# Patient Record
Sex: Female | Born: 1945 | Race: Black or African American | Hispanic: No | Marital: Single | Smoking: Former smoker
Health system: Southern US, Community
[De-identification: ages and names within clinical notes are randomized; demographics above are authoritative.]

## PROBLEM LIST (undated history)

## (undated) DIAGNOSIS — I739 Peripheral vascular disease, unspecified: Secondary | ICD-10-CM

## (undated) DIAGNOSIS — M869 Osteomyelitis, unspecified: Secondary | ICD-10-CM

## (undated) DIAGNOSIS — I10 Essential (primary) hypertension: Secondary | ICD-10-CM

## (undated) DIAGNOSIS — IMO0002 Reserved for concepts with insufficient information to code with codable children: Secondary | ICD-10-CM

## (undated) DIAGNOSIS — Q782 Osteopetrosis: Secondary | ICD-10-CM

## (undated) DIAGNOSIS — M1990 Unspecified osteoarthritis, unspecified site: Secondary | ICD-10-CM

## (undated) HISTORY — PX: SKIN GRAFT: SHX250

## (undated) HISTORY — PX: TOE AMPUTATION: SHX809

## (undated) HISTORY — PX: EYE SURGERY: SHX253

## (undated) HISTORY — DX: Peripheral vascular disease, unspecified: I73.9

## (undated) HISTORY — PX: JOINT REPLACEMENT: SHX530

## (undated) HISTORY — DX: Reserved for concepts with insufficient information to code with codable children: IMO0002

## (undated) HISTORY — DX: Unspecified osteoarthritis, unspecified site: M19.90

---

## 2011-01-25 DIAGNOSIS — I872 Venous insufficiency (chronic) (peripheral): Secondary | ICD-10-CM | POA: Diagnosis not present

## 2011-01-25 DIAGNOSIS — L97309 Non-pressure chronic ulcer of unspecified ankle with unspecified severity: Secondary | ICD-10-CM | POA: Diagnosis not present

## 2011-01-25 DIAGNOSIS — L97509 Non-pressure chronic ulcer of other part of unspecified foot with unspecified severity: Secondary | ICD-10-CM | POA: Diagnosis not present

## 2011-01-25 DIAGNOSIS — I739 Peripheral vascular disease, unspecified: Secondary | ICD-10-CM | POA: Diagnosis not present

## 2011-01-25 DIAGNOSIS — L97209 Non-pressure chronic ulcer of unspecified calf with unspecified severity: Secondary | ICD-10-CM | POA: Diagnosis not present

## 2011-01-27 DIAGNOSIS — I10 Essential (primary) hypertension: Secondary | ICD-10-CM | POA: Diagnosis not present

## 2011-01-27 DIAGNOSIS — M159 Polyosteoarthritis, unspecified: Secondary | ICD-10-CM | POA: Diagnosis not present

## 2011-01-27 DIAGNOSIS — M76899 Other specified enthesopathies of unspecified lower limb, excluding foot: Secondary | ICD-10-CM | POA: Diagnosis not present

## 2011-01-27 DIAGNOSIS — IMO0002 Reserved for concepts with insufficient information to code with codable children: Secondary | ICD-10-CM | POA: Diagnosis not present

## 2011-02-01 DIAGNOSIS — L97309 Non-pressure chronic ulcer of unspecified ankle with unspecified severity: Secondary | ICD-10-CM | POA: Diagnosis not present

## 2011-02-01 DIAGNOSIS — I739 Peripheral vascular disease, unspecified: Secondary | ICD-10-CM | POA: Diagnosis not present

## 2011-02-01 DIAGNOSIS — L97509 Non-pressure chronic ulcer of other part of unspecified foot with unspecified severity: Secondary | ICD-10-CM | POA: Diagnosis not present

## 2011-02-01 DIAGNOSIS — I872 Venous insufficiency (chronic) (peripheral): Secondary | ICD-10-CM | POA: Diagnosis not present

## 2011-02-02 DIAGNOSIS — L97209 Non-pressure chronic ulcer of unspecified calf with unspecified severity: Secondary | ICD-10-CM | POA: Diagnosis not present

## 2011-02-02 DIAGNOSIS — I872 Venous insufficiency (chronic) (peripheral): Secondary | ICD-10-CM | POA: Diagnosis not present

## 2011-02-08 DIAGNOSIS — I872 Venous insufficiency (chronic) (peripheral): Secondary | ICD-10-CM | POA: Diagnosis not present

## 2011-02-08 DIAGNOSIS — L97509 Non-pressure chronic ulcer of other part of unspecified foot with unspecified severity: Secondary | ICD-10-CM | POA: Diagnosis not present

## 2011-02-08 DIAGNOSIS — L97309 Non-pressure chronic ulcer of unspecified ankle with unspecified severity: Secondary | ICD-10-CM | POA: Diagnosis not present

## 2011-02-08 DIAGNOSIS — L97209 Non-pressure chronic ulcer of unspecified calf with unspecified severity: Secondary | ICD-10-CM | POA: Diagnosis not present

## 2011-02-08 DIAGNOSIS — I739 Peripheral vascular disease, unspecified: Secondary | ICD-10-CM | POA: Diagnosis not present

## 2011-02-15 DIAGNOSIS — I739 Peripheral vascular disease, unspecified: Secondary | ICD-10-CM | POA: Diagnosis not present

## 2011-02-15 DIAGNOSIS — L97509 Non-pressure chronic ulcer of other part of unspecified foot with unspecified severity: Secondary | ICD-10-CM | POA: Diagnosis not present

## 2011-02-15 DIAGNOSIS — I872 Venous insufficiency (chronic) (peripheral): Secondary | ICD-10-CM | POA: Diagnosis not present

## 2011-02-15 DIAGNOSIS — L97309 Non-pressure chronic ulcer of unspecified ankle with unspecified severity: Secondary | ICD-10-CM | POA: Diagnosis not present

## 2011-02-15 DIAGNOSIS — L97209 Non-pressure chronic ulcer of unspecified calf with unspecified severity: Secondary | ICD-10-CM | POA: Diagnosis not present

## 2011-02-23 DIAGNOSIS — L97209 Non-pressure chronic ulcer of unspecified calf with unspecified severity: Secondary | ICD-10-CM | POA: Diagnosis not present

## 2011-02-23 DIAGNOSIS — I739 Peripheral vascular disease, unspecified: Secondary | ICD-10-CM | POA: Diagnosis not present

## 2011-02-23 DIAGNOSIS — L97509 Non-pressure chronic ulcer of other part of unspecified foot with unspecified severity: Secondary | ICD-10-CM | POA: Diagnosis not present

## 2011-02-23 DIAGNOSIS — L97309 Non-pressure chronic ulcer of unspecified ankle with unspecified severity: Secondary | ICD-10-CM | POA: Diagnosis not present

## 2011-02-23 DIAGNOSIS — I872 Venous insufficiency (chronic) (peripheral): Secondary | ICD-10-CM | POA: Diagnosis not present

## 2011-02-28 DIAGNOSIS — I872 Venous insufficiency (chronic) (peripheral): Secondary | ICD-10-CM | POA: Diagnosis not present

## 2011-02-28 DIAGNOSIS — A4902 Methicillin resistant Staphylococcus aureus infection, unspecified site: Secondary | ICD-10-CM | POA: Diagnosis not present

## 2011-02-28 DIAGNOSIS — L97509 Non-pressure chronic ulcer of other part of unspecified foot with unspecified severity: Secondary | ICD-10-CM | POA: Diagnosis not present

## 2011-02-28 DIAGNOSIS — L97209 Non-pressure chronic ulcer of unspecified calf with unspecified severity: Secondary | ICD-10-CM | POA: Diagnosis not present

## 2011-02-28 DIAGNOSIS — I739 Peripheral vascular disease, unspecified: Secondary | ICD-10-CM | POA: Diagnosis not present

## 2011-02-28 DIAGNOSIS — L97309 Non-pressure chronic ulcer of unspecified ankle with unspecified severity: Secondary | ICD-10-CM | POA: Diagnosis not present

## 2011-03-09 DIAGNOSIS — A4902 Methicillin resistant Staphylococcus aureus infection, unspecified site: Secondary | ICD-10-CM | POA: Diagnosis not present

## 2011-03-09 DIAGNOSIS — I872 Venous insufficiency (chronic) (peripheral): Secondary | ICD-10-CM | POA: Diagnosis not present

## 2011-03-09 DIAGNOSIS — L97209 Non-pressure chronic ulcer of unspecified calf with unspecified severity: Secondary | ICD-10-CM | POA: Diagnosis not present

## 2011-03-09 DIAGNOSIS — L97509 Non-pressure chronic ulcer of other part of unspecified foot with unspecified severity: Secondary | ICD-10-CM | POA: Diagnosis not present

## 2011-03-09 DIAGNOSIS — L97309 Non-pressure chronic ulcer of unspecified ankle with unspecified severity: Secondary | ICD-10-CM | POA: Diagnosis not present

## 2011-03-09 DIAGNOSIS — I739 Peripheral vascular disease, unspecified: Secondary | ICD-10-CM | POA: Diagnosis not present

## 2011-03-14 DIAGNOSIS — Z79899 Other long term (current) drug therapy: Secondary | ICD-10-CM | POA: Diagnosis not present

## 2011-03-14 DIAGNOSIS — L97509 Non-pressure chronic ulcer of other part of unspecified foot with unspecified severity: Secondary | ICD-10-CM | POA: Diagnosis not present

## 2011-03-14 DIAGNOSIS — A4902 Methicillin resistant Staphylococcus aureus infection, unspecified site: Secondary | ICD-10-CM | POA: Diagnosis not present

## 2011-03-14 DIAGNOSIS — Z880 Allergy status to penicillin: Secondary | ICD-10-CM | POA: Diagnosis not present

## 2011-03-14 DIAGNOSIS — L03119 Cellulitis of unspecified part of limb: Secondary | ICD-10-CM | POA: Diagnosis not present

## 2011-03-14 DIAGNOSIS — L97209 Non-pressure chronic ulcer of unspecified calf with unspecified severity: Secondary | ICD-10-CM | POA: Diagnosis not present

## 2011-03-14 DIAGNOSIS — Z780 Asymptomatic menopausal state: Secondary | ICD-10-CM | POA: Diagnosis not present

## 2011-03-14 DIAGNOSIS — M1710 Unilateral primary osteoarthritis, unspecified knee: Secondary | ICD-10-CM | POA: Diagnosis present

## 2011-03-14 DIAGNOSIS — L988 Other specified disorders of the skin and subcutaneous tissue: Secondary | ICD-10-CM | POA: Diagnosis present

## 2011-03-14 DIAGNOSIS — I872 Venous insufficiency (chronic) (peripheral): Secondary | ICD-10-CM | POA: Diagnosis not present

## 2011-03-14 DIAGNOSIS — L97309 Non-pressure chronic ulcer of unspecified ankle with unspecified severity: Secondary | ICD-10-CM | POA: Diagnosis not present

## 2011-03-14 DIAGNOSIS — K449 Diaphragmatic hernia without obstruction or gangrene: Secondary | ICD-10-CM | POA: Diagnosis present

## 2011-03-14 DIAGNOSIS — M629 Disorder of muscle, unspecified: Secondary | ICD-10-CM | POA: Diagnosis not present

## 2011-03-14 DIAGNOSIS — L678 Other hair color and hair shaft abnormalities: Secondary | ICD-10-CM | POA: Diagnosis present

## 2011-03-14 DIAGNOSIS — I10 Essential (primary) hypertension: Secondary | ICD-10-CM | POA: Diagnosis not present

## 2011-03-14 DIAGNOSIS — L02419 Cutaneous abscess of limb, unspecified: Secondary | ICD-10-CM | POA: Diagnosis not present

## 2011-03-14 DIAGNOSIS — M2420 Disorder of ligament, unspecified site: Secondary | ICD-10-CM | POA: Diagnosis present

## 2011-03-14 DIAGNOSIS — G8929 Other chronic pain: Secondary | ICD-10-CM | POA: Diagnosis present

## 2011-03-14 DIAGNOSIS — L0291 Cutaneous abscess, unspecified: Secondary | ICD-10-CM | POA: Diagnosis not present

## 2011-03-14 DIAGNOSIS — L738 Other specified follicular disorders: Secondary | ICD-10-CM | POA: Diagnosis not present

## 2011-03-14 DIAGNOSIS — Z96649 Presence of unspecified artificial hip joint: Secondary | ICD-10-CM | POA: Diagnosis not present

## 2011-03-14 DIAGNOSIS — I739 Peripheral vascular disease, unspecified: Secondary | ICD-10-CM | POA: Diagnosis not present

## 2011-03-14 DIAGNOSIS — M545 Low back pain: Secondary | ICD-10-CM | POA: Diagnosis present

## 2011-03-14 DIAGNOSIS — S98119A Complete traumatic amputation of unspecified great toe, initial encounter: Secondary | ICD-10-CM | POA: Diagnosis not present

## 2011-03-27 DIAGNOSIS — M76899 Other specified enthesopathies of unspecified lower limb, excluding foot: Secondary | ICD-10-CM | POA: Diagnosis not present

## 2011-03-27 DIAGNOSIS — I10 Essential (primary) hypertension: Secondary | ICD-10-CM | POA: Diagnosis not present

## 2011-03-27 DIAGNOSIS — H8110 Benign paroxysmal vertigo, unspecified ear: Secondary | ICD-10-CM | POA: Diagnosis not present

## 2011-03-27 DIAGNOSIS — R609 Edema, unspecified: Secondary | ICD-10-CM | POA: Diagnosis not present

## 2011-03-27 DIAGNOSIS — M159 Polyosteoarthritis, unspecified: Secondary | ICD-10-CM | POA: Diagnosis not present

## 2011-03-28 DIAGNOSIS — L97509 Non-pressure chronic ulcer of other part of unspecified foot with unspecified severity: Secondary | ICD-10-CM | POA: Diagnosis not present

## 2011-03-28 DIAGNOSIS — I739 Peripheral vascular disease, unspecified: Secondary | ICD-10-CM | POA: Diagnosis not present

## 2011-03-28 DIAGNOSIS — L97409 Non-pressure chronic ulcer of unspecified heel and midfoot with unspecified severity: Secondary | ICD-10-CM | POA: Diagnosis not present

## 2011-03-28 DIAGNOSIS — I872 Venous insufficiency (chronic) (peripheral): Secondary | ICD-10-CM | POA: Diagnosis not present

## 2011-03-28 DIAGNOSIS — L97209 Non-pressure chronic ulcer of unspecified calf with unspecified severity: Secondary | ICD-10-CM | POA: Diagnosis not present

## 2011-03-28 DIAGNOSIS — L02419 Cutaneous abscess of limb, unspecified: Secondary | ICD-10-CM | POA: Diagnosis not present

## 2011-03-28 DIAGNOSIS — L97309 Non-pressure chronic ulcer of unspecified ankle with unspecified severity: Secondary | ICD-10-CM | POA: Diagnosis not present

## 2011-04-03 DIAGNOSIS — L97509 Non-pressure chronic ulcer of other part of unspecified foot with unspecified severity: Secondary | ICD-10-CM | POA: Diagnosis not present

## 2011-04-03 DIAGNOSIS — L97309 Non-pressure chronic ulcer of unspecified ankle with unspecified severity: Secondary | ICD-10-CM | POA: Diagnosis not present

## 2011-04-03 DIAGNOSIS — L97209 Non-pressure chronic ulcer of unspecified calf with unspecified severity: Secondary | ICD-10-CM | POA: Diagnosis not present

## 2011-04-03 DIAGNOSIS — I739 Peripheral vascular disease, unspecified: Secondary | ICD-10-CM | POA: Diagnosis not present

## 2011-04-03 DIAGNOSIS — I872 Venous insufficiency (chronic) (peripheral): Secondary | ICD-10-CM | POA: Diagnosis not present

## 2011-04-07 DIAGNOSIS — M79609 Pain in unspecified limb: Secondary | ICD-10-CM | POA: Diagnosis not present

## 2011-04-07 DIAGNOSIS — M898X9 Other specified disorders of bone, unspecified site: Secondary | ICD-10-CM | POA: Diagnosis not present

## 2011-04-10 DIAGNOSIS — L97309 Non-pressure chronic ulcer of unspecified ankle with unspecified severity: Secondary | ICD-10-CM | POA: Diagnosis not present

## 2011-04-10 DIAGNOSIS — I872 Venous insufficiency (chronic) (peripheral): Secondary | ICD-10-CM | POA: Diagnosis not present

## 2011-04-10 DIAGNOSIS — L97209 Non-pressure chronic ulcer of unspecified calf with unspecified severity: Secondary | ICD-10-CM | POA: Diagnosis not present

## 2011-04-10 DIAGNOSIS — I739 Peripheral vascular disease, unspecified: Secondary | ICD-10-CM | POA: Diagnosis not present

## 2011-04-10 DIAGNOSIS — L97509 Non-pressure chronic ulcer of other part of unspecified foot with unspecified severity: Secondary | ICD-10-CM | POA: Diagnosis not present

## 2011-04-10 DIAGNOSIS — L97409 Non-pressure chronic ulcer of unspecified heel and midfoot with unspecified severity: Secondary | ICD-10-CM | POA: Diagnosis not present

## 2011-04-17 DIAGNOSIS — L97309 Non-pressure chronic ulcer of unspecified ankle with unspecified severity: Secondary | ICD-10-CM | POA: Diagnosis not present

## 2011-04-17 DIAGNOSIS — L97509 Non-pressure chronic ulcer of other part of unspecified foot with unspecified severity: Secondary | ICD-10-CM | POA: Diagnosis not present

## 2011-04-17 DIAGNOSIS — I872 Venous insufficiency (chronic) (peripheral): Secondary | ICD-10-CM | POA: Diagnosis not present

## 2011-04-17 DIAGNOSIS — I739 Peripheral vascular disease, unspecified: Secondary | ICD-10-CM | POA: Diagnosis not present

## 2011-04-17 DIAGNOSIS — L97209 Non-pressure chronic ulcer of unspecified calf with unspecified severity: Secondary | ICD-10-CM | POA: Diagnosis not present

## 2011-04-21 DIAGNOSIS — L97209 Non-pressure chronic ulcer of unspecified calf with unspecified severity: Secondary | ICD-10-CM | POA: Diagnosis not present

## 2011-04-21 DIAGNOSIS — L97509 Non-pressure chronic ulcer of other part of unspecified foot with unspecified severity: Secondary | ICD-10-CM | POA: Diagnosis not present

## 2011-04-21 DIAGNOSIS — I739 Peripheral vascular disease, unspecified: Secondary | ICD-10-CM | POA: Diagnosis not present

## 2011-04-21 DIAGNOSIS — I872 Venous insufficiency (chronic) (peripheral): Secondary | ICD-10-CM | POA: Diagnosis not present

## 2011-04-21 DIAGNOSIS — L97309 Non-pressure chronic ulcer of unspecified ankle with unspecified severity: Secondary | ICD-10-CM | POA: Diagnosis not present

## 2011-04-27 DIAGNOSIS — K529 Noninfective gastroenteritis and colitis, unspecified: Secondary | ICD-10-CM | POA: Diagnosis not present

## 2011-05-01 DIAGNOSIS — L97509 Non-pressure chronic ulcer of other part of unspecified foot with unspecified severity: Secondary | ICD-10-CM | POA: Diagnosis not present

## 2011-05-01 DIAGNOSIS — I739 Peripheral vascular disease, unspecified: Secondary | ICD-10-CM | POA: Diagnosis not present

## 2011-05-01 DIAGNOSIS — I872 Venous insufficiency (chronic) (peripheral): Secondary | ICD-10-CM | POA: Diagnosis not present

## 2011-05-01 DIAGNOSIS — L97409 Non-pressure chronic ulcer of unspecified heel and midfoot with unspecified severity: Secondary | ICD-10-CM | POA: Diagnosis not present

## 2011-05-01 DIAGNOSIS — L97209 Non-pressure chronic ulcer of unspecified calf with unspecified severity: Secondary | ICD-10-CM | POA: Diagnosis not present

## 2011-05-01 DIAGNOSIS — L97309 Non-pressure chronic ulcer of unspecified ankle with unspecified severity: Secondary | ICD-10-CM | POA: Diagnosis not present

## 2011-05-08 DIAGNOSIS — L97509 Non-pressure chronic ulcer of other part of unspecified foot with unspecified severity: Secondary | ICD-10-CM | POA: Diagnosis not present

## 2011-05-08 DIAGNOSIS — L97309 Non-pressure chronic ulcer of unspecified ankle with unspecified severity: Secondary | ICD-10-CM | POA: Diagnosis not present

## 2011-05-08 DIAGNOSIS — I872 Venous insufficiency (chronic) (peripheral): Secondary | ICD-10-CM | POA: Diagnosis not present

## 2011-05-08 DIAGNOSIS — I739 Peripheral vascular disease, unspecified: Secondary | ICD-10-CM | POA: Diagnosis not present

## 2011-05-08 DIAGNOSIS — L97209 Non-pressure chronic ulcer of unspecified calf with unspecified severity: Secondary | ICD-10-CM | POA: Diagnosis not present

## 2011-05-15 DIAGNOSIS — I739 Peripheral vascular disease, unspecified: Secondary | ICD-10-CM | POA: Diagnosis not present

## 2011-05-15 DIAGNOSIS — L97509 Non-pressure chronic ulcer of other part of unspecified foot with unspecified severity: Secondary | ICD-10-CM | POA: Diagnosis not present

## 2011-05-15 DIAGNOSIS — L97309 Non-pressure chronic ulcer of unspecified ankle with unspecified severity: Secondary | ICD-10-CM | POA: Diagnosis not present

## 2011-05-15 DIAGNOSIS — L97209 Non-pressure chronic ulcer of unspecified calf with unspecified severity: Secondary | ICD-10-CM | POA: Diagnosis not present

## 2011-05-15 DIAGNOSIS — I872 Venous insufficiency (chronic) (peripheral): Secondary | ICD-10-CM | POA: Diagnosis not present

## 2011-05-22 DIAGNOSIS — I872 Venous insufficiency (chronic) (peripheral): Secondary | ICD-10-CM | POA: Diagnosis not present

## 2011-05-22 DIAGNOSIS — L97209 Non-pressure chronic ulcer of unspecified calf with unspecified severity: Secondary | ICD-10-CM | POA: Diagnosis not present

## 2011-05-22 DIAGNOSIS — L97509 Non-pressure chronic ulcer of other part of unspecified foot with unspecified severity: Secondary | ICD-10-CM | POA: Diagnosis not present

## 2011-05-22 DIAGNOSIS — I739 Peripheral vascular disease, unspecified: Secondary | ICD-10-CM | POA: Diagnosis not present

## 2011-05-22 DIAGNOSIS — L97409 Non-pressure chronic ulcer of unspecified heel and midfoot with unspecified severity: Secondary | ICD-10-CM | POA: Diagnosis not present

## 2011-05-22 DIAGNOSIS — L97309 Non-pressure chronic ulcer of unspecified ankle with unspecified severity: Secondary | ICD-10-CM | POA: Diagnosis not present

## 2011-05-26 DIAGNOSIS — Z1231 Encounter for screening mammogram for malignant neoplasm of breast: Secondary | ICD-10-CM | POA: Diagnosis not present

## 2011-05-31 DIAGNOSIS — L97209 Non-pressure chronic ulcer of unspecified calf with unspecified severity: Secondary | ICD-10-CM | POA: Diagnosis not present

## 2011-05-31 DIAGNOSIS — I872 Venous insufficiency (chronic) (peripheral): Secondary | ICD-10-CM | POA: Diagnosis not present

## 2011-05-31 DIAGNOSIS — I739 Peripheral vascular disease, unspecified: Secondary | ICD-10-CM | POA: Diagnosis not present

## 2011-05-31 DIAGNOSIS — L97409 Non-pressure chronic ulcer of unspecified heel and midfoot with unspecified severity: Secondary | ICD-10-CM | POA: Diagnosis not present

## 2011-05-31 DIAGNOSIS — L97309 Non-pressure chronic ulcer of unspecified ankle with unspecified severity: Secondary | ICD-10-CM | POA: Diagnosis not present

## 2011-05-31 DIAGNOSIS — L97509 Non-pressure chronic ulcer of other part of unspecified foot with unspecified severity: Secondary | ICD-10-CM | POA: Diagnosis not present

## 2011-06-05 DIAGNOSIS — L97309 Non-pressure chronic ulcer of unspecified ankle with unspecified severity: Secondary | ICD-10-CM | POA: Diagnosis not present

## 2011-06-05 DIAGNOSIS — I872 Venous insufficiency (chronic) (peripheral): Secondary | ICD-10-CM | POA: Diagnosis not present

## 2011-06-05 DIAGNOSIS — I739 Peripheral vascular disease, unspecified: Secondary | ICD-10-CM | POA: Diagnosis not present

## 2011-06-05 DIAGNOSIS — L97409 Non-pressure chronic ulcer of unspecified heel and midfoot with unspecified severity: Secondary | ICD-10-CM | POA: Diagnosis not present

## 2011-06-05 DIAGNOSIS — L97509 Non-pressure chronic ulcer of other part of unspecified foot with unspecified severity: Secondary | ICD-10-CM | POA: Diagnosis not present

## 2011-06-05 DIAGNOSIS — L97209 Non-pressure chronic ulcer of unspecified calf with unspecified severity: Secondary | ICD-10-CM | POA: Diagnosis not present

## 2011-06-12 DIAGNOSIS — L97309 Non-pressure chronic ulcer of unspecified ankle with unspecified severity: Secondary | ICD-10-CM | POA: Diagnosis not present

## 2011-06-12 DIAGNOSIS — I872 Venous insufficiency (chronic) (peripheral): Secondary | ICD-10-CM | POA: Diagnosis not present

## 2011-06-12 DIAGNOSIS — I739 Peripheral vascular disease, unspecified: Secondary | ICD-10-CM | POA: Diagnosis not present

## 2011-06-12 DIAGNOSIS — L97209 Non-pressure chronic ulcer of unspecified calf with unspecified severity: Secondary | ICD-10-CM | POA: Diagnosis not present

## 2011-06-12 DIAGNOSIS — L97509 Non-pressure chronic ulcer of other part of unspecified foot with unspecified severity: Secondary | ICD-10-CM | POA: Diagnosis not present

## 2011-06-13 DIAGNOSIS — L97209 Non-pressure chronic ulcer of unspecified calf with unspecified severity: Secondary | ICD-10-CM | POA: Diagnosis not present

## 2011-06-13 DIAGNOSIS — I872 Venous insufficiency (chronic) (peripheral): Secondary | ICD-10-CM | POA: Diagnosis not present

## 2011-06-20 DIAGNOSIS — L03119 Cellulitis of unspecified part of limb: Secondary | ICD-10-CM | POA: Diagnosis not present

## 2011-06-20 DIAGNOSIS — L97309 Non-pressure chronic ulcer of unspecified ankle with unspecified severity: Secondary | ICD-10-CM | POA: Diagnosis not present

## 2011-06-20 DIAGNOSIS — I739 Peripheral vascular disease, unspecified: Secondary | ICD-10-CM | POA: Diagnosis not present

## 2011-06-20 DIAGNOSIS — L97209 Non-pressure chronic ulcer of unspecified calf with unspecified severity: Secondary | ICD-10-CM | POA: Diagnosis not present

## 2011-06-20 DIAGNOSIS — L97509 Non-pressure chronic ulcer of other part of unspecified foot with unspecified severity: Secondary | ICD-10-CM | POA: Diagnosis not present

## 2011-06-20 DIAGNOSIS — I872 Venous insufficiency (chronic) (peripheral): Secondary | ICD-10-CM | POA: Diagnosis not present

## 2011-06-21 DIAGNOSIS — L03119 Cellulitis of unspecified part of limb: Secondary | ICD-10-CM | POA: Diagnosis not present

## 2011-06-21 DIAGNOSIS — L02419 Cutaneous abscess of limb, unspecified: Secondary | ICD-10-CM | POA: Diagnosis not present

## 2011-06-23 DIAGNOSIS — H2510 Age-related nuclear cataract, unspecified eye: Secondary | ICD-10-CM | POA: Diagnosis not present

## 2011-06-23 DIAGNOSIS — H0289 Other specified disorders of eyelid: Secondary | ICD-10-CM | POA: Diagnosis not present

## 2011-06-26 DIAGNOSIS — L02419 Cutaneous abscess of limb, unspecified: Secondary | ICD-10-CM | POA: Diagnosis not present

## 2011-06-26 DIAGNOSIS — I872 Venous insufficiency (chronic) (peripheral): Secondary | ICD-10-CM | POA: Diagnosis not present

## 2011-06-26 DIAGNOSIS — L03119 Cellulitis of unspecified part of limb: Secondary | ICD-10-CM | POA: Diagnosis not present

## 2011-06-26 DIAGNOSIS — L97309 Non-pressure chronic ulcer of unspecified ankle with unspecified severity: Secondary | ICD-10-CM | POA: Diagnosis not present

## 2011-06-26 DIAGNOSIS — L97409 Non-pressure chronic ulcer of unspecified heel and midfoot with unspecified severity: Secondary | ICD-10-CM | POA: Diagnosis not present

## 2011-06-26 DIAGNOSIS — L97209 Non-pressure chronic ulcer of unspecified calf with unspecified severity: Secondary | ICD-10-CM | POA: Diagnosis not present

## 2011-06-26 DIAGNOSIS — I739 Peripheral vascular disease, unspecified: Secondary | ICD-10-CM | POA: Diagnosis not present

## 2011-06-26 DIAGNOSIS — L97509 Non-pressure chronic ulcer of other part of unspecified foot with unspecified severity: Secondary | ICD-10-CM | POA: Diagnosis not present

## 2011-07-02 DIAGNOSIS — L97509 Non-pressure chronic ulcer of other part of unspecified foot with unspecified severity: Secondary | ICD-10-CM | POA: Diagnosis present

## 2011-07-02 DIAGNOSIS — M545 Low back pain: Secondary | ICD-10-CM | POA: Diagnosis not present

## 2011-07-02 DIAGNOSIS — G8929 Other chronic pain: Secondary | ICD-10-CM | POA: Diagnosis present

## 2011-07-02 DIAGNOSIS — N179 Acute kidney failure, unspecified: Secondary | ICD-10-CM | POA: Diagnosis not present

## 2011-07-02 DIAGNOSIS — Z79899 Other long term (current) drug therapy: Secondary | ICD-10-CM | POA: Diagnosis not present

## 2011-07-02 DIAGNOSIS — I10 Essential (primary) hypertension: Secondary | ICD-10-CM | POA: Diagnosis not present

## 2011-07-02 DIAGNOSIS — R51 Headache: Secondary | ICD-10-CM | POA: Diagnosis not present

## 2011-07-02 DIAGNOSIS — E860 Dehydration: Secondary | ICD-10-CM | POA: Diagnosis not present

## 2011-07-02 DIAGNOSIS — Z880 Allergy status to penicillin: Secondary | ICD-10-CM | POA: Diagnosis not present

## 2011-07-02 DIAGNOSIS — Z886 Allergy status to analgesic agent status: Secondary | ICD-10-CM | POA: Diagnosis not present

## 2011-07-02 DIAGNOSIS — J984 Other disorders of lung: Secondary | ICD-10-CM | POA: Diagnosis not present

## 2011-07-02 DIAGNOSIS — I872 Venous insufficiency (chronic) (peripheral): Secondary | ICD-10-CM | POA: Diagnosis present

## 2011-07-02 DIAGNOSIS — L97809 Non-pressure chronic ulcer of other part of unspecified lower leg with unspecified severity: Secondary | ICD-10-CM | POA: Diagnosis not present

## 2011-07-02 DIAGNOSIS — R9389 Abnormal findings on diagnostic imaging of other specified body structures: Secondary | ICD-10-CM | POA: Diagnosis not present

## 2011-07-02 DIAGNOSIS — L89509 Pressure ulcer of unspecified ankle, unspecified stage: Secondary | ICD-10-CM | POA: Diagnosis present

## 2011-07-02 DIAGNOSIS — L8993 Pressure ulcer of unspecified site, stage 3: Secondary | ICD-10-CM | POA: Diagnosis not present

## 2011-07-02 DIAGNOSIS — M542 Cervicalgia: Secondary | ICD-10-CM | POA: Diagnosis not present

## 2011-07-02 DIAGNOSIS — Z8614 Personal history of Methicillin resistant Staphylococcus aureus infection: Secondary | ICD-10-CM | POA: Diagnosis not present

## 2011-07-07 DIAGNOSIS — I739 Peripheral vascular disease, unspecified: Secondary | ICD-10-CM | POA: Diagnosis not present

## 2011-07-07 DIAGNOSIS — L97309 Non-pressure chronic ulcer of unspecified ankle with unspecified severity: Secondary | ICD-10-CM | POA: Diagnosis not present

## 2011-07-07 DIAGNOSIS — L97209 Non-pressure chronic ulcer of unspecified calf with unspecified severity: Secondary | ICD-10-CM | POA: Diagnosis not present

## 2011-07-07 DIAGNOSIS — I872 Venous insufficiency (chronic) (peripheral): Secondary | ICD-10-CM | POA: Diagnosis not present

## 2011-07-07 DIAGNOSIS — L97509 Non-pressure chronic ulcer of other part of unspecified foot with unspecified severity: Secondary | ICD-10-CM | POA: Diagnosis not present

## 2011-07-07 DIAGNOSIS — L97409 Non-pressure chronic ulcer of unspecified heel and midfoot with unspecified severity: Secondary | ICD-10-CM | POA: Diagnosis not present

## 2011-07-08 DIAGNOSIS — M6281 Muscle weakness (generalized): Secondary | ICD-10-CM | POA: Diagnosis not present

## 2011-07-08 DIAGNOSIS — I10 Essential (primary) hypertension: Secondary | ICD-10-CM | POA: Diagnosis not present

## 2011-07-08 DIAGNOSIS — Z8614 Personal history of Methicillin resistant Staphylococcus aureus infection: Secondary | ICD-10-CM | POA: Diagnosis not present

## 2011-07-08 DIAGNOSIS — N179 Acute kidney failure, unspecified: Secondary | ICD-10-CM | POA: Diagnosis not present

## 2011-07-08 DIAGNOSIS — L97809 Non-pressure chronic ulcer of other part of unspecified lower leg with unspecified severity: Secondary | ICD-10-CM | POA: Diagnosis not present

## 2011-07-10 DIAGNOSIS — N179 Acute kidney failure, unspecified: Secondary | ICD-10-CM | POA: Diagnosis not present

## 2011-07-10 DIAGNOSIS — M6281 Muscle weakness (generalized): Secondary | ICD-10-CM | POA: Diagnosis not present

## 2011-07-10 DIAGNOSIS — L97809 Non-pressure chronic ulcer of other part of unspecified lower leg with unspecified severity: Secondary | ICD-10-CM | POA: Diagnosis not present

## 2011-07-10 DIAGNOSIS — I10 Essential (primary) hypertension: Secondary | ICD-10-CM | POA: Diagnosis not present

## 2011-07-10 DIAGNOSIS — Z8614 Personal history of Methicillin resistant Staphylococcus aureus infection: Secondary | ICD-10-CM | POA: Diagnosis not present

## 2011-07-13 DIAGNOSIS — M6281 Muscle weakness (generalized): Secondary | ICD-10-CM | POA: Diagnosis not present

## 2011-07-13 DIAGNOSIS — N179 Acute kidney failure, unspecified: Secondary | ICD-10-CM | POA: Diagnosis not present

## 2011-07-13 DIAGNOSIS — Z8614 Personal history of Methicillin resistant Staphylococcus aureus infection: Secondary | ICD-10-CM | POA: Diagnosis not present

## 2011-07-13 DIAGNOSIS — L97809 Non-pressure chronic ulcer of other part of unspecified lower leg with unspecified severity: Secondary | ICD-10-CM | POA: Diagnosis not present

## 2011-07-13 DIAGNOSIS — I10 Essential (primary) hypertension: Secondary | ICD-10-CM | POA: Diagnosis not present

## 2011-07-14 DIAGNOSIS — L97409 Non-pressure chronic ulcer of unspecified heel and midfoot with unspecified severity: Secondary | ICD-10-CM | POA: Diagnosis not present

## 2011-07-14 DIAGNOSIS — I739 Peripheral vascular disease, unspecified: Secondary | ICD-10-CM | POA: Diagnosis not present

## 2011-07-14 DIAGNOSIS — I872 Venous insufficiency (chronic) (peripheral): Secondary | ICD-10-CM | POA: Diagnosis not present

## 2011-07-14 DIAGNOSIS — L97509 Non-pressure chronic ulcer of other part of unspecified foot with unspecified severity: Secondary | ICD-10-CM | POA: Diagnosis not present

## 2011-07-14 DIAGNOSIS — L97209 Non-pressure chronic ulcer of unspecified calf with unspecified severity: Secondary | ICD-10-CM | POA: Diagnosis not present

## 2011-07-14 DIAGNOSIS — L97309 Non-pressure chronic ulcer of unspecified ankle with unspecified severity: Secondary | ICD-10-CM | POA: Diagnosis not present

## 2011-07-15 DIAGNOSIS — I10 Essential (primary) hypertension: Secondary | ICD-10-CM | POA: Diagnosis not present

## 2011-07-15 DIAGNOSIS — L97809 Non-pressure chronic ulcer of other part of unspecified lower leg with unspecified severity: Secondary | ICD-10-CM | POA: Diagnosis not present

## 2011-07-15 DIAGNOSIS — M6281 Muscle weakness (generalized): Secondary | ICD-10-CM | POA: Diagnosis not present

## 2011-07-15 DIAGNOSIS — Z8614 Personal history of Methicillin resistant Staphylococcus aureus infection: Secondary | ICD-10-CM | POA: Diagnosis not present

## 2011-07-15 DIAGNOSIS — N179 Acute kidney failure, unspecified: Secondary | ICD-10-CM | POA: Diagnosis not present

## 2011-07-17 DIAGNOSIS — M6281 Muscle weakness (generalized): Secondary | ICD-10-CM | POA: Diagnosis not present

## 2011-07-17 DIAGNOSIS — Z8614 Personal history of Methicillin resistant Staphylococcus aureus infection: Secondary | ICD-10-CM | POA: Diagnosis not present

## 2011-07-17 DIAGNOSIS — I10 Essential (primary) hypertension: Secondary | ICD-10-CM | POA: Diagnosis not present

## 2011-07-17 DIAGNOSIS — N179 Acute kidney failure, unspecified: Secondary | ICD-10-CM | POA: Diagnosis not present

## 2011-07-17 DIAGNOSIS — L97809 Non-pressure chronic ulcer of other part of unspecified lower leg with unspecified severity: Secondary | ICD-10-CM | POA: Diagnosis not present

## 2011-07-20 DIAGNOSIS — L97809 Non-pressure chronic ulcer of other part of unspecified lower leg with unspecified severity: Secondary | ICD-10-CM | POA: Diagnosis not present

## 2011-07-20 DIAGNOSIS — M6281 Muscle weakness (generalized): Secondary | ICD-10-CM | POA: Diagnosis not present

## 2011-07-20 DIAGNOSIS — I10 Essential (primary) hypertension: Secondary | ICD-10-CM | POA: Diagnosis not present

## 2011-07-20 DIAGNOSIS — Z8614 Personal history of Methicillin resistant Staphylococcus aureus infection: Secondary | ICD-10-CM | POA: Diagnosis not present

## 2011-07-20 DIAGNOSIS — N179 Acute kidney failure, unspecified: Secondary | ICD-10-CM | POA: Diagnosis not present

## 2011-07-21 DIAGNOSIS — N179 Acute kidney failure, unspecified: Secondary | ICD-10-CM | POA: Diagnosis not present

## 2011-07-21 DIAGNOSIS — M6281 Muscle weakness (generalized): Secondary | ICD-10-CM | POA: Diagnosis not present

## 2011-07-21 DIAGNOSIS — L97809 Non-pressure chronic ulcer of other part of unspecified lower leg with unspecified severity: Secondary | ICD-10-CM | POA: Diagnosis not present

## 2011-07-21 DIAGNOSIS — I10 Essential (primary) hypertension: Secondary | ICD-10-CM | POA: Diagnosis not present

## 2011-07-21 DIAGNOSIS — Z8614 Personal history of Methicillin resistant Staphylococcus aureus infection: Secondary | ICD-10-CM | POA: Diagnosis not present

## 2011-07-23 DIAGNOSIS — N179 Acute kidney failure, unspecified: Secondary | ICD-10-CM | POA: Diagnosis not present

## 2011-07-23 DIAGNOSIS — E860 Dehydration: Secondary | ICD-10-CM | POA: Diagnosis not present

## 2011-07-23 DIAGNOSIS — Z880 Allergy status to penicillin: Secondary | ICD-10-CM | POA: Diagnosis not present

## 2011-07-23 DIAGNOSIS — I10 Essential (primary) hypertension: Secondary | ICD-10-CM | POA: Diagnosis present

## 2011-07-23 DIAGNOSIS — L97909 Non-pressure chronic ulcer of unspecified part of unspecified lower leg with unspecified severity: Secondary | ICD-10-CM | POA: Diagnosis not present

## 2011-07-23 DIAGNOSIS — A419 Sepsis, unspecified organism: Secondary | ICD-10-CM | POA: Diagnosis not present

## 2011-07-23 DIAGNOSIS — R5383 Other fatigue: Secondary | ICD-10-CM | POA: Diagnosis not present

## 2011-07-23 DIAGNOSIS — R42 Dizziness and giddiness: Secondary | ICD-10-CM | POA: Diagnosis not present

## 2011-07-23 DIAGNOSIS — A4159 Other Gram-negative sepsis: Secondary | ICD-10-CM | POA: Diagnosis not present

## 2011-07-23 DIAGNOSIS — M545 Low back pain: Secondary | ICD-10-CM | POA: Diagnosis present

## 2011-07-23 DIAGNOSIS — Z886 Allergy status to analgesic agent status: Secondary | ICD-10-CM | POA: Diagnosis not present

## 2011-07-23 DIAGNOSIS — Z2821 Immunization not carried out because of patient refusal: Secondary | ICD-10-CM | POA: Diagnosis not present

## 2011-07-23 DIAGNOSIS — A4151 Sepsis due to Escherichia coli [E. coli]: Secondary | ICD-10-CM | POA: Diagnosis not present

## 2011-07-23 DIAGNOSIS — R5381 Other malaise: Secondary | ICD-10-CM | POA: Diagnosis not present

## 2011-07-23 DIAGNOSIS — G8929 Other chronic pain: Secondary | ICD-10-CM | POA: Diagnosis present

## 2011-07-23 DIAGNOSIS — I872 Venous insufficiency (chronic) (peripheral): Secondary | ICD-10-CM | POA: Diagnosis present

## 2011-07-23 DIAGNOSIS — N390 Urinary tract infection, site not specified: Secondary | ICD-10-CM | POA: Diagnosis not present

## 2011-07-23 DIAGNOSIS — N289 Disorder of kidney and ureter, unspecified: Secondary | ICD-10-CM | POA: Diagnosis not present

## 2011-07-23 DIAGNOSIS — Z79899 Other long term (current) drug therapy: Secondary | ICD-10-CM | POA: Diagnosis not present

## 2011-07-29 DIAGNOSIS — L97909 Non-pressure chronic ulcer of unspecified part of unspecified lower leg with unspecified severity: Secondary | ICD-10-CM | POA: Diagnosis not present

## 2011-07-29 DIAGNOSIS — E860 Dehydration: Secondary | ICD-10-CM | POA: Diagnosis not present

## 2011-07-29 DIAGNOSIS — I83009 Varicose veins of unspecified lower extremity with ulcer of unspecified site: Secondary | ICD-10-CM | POA: Diagnosis not present

## 2011-07-29 DIAGNOSIS — A419 Sepsis, unspecified organism: Secondary | ICD-10-CM | POA: Diagnosis not present

## 2011-07-29 DIAGNOSIS — N390 Urinary tract infection, site not specified: Secondary | ICD-10-CM | POA: Diagnosis not present

## 2011-07-29 DIAGNOSIS — R269 Unspecified abnormalities of gait and mobility: Secondary | ICD-10-CM | POA: Diagnosis not present

## 2011-07-29 DIAGNOSIS — N289 Disorder of kidney and ureter, unspecified: Secondary | ICD-10-CM | POA: Diagnosis not present

## 2011-07-31 DIAGNOSIS — L97309 Non-pressure chronic ulcer of unspecified ankle with unspecified severity: Secondary | ICD-10-CM | POA: Diagnosis not present

## 2011-07-31 DIAGNOSIS — I872 Venous insufficiency (chronic) (peripheral): Secondary | ICD-10-CM | POA: Diagnosis not present

## 2011-07-31 DIAGNOSIS — L97209 Non-pressure chronic ulcer of unspecified calf with unspecified severity: Secondary | ICD-10-CM | POA: Diagnosis not present

## 2011-07-31 DIAGNOSIS — I739 Peripheral vascular disease, unspecified: Secondary | ICD-10-CM | POA: Diagnosis not present

## 2011-07-31 DIAGNOSIS — L97509 Non-pressure chronic ulcer of other part of unspecified foot with unspecified severity: Secondary | ICD-10-CM | POA: Diagnosis not present

## 2011-08-01 DIAGNOSIS — A419 Sepsis, unspecified organism: Secondary | ICD-10-CM | POA: Diagnosis not present

## 2011-08-01 DIAGNOSIS — L97909 Non-pressure chronic ulcer of unspecified part of unspecified lower leg with unspecified severity: Secondary | ICD-10-CM | POA: Diagnosis not present

## 2011-08-01 DIAGNOSIS — E860 Dehydration: Secondary | ICD-10-CM | POA: Diagnosis not present

## 2011-08-01 DIAGNOSIS — N390 Urinary tract infection, site not specified: Secondary | ICD-10-CM | POA: Diagnosis not present

## 2011-08-01 DIAGNOSIS — N289 Disorder of kidney and ureter, unspecified: Secondary | ICD-10-CM | POA: Diagnosis not present

## 2011-08-01 DIAGNOSIS — R269 Unspecified abnormalities of gait and mobility: Secondary | ICD-10-CM | POA: Diagnosis not present

## 2011-08-03 DIAGNOSIS — N289 Disorder of kidney and ureter, unspecified: Secondary | ICD-10-CM | POA: Diagnosis not present

## 2011-08-03 DIAGNOSIS — I83009 Varicose veins of unspecified lower extremity with ulcer of unspecified site: Secondary | ICD-10-CM | POA: Diagnosis not present

## 2011-08-03 DIAGNOSIS — A419 Sepsis, unspecified organism: Secondary | ICD-10-CM | POA: Diagnosis not present

## 2011-08-03 DIAGNOSIS — N390 Urinary tract infection, site not specified: Secondary | ICD-10-CM | POA: Diagnosis not present

## 2011-08-03 DIAGNOSIS — E860 Dehydration: Secondary | ICD-10-CM | POA: Diagnosis not present

## 2011-08-03 DIAGNOSIS — R269 Unspecified abnormalities of gait and mobility: Secondary | ICD-10-CM | POA: Diagnosis not present

## 2011-08-07 DIAGNOSIS — I872 Venous insufficiency (chronic) (peripheral): Secondary | ICD-10-CM | POA: Diagnosis not present

## 2011-08-07 DIAGNOSIS — L97209 Non-pressure chronic ulcer of unspecified calf with unspecified severity: Secondary | ICD-10-CM | POA: Diagnosis not present

## 2011-08-07 DIAGNOSIS — L97309 Non-pressure chronic ulcer of unspecified ankle with unspecified severity: Secondary | ICD-10-CM | POA: Diagnosis not present

## 2011-08-07 DIAGNOSIS — L97409 Non-pressure chronic ulcer of unspecified heel and midfoot with unspecified severity: Secondary | ICD-10-CM | POA: Diagnosis not present

## 2011-08-07 DIAGNOSIS — I739 Peripheral vascular disease, unspecified: Secondary | ICD-10-CM | POA: Diagnosis not present

## 2011-08-07 DIAGNOSIS — L97509 Non-pressure chronic ulcer of other part of unspecified foot with unspecified severity: Secondary | ICD-10-CM | POA: Diagnosis not present

## 2011-08-08 DIAGNOSIS — N390 Urinary tract infection, site not specified: Secondary | ICD-10-CM | POA: Diagnosis not present

## 2011-08-08 DIAGNOSIS — E860 Dehydration: Secondary | ICD-10-CM | POA: Diagnosis not present

## 2011-08-08 DIAGNOSIS — I83009 Varicose veins of unspecified lower extremity with ulcer of unspecified site: Secondary | ICD-10-CM | POA: Diagnosis not present

## 2011-08-08 DIAGNOSIS — A419 Sepsis, unspecified organism: Secondary | ICD-10-CM | POA: Diagnosis not present

## 2011-08-08 DIAGNOSIS — N289 Disorder of kidney and ureter, unspecified: Secondary | ICD-10-CM | POA: Diagnosis not present

## 2011-08-08 DIAGNOSIS — R269 Unspecified abnormalities of gait and mobility: Secondary | ICD-10-CM | POA: Diagnosis not present

## 2011-08-10 DIAGNOSIS — N19 Unspecified kidney failure: Secondary | ICD-10-CM | POA: Diagnosis not present

## 2011-08-10 DIAGNOSIS — L97909 Non-pressure chronic ulcer of unspecified part of unspecified lower leg with unspecified severity: Secondary | ICD-10-CM | POA: Diagnosis not present

## 2011-08-10 DIAGNOSIS — A419 Sepsis, unspecified organism: Secondary | ICD-10-CM | POA: Diagnosis not present

## 2011-08-10 DIAGNOSIS — N289 Disorder of kidney and ureter, unspecified: Secondary | ICD-10-CM | POA: Diagnosis not present

## 2011-08-10 DIAGNOSIS — Z7901 Long term (current) use of anticoagulants: Secondary | ICD-10-CM | POA: Diagnosis not present

## 2011-08-10 DIAGNOSIS — N390 Urinary tract infection, site not specified: Secondary | ICD-10-CM | POA: Diagnosis not present

## 2011-08-10 DIAGNOSIS — R269 Unspecified abnormalities of gait and mobility: Secondary | ICD-10-CM | POA: Diagnosis not present

## 2011-08-10 DIAGNOSIS — E860 Dehydration: Secondary | ICD-10-CM | POA: Diagnosis not present

## 2011-08-14 DIAGNOSIS — L97509 Non-pressure chronic ulcer of other part of unspecified foot with unspecified severity: Secondary | ICD-10-CM | POA: Diagnosis not present

## 2011-08-14 DIAGNOSIS — L97209 Non-pressure chronic ulcer of unspecified calf with unspecified severity: Secondary | ICD-10-CM | POA: Diagnosis not present

## 2011-08-14 DIAGNOSIS — L97309 Non-pressure chronic ulcer of unspecified ankle with unspecified severity: Secondary | ICD-10-CM | POA: Diagnosis not present

## 2011-08-14 DIAGNOSIS — L97409 Non-pressure chronic ulcer of unspecified heel and midfoot with unspecified severity: Secondary | ICD-10-CM | POA: Diagnosis not present

## 2011-08-14 DIAGNOSIS — I872 Venous insufficiency (chronic) (peripheral): Secondary | ICD-10-CM | POA: Diagnosis not present

## 2011-08-14 DIAGNOSIS — I739 Peripheral vascular disease, unspecified: Secondary | ICD-10-CM | POA: Diagnosis not present

## 2011-08-15 DIAGNOSIS — N289 Disorder of kidney and ureter, unspecified: Secondary | ICD-10-CM | POA: Diagnosis not present

## 2011-08-15 DIAGNOSIS — N390 Urinary tract infection, site not specified: Secondary | ICD-10-CM | POA: Diagnosis not present

## 2011-08-15 DIAGNOSIS — I83009 Varicose veins of unspecified lower extremity with ulcer of unspecified site: Secondary | ICD-10-CM | POA: Diagnosis not present

## 2011-08-15 DIAGNOSIS — A419 Sepsis, unspecified organism: Secondary | ICD-10-CM | POA: Diagnosis not present

## 2011-08-15 DIAGNOSIS — E860 Dehydration: Secondary | ICD-10-CM | POA: Diagnosis not present

## 2011-08-15 DIAGNOSIS — R269 Unspecified abnormalities of gait and mobility: Secondary | ICD-10-CM | POA: Diagnosis not present

## 2011-08-17 DIAGNOSIS — R269 Unspecified abnormalities of gait and mobility: Secondary | ICD-10-CM | POA: Diagnosis not present

## 2011-08-17 DIAGNOSIS — A419 Sepsis, unspecified organism: Secondary | ICD-10-CM | POA: Diagnosis not present

## 2011-08-17 DIAGNOSIS — I83009 Varicose veins of unspecified lower extremity with ulcer of unspecified site: Secondary | ICD-10-CM | POA: Diagnosis not present

## 2011-08-17 DIAGNOSIS — E860 Dehydration: Secondary | ICD-10-CM | POA: Diagnosis not present

## 2011-08-17 DIAGNOSIS — N289 Disorder of kidney and ureter, unspecified: Secondary | ICD-10-CM | POA: Diagnosis not present

## 2011-08-17 DIAGNOSIS — N390 Urinary tract infection, site not specified: Secondary | ICD-10-CM | POA: Diagnosis not present

## 2011-08-21 DIAGNOSIS — L97309 Non-pressure chronic ulcer of unspecified ankle with unspecified severity: Secondary | ICD-10-CM | POA: Diagnosis not present

## 2011-08-21 DIAGNOSIS — L97509 Non-pressure chronic ulcer of other part of unspecified foot with unspecified severity: Secondary | ICD-10-CM | POA: Diagnosis not present

## 2011-08-21 DIAGNOSIS — L97209 Non-pressure chronic ulcer of unspecified calf with unspecified severity: Secondary | ICD-10-CM | POA: Diagnosis not present

## 2011-08-21 DIAGNOSIS — L97409 Non-pressure chronic ulcer of unspecified heel and midfoot with unspecified severity: Secondary | ICD-10-CM | POA: Diagnosis not present

## 2011-08-21 DIAGNOSIS — I872 Venous insufficiency (chronic) (peripheral): Secondary | ICD-10-CM | POA: Diagnosis not present

## 2011-08-21 DIAGNOSIS — I739 Peripheral vascular disease, unspecified: Secondary | ICD-10-CM | POA: Diagnosis not present

## 2011-08-22 DIAGNOSIS — I83009 Varicose veins of unspecified lower extremity with ulcer of unspecified site: Secondary | ICD-10-CM | POA: Diagnosis not present

## 2011-08-22 DIAGNOSIS — A419 Sepsis, unspecified organism: Secondary | ICD-10-CM | POA: Diagnosis not present

## 2011-08-22 DIAGNOSIS — M79609 Pain in unspecified limb: Secondary | ICD-10-CM | POA: Diagnosis not present

## 2011-08-22 DIAGNOSIS — M898X9 Other specified disorders of bone, unspecified site: Secondary | ICD-10-CM | POA: Diagnosis not present

## 2011-08-22 DIAGNOSIS — E860 Dehydration: Secondary | ICD-10-CM | POA: Diagnosis not present

## 2011-08-22 DIAGNOSIS — N390 Urinary tract infection, site not specified: Secondary | ICD-10-CM | POA: Diagnosis not present

## 2011-08-22 DIAGNOSIS — N289 Disorder of kidney and ureter, unspecified: Secondary | ICD-10-CM | POA: Diagnosis not present

## 2011-08-22 DIAGNOSIS — R269 Unspecified abnormalities of gait and mobility: Secondary | ICD-10-CM | POA: Diagnosis not present

## 2011-08-24 DIAGNOSIS — A419 Sepsis, unspecified organism: Secondary | ICD-10-CM | POA: Diagnosis not present

## 2011-08-24 DIAGNOSIS — E860 Dehydration: Secondary | ICD-10-CM | POA: Diagnosis not present

## 2011-08-24 DIAGNOSIS — N390 Urinary tract infection, site not specified: Secondary | ICD-10-CM | POA: Diagnosis not present

## 2011-08-24 DIAGNOSIS — N289 Disorder of kidney and ureter, unspecified: Secondary | ICD-10-CM | POA: Diagnosis not present

## 2011-08-24 DIAGNOSIS — I83009 Varicose veins of unspecified lower extremity with ulcer of unspecified site: Secondary | ICD-10-CM | POA: Diagnosis not present

## 2011-08-24 DIAGNOSIS — R269 Unspecified abnormalities of gait and mobility: Secondary | ICD-10-CM | POA: Diagnosis not present

## 2011-08-28 DIAGNOSIS — L02619 Cutaneous abscess of unspecified foot: Secondary | ICD-10-CM | POA: Diagnosis not present

## 2011-08-28 DIAGNOSIS — I10 Essential (primary) hypertension: Secondary | ICD-10-CM | POA: Diagnosis not present

## 2011-08-28 DIAGNOSIS — L03119 Cellulitis of unspecified part of limb: Secondary | ICD-10-CM | POA: Diagnosis not present

## 2011-08-28 DIAGNOSIS — I739 Peripheral vascular disease, unspecified: Secondary | ICD-10-CM | POA: Diagnosis not present

## 2011-08-28 DIAGNOSIS — L97309 Non-pressure chronic ulcer of unspecified ankle with unspecified severity: Secondary | ICD-10-CM | POA: Diagnosis not present

## 2011-08-28 DIAGNOSIS — I872 Venous insufficiency (chronic) (peripheral): Secondary | ICD-10-CM | POA: Diagnosis not present

## 2011-08-28 DIAGNOSIS — L97209 Non-pressure chronic ulcer of unspecified calf with unspecified severity: Secondary | ICD-10-CM | POA: Diagnosis not present

## 2011-08-28 DIAGNOSIS — L97509 Non-pressure chronic ulcer of other part of unspecified foot with unspecified severity: Secondary | ICD-10-CM | POA: Diagnosis not present

## 2011-09-04 DIAGNOSIS — G5790 Unspecified mononeuropathy of unspecified lower limb: Secondary | ICD-10-CM | POA: Diagnosis present

## 2011-09-04 DIAGNOSIS — B965 Pseudomonas (aeruginosa) (mallei) (pseudomallei) as the cause of diseases classified elsewhere: Secondary | ICD-10-CM | POA: Diagnosis present

## 2011-09-04 DIAGNOSIS — I10 Essential (primary) hypertension: Secondary | ICD-10-CM | POA: Diagnosis present

## 2011-09-04 DIAGNOSIS — B961 Klebsiella pneumoniae [K. pneumoniae] as the cause of diseases classified elsewhere: Secondary | ICD-10-CM | POA: Diagnosis present

## 2011-09-04 DIAGNOSIS — Z886 Allergy status to analgesic agent status: Secondary | ICD-10-CM | POA: Diagnosis not present

## 2011-09-04 DIAGNOSIS — L02419 Cutaneous abscess of limb, unspecified: Secondary | ICD-10-CM | POA: Diagnosis not present

## 2011-09-04 DIAGNOSIS — L03119 Cellulitis of unspecified part of limb: Secondary | ICD-10-CM | POA: Diagnosis present

## 2011-09-04 DIAGNOSIS — L02619 Cutaneous abscess of unspecified foot: Secondary | ICD-10-CM | POA: Diagnosis not present

## 2011-09-04 DIAGNOSIS — Z780 Asymptomatic menopausal state: Secondary | ICD-10-CM | POA: Diagnosis not present

## 2011-09-04 DIAGNOSIS — Z880 Allergy status to penicillin: Secondary | ICD-10-CM | POA: Diagnosis not present

## 2011-09-04 DIAGNOSIS — A498 Other bacterial infections of unspecified site: Secondary | ICD-10-CM | POA: Diagnosis present

## 2011-09-04 DIAGNOSIS — L97509 Non-pressure chronic ulcer of other part of unspecified foot with unspecified severity: Secondary | ICD-10-CM | POA: Diagnosis not present

## 2011-09-04 DIAGNOSIS — L97809 Non-pressure chronic ulcer of other part of unspecified lower leg with unspecified severity: Secondary | ICD-10-CM | POA: Diagnosis not present

## 2011-09-04 DIAGNOSIS — I872 Venous insufficiency (chronic) (peripheral): Secondary | ICD-10-CM | POA: Diagnosis not present

## 2011-09-09 DIAGNOSIS — A419 Sepsis, unspecified organism: Secondary | ICD-10-CM | POA: Diagnosis not present

## 2011-09-09 DIAGNOSIS — L97909 Non-pressure chronic ulcer of unspecified part of unspecified lower leg with unspecified severity: Secondary | ICD-10-CM | POA: Diagnosis not present

## 2011-09-09 DIAGNOSIS — I83009 Varicose veins of unspecified lower extremity with ulcer of unspecified site: Secondary | ICD-10-CM | POA: Diagnosis not present

## 2011-09-09 DIAGNOSIS — N289 Disorder of kidney and ureter, unspecified: Secondary | ICD-10-CM | POA: Diagnosis not present

## 2011-09-09 DIAGNOSIS — R5381 Other malaise: Secondary | ICD-10-CM | POA: Diagnosis not present

## 2011-09-10 DIAGNOSIS — I83009 Varicose veins of unspecified lower extremity with ulcer of unspecified site: Secondary | ICD-10-CM | POA: Diagnosis not present

## 2011-09-10 DIAGNOSIS — R5381 Other malaise: Secondary | ICD-10-CM | POA: Diagnosis not present

## 2011-09-10 DIAGNOSIS — N289 Disorder of kidney and ureter, unspecified: Secondary | ICD-10-CM | POA: Diagnosis not present

## 2011-09-10 DIAGNOSIS — A419 Sepsis, unspecified organism: Secondary | ICD-10-CM | POA: Diagnosis not present

## 2011-09-11 DIAGNOSIS — A419 Sepsis, unspecified organism: Secondary | ICD-10-CM | POA: Diagnosis not present

## 2011-09-11 DIAGNOSIS — L97909 Non-pressure chronic ulcer of unspecified part of unspecified lower leg with unspecified severity: Secondary | ICD-10-CM | POA: Diagnosis not present

## 2011-09-11 DIAGNOSIS — R5381 Other malaise: Secondary | ICD-10-CM | POA: Diagnosis not present

## 2011-09-11 DIAGNOSIS — N289 Disorder of kidney and ureter, unspecified: Secondary | ICD-10-CM | POA: Diagnosis not present

## 2011-09-12 DIAGNOSIS — A419 Sepsis, unspecified organism: Secondary | ICD-10-CM | POA: Diagnosis not present

## 2011-09-12 DIAGNOSIS — R5381 Other malaise: Secondary | ICD-10-CM | POA: Diagnosis not present

## 2011-09-12 DIAGNOSIS — I83009 Varicose veins of unspecified lower extremity with ulcer of unspecified site: Secondary | ICD-10-CM | POA: Diagnosis not present

## 2011-09-12 DIAGNOSIS — N289 Disorder of kidney and ureter, unspecified: Secondary | ICD-10-CM | POA: Diagnosis not present

## 2011-09-13 DIAGNOSIS — L97909 Non-pressure chronic ulcer of unspecified part of unspecified lower leg with unspecified severity: Secondary | ICD-10-CM | POA: Diagnosis not present

## 2011-09-13 DIAGNOSIS — R5381 Other malaise: Secondary | ICD-10-CM | POA: Diagnosis not present

## 2011-09-13 DIAGNOSIS — A419 Sepsis, unspecified organism: Secondary | ICD-10-CM | POA: Diagnosis not present

## 2011-09-13 DIAGNOSIS — N289 Disorder of kidney and ureter, unspecified: Secondary | ICD-10-CM | POA: Diagnosis not present

## 2011-09-14 DIAGNOSIS — L97809 Non-pressure chronic ulcer of other part of unspecified lower leg with unspecified severity: Secondary | ICD-10-CM | POA: Diagnosis not present

## 2011-09-14 DIAGNOSIS — S98119A Complete traumatic amputation of unspecified great toe, initial encounter: Secondary | ICD-10-CM | POA: Diagnosis not present

## 2011-09-14 DIAGNOSIS — M129 Arthropathy, unspecified: Secondary | ICD-10-CM | POA: Diagnosis not present

## 2011-09-14 DIAGNOSIS — Z880 Allergy status to penicillin: Secondary | ICD-10-CM | POA: Diagnosis not present

## 2011-09-14 DIAGNOSIS — I872 Venous insufficiency (chronic) (peripheral): Secondary | ICD-10-CM | POA: Diagnosis not present

## 2011-09-14 DIAGNOSIS — Z96649 Presence of unspecified artificial hip joint: Secondary | ICD-10-CM | POA: Diagnosis not present

## 2011-09-14 DIAGNOSIS — Z882 Allergy status to sulfonamides status: Secondary | ICD-10-CM | POA: Diagnosis not present

## 2011-09-14 DIAGNOSIS — L97509 Non-pressure chronic ulcer of other part of unspecified foot with unspecified severity: Secondary | ICD-10-CM | POA: Diagnosis not present

## 2011-09-14 DIAGNOSIS — I83219 Varicose veins of right lower extremity with both ulcer of unspecified site and inflammation: Secondary | ICD-10-CM | POA: Diagnosis not present

## 2011-09-14 DIAGNOSIS — Z886 Allergy status to analgesic agent status: Secondary | ICD-10-CM | POA: Diagnosis not present

## 2011-09-14 DIAGNOSIS — L97309 Non-pressure chronic ulcer of unspecified ankle with unspecified severity: Secondary | ICD-10-CM | POA: Diagnosis not present

## 2011-09-14 DIAGNOSIS — L97919 Non-pressure chronic ulcer of unspecified part of right lower leg with unspecified severity: Secondary | ICD-10-CM | POA: Diagnosis not present

## 2011-09-15 DIAGNOSIS — S98119A Complete traumatic amputation of unspecified great toe, initial encounter: Secondary | ICD-10-CM | POA: Diagnosis not present

## 2011-09-15 DIAGNOSIS — L97309 Non-pressure chronic ulcer of unspecified ankle with unspecified severity: Secondary | ICD-10-CM | POA: Diagnosis not present

## 2011-09-15 DIAGNOSIS — A419 Sepsis, unspecified organism: Secondary | ICD-10-CM | POA: Diagnosis not present

## 2011-09-15 DIAGNOSIS — L97509 Non-pressure chronic ulcer of other part of unspecified foot with unspecified severity: Secondary | ICD-10-CM | POA: Diagnosis not present

## 2011-09-15 DIAGNOSIS — I872 Venous insufficiency (chronic) (peripheral): Secondary | ICD-10-CM | POA: Diagnosis not present

## 2011-09-15 DIAGNOSIS — M129 Arthropathy, unspecified: Secondary | ICD-10-CM | POA: Diagnosis not present

## 2011-09-15 DIAGNOSIS — R5383 Other fatigue: Secondary | ICD-10-CM | POA: Diagnosis not present

## 2011-09-15 DIAGNOSIS — I83009 Varicose veins of unspecified lower extremity with ulcer of unspecified site: Secondary | ICD-10-CM | POA: Diagnosis not present

## 2011-09-15 DIAGNOSIS — L97809 Non-pressure chronic ulcer of other part of unspecified lower leg with unspecified severity: Secondary | ICD-10-CM | POA: Diagnosis not present

## 2011-09-15 DIAGNOSIS — N289 Disorder of kidney and ureter, unspecified: Secondary | ICD-10-CM | POA: Diagnosis not present

## 2011-09-16 DIAGNOSIS — N289 Disorder of kidney and ureter, unspecified: Secondary | ICD-10-CM | POA: Diagnosis not present

## 2011-09-16 DIAGNOSIS — R5383 Other fatigue: Secondary | ICD-10-CM | POA: Diagnosis not present

## 2011-09-16 DIAGNOSIS — I83009 Varicose veins of unspecified lower extremity with ulcer of unspecified site: Secondary | ICD-10-CM | POA: Diagnosis not present

## 2011-09-16 DIAGNOSIS — A419 Sepsis, unspecified organism: Secondary | ICD-10-CM | POA: Diagnosis not present

## 2011-09-19 DIAGNOSIS — R5381 Other malaise: Secondary | ICD-10-CM | POA: Diagnosis not present

## 2011-09-19 DIAGNOSIS — L97909 Non-pressure chronic ulcer of unspecified part of unspecified lower leg with unspecified severity: Secondary | ICD-10-CM | POA: Diagnosis not present

## 2011-09-19 DIAGNOSIS — N289 Disorder of kidney and ureter, unspecified: Secondary | ICD-10-CM | POA: Diagnosis not present

## 2011-09-19 DIAGNOSIS — A419 Sepsis, unspecified organism: Secondary | ICD-10-CM | POA: Diagnosis not present

## 2011-09-21 DIAGNOSIS — L97509 Non-pressure chronic ulcer of other part of unspecified foot with unspecified severity: Secondary | ICD-10-CM | POA: Diagnosis not present

## 2011-09-21 DIAGNOSIS — A419 Sepsis, unspecified organism: Secondary | ICD-10-CM | POA: Diagnosis not present

## 2011-09-21 DIAGNOSIS — I872 Venous insufficiency (chronic) (peripheral): Secondary | ICD-10-CM | POA: Diagnosis not present

## 2011-09-21 DIAGNOSIS — M129 Arthropathy, unspecified: Secondary | ICD-10-CM | POA: Diagnosis not present

## 2011-09-21 DIAGNOSIS — S98119A Complete traumatic amputation of unspecified great toe, initial encounter: Secondary | ICD-10-CM | POA: Diagnosis not present

## 2011-09-21 DIAGNOSIS — R5381 Other malaise: Secondary | ICD-10-CM | POA: Diagnosis not present

## 2011-09-21 DIAGNOSIS — L97309 Non-pressure chronic ulcer of unspecified ankle with unspecified severity: Secondary | ICD-10-CM | POA: Diagnosis not present

## 2011-09-21 DIAGNOSIS — L97909 Non-pressure chronic ulcer of unspecified part of unspecified lower leg with unspecified severity: Secondary | ICD-10-CM | POA: Diagnosis not present

## 2011-09-21 DIAGNOSIS — L97809 Non-pressure chronic ulcer of other part of unspecified lower leg with unspecified severity: Secondary | ICD-10-CM | POA: Diagnosis not present

## 2011-09-21 DIAGNOSIS — N289 Disorder of kidney and ureter, unspecified: Secondary | ICD-10-CM | POA: Diagnosis not present

## 2011-09-21 DIAGNOSIS — L97919 Non-pressure chronic ulcer of unspecified part of right lower leg with unspecified severity: Secondary | ICD-10-CM | POA: Diagnosis not present

## 2011-09-22 ENCOUNTER — Encounter: Payer: Self-pay | Admitting: Vascular Surgery

## 2011-09-22 ENCOUNTER — Other Ambulatory Visit: Payer: Self-pay

## 2011-09-22 DIAGNOSIS — I739 Peripheral vascular disease, unspecified: Secondary | ICD-10-CM

## 2011-09-25 DIAGNOSIS — N289 Disorder of kidney and ureter, unspecified: Secondary | ICD-10-CM | POA: Diagnosis not present

## 2011-09-25 DIAGNOSIS — R5381 Other malaise: Secondary | ICD-10-CM | POA: Diagnosis not present

## 2011-09-25 DIAGNOSIS — L97909 Non-pressure chronic ulcer of unspecified part of unspecified lower leg with unspecified severity: Secondary | ICD-10-CM | POA: Diagnosis not present

## 2011-09-25 DIAGNOSIS — A419 Sepsis, unspecified organism: Secondary | ICD-10-CM | POA: Diagnosis not present

## 2011-09-26 ENCOUNTER — Encounter (INDEPENDENT_AMBULATORY_CARE_PROVIDER_SITE_OTHER): Payer: Medicare Other | Admitting: *Deleted

## 2011-09-26 ENCOUNTER — Encounter: Payer: Self-pay | Admitting: Vascular Surgery

## 2011-09-26 ENCOUNTER — Ambulatory Visit (INDEPENDENT_AMBULATORY_CARE_PROVIDER_SITE_OTHER): Payer: Medicare Other | Admitting: Vascular Surgery

## 2011-09-26 VITALS — BP 126/83 | HR 96 | Resp 18 | Ht 64.0 in | Wt 212.6 lb

## 2011-09-26 DIAGNOSIS — N289 Disorder of kidney and ureter, unspecified: Secondary | ICD-10-CM | POA: Diagnosis not present

## 2011-09-26 DIAGNOSIS — L97909 Non-pressure chronic ulcer of unspecified part of unspecified lower leg with unspecified severity: Secondary | ICD-10-CM | POA: Diagnosis not present

## 2011-09-26 DIAGNOSIS — L97509 Non-pressure chronic ulcer of other part of unspecified foot with unspecified severity: Secondary | ICD-10-CM

## 2011-09-26 DIAGNOSIS — A419 Sepsis, unspecified organism: Secondary | ICD-10-CM | POA: Diagnosis not present

## 2011-09-26 DIAGNOSIS — I7025 Atherosclerosis of native arteries of other extremities with ulceration: Secondary | ICD-10-CM | POA: Insufficient documentation

## 2011-09-26 DIAGNOSIS — R5383 Other fatigue: Secondary | ICD-10-CM | POA: Diagnosis not present

## 2011-09-26 DIAGNOSIS — I739 Peripheral vascular disease, unspecified: Secondary | ICD-10-CM

## 2011-09-26 DIAGNOSIS — L98499 Non-pressure chronic ulcer of skin of other sites with unspecified severity: Secondary | ICD-10-CM

## 2011-09-26 NOTE — Progress Notes (Signed)
The patient presents today for evaluation of lower extremity wounds and question of lower extremity arterial insufficiency. She has had a many year history of bilateral venous stasis ulceration that has been treated with several methods to include boot compression and other topical lotions. Her family with her ports this would nearly healed and she would develop progressive ulcerations again. She does have moderate discomfort related to this. Recently she has had marked progression of both lower extremity ulcerations. This was quite extensive on the medial aspect of her right calf and onto the dorsum of her left foot. She did have hospitalization at [facility] for IV antibiotics and is now being seen at the [facility] wound center. She has had extensive debridement of these ulcers. We're seeing her today to evaluate her arterial system to assure adequate flow for healing. I have her records from [facility] outpatient noninvasive lab and also we have imaged her left leg arteries today.  Past Medical History  Diagnosis Date  . Arthritis   . Peripheral vascular disease   . Ulcer     BLE ulcerations    History  Substance Use Topics  . Smoking status: Former Smoker    Quit date: 01/24/2000  . Smokeless tobacco: Not on file  . Alcohol Use: No    Family History  Problem Relation Age of Onset  . Cancer Other   . Hypertension Other   . Stroke Other   . Diabetes Other     Allergies  Allergen Reactions  . Asa (Aspirin)   . Penicillins   . Sulfa Antibiotics     Current outpatient prescriptions:benazepril (LOTENSIN) 20 MG tablet, Take 20 mg by mouth daily., Disp: , Rfl: ;  ciprofloxacin (CIPRO) 500 MG tablet, Take 500 mg by mouth 2 (two) times daily., Disp: , Rfl: ;  fluorometholone (FML) 0.1 % ophthalmic suspension, Apply 1 drop to eye every 4 (four) hours., Disp: , Rfl: ;  gabapentin (NEURONTIN) 600 MG tablet, Take 600 mg by mouth 3 (three) times daily., Disp: , Rfl:  INVANZ 1 G  injection, , Disp: , Rfl: ;  meclizine (ANTIVERT) 12.5 MG tablet, Take 12.5 mg by mouth 3 (three) times daily as needed., Disp: , Rfl:   BP 126/83  Pulse 96  Resp 18  Ht 5\' 4"  (1.626 m)  Wt 212 lb 9.6 oz (96.435 kg)  BMI 36.49 kg/m2  Body mass index is 36.49 kg/(m^2).       Review of systems negative aside from her history of present illness.  Physical exam: Well-developed female in no acute distress sitting on the chair Neurologically she is grossly intact Orthopedic standpoint she does have hip pinning making her quite stiff and difficult to move her legs. Skin with a major ultra ulcerations in her right and left leg. This is extensive over the entire medial ankle on the right and on the entire dorsum of her left foot. There is a good granulating base on both of these with no tendons exposed currently. Pulse status: She does have 2+ dorsalis pedis pulse on the right and a 2+ anterior tibial pulse at her ankle crease on the left.  Noninvasive studies were reviewed [facility] and also in our office today reveal triphasic flow throughout her tibial vessels down to the proximal portion of these.  Impression and plan: No evidence of lower extremity arterial insufficiency bilaterally. I discussed this at length with the patient and her family present. I do feel that she has adequate flow for healing  her venous ulcers. I explained that this would be a lifelong aggravation with a recurrent ulceration. Ex-preemie she is having appropriate treatment with topical ointment, debridement, and most importantly compression. She was reassured this discussion will see [place] again on an as-needed basis

## 2011-09-28 DIAGNOSIS — L03119 Cellulitis of unspecified part of limb: Secondary | ICD-10-CM | POA: Diagnosis not present

## 2011-09-28 DIAGNOSIS — R5381 Other malaise: Secondary | ICD-10-CM | POA: Diagnosis not present

## 2011-09-28 DIAGNOSIS — L02619 Cutaneous abscess of unspecified foot: Secondary | ICD-10-CM | POA: Diagnosis not present

## 2011-09-28 DIAGNOSIS — I872 Venous insufficiency (chronic) (peripheral): Secondary | ICD-10-CM | POA: Diagnosis not present

## 2011-09-28 DIAGNOSIS — L97809 Non-pressure chronic ulcer of other part of unspecified lower leg with unspecified severity: Secondary | ICD-10-CM | POA: Diagnosis not present

## 2011-09-28 DIAGNOSIS — I83219 Varicose veins of right lower extremity with both ulcer of unspecified site and inflammation: Secondary | ICD-10-CM | POA: Diagnosis not present

## 2011-09-28 DIAGNOSIS — L97509 Non-pressure chronic ulcer of other part of unspecified foot with unspecified severity: Secondary | ICD-10-CM | POA: Diagnosis not present

## 2011-09-28 DIAGNOSIS — I87319 Chronic venous hypertension (idiopathic) with ulcer of unspecified lower extremity: Secondary | ICD-10-CM | POA: Diagnosis not present

## 2011-09-28 DIAGNOSIS — A419 Sepsis, unspecified organism: Secondary | ICD-10-CM | POA: Diagnosis not present

## 2011-09-28 DIAGNOSIS — N289 Disorder of kidney and ureter, unspecified: Secondary | ICD-10-CM | POA: Diagnosis not present

## 2011-09-28 DIAGNOSIS — L97309 Non-pressure chronic ulcer of unspecified ankle with unspecified severity: Secondary | ICD-10-CM | POA: Diagnosis not present

## 2011-09-28 DIAGNOSIS — L97909 Non-pressure chronic ulcer of unspecified part of unspecified lower leg with unspecified severity: Secondary | ICD-10-CM | POA: Diagnosis not present

## 2011-09-28 DIAGNOSIS — L97929 Non-pressure chronic ulcer of unspecified part of left lower leg with unspecified severity: Secondary | ICD-10-CM | POA: Diagnosis not present

## 2011-10-02 DIAGNOSIS — I83009 Varicose veins of unspecified lower extremity with ulcer of unspecified site: Secondary | ICD-10-CM | POA: Diagnosis not present

## 2011-10-02 DIAGNOSIS — R5383 Other fatigue: Secondary | ICD-10-CM | POA: Diagnosis not present

## 2011-10-02 DIAGNOSIS — A419 Sepsis, unspecified organism: Secondary | ICD-10-CM | POA: Diagnosis not present

## 2011-10-02 DIAGNOSIS — N289 Disorder of kidney and ureter, unspecified: Secondary | ICD-10-CM | POA: Diagnosis not present

## 2011-10-03 DIAGNOSIS — A419 Sepsis, unspecified organism: Secondary | ICD-10-CM | POA: Diagnosis not present

## 2011-10-03 DIAGNOSIS — I83009 Varicose veins of unspecified lower extremity with ulcer of unspecified site: Secondary | ICD-10-CM | POA: Diagnosis not present

## 2011-10-03 DIAGNOSIS — R5383 Other fatigue: Secondary | ICD-10-CM | POA: Diagnosis not present

## 2011-10-03 DIAGNOSIS — N289 Disorder of kidney and ureter, unspecified: Secondary | ICD-10-CM | POA: Diagnosis not present

## 2011-10-04 DIAGNOSIS — R5383 Other fatigue: Secondary | ICD-10-CM | POA: Diagnosis not present

## 2011-10-04 DIAGNOSIS — I83009 Varicose veins of unspecified lower extremity with ulcer of unspecified site: Secondary | ICD-10-CM | POA: Diagnosis not present

## 2011-10-04 DIAGNOSIS — N289 Disorder of kidney and ureter, unspecified: Secondary | ICD-10-CM | POA: Diagnosis not present

## 2011-10-04 DIAGNOSIS — L97909 Non-pressure chronic ulcer of unspecified part of unspecified lower leg with unspecified severity: Secondary | ICD-10-CM | POA: Diagnosis not present

## 2011-10-04 DIAGNOSIS — A419 Sepsis, unspecified organism: Secondary | ICD-10-CM | POA: Diagnosis not present

## 2011-10-06 DIAGNOSIS — N289 Disorder of kidney and ureter, unspecified: Secondary | ICD-10-CM | POA: Diagnosis not present

## 2011-10-06 DIAGNOSIS — A419 Sepsis, unspecified organism: Secondary | ICD-10-CM | POA: Diagnosis not present

## 2011-10-06 DIAGNOSIS — R5383 Other fatigue: Secondary | ICD-10-CM | POA: Diagnosis not present

## 2011-10-06 DIAGNOSIS — I83009 Varicose veins of unspecified lower extremity with ulcer of unspecified site: Secondary | ICD-10-CM | POA: Diagnosis not present

## 2011-10-10 DIAGNOSIS — I83009 Varicose veins of unspecified lower extremity with ulcer of unspecified site: Secondary | ICD-10-CM | POA: Diagnosis not present

## 2011-10-10 DIAGNOSIS — A419 Sepsis, unspecified organism: Secondary | ICD-10-CM | POA: Diagnosis not present

## 2011-10-10 DIAGNOSIS — N289 Disorder of kidney and ureter, unspecified: Secondary | ICD-10-CM | POA: Diagnosis not present

## 2011-10-10 DIAGNOSIS — R5383 Other fatigue: Secondary | ICD-10-CM | POA: Diagnosis not present

## 2011-10-12 DIAGNOSIS — I872 Venous insufficiency (chronic) (peripheral): Secondary | ICD-10-CM | POA: Diagnosis not present

## 2011-10-12 DIAGNOSIS — L97509 Non-pressure chronic ulcer of other part of unspecified foot with unspecified severity: Secondary | ICD-10-CM | POA: Diagnosis not present

## 2011-10-12 DIAGNOSIS — A419 Sepsis, unspecified organism: Secondary | ICD-10-CM | POA: Diagnosis not present

## 2011-10-12 DIAGNOSIS — I83009 Varicose veins of unspecified lower extremity with ulcer of unspecified site: Secondary | ICD-10-CM | POA: Diagnosis not present

## 2011-10-12 DIAGNOSIS — L97309 Non-pressure chronic ulcer of unspecified ankle with unspecified severity: Secondary | ICD-10-CM | POA: Diagnosis not present

## 2011-10-12 DIAGNOSIS — I83219 Varicose veins of right lower extremity with both ulcer of unspecified site and inflammation: Secondary | ICD-10-CM | POA: Diagnosis not present

## 2011-10-12 DIAGNOSIS — R5383 Other fatigue: Secondary | ICD-10-CM | POA: Diagnosis not present

## 2011-10-12 DIAGNOSIS — N289 Disorder of kidney and ureter, unspecified: Secondary | ICD-10-CM | POA: Diagnosis not present

## 2011-10-12 DIAGNOSIS — I87319 Chronic venous hypertension (idiopathic) with ulcer of unspecified lower extremity: Secondary | ICD-10-CM | POA: Diagnosis not present

## 2011-10-12 DIAGNOSIS — L97809 Non-pressure chronic ulcer of other part of unspecified lower leg with unspecified severity: Secondary | ICD-10-CM | POA: Diagnosis not present

## 2011-10-16 DIAGNOSIS — I83009 Varicose veins of unspecified lower extremity with ulcer of unspecified site: Secondary | ICD-10-CM | POA: Diagnosis not present

## 2011-10-16 DIAGNOSIS — A419 Sepsis, unspecified organism: Secondary | ICD-10-CM | POA: Diagnosis not present

## 2011-10-16 DIAGNOSIS — N289 Disorder of kidney and ureter, unspecified: Secondary | ICD-10-CM | POA: Diagnosis not present

## 2011-10-16 DIAGNOSIS — R5383 Other fatigue: Secondary | ICD-10-CM | POA: Diagnosis not present

## 2011-10-19 DIAGNOSIS — A419 Sepsis, unspecified organism: Secondary | ICD-10-CM | POA: Diagnosis not present

## 2011-10-19 DIAGNOSIS — R5383 Other fatigue: Secondary | ICD-10-CM | POA: Diagnosis not present

## 2011-10-19 DIAGNOSIS — N289 Disorder of kidney and ureter, unspecified: Secondary | ICD-10-CM | POA: Diagnosis not present

## 2011-10-19 DIAGNOSIS — L97909 Non-pressure chronic ulcer of unspecified part of unspecified lower leg with unspecified severity: Secondary | ICD-10-CM | POA: Diagnosis not present

## 2011-10-23 DIAGNOSIS — R5383 Other fatigue: Secondary | ICD-10-CM | POA: Diagnosis not present

## 2011-10-23 DIAGNOSIS — A419 Sepsis, unspecified organism: Secondary | ICD-10-CM | POA: Diagnosis not present

## 2011-10-23 DIAGNOSIS — I83009 Varicose veins of unspecified lower extremity with ulcer of unspecified site: Secondary | ICD-10-CM | POA: Diagnosis not present

## 2011-10-23 DIAGNOSIS — N289 Disorder of kidney and ureter, unspecified: Secondary | ICD-10-CM | POA: Diagnosis not present

## 2011-10-26 DIAGNOSIS — L97809 Non-pressure chronic ulcer of other part of unspecified lower leg with unspecified severity: Secondary | ICD-10-CM | POA: Diagnosis not present

## 2011-10-26 DIAGNOSIS — I83229 Varicose veins of left lower extremity with both ulcer of unspecified site and inflammation: Secondary | ICD-10-CM | POA: Diagnosis not present

## 2011-10-26 DIAGNOSIS — L97509 Non-pressure chronic ulcer of other part of unspecified foot with unspecified severity: Secondary | ICD-10-CM | POA: Diagnosis not present

## 2011-10-26 DIAGNOSIS — L97309 Non-pressure chronic ulcer of unspecified ankle with unspecified severity: Secondary | ICD-10-CM | POA: Diagnosis not present

## 2011-10-26 DIAGNOSIS — I83219 Varicose veins of right lower extremity with both ulcer of unspecified site and inflammation: Secondary | ICD-10-CM | POA: Diagnosis not present

## 2011-10-31 DIAGNOSIS — L97909 Non-pressure chronic ulcer of unspecified part of unspecified lower leg with unspecified severity: Secondary | ICD-10-CM | POA: Diagnosis not present

## 2011-10-31 DIAGNOSIS — R5383 Other fatigue: Secondary | ICD-10-CM | POA: Diagnosis not present

## 2011-10-31 DIAGNOSIS — A419 Sepsis, unspecified organism: Secondary | ICD-10-CM | POA: Diagnosis not present

## 2011-10-31 DIAGNOSIS — N289 Disorder of kidney and ureter, unspecified: Secondary | ICD-10-CM | POA: Diagnosis not present

## 2011-11-01 DIAGNOSIS — L97809 Non-pressure chronic ulcer of other part of unspecified lower leg with unspecified severity: Secondary | ICD-10-CM | POA: Diagnosis not present

## 2011-11-01 DIAGNOSIS — L97309 Non-pressure chronic ulcer of unspecified ankle with unspecified severity: Secondary | ICD-10-CM | POA: Diagnosis not present

## 2011-11-01 DIAGNOSIS — I83219 Varicose veins of right lower extremity with both ulcer of unspecified site and inflammation: Secondary | ICD-10-CM | POA: Diagnosis not present

## 2011-11-01 DIAGNOSIS — L97509 Non-pressure chronic ulcer of other part of unspecified foot with unspecified severity: Secondary | ICD-10-CM | POA: Diagnosis not present

## 2011-11-03 DIAGNOSIS — L97909 Non-pressure chronic ulcer of unspecified part of unspecified lower leg with unspecified severity: Secondary | ICD-10-CM | POA: Diagnosis not present

## 2011-11-03 DIAGNOSIS — N289 Disorder of kidney and ureter, unspecified: Secondary | ICD-10-CM | POA: Diagnosis not present

## 2011-11-03 DIAGNOSIS — A419 Sepsis, unspecified organism: Secondary | ICD-10-CM | POA: Diagnosis not present

## 2011-11-03 DIAGNOSIS — R5383 Other fatigue: Secondary | ICD-10-CM | POA: Diagnosis not present

## 2011-11-07 DIAGNOSIS — N289 Disorder of kidney and ureter, unspecified: Secondary | ICD-10-CM | POA: Diagnosis not present

## 2011-11-07 DIAGNOSIS — I83009 Varicose veins of unspecified lower extremity with ulcer of unspecified site: Secondary | ICD-10-CM | POA: Diagnosis not present

## 2011-11-07 DIAGNOSIS — A419 Sepsis, unspecified organism: Secondary | ICD-10-CM | POA: Diagnosis not present

## 2011-11-07 DIAGNOSIS — R5383 Other fatigue: Secondary | ICD-10-CM | POA: Diagnosis not present

## 2011-11-08 DIAGNOSIS — L97909 Non-pressure chronic ulcer of unspecified part of unspecified lower leg with unspecified severity: Secondary | ICD-10-CM | POA: Diagnosis not present

## 2011-11-08 DIAGNOSIS — N289 Disorder of kidney and ureter, unspecified: Secondary | ICD-10-CM | POA: Diagnosis not present

## 2011-11-08 DIAGNOSIS — A419 Sepsis, unspecified organism: Secondary | ICD-10-CM | POA: Diagnosis not present

## 2011-11-08 DIAGNOSIS — R5381 Other malaise: Secondary | ICD-10-CM | POA: Diagnosis not present

## 2011-11-10 DIAGNOSIS — N289 Disorder of kidney and ureter, unspecified: Secondary | ICD-10-CM | POA: Diagnosis not present

## 2011-11-10 DIAGNOSIS — R5381 Other malaise: Secondary | ICD-10-CM | POA: Diagnosis not present

## 2011-11-10 DIAGNOSIS — A419 Sepsis, unspecified organism: Secondary | ICD-10-CM | POA: Diagnosis not present

## 2011-11-10 DIAGNOSIS — I83009 Varicose veins of unspecified lower extremity with ulcer of unspecified site: Secondary | ICD-10-CM | POA: Diagnosis not present

## 2011-11-13 DIAGNOSIS — R5383 Other fatigue: Secondary | ICD-10-CM | POA: Diagnosis not present

## 2011-11-13 DIAGNOSIS — I83009 Varicose veins of unspecified lower extremity with ulcer of unspecified site: Secondary | ICD-10-CM | POA: Diagnosis not present

## 2011-11-13 DIAGNOSIS — N289 Disorder of kidney and ureter, unspecified: Secondary | ICD-10-CM | POA: Diagnosis not present

## 2011-11-13 DIAGNOSIS — A419 Sepsis, unspecified organism: Secondary | ICD-10-CM | POA: Diagnosis not present

## 2011-11-16 DIAGNOSIS — L97509 Non-pressure chronic ulcer of other part of unspecified foot with unspecified severity: Secondary | ICD-10-CM | POA: Diagnosis not present

## 2011-11-16 DIAGNOSIS — L97809 Non-pressure chronic ulcer of other part of unspecified lower leg with unspecified severity: Secondary | ICD-10-CM | POA: Diagnosis not present

## 2011-11-16 DIAGNOSIS — L97919 Non-pressure chronic ulcer of unspecified part of right lower leg with unspecified severity: Secondary | ICD-10-CM | POA: Diagnosis not present

## 2011-11-16 DIAGNOSIS — I83219 Varicose veins of right lower extremity with both ulcer of unspecified site and inflammation: Secondary | ICD-10-CM | POA: Diagnosis not present

## 2011-11-16 DIAGNOSIS — L97309 Non-pressure chronic ulcer of unspecified ankle with unspecified severity: Secondary | ICD-10-CM | POA: Diagnosis not present

## 2011-11-20 DIAGNOSIS — L97909 Non-pressure chronic ulcer of unspecified part of unspecified lower leg with unspecified severity: Secondary | ICD-10-CM | POA: Diagnosis not present

## 2011-11-20 DIAGNOSIS — N289 Disorder of kidney and ureter, unspecified: Secondary | ICD-10-CM | POA: Diagnosis not present

## 2011-11-20 DIAGNOSIS — R5383 Other fatigue: Secondary | ICD-10-CM | POA: Diagnosis not present

## 2011-11-20 DIAGNOSIS — A419 Sepsis, unspecified organism: Secondary | ICD-10-CM | POA: Diagnosis not present

## 2011-11-23 DIAGNOSIS — N289 Disorder of kidney and ureter, unspecified: Secondary | ICD-10-CM | POA: Diagnosis not present

## 2011-11-23 DIAGNOSIS — R5381 Other malaise: Secondary | ICD-10-CM | POA: Diagnosis not present

## 2011-11-23 DIAGNOSIS — I83009 Varicose veins of unspecified lower extremity with ulcer of unspecified site: Secondary | ICD-10-CM | POA: Diagnosis not present

## 2011-11-23 DIAGNOSIS — Z23 Encounter for immunization: Secondary | ICD-10-CM | POA: Diagnosis not present

## 2011-11-23 DIAGNOSIS — A419 Sepsis, unspecified organism: Secondary | ICD-10-CM | POA: Diagnosis not present

## 2011-11-27 DIAGNOSIS — I83009 Varicose veins of unspecified lower extremity with ulcer of unspecified site: Secondary | ICD-10-CM | POA: Diagnosis not present

## 2011-11-27 DIAGNOSIS — N289 Disorder of kidney and ureter, unspecified: Secondary | ICD-10-CM | POA: Diagnosis not present

## 2011-11-27 DIAGNOSIS — A419 Sepsis, unspecified organism: Secondary | ICD-10-CM | POA: Diagnosis not present

## 2011-11-27 DIAGNOSIS — R5381 Other malaise: Secondary | ICD-10-CM | POA: Diagnosis not present

## 2011-11-30 DIAGNOSIS — L97809 Non-pressure chronic ulcer of other part of unspecified lower leg with unspecified severity: Secondary | ICD-10-CM | POA: Diagnosis not present

## 2011-11-30 DIAGNOSIS — L97309 Non-pressure chronic ulcer of unspecified ankle with unspecified severity: Secondary | ICD-10-CM | POA: Diagnosis not present

## 2011-11-30 DIAGNOSIS — L97919 Non-pressure chronic ulcer of unspecified part of right lower leg with unspecified severity: Secondary | ICD-10-CM | POA: Diagnosis not present

## 2011-11-30 DIAGNOSIS — I83219 Varicose veins of right lower extremity with both ulcer of unspecified site and inflammation: Secondary | ICD-10-CM | POA: Diagnosis not present

## 2011-11-30 DIAGNOSIS — F411 Generalized anxiety disorder: Secondary | ICD-10-CM | POA: Diagnosis not present

## 2011-12-01 DIAGNOSIS — L97909 Non-pressure chronic ulcer of unspecified part of unspecified lower leg with unspecified severity: Secondary | ICD-10-CM | POA: Diagnosis not present

## 2011-12-01 DIAGNOSIS — R5383 Other fatigue: Secondary | ICD-10-CM | POA: Diagnosis not present

## 2011-12-01 DIAGNOSIS — R5381 Other malaise: Secondary | ICD-10-CM | POA: Diagnosis not present

## 2011-12-01 DIAGNOSIS — A419 Sepsis, unspecified organism: Secondary | ICD-10-CM | POA: Diagnosis not present

## 2011-12-01 DIAGNOSIS — N289 Disorder of kidney and ureter, unspecified: Secondary | ICD-10-CM | POA: Diagnosis not present

## 2011-12-02 DIAGNOSIS — L97909 Non-pressure chronic ulcer of unspecified part of unspecified lower leg with unspecified severity: Secondary | ICD-10-CM | POA: Diagnosis not present

## 2011-12-02 DIAGNOSIS — A419 Sepsis, unspecified organism: Secondary | ICD-10-CM | POA: Diagnosis not present

## 2011-12-02 DIAGNOSIS — I83009 Varicose veins of unspecified lower extremity with ulcer of unspecified site: Secondary | ICD-10-CM | POA: Diagnosis not present

## 2011-12-02 DIAGNOSIS — N289 Disorder of kidney and ureter, unspecified: Secondary | ICD-10-CM | POA: Diagnosis not present

## 2011-12-02 DIAGNOSIS — R5383 Other fatigue: Secondary | ICD-10-CM | POA: Diagnosis not present

## 2011-12-03 DIAGNOSIS — R5383 Other fatigue: Secondary | ICD-10-CM | POA: Diagnosis not present

## 2011-12-03 DIAGNOSIS — I83009 Varicose veins of unspecified lower extremity with ulcer of unspecified site: Secondary | ICD-10-CM | POA: Diagnosis not present

## 2011-12-03 DIAGNOSIS — A419 Sepsis, unspecified organism: Secondary | ICD-10-CM | POA: Diagnosis not present

## 2011-12-03 DIAGNOSIS — L97909 Non-pressure chronic ulcer of unspecified part of unspecified lower leg with unspecified severity: Secondary | ICD-10-CM | POA: Diagnosis not present

## 2011-12-03 DIAGNOSIS — N289 Disorder of kidney and ureter, unspecified: Secondary | ICD-10-CM | POA: Diagnosis not present

## 2011-12-04 DIAGNOSIS — I83009 Varicose veins of unspecified lower extremity with ulcer of unspecified site: Secondary | ICD-10-CM | POA: Diagnosis not present

## 2011-12-04 DIAGNOSIS — A419 Sepsis, unspecified organism: Secondary | ICD-10-CM | POA: Diagnosis not present

## 2011-12-04 DIAGNOSIS — R5383 Other fatigue: Secondary | ICD-10-CM | POA: Diagnosis not present

## 2011-12-04 DIAGNOSIS — N289 Disorder of kidney and ureter, unspecified: Secondary | ICD-10-CM | POA: Diagnosis not present

## 2011-12-05 DIAGNOSIS — A419 Sepsis, unspecified organism: Secondary | ICD-10-CM | POA: Diagnosis not present

## 2011-12-05 DIAGNOSIS — R5381 Other malaise: Secondary | ICD-10-CM | POA: Diagnosis not present

## 2011-12-05 DIAGNOSIS — I83009 Varicose veins of unspecified lower extremity with ulcer of unspecified site: Secondary | ICD-10-CM | POA: Diagnosis not present

## 2011-12-05 DIAGNOSIS — N289 Disorder of kidney and ureter, unspecified: Secondary | ICD-10-CM | POA: Diagnosis not present

## 2011-12-05 DIAGNOSIS — D5720 Sickle-cell/Hb-C disease without crisis: Secondary | ICD-10-CM | POA: Diagnosis not present

## 2011-12-13 DIAGNOSIS — N289 Disorder of kidney and ureter, unspecified: Secondary | ICD-10-CM | POA: Diagnosis not present

## 2011-12-13 DIAGNOSIS — A419 Sepsis, unspecified organism: Secondary | ICD-10-CM | POA: Diagnosis not present

## 2011-12-13 DIAGNOSIS — L97909 Non-pressure chronic ulcer of unspecified part of unspecified lower leg with unspecified severity: Secondary | ICD-10-CM | POA: Diagnosis not present

## 2011-12-13 DIAGNOSIS — R5383 Other fatigue: Secondary | ICD-10-CM | POA: Diagnosis not present

## 2011-12-14 DIAGNOSIS — L97919 Non-pressure chronic ulcer of unspecified part of right lower leg with unspecified severity: Secondary | ICD-10-CM | POA: Diagnosis not present

## 2011-12-15 DIAGNOSIS — I10 Essential (primary) hypertension: Secondary | ICD-10-CM | POA: Diagnosis not present

## 2011-12-19 DIAGNOSIS — N289 Disorder of kidney and ureter, unspecified: Secondary | ICD-10-CM | POA: Diagnosis not present

## 2011-12-19 DIAGNOSIS — A419 Sepsis, unspecified organism: Secondary | ICD-10-CM | POA: Diagnosis not present

## 2011-12-19 DIAGNOSIS — I83009 Varicose veins of unspecified lower extremity with ulcer of unspecified site: Secondary | ICD-10-CM | POA: Diagnosis not present

## 2011-12-19 DIAGNOSIS — R5383 Other fatigue: Secondary | ICD-10-CM | POA: Diagnosis not present

## 2011-12-26 DIAGNOSIS — L97909 Non-pressure chronic ulcer of unspecified part of unspecified lower leg with unspecified severity: Secondary | ICD-10-CM | POA: Diagnosis not present

## 2011-12-26 DIAGNOSIS — A419 Sepsis, unspecified organism: Secondary | ICD-10-CM | POA: Diagnosis not present

## 2011-12-26 DIAGNOSIS — N289 Disorder of kidney and ureter, unspecified: Secondary | ICD-10-CM | POA: Diagnosis not present

## 2011-12-26 DIAGNOSIS — R5381 Other malaise: Secondary | ICD-10-CM | POA: Diagnosis not present

## 2012-01-11 DIAGNOSIS — L97929 Non-pressure chronic ulcer of unspecified part of left lower leg with unspecified severity: Secondary | ICD-10-CM | POA: Diagnosis not present

## 2012-01-11 DIAGNOSIS — L97809 Non-pressure chronic ulcer of other part of unspecified lower leg with unspecified severity: Secondary | ICD-10-CM | POA: Diagnosis not present

## 2012-01-11 DIAGNOSIS — L84 Corns and callosities: Secondary | ICD-10-CM | POA: Diagnosis not present

## 2012-01-11 DIAGNOSIS — I83229 Varicose veins of left lower extremity with both ulcer of unspecified site and inflammation: Secondary | ICD-10-CM | POA: Diagnosis not present

## 2012-01-25 DIAGNOSIS — L97809 Non-pressure chronic ulcer of other part of unspecified lower leg with unspecified severity: Secondary | ICD-10-CM | POA: Diagnosis not present

## 2012-01-25 DIAGNOSIS — L97509 Non-pressure chronic ulcer of other part of unspecified foot with unspecified severity: Secondary | ICD-10-CM | POA: Diagnosis not present

## 2012-01-25 DIAGNOSIS — I83219 Varicose veins of right lower extremity with both ulcer of unspecified site and inflammation: Secondary | ICD-10-CM | POA: Diagnosis not present

## 2012-02-01 DIAGNOSIS — I83219 Varicose veins of right lower extremity with both ulcer of unspecified site and inflammation: Secondary | ICD-10-CM | POA: Diagnosis not present

## 2012-02-01 DIAGNOSIS — I83229 Varicose veins of left lower extremity with both ulcer of unspecified site and inflammation: Secondary | ICD-10-CM | POA: Diagnosis not present

## 2012-02-22 DIAGNOSIS — I83219 Varicose veins of right lower extremity with both ulcer of unspecified site and inflammation: Secondary | ICD-10-CM | POA: Diagnosis not present

## 2012-03-14 DIAGNOSIS — L97919 Non-pressure chronic ulcer of unspecified part of right lower leg with unspecified severity: Secondary | ICD-10-CM | POA: Diagnosis not present

## 2012-03-14 DIAGNOSIS — L97309 Non-pressure chronic ulcer of unspecified ankle with unspecified severity: Secondary | ICD-10-CM | POA: Diagnosis not present

## 2012-04-03 DIAGNOSIS — L03039 Cellulitis of unspecified toe: Secondary | ICD-10-CM | POA: Diagnosis not present

## 2012-05-01 DIAGNOSIS — L97909 Non-pressure chronic ulcer of unspecified part of unspecified lower leg with unspecified severity: Secondary | ICD-10-CM | POA: Diagnosis not present

## 2012-05-01 DIAGNOSIS — L97919 Non-pressure chronic ulcer of unspecified part of right lower leg with unspecified severity: Secondary | ICD-10-CM | POA: Diagnosis not present

## 2012-05-01 DIAGNOSIS — I872 Venous insufficiency (chronic) (peripheral): Secondary | ICD-10-CM | POA: Diagnosis not present

## 2012-05-15 DIAGNOSIS — I83229 Varicose veins of left lower extremity with both ulcer of unspecified site and inflammation: Secondary | ICD-10-CM | POA: Diagnosis not present

## 2012-05-15 DIAGNOSIS — L97929 Non-pressure chronic ulcer of unspecified part of left lower leg with unspecified severity: Secondary | ICD-10-CM | POA: Diagnosis not present

## 2012-05-15 DIAGNOSIS — L97909 Non-pressure chronic ulcer of unspecified part of unspecified lower leg with unspecified severity: Secondary | ICD-10-CM | POA: Diagnosis not present

## 2012-05-15 DIAGNOSIS — I83219 Varicose veins of right lower extremity with both ulcer of unspecified site and inflammation: Secondary | ICD-10-CM | POA: Diagnosis not present

## 2012-05-15 DIAGNOSIS — I872 Venous insufficiency (chronic) (peripheral): Secondary | ICD-10-CM | POA: Diagnosis not present

## 2012-05-27 DIAGNOSIS — R922 Inconclusive mammogram: Secondary | ICD-10-CM | POA: Diagnosis not present

## 2012-05-27 DIAGNOSIS — Z1231 Encounter for screening mammogram for malignant neoplasm of breast: Secondary | ICD-10-CM | POA: Diagnosis not present

## 2012-05-30 DIAGNOSIS — B070 Plantar wart: Secondary | ICD-10-CM | POA: Diagnosis not present

## 2012-05-30 DIAGNOSIS — I83229 Varicose veins of left lower extremity with both ulcer of unspecified site and inflammation: Secondary | ICD-10-CM | POA: Diagnosis not present

## 2012-05-30 DIAGNOSIS — I872 Venous insufficiency (chronic) (peripheral): Secondary | ICD-10-CM | POA: Diagnosis not present

## 2012-05-30 DIAGNOSIS — L97909 Non-pressure chronic ulcer of unspecified part of unspecified lower leg with unspecified severity: Secondary | ICD-10-CM | POA: Diagnosis not present

## 2012-05-30 DIAGNOSIS — L97929 Non-pressure chronic ulcer of unspecified part of left lower leg with unspecified severity: Secondary | ICD-10-CM | POA: Diagnosis not present

## 2012-06-04 DIAGNOSIS — R928 Other abnormal and inconclusive findings on diagnostic imaging of breast: Secondary | ICD-10-CM | POA: Diagnosis not present

## 2012-06-04 DIAGNOSIS — N6459 Other signs and symptoms in breast: Secondary | ICD-10-CM | POA: Diagnosis not present

## 2012-06-26 DIAGNOSIS — I83219 Varicose veins of right lower extremity with both ulcer of unspecified site and inflammation: Secondary | ICD-10-CM | POA: Diagnosis not present

## 2012-06-26 DIAGNOSIS — I872 Venous insufficiency (chronic) (peripheral): Secondary | ICD-10-CM | POA: Diagnosis not present

## 2012-06-26 DIAGNOSIS — L97929 Non-pressure chronic ulcer of unspecified part of left lower leg with unspecified severity: Secondary | ICD-10-CM | POA: Diagnosis not present

## 2012-06-26 DIAGNOSIS — L97909 Non-pressure chronic ulcer of unspecified part of unspecified lower leg with unspecified severity: Secondary | ICD-10-CM | POA: Diagnosis not present

## 2012-06-27 DIAGNOSIS — M159 Polyosteoarthritis, unspecified: Secondary | ICD-10-CM | POA: Diagnosis not present

## 2012-07-30 DIAGNOSIS — H2510 Age-related nuclear cataract, unspecified eye: Secondary | ICD-10-CM | POA: Diagnosis not present

## 2012-07-31 DIAGNOSIS — I87339 Chronic venous hypertension (idiopathic) with ulcer and inflammation of unspecified lower extremity: Secondary | ICD-10-CM | POA: Diagnosis not present

## 2012-07-31 DIAGNOSIS — L97929 Non-pressure chronic ulcer of unspecified part of left lower leg with unspecified severity: Secondary | ICD-10-CM | POA: Diagnosis not present

## 2012-07-31 DIAGNOSIS — I83219 Varicose veins of right lower extremity with both ulcer of unspecified site and inflammation: Secondary | ICD-10-CM | POA: Diagnosis not present

## 2012-08-21 DIAGNOSIS — L600 Ingrowing nail: Secondary | ICD-10-CM | POA: Diagnosis not present

## 2012-08-21 DIAGNOSIS — L608 Other nail disorders: Secondary | ICD-10-CM | POA: Diagnosis not present

## 2012-08-21 DIAGNOSIS — M2010 Hallux valgus (acquired), unspecified foot: Secondary | ICD-10-CM | POA: Diagnosis not present

## 2012-08-21 DIAGNOSIS — M79609 Pain in unspecified limb: Secondary | ICD-10-CM | POA: Diagnosis not present

## 2012-08-28 DIAGNOSIS — L84 Corns and callosities: Secondary | ICD-10-CM | POA: Diagnosis not present

## 2012-08-28 DIAGNOSIS — L97919 Non-pressure chronic ulcer of unspecified part of right lower leg with unspecified severity: Secondary | ICD-10-CM | POA: Diagnosis not present

## 2012-09-25 DIAGNOSIS — L02419 Cutaneous abscess of limb, unspecified: Secondary | ICD-10-CM | POA: Diagnosis not present

## 2012-09-25 DIAGNOSIS — I83219 Varicose veins of right lower extremity with both ulcer of unspecified site and inflammation: Secondary | ICD-10-CM | POA: Diagnosis not present

## 2012-09-25 DIAGNOSIS — L97909 Non-pressure chronic ulcer of unspecified part of unspecified lower leg with unspecified severity: Secondary | ICD-10-CM | POA: Diagnosis not present

## 2012-09-27 DIAGNOSIS — Z01818 Encounter for other preprocedural examination: Secondary | ICD-10-CM | POA: Diagnosis not present

## 2012-09-27 DIAGNOSIS — I10 Essential (primary) hypertension: Secondary | ICD-10-CM | POA: Diagnosis not present

## 2012-09-27 DIAGNOSIS — Z0000 Encounter for general adult medical examination without abnormal findings: Secondary | ICD-10-CM | POA: Diagnosis not present

## 2012-10-15 DIAGNOSIS — I83219 Varicose veins of right lower extremity with both ulcer of unspecified site and inflammation: Secondary | ICD-10-CM | POA: Diagnosis not present

## 2012-10-22 DIAGNOSIS — I83219 Varicose veins of right lower extremity with both ulcer of unspecified site and inflammation: Secondary | ICD-10-CM | POA: Diagnosis not present

## 2012-10-22 DIAGNOSIS — L02419 Cutaneous abscess of limb, unspecified: Secondary | ICD-10-CM | POA: Diagnosis not present

## 2012-11-01 DIAGNOSIS — Z23 Encounter for immunization: Secondary | ICD-10-CM | POA: Diagnosis not present

## 2012-11-19 DIAGNOSIS — L97909 Non-pressure chronic ulcer of unspecified part of unspecified lower leg with unspecified severity: Secondary | ICD-10-CM | POA: Diagnosis not present

## 2012-11-19 DIAGNOSIS — I83219 Varicose veins of right lower extremity with both ulcer of unspecified site and inflammation: Secondary | ICD-10-CM | POA: Diagnosis not present

## 2012-11-27 DIAGNOSIS — M79609 Pain in unspecified limb: Secondary | ICD-10-CM | POA: Diagnosis not present

## 2012-11-27 DIAGNOSIS — Q828 Other specified congenital malformations of skin: Secondary | ICD-10-CM | POA: Diagnosis not present

## 2012-11-27 DIAGNOSIS — M2010 Hallux valgus (acquired), unspecified foot: Secondary | ICD-10-CM | POA: Diagnosis not present

## 2012-11-27 DIAGNOSIS — L608 Other nail disorders: Secondary | ICD-10-CM | POA: Diagnosis not present

## 2012-11-27 DIAGNOSIS — L600 Ingrowing nail: Secondary | ICD-10-CM | POA: Diagnosis not present

## 2012-12-27 DIAGNOSIS — M778 Other enthesopathies, not elsewhere classified: Secondary | ICD-10-CM | POA: Diagnosis not present

## 2012-12-30 DIAGNOSIS — L97809 Non-pressure chronic ulcer of other part of unspecified lower leg with unspecified severity: Secondary | ICD-10-CM | POA: Diagnosis not present

## 2012-12-30 DIAGNOSIS — L089 Local infection of the skin and subcutaneous tissue, unspecified: Secondary | ICD-10-CM | POA: Diagnosis not present

## 2012-12-30 DIAGNOSIS — M24569 Contracture, unspecified knee: Secondary | ICD-10-CM | POA: Diagnosis not present

## 2012-12-30 DIAGNOSIS — I83219 Varicose veins of right lower extremity with both ulcer of unspecified site and inflammation: Secondary | ICD-10-CM | POA: Diagnosis not present

## 2013-01-30 DIAGNOSIS — I83219 Varicose veins of right lower extremity with both ulcer of unspecified site and inflammation: Secondary | ICD-10-CM | POA: Diagnosis not present

## 2013-01-30 DIAGNOSIS — B070 Plantar wart: Secondary | ICD-10-CM | POA: Diagnosis not present

## 2013-01-30 DIAGNOSIS — L97929 Non-pressure chronic ulcer of unspecified part of left lower leg with unspecified severity: Secondary | ICD-10-CM | POA: Diagnosis not present

## 2013-01-30 DIAGNOSIS — L97309 Non-pressure chronic ulcer of unspecified ankle with unspecified severity: Secondary | ICD-10-CM | POA: Diagnosis not present

## 2013-02-03 DIAGNOSIS — H2510 Age-related nuclear cataract, unspecified eye: Secondary | ICD-10-CM | POA: Diagnosis not present

## 2013-02-03 DIAGNOSIS — Z961 Presence of intraocular lens: Secondary | ICD-10-CM | POA: Diagnosis not present

## 2013-02-26 DIAGNOSIS — L600 Ingrowing nail: Secondary | ICD-10-CM | POA: Diagnosis not present

## 2013-02-26 DIAGNOSIS — L608 Other nail disorders: Secondary | ICD-10-CM | POA: Diagnosis not present

## 2013-02-26 DIAGNOSIS — M79609 Pain in unspecified limb: Secondary | ICD-10-CM | POA: Diagnosis not present

## 2013-02-26 DIAGNOSIS — Q828 Other specified congenital malformations of skin: Secondary | ICD-10-CM | POA: Diagnosis not present

## 2013-02-26 DIAGNOSIS — M24573 Contracture, unspecified ankle: Secondary | ICD-10-CM | POA: Diagnosis not present

## 2013-02-27 DIAGNOSIS — I83219 Varicose veins of right lower extremity with both ulcer of unspecified site and inflammation: Secondary | ICD-10-CM | POA: Diagnosis not present

## 2013-02-27 DIAGNOSIS — L97909 Non-pressure chronic ulcer of unspecified part of unspecified lower leg with unspecified severity: Secondary | ICD-10-CM | POA: Diagnosis not present

## 2013-02-27 DIAGNOSIS — I872 Venous insufficiency (chronic) (peripheral): Secondary | ICD-10-CM | POA: Diagnosis not present

## 2013-02-27 DIAGNOSIS — I83229 Varicose veins of left lower extremity with both ulcer of unspecified site and inflammation: Secondary | ICD-10-CM | POA: Diagnosis not present

## 2013-03-31 DIAGNOSIS — I10 Essential (primary) hypertension: Secondary | ICD-10-CM | POA: Diagnosis not present

## 2013-03-31 DIAGNOSIS — Z131 Encounter for screening for diabetes mellitus: Secondary | ICD-10-CM | POA: Diagnosis not present

## 2013-04-01 DIAGNOSIS — L97809 Non-pressure chronic ulcer of other part of unspecified lower leg with unspecified severity: Secondary | ICD-10-CM | POA: Diagnosis not present

## 2013-04-01 DIAGNOSIS — I83219 Varicose veins of right lower extremity with both ulcer of unspecified site and inflammation: Secondary | ICD-10-CM | POA: Diagnosis not present

## 2013-04-16 DIAGNOSIS — Z1211 Encounter for screening for malignant neoplasm of colon: Secondary | ICD-10-CM | POA: Diagnosis not present

## 2013-04-29 DIAGNOSIS — L97809 Non-pressure chronic ulcer of other part of unspecified lower leg with unspecified severity: Secondary | ICD-10-CM | POA: Diagnosis not present

## 2013-04-29 DIAGNOSIS — L97929 Non-pressure chronic ulcer of unspecified part of left lower leg with unspecified severity: Secondary | ICD-10-CM | POA: Diagnosis not present

## 2013-04-29 DIAGNOSIS — I83219 Varicose veins of right lower extremity with both ulcer of unspecified site and inflammation: Secondary | ICD-10-CM | POA: Diagnosis not present

## 2013-05-08 DIAGNOSIS — Z79899 Other long term (current) drug therapy: Secondary | ICD-10-CM | POA: Diagnosis not present

## 2013-05-08 DIAGNOSIS — Z801 Family history of malignant neoplasm of trachea, bronchus and lung: Secondary | ICD-10-CM | POA: Diagnosis not present

## 2013-05-08 DIAGNOSIS — Z1211 Encounter for screening for malignant neoplasm of colon: Secondary | ICD-10-CM | POA: Diagnosis not present

## 2013-05-08 DIAGNOSIS — I10 Essential (primary) hypertension: Secondary | ICD-10-CM | POA: Diagnosis not present

## 2013-05-08 DIAGNOSIS — Z800 Family history of malignant neoplasm of digestive organs: Secondary | ICD-10-CM | POA: Diagnosis not present

## 2013-05-08 DIAGNOSIS — M129 Arthropathy, unspecified: Secondary | ICD-10-CM | POA: Diagnosis not present

## 2013-05-28 DIAGNOSIS — M24576 Contracture, unspecified foot: Secondary | ICD-10-CM | POA: Diagnosis not present

## 2013-05-28 DIAGNOSIS — M24573 Contracture, unspecified ankle: Secondary | ICD-10-CM | POA: Diagnosis not present

## 2013-05-28 DIAGNOSIS — L600 Ingrowing nail: Secondary | ICD-10-CM | POA: Diagnosis not present

## 2013-05-28 DIAGNOSIS — M79609 Pain in unspecified limb: Secondary | ICD-10-CM | POA: Diagnosis not present

## 2013-05-28 DIAGNOSIS — M2010 Hallux valgus (acquired), unspecified foot: Secondary | ICD-10-CM | POA: Diagnosis not present

## 2013-05-28 DIAGNOSIS — Q828 Other specified congenital malformations of skin: Secondary | ICD-10-CM | POA: Diagnosis not present

## 2013-07-01 DIAGNOSIS — I10 Essential (primary) hypertension: Secondary | ICD-10-CM | POA: Diagnosis not present

## 2013-07-28 DIAGNOSIS — L97909 Non-pressure chronic ulcer of unspecified part of unspecified lower leg with unspecified severity: Secondary | ICD-10-CM | POA: Diagnosis not present

## 2013-08-20 DIAGNOSIS — L608 Other nail disorders: Secondary | ICD-10-CM | POA: Diagnosis not present

## 2013-08-20 DIAGNOSIS — M24573 Contracture, unspecified ankle: Secondary | ICD-10-CM | POA: Diagnosis not present

## 2013-08-20 DIAGNOSIS — M79609 Pain in unspecified limb: Secondary | ICD-10-CM | POA: Diagnosis not present

## 2013-08-20 DIAGNOSIS — L600 Ingrowing nail: Secondary | ICD-10-CM | POA: Diagnosis not present

## 2013-08-20 DIAGNOSIS — M24576 Contracture, unspecified foot: Secondary | ICD-10-CM | POA: Diagnosis not present

## 2013-08-20 DIAGNOSIS — M2010 Hallux valgus (acquired), unspecified foot: Secondary | ICD-10-CM | POA: Diagnosis not present

## 2013-08-26 DIAGNOSIS — Z01818 Encounter for other preprocedural examination: Secondary | ICD-10-CM | POA: Diagnosis not present

## 2013-08-26 DIAGNOSIS — L97909 Non-pressure chronic ulcer of unspecified part of unspecified lower leg with unspecified severity: Secondary | ICD-10-CM | POA: Diagnosis not present

## 2013-09-04 DIAGNOSIS — T82598A Other mechanical complication of other cardiac and vascular devices and implants, initial encounter: Secondary | ICD-10-CM | POA: Diagnosis not present

## 2013-09-04 DIAGNOSIS — L97909 Non-pressure chronic ulcer of unspecified part of unspecified lower leg with unspecified severity: Secondary | ICD-10-CM | POA: Diagnosis not present

## 2013-09-04 DIAGNOSIS — S98119A Complete traumatic amputation of unspecified great toe, initial encounter: Secondary | ICD-10-CM | POA: Diagnosis not present

## 2013-09-04 DIAGNOSIS — T85693A Other mechanical complication of artificial skin graft and decellularized allodermis, initial encounter: Secondary | ICD-10-CM | POA: Diagnosis not present

## 2013-09-04 DIAGNOSIS — R262 Difficulty in walking, not elsewhere classified: Secondary | ICD-10-CM | POA: Diagnosis not present

## 2013-09-04 DIAGNOSIS — R5381 Other malaise: Secondary | ICD-10-CM | POA: Diagnosis present

## 2013-09-04 DIAGNOSIS — Z6834 Body mass index (BMI) 34.0-34.9, adult: Secondary | ICD-10-CM | POA: Diagnosis not present

## 2013-09-04 DIAGNOSIS — L97809 Non-pressure chronic ulcer of other part of unspecified lower leg with unspecified severity: Secondary | ICD-10-CM | POA: Diagnosis not present

## 2013-09-04 DIAGNOSIS — S98139A Complete traumatic amputation of one unspecified lesser toe, initial encounter: Secondary | ICD-10-CM | POA: Diagnosis not present

## 2013-09-04 DIAGNOSIS — M2459 Contracture, other specified joint: Secondary | ICD-10-CM | POA: Diagnosis not present

## 2013-09-04 DIAGNOSIS — I10 Essential (primary) hypertension: Secondary | ICD-10-CM | POA: Diagnosis not present

## 2013-09-04 DIAGNOSIS — D649 Anemia, unspecified: Secondary | ICD-10-CM | POA: Diagnosis present

## 2013-09-04 DIAGNOSIS — IMO0002 Reserved for concepts with insufficient information to code with codable children: Secondary | ICD-10-CM | POA: Diagnosis not present

## 2013-09-04 DIAGNOSIS — I872 Venous insufficiency (chronic) (peripheral): Secondary | ICD-10-CM | POA: Diagnosis not present

## 2013-09-04 DIAGNOSIS — L97509 Non-pressure chronic ulcer of other part of unspecified foot with unspecified severity: Secondary | ICD-10-CM | POA: Diagnosis not present

## 2013-09-04 DIAGNOSIS — Z880 Allergy status to penicillin: Secondary | ICD-10-CM | POA: Diagnosis not present

## 2013-09-04 DIAGNOSIS — Z7401 Bed confinement status: Secondary | ICD-10-CM | POA: Diagnosis not present

## 2013-09-04 DIAGNOSIS — M2550 Pain in unspecified joint: Secondary | ICD-10-CM | POA: Diagnosis not present

## 2013-09-04 DIAGNOSIS — M1710 Unilateral primary osteoarthritis, unspecified knee: Secondary | ICD-10-CM | POA: Diagnosis not present

## 2013-09-04 DIAGNOSIS — Z8739 Personal history of other diseases of the musculoskeletal system and connective tissue: Secondary | ICD-10-CM | POA: Diagnosis not present

## 2013-09-04 DIAGNOSIS — Z5189 Encounter for other specified aftercare: Secondary | ICD-10-CM | POA: Diagnosis not present

## 2013-09-04 DIAGNOSIS — Z96649 Presence of unspecified artificial hip joint: Secondary | ICD-10-CM | POA: Diagnosis not present

## 2013-09-04 DIAGNOSIS — R279 Unspecified lack of coordination: Secondary | ICD-10-CM | POA: Diagnosis not present

## 2013-09-04 DIAGNOSIS — L98499 Non-pressure chronic ulcer of skin of other sites with unspecified severity: Secondary | ICD-10-CM | POA: Diagnosis not present

## 2013-09-04 DIAGNOSIS — R5383 Other fatigue: Secondary | ICD-10-CM | POA: Diagnosis present

## 2013-09-04 DIAGNOSIS — Z886 Allergy status to analgesic agent status: Secondary | ICD-10-CM | POA: Diagnosis not present

## 2013-09-04 DIAGNOSIS — Z79899 Other long term (current) drug therapy: Secondary | ICD-10-CM | POA: Diagnosis not present

## 2013-09-09 DIAGNOSIS — I872 Venous insufficiency (chronic) (peripheral): Secondary | ICD-10-CM | POA: Diagnosis not present

## 2013-09-09 DIAGNOSIS — Z886 Allergy status to analgesic agent status: Secondary | ICD-10-CM | POA: Diagnosis not present

## 2013-09-09 DIAGNOSIS — L97809 Non-pressure chronic ulcer of other part of unspecified lower leg with unspecified severity: Secondary | ICD-10-CM | POA: Diagnosis not present

## 2013-09-09 DIAGNOSIS — Z880 Allergy status to penicillin: Secondary | ICD-10-CM | POA: Diagnosis not present

## 2013-09-09 DIAGNOSIS — L97509 Non-pressure chronic ulcer of other part of unspecified foot with unspecified severity: Secondary | ICD-10-CM | POA: Diagnosis not present

## 2013-09-10 DIAGNOSIS — L98499 Non-pressure chronic ulcer of skin of other sites with unspecified severity: Secondary | ICD-10-CM | POA: Diagnosis not present

## 2013-09-10 DIAGNOSIS — Z96649 Presence of unspecified artificial hip joint: Secondary | ICD-10-CM | POA: Diagnosis not present

## 2013-09-10 DIAGNOSIS — M2550 Pain in unspecified joint: Secondary | ICD-10-CM | POA: Diagnosis not present

## 2013-09-10 DIAGNOSIS — S98139A Complete traumatic amputation of one unspecified lesser toe, initial encounter: Secondary | ICD-10-CM | POA: Diagnosis not present

## 2013-09-10 DIAGNOSIS — T82598A Other mechanical complication of other cardiac and vascular devices and implants, initial encounter: Secondary | ICD-10-CM | POA: Diagnosis not present

## 2013-09-10 DIAGNOSIS — T85693A Other mechanical complication of artificial skin graft and decellularized allodermis, initial encounter: Secondary | ICD-10-CM | POA: Diagnosis not present

## 2013-09-10 DIAGNOSIS — Z7401 Bed confinement status: Secondary | ICD-10-CM | POA: Diagnosis not present

## 2013-09-10 DIAGNOSIS — R262 Difficulty in walking, not elsewhere classified: Secondary | ICD-10-CM | POA: Diagnosis not present

## 2013-09-10 DIAGNOSIS — R279 Unspecified lack of coordination: Secondary | ICD-10-CM | POA: Diagnosis not present

## 2013-09-10 DIAGNOSIS — Z5189 Encounter for other specified aftercare: Secondary | ICD-10-CM | POA: Diagnosis not present

## 2013-09-10 DIAGNOSIS — Z8739 Personal history of other diseases of the musculoskeletal system and connective tissue: Secondary | ICD-10-CM | POA: Diagnosis not present

## 2013-09-10 DIAGNOSIS — S98119A Complete traumatic amputation of unspecified great toe, initial encounter: Secondary | ICD-10-CM | POA: Diagnosis not present

## 2013-09-11 DIAGNOSIS — S98119A Complete traumatic amputation of unspecified great toe, initial encounter: Secondary | ICD-10-CM | POA: Diagnosis not present

## 2013-09-11 DIAGNOSIS — T85693A Other mechanical complication of artificial skin graft and decellularized allodermis, initial encounter: Secondary | ICD-10-CM | POA: Diagnosis not present

## 2013-09-11 DIAGNOSIS — Z5189 Encounter for other specified aftercare: Secondary | ICD-10-CM | POA: Diagnosis not present

## 2013-09-11 DIAGNOSIS — Z96649 Presence of unspecified artificial hip joint: Secondary | ICD-10-CM | POA: Diagnosis not present

## 2013-09-11 DIAGNOSIS — Z8739 Personal history of other diseases of the musculoskeletal system and connective tissue: Secondary | ICD-10-CM | POA: Diagnosis not present

## 2013-09-12 DIAGNOSIS — Z96649 Presence of unspecified artificial hip joint: Secondary | ICD-10-CM | POA: Diagnosis not present

## 2013-09-12 DIAGNOSIS — Z8739 Personal history of other diseases of the musculoskeletal system and connective tissue: Secondary | ICD-10-CM | POA: Diagnosis not present

## 2013-09-12 DIAGNOSIS — T85693A Other mechanical complication of artificial skin graft and decellularized allodermis, initial encounter: Secondary | ICD-10-CM | POA: Diagnosis not present

## 2013-09-12 DIAGNOSIS — S98119A Complete traumatic amputation of unspecified great toe, initial encounter: Secondary | ICD-10-CM | POA: Diagnosis not present

## 2013-09-12 DIAGNOSIS — Z5189 Encounter for other specified aftercare: Secondary | ICD-10-CM | POA: Diagnosis not present

## 2013-09-13 DIAGNOSIS — Z8739 Personal history of other diseases of the musculoskeletal system and connective tissue: Secondary | ICD-10-CM | POA: Diagnosis not present

## 2013-09-13 DIAGNOSIS — Z5189 Encounter for other specified aftercare: Secondary | ICD-10-CM | POA: Diagnosis not present

## 2013-09-13 DIAGNOSIS — S98119A Complete traumatic amputation of unspecified great toe, initial encounter: Secondary | ICD-10-CM | POA: Diagnosis not present

## 2013-09-13 DIAGNOSIS — Z96649 Presence of unspecified artificial hip joint: Secondary | ICD-10-CM | POA: Diagnosis not present

## 2013-09-13 DIAGNOSIS — T85693A Other mechanical complication of artificial skin graft and decellularized allodermis, initial encounter: Secondary | ICD-10-CM | POA: Diagnosis not present

## 2013-09-15 DIAGNOSIS — Z96649 Presence of unspecified artificial hip joint: Secondary | ICD-10-CM | POA: Diagnosis not present

## 2013-09-15 DIAGNOSIS — S98119A Complete traumatic amputation of unspecified great toe, initial encounter: Secondary | ICD-10-CM | POA: Diagnosis not present

## 2013-09-15 DIAGNOSIS — T85693A Other mechanical complication of artificial skin graft and decellularized allodermis, initial encounter: Secondary | ICD-10-CM | POA: Diagnosis not present

## 2013-09-15 DIAGNOSIS — Z8739 Personal history of other diseases of the musculoskeletal system and connective tissue: Secondary | ICD-10-CM | POA: Diagnosis not present

## 2013-09-15 DIAGNOSIS — Z5189 Encounter for other specified aftercare: Secondary | ICD-10-CM | POA: Diagnosis not present

## 2013-09-16 DIAGNOSIS — Z96649 Presence of unspecified artificial hip joint: Secondary | ICD-10-CM | POA: Diagnosis not present

## 2013-09-16 DIAGNOSIS — Z5189 Encounter for other specified aftercare: Secondary | ICD-10-CM | POA: Diagnosis not present

## 2013-09-16 DIAGNOSIS — Z8739 Personal history of other diseases of the musculoskeletal system and connective tissue: Secondary | ICD-10-CM | POA: Diagnosis not present

## 2013-09-16 DIAGNOSIS — T85693A Other mechanical complication of artificial skin graft and decellularized allodermis, initial encounter: Secondary | ICD-10-CM | POA: Diagnosis not present

## 2013-09-16 DIAGNOSIS — S98119A Complete traumatic amputation of unspecified great toe, initial encounter: Secondary | ICD-10-CM | POA: Diagnosis not present

## 2013-09-17 DIAGNOSIS — Z5189 Encounter for other specified aftercare: Secondary | ICD-10-CM | POA: Diagnosis not present

## 2013-09-17 DIAGNOSIS — T85693A Other mechanical complication of artificial skin graft and decellularized allodermis, initial encounter: Secondary | ICD-10-CM | POA: Diagnosis not present

## 2013-09-17 DIAGNOSIS — Z8739 Personal history of other diseases of the musculoskeletal system and connective tissue: Secondary | ICD-10-CM | POA: Diagnosis not present

## 2013-09-17 DIAGNOSIS — S98119A Complete traumatic amputation of unspecified great toe, initial encounter: Secondary | ICD-10-CM | POA: Diagnosis not present

## 2013-09-17 DIAGNOSIS — Z96649 Presence of unspecified artificial hip joint: Secondary | ICD-10-CM | POA: Diagnosis not present

## 2013-09-18 DIAGNOSIS — Z8739 Personal history of other diseases of the musculoskeletal system and connective tissue: Secondary | ICD-10-CM | POA: Diagnosis not present

## 2013-09-18 DIAGNOSIS — S98119A Complete traumatic amputation of unspecified great toe, initial encounter: Secondary | ICD-10-CM | POA: Diagnosis not present

## 2013-09-18 DIAGNOSIS — Z5189 Encounter for other specified aftercare: Secondary | ICD-10-CM | POA: Diagnosis not present

## 2013-09-18 DIAGNOSIS — Z96649 Presence of unspecified artificial hip joint: Secondary | ICD-10-CM | POA: Diagnosis not present

## 2013-09-18 DIAGNOSIS — T85693A Other mechanical complication of artificial skin graft and decellularized allodermis, initial encounter: Secondary | ICD-10-CM | POA: Diagnosis not present

## 2013-09-19 DIAGNOSIS — Z8739 Personal history of other diseases of the musculoskeletal system and connective tissue: Secondary | ICD-10-CM | POA: Diagnosis not present

## 2013-09-19 DIAGNOSIS — Z5189 Encounter for other specified aftercare: Secondary | ICD-10-CM | POA: Diagnosis not present

## 2013-09-19 DIAGNOSIS — S98119A Complete traumatic amputation of unspecified great toe, initial encounter: Secondary | ICD-10-CM | POA: Diagnosis not present

## 2013-09-19 DIAGNOSIS — T85693A Other mechanical complication of artificial skin graft and decellularized allodermis, initial encounter: Secondary | ICD-10-CM | POA: Diagnosis not present

## 2013-09-19 DIAGNOSIS — Z96649 Presence of unspecified artificial hip joint: Secondary | ICD-10-CM | POA: Diagnosis not present

## 2013-09-22 DIAGNOSIS — T85693A Other mechanical complication of artificial skin graft and decellularized allodermis, initial encounter: Secondary | ICD-10-CM | POA: Diagnosis not present

## 2013-09-22 DIAGNOSIS — S98119A Complete traumatic amputation of unspecified great toe, initial encounter: Secondary | ICD-10-CM | POA: Diagnosis not present

## 2013-09-22 DIAGNOSIS — Z5189 Encounter for other specified aftercare: Secondary | ICD-10-CM | POA: Diagnosis not present

## 2013-09-22 DIAGNOSIS — Z96649 Presence of unspecified artificial hip joint: Secondary | ICD-10-CM | POA: Diagnosis not present

## 2013-09-22 DIAGNOSIS — Z8739 Personal history of other diseases of the musculoskeletal system and connective tissue: Secondary | ICD-10-CM | POA: Diagnosis not present

## 2013-09-23 DIAGNOSIS — T85693A Other mechanical complication of artificial skin graft and decellularized allodermis, initial encounter: Secondary | ICD-10-CM | POA: Diagnosis not present

## 2013-09-23 DIAGNOSIS — M2459 Contracture, other specified joint: Secondary | ICD-10-CM | POA: Diagnosis not present

## 2013-09-23 DIAGNOSIS — R262 Difficulty in walking, not elsewhere classified: Secondary | ICD-10-CM | POA: Diagnosis not present

## 2013-09-23 DIAGNOSIS — R279 Unspecified lack of coordination: Secondary | ICD-10-CM | POA: Diagnosis not present

## 2013-09-23 DIAGNOSIS — Z8739 Personal history of other diseases of the musculoskeletal system and connective tissue: Secondary | ICD-10-CM | POA: Diagnosis not present

## 2013-09-23 DIAGNOSIS — Z96649 Presence of unspecified artificial hip joint: Secondary | ICD-10-CM | POA: Diagnosis not present

## 2013-09-23 DIAGNOSIS — L98499 Non-pressure chronic ulcer of skin of other sites with unspecified severity: Secondary | ICD-10-CM | POA: Diagnosis not present

## 2013-09-23 DIAGNOSIS — S98119A Complete traumatic amputation of unspecified great toe, initial encounter: Secondary | ICD-10-CM | POA: Diagnosis not present

## 2013-09-24 DIAGNOSIS — L98499 Non-pressure chronic ulcer of skin of other sites with unspecified severity: Secondary | ICD-10-CM | POA: Diagnosis not present

## 2013-09-24 DIAGNOSIS — T85693A Other mechanical complication of artificial skin graft and decellularized allodermis, initial encounter: Secondary | ICD-10-CM | POA: Diagnosis not present

## 2013-09-24 DIAGNOSIS — Z96649 Presence of unspecified artificial hip joint: Secondary | ICD-10-CM | POA: Diagnosis not present

## 2013-09-24 DIAGNOSIS — Z8739 Personal history of other diseases of the musculoskeletal system and connective tissue: Secondary | ICD-10-CM | POA: Diagnosis not present

## 2013-09-24 DIAGNOSIS — S98119A Complete traumatic amputation of unspecified great toe, initial encounter: Secondary | ICD-10-CM | POA: Diagnosis not present

## 2013-09-25 DIAGNOSIS — L98499 Non-pressure chronic ulcer of skin of other sites with unspecified severity: Secondary | ICD-10-CM | POA: Diagnosis not present

## 2013-09-25 DIAGNOSIS — T85693A Other mechanical complication of artificial skin graft and decellularized allodermis, initial encounter: Secondary | ICD-10-CM | POA: Diagnosis not present

## 2013-09-25 DIAGNOSIS — Z8739 Personal history of other diseases of the musculoskeletal system and connective tissue: Secondary | ICD-10-CM | POA: Diagnosis not present

## 2013-09-25 DIAGNOSIS — S98119A Complete traumatic amputation of unspecified great toe, initial encounter: Secondary | ICD-10-CM | POA: Diagnosis not present

## 2013-09-25 DIAGNOSIS — Z96649 Presence of unspecified artificial hip joint: Secondary | ICD-10-CM | POA: Diagnosis not present

## 2013-09-26 DIAGNOSIS — Z8739 Personal history of other diseases of the musculoskeletal system and connective tissue: Secondary | ICD-10-CM | POA: Diagnosis not present

## 2013-09-26 DIAGNOSIS — Z96649 Presence of unspecified artificial hip joint: Secondary | ICD-10-CM | POA: Diagnosis not present

## 2013-09-26 DIAGNOSIS — T85693A Other mechanical complication of artificial skin graft and decellularized allodermis, initial encounter: Secondary | ICD-10-CM | POA: Diagnosis not present

## 2013-09-26 DIAGNOSIS — S98119A Complete traumatic amputation of unspecified great toe, initial encounter: Secondary | ICD-10-CM | POA: Diagnosis not present

## 2013-09-26 DIAGNOSIS — L98499 Non-pressure chronic ulcer of skin of other sites with unspecified severity: Secondary | ICD-10-CM | POA: Diagnosis not present

## 2013-09-29 DIAGNOSIS — T85693A Other mechanical complication of artificial skin graft and decellularized allodermis, initial encounter: Secondary | ICD-10-CM | POA: Diagnosis not present

## 2013-09-29 DIAGNOSIS — S98119A Complete traumatic amputation of unspecified great toe, initial encounter: Secondary | ICD-10-CM | POA: Diagnosis not present

## 2013-09-29 DIAGNOSIS — L98499 Non-pressure chronic ulcer of skin of other sites with unspecified severity: Secondary | ICD-10-CM | POA: Diagnosis not present

## 2013-09-29 DIAGNOSIS — Z8739 Personal history of other diseases of the musculoskeletal system and connective tissue: Secondary | ICD-10-CM | POA: Diagnosis not present

## 2013-09-29 DIAGNOSIS — Z96649 Presence of unspecified artificial hip joint: Secondary | ICD-10-CM | POA: Diagnosis not present

## 2013-09-30 DIAGNOSIS — L98499 Non-pressure chronic ulcer of skin of other sites with unspecified severity: Secondary | ICD-10-CM | POA: Diagnosis not present

## 2013-09-30 DIAGNOSIS — Z96649 Presence of unspecified artificial hip joint: Secondary | ICD-10-CM | POA: Diagnosis not present

## 2013-09-30 DIAGNOSIS — Z8739 Personal history of other diseases of the musculoskeletal system and connective tissue: Secondary | ICD-10-CM | POA: Diagnosis not present

## 2013-09-30 DIAGNOSIS — S98119A Complete traumatic amputation of unspecified great toe, initial encounter: Secondary | ICD-10-CM | POA: Diagnosis not present

## 2013-09-30 DIAGNOSIS — T85693A Other mechanical complication of artificial skin graft and decellularized allodermis, initial encounter: Secondary | ICD-10-CM | POA: Diagnosis not present

## 2013-09-30 DIAGNOSIS — M7989 Other specified soft tissue disorders: Secondary | ICD-10-CM | POA: Diagnosis not present

## 2013-10-01 DIAGNOSIS — L98499 Non-pressure chronic ulcer of skin of other sites with unspecified severity: Secondary | ICD-10-CM | POA: Diagnosis not present

## 2013-10-01 DIAGNOSIS — Z96649 Presence of unspecified artificial hip joint: Secondary | ICD-10-CM | POA: Diagnosis not present

## 2013-10-01 DIAGNOSIS — S98119A Complete traumatic amputation of unspecified great toe, initial encounter: Secondary | ICD-10-CM | POA: Diagnosis not present

## 2013-10-01 DIAGNOSIS — Z8739 Personal history of other diseases of the musculoskeletal system and connective tissue: Secondary | ICD-10-CM | POA: Diagnosis not present

## 2013-10-01 DIAGNOSIS — T85693A Other mechanical complication of artificial skin graft and decellularized allodermis, initial encounter: Secondary | ICD-10-CM | POA: Diagnosis not present

## 2013-10-02 DIAGNOSIS — S98119A Complete traumatic amputation of unspecified great toe, initial encounter: Secondary | ICD-10-CM | POA: Diagnosis not present

## 2013-10-02 DIAGNOSIS — L98499 Non-pressure chronic ulcer of skin of other sites with unspecified severity: Secondary | ICD-10-CM | POA: Diagnosis not present

## 2013-10-02 DIAGNOSIS — T85693A Other mechanical complication of artificial skin graft and decellularized allodermis, initial encounter: Secondary | ICD-10-CM | POA: Diagnosis not present

## 2013-10-02 DIAGNOSIS — Z96649 Presence of unspecified artificial hip joint: Secondary | ICD-10-CM | POA: Diagnosis not present

## 2013-10-02 DIAGNOSIS — Z8739 Personal history of other diseases of the musculoskeletal system and connective tissue: Secondary | ICD-10-CM | POA: Diagnosis not present

## 2013-10-03 DIAGNOSIS — L98499 Non-pressure chronic ulcer of skin of other sites with unspecified severity: Secondary | ICD-10-CM | POA: Diagnosis not present

## 2013-10-03 DIAGNOSIS — T85693A Other mechanical complication of artificial skin graft and decellularized allodermis, initial encounter: Secondary | ICD-10-CM | POA: Diagnosis not present

## 2013-10-03 DIAGNOSIS — Z96649 Presence of unspecified artificial hip joint: Secondary | ICD-10-CM | POA: Diagnosis not present

## 2013-10-03 DIAGNOSIS — S98119A Complete traumatic amputation of unspecified great toe, initial encounter: Secondary | ICD-10-CM | POA: Diagnosis not present

## 2013-10-03 DIAGNOSIS — Z8739 Personal history of other diseases of the musculoskeletal system and connective tissue: Secondary | ICD-10-CM | POA: Diagnosis not present

## 2013-10-07 DIAGNOSIS — I10 Essential (primary) hypertension: Secondary | ICD-10-CM | POA: Diagnosis not present

## 2013-10-07 DIAGNOSIS — Z791 Long term (current) use of non-steroidal anti-inflammatories (NSAID): Secondary | ICD-10-CM | POA: Diagnosis not present

## 2013-10-07 DIAGNOSIS — Z792 Long term (current) use of antibiotics: Secondary | ICD-10-CM | POA: Diagnosis not present

## 2013-10-07 DIAGNOSIS — T85693D Other mechanical complication of artificial skin graft and decellularized allodermis, subsequent encounter: Secondary | ICD-10-CM | POA: Diagnosis not present

## 2013-10-07 DIAGNOSIS — R32 Unspecified urinary incontinence: Secondary | ICD-10-CM | POA: Diagnosis not present

## 2013-10-07 DIAGNOSIS — E669 Obesity, unspecified: Secondary | ICD-10-CM | POA: Diagnosis not present

## 2013-10-08 DIAGNOSIS — E669 Obesity, unspecified: Secondary | ICD-10-CM | POA: Diagnosis not present

## 2013-10-08 DIAGNOSIS — Z792 Long term (current) use of antibiotics: Secondary | ICD-10-CM | POA: Diagnosis not present

## 2013-10-08 DIAGNOSIS — R32 Unspecified urinary incontinence: Secondary | ICD-10-CM | POA: Diagnosis not present

## 2013-10-08 DIAGNOSIS — Z791 Long term (current) use of non-steroidal anti-inflammatories (NSAID): Secondary | ICD-10-CM | POA: Diagnosis not present

## 2013-10-08 DIAGNOSIS — I10 Essential (primary) hypertension: Secondary | ICD-10-CM | POA: Diagnosis not present

## 2013-10-08 DIAGNOSIS — T85693D Other mechanical complication of artificial skin graft and decellularized allodermis, subsequent encounter: Secondary | ICD-10-CM | POA: Diagnosis not present

## 2013-10-09 DIAGNOSIS — E669 Obesity, unspecified: Secondary | ICD-10-CM | POA: Diagnosis not present

## 2013-10-09 DIAGNOSIS — I10 Essential (primary) hypertension: Secondary | ICD-10-CM | POA: Diagnosis not present

## 2013-10-09 DIAGNOSIS — R32 Unspecified urinary incontinence: Secondary | ICD-10-CM | POA: Diagnosis not present

## 2013-10-09 DIAGNOSIS — Z791 Long term (current) use of non-steroidal anti-inflammatories (NSAID): Secondary | ICD-10-CM | POA: Diagnosis not present

## 2013-10-09 DIAGNOSIS — Z792 Long term (current) use of antibiotics: Secondary | ICD-10-CM | POA: Diagnosis not present

## 2013-10-09 DIAGNOSIS — T85693D Other mechanical complication of artificial skin graft and decellularized allodermis, subsequent encounter: Secondary | ICD-10-CM | POA: Diagnosis not present

## 2013-10-10 DIAGNOSIS — Z791 Long term (current) use of non-steroidal anti-inflammatories (NSAID): Secondary | ICD-10-CM | POA: Diagnosis not present

## 2013-10-10 DIAGNOSIS — L97909 Non-pressure chronic ulcer of unspecified part of unspecified lower leg with unspecified severity: Secondary | ICD-10-CM | POA: Diagnosis not present

## 2013-10-10 DIAGNOSIS — Z792 Long term (current) use of antibiotics: Secondary | ICD-10-CM | POA: Diagnosis not present

## 2013-10-10 DIAGNOSIS — I10 Essential (primary) hypertension: Secondary | ICD-10-CM | POA: Diagnosis not present

## 2013-10-10 DIAGNOSIS — T85693D Other mechanical complication of artificial skin graft and decellularized allodermis, subsequent encounter: Secondary | ICD-10-CM | POA: Diagnosis not present

## 2013-10-10 DIAGNOSIS — R32 Unspecified urinary incontinence: Secondary | ICD-10-CM | POA: Diagnosis not present

## 2013-10-10 DIAGNOSIS — E669 Obesity, unspecified: Secondary | ICD-10-CM | POA: Diagnosis not present

## 2013-10-14 DIAGNOSIS — E669 Obesity, unspecified: Secondary | ICD-10-CM | POA: Diagnosis not present

## 2013-10-14 DIAGNOSIS — I10 Essential (primary) hypertension: Secondary | ICD-10-CM | POA: Diagnosis not present

## 2013-10-14 DIAGNOSIS — Z792 Long term (current) use of antibiotics: Secondary | ICD-10-CM | POA: Diagnosis not present

## 2013-10-14 DIAGNOSIS — T85693D Other mechanical complication of artificial skin graft and decellularized allodermis, subsequent encounter: Secondary | ICD-10-CM | POA: Diagnosis not present

## 2013-10-14 DIAGNOSIS — Z791 Long term (current) use of non-steroidal anti-inflammatories (NSAID): Secondary | ICD-10-CM | POA: Diagnosis not present

## 2013-10-14 DIAGNOSIS — R32 Unspecified urinary incontinence: Secondary | ICD-10-CM | POA: Diagnosis not present

## 2013-10-16 DIAGNOSIS — R32 Unspecified urinary incontinence: Secondary | ICD-10-CM | POA: Diagnosis not present

## 2013-10-16 DIAGNOSIS — E669 Obesity, unspecified: Secondary | ICD-10-CM | POA: Diagnosis not present

## 2013-10-16 DIAGNOSIS — Z791 Long term (current) use of non-steroidal anti-inflammatories (NSAID): Secondary | ICD-10-CM | POA: Diagnosis not present

## 2013-10-16 DIAGNOSIS — Z792 Long term (current) use of antibiotics: Secondary | ICD-10-CM | POA: Diagnosis not present

## 2013-10-16 DIAGNOSIS — T85693D Other mechanical complication of artificial skin graft and decellularized allodermis, subsequent encounter: Secondary | ICD-10-CM | POA: Diagnosis not present

## 2013-10-16 DIAGNOSIS — I10 Essential (primary) hypertension: Secondary | ICD-10-CM | POA: Diagnosis not present

## 2013-10-20 DIAGNOSIS — Z791 Long term (current) use of non-steroidal anti-inflammatories (NSAID): Secondary | ICD-10-CM | POA: Diagnosis not present

## 2013-10-20 DIAGNOSIS — R32 Unspecified urinary incontinence: Secondary | ICD-10-CM | POA: Diagnosis not present

## 2013-10-20 DIAGNOSIS — T85693D Other mechanical complication of artificial skin graft and decellularized allodermis, subsequent encounter: Secondary | ICD-10-CM | POA: Diagnosis not present

## 2013-10-20 DIAGNOSIS — Z792 Long term (current) use of antibiotics: Secondary | ICD-10-CM | POA: Diagnosis not present

## 2013-10-20 DIAGNOSIS — I10 Essential (primary) hypertension: Secondary | ICD-10-CM | POA: Diagnosis not present

## 2013-10-20 DIAGNOSIS — E669 Obesity, unspecified: Secondary | ICD-10-CM | POA: Diagnosis not present

## 2013-10-22 DIAGNOSIS — Z792 Long term (current) use of antibiotics: Secondary | ICD-10-CM | POA: Diagnosis not present

## 2013-10-22 DIAGNOSIS — T85693D Other mechanical complication of artificial skin graft and decellularized allodermis, subsequent encounter: Secondary | ICD-10-CM | POA: Diagnosis not present

## 2013-10-22 DIAGNOSIS — R32 Unspecified urinary incontinence: Secondary | ICD-10-CM | POA: Diagnosis not present

## 2013-10-22 DIAGNOSIS — Z791 Long term (current) use of non-steroidal anti-inflammatories (NSAID): Secondary | ICD-10-CM | POA: Diagnosis not present

## 2013-10-22 DIAGNOSIS — I10 Essential (primary) hypertension: Secondary | ICD-10-CM | POA: Diagnosis not present

## 2013-10-22 DIAGNOSIS — E669 Obesity, unspecified: Secondary | ICD-10-CM | POA: Diagnosis not present

## 2013-10-27 DIAGNOSIS — R32 Unspecified urinary incontinence: Secondary | ICD-10-CM | POA: Diagnosis not present

## 2013-10-27 DIAGNOSIS — Z791 Long term (current) use of non-steroidal anti-inflammatories (NSAID): Secondary | ICD-10-CM | POA: Diagnosis not present

## 2013-10-27 DIAGNOSIS — I10 Essential (primary) hypertension: Secondary | ICD-10-CM | POA: Diagnosis not present

## 2013-10-27 DIAGNOSIS — T85693D Other mechanical complication of artificial skin graft and decellularized allodermis, subsequent encounter: Secondary | ICD-10-CM | POA: Diagnosis not present

## 2013-10-27 DIAGNOSIS — Z792 Long term (current) use of antibiotics: Secondary | ICD-10-CM | POA: Diagnosis not present

## 2013-10-27 DIAGNOSIS — E669 Obesity, unspecified: Secondary | ICD-10-CM | POA: Diagnosis not present

## 2013-10-30 DIAGNOSIS — Z791 Long term (current) use of non-steroidal anti-inflammatories (NSAID): Secondary | ICD-10-CM | POA: Diagnosis not present

## 2013-10-30 DIAGNOSIS — R32 Unspecified urinary incontinence: Secondary | ICD-10-CM | POA: Diagnosis not present

## 2013-10-30 DIAGNOSIS — Z23 Encounter for immunization: Secondary | ICD-10-CM | POA: Diagnosis not present

## 2013-10-30 DIAGNOSIS — I10 Essential (primary) hypertension: Secondary | ICD-10-CM | POA: Diagnosis not present

## 2013-10-30 DIAGNOSIS — T85693D Other mechanical complication of artificial skin graft and decellularized allodermis, subsequent encounter: Secondary | ICD-10-CM | POA: Diagnosis not present

## 2013-10-30 DIAGNOSIS — E669 Obesity, unspecified: Secondary | ICD-10-CM | POA: Diagnosis not present

## 2013-10-30 DIAGNOSIS — Z792 Long term (current) use of antibiotics: Secondary | ICD-10-CM | POA: Diagnosis not present

## 2013-11-03 DIAGNOSIS — I10 Essential (primary) hypertension: Secondary | ICD-10-CM | POA: Diagnosis not present

## 2013-11-03 DIAGNOSIS — Z792 Long term (current) use of antibiotics: Secondary | ICD-10-CM | POA: Diagnosis not present

## 2013-11-03 DIAGNOSIS — E669 Obesity, unspecified: Secondary | ICD-10-CM | POA: Diagnosis not present

## 2013-11-03 DIAGNOSIS — Z791 Long term (current) use of non-steroidal anti-inflammatories (NSAID): Secondary | ICD-10-CM | POA: Diagnosis not present

## 2013-11-03 DIAGNOSIS — R32 Unspecified urinary incontinence: Secondary | ICD-10-CM | POA: Diagnosis not present

## 2013-11-03 DIAGNOSIS — T85693D Other mechanical complication of artificial skin graft and decellularized allodermis, subsequent encounter: Secondary | ICD-10-CM | POA: Diagnosis not present

## 2013-11-10 DIAGNOSIS — T85693D Other mechanical complication of artificial skin graft and decellularized allodermis, subsequent encounter: Secondary | ICD-10-CM | POA: Diagnosis not present

## 2013-11-10 DIAGNOSIS — Z791 Long term (current) use of non-steroidal anti-inflammatories (NSAID): Secondary | ICD-10-CM | POA: Diagnosis not present

## 2013-11-10 DIAGNOSIS — Z792 Long term (current) use of antibiotics: Secondary | ICD-10-CM | POA: Diagnosis not present

## 2013-11-10 DIAGNOSIS — R32 Unspecified urinary incontinence: Secondary | ICD-10-CM | POA: Diagnosis not present

## 2013-11-10 DIAGNOSIS — E669 Obesity, unspecified: Secondary | ICD-10-CM | POA: Diagnosis not present

## 2013-11-10 DIAGNOSIS — I10 Essential (primary) hypertension: Secondary | ICD-10-CM | POA: Diagnosis not present

## 2013-11-18 DIAGNOSIS — T85693D Other mechanical complication of artificial skin graft and decellularized allodermis, subsequent encounter: Secondary | ICD-10-CM | POA: Diagnosis not present

## 2013-11-18 DIAGNOSIS — R32 Unspecified urinary incontinence: Secondary | ICD-10-CM | POA: Diagnosis not present

## 2013-11-18 DIAGNOSIS — Z791 Long term (current) use of non-steroidal anti-inflammatories (NSAID): Secondary | ICD-10-CM | POA: Diagnosis not present

## 2013-11-18 DIAGNOSIS — I10 Essential (primary) hypertension: Secondary | ICD-10-CM | POA: Diagnosis not present

## 2013-11-18 DIAGNOSIS — Z792 Long term (current) use of antibiotics: Secondary | ICD-10-CM | POA: Diagnosis not present

## 2013-11-18 DIAGNOSIS — E669 Obesity, unspecified: Secondary | ICD-10-CM | POA: Diagnosis not present

## 2013-11-26 DIAGNOSIS — L97321 Non-pressure chronic ulcer of left ankle limited to breakdown of skin: Secondary | ICD-10-CM | POA: Diagnosis not present

## 2013-11-26 DIAGNOSIS — I872 Venous insufficiency (chronic) (peripheral): Secondary | ICD-10-CM | POA: Diagnosis not present

## 2013-11-26 DIAGNOSIS — M2041 Other hammer toe(s) (acquired), right foot: Secondary | ICD-10-CM | POA: Diagnosis not present

## 2013-11-26 DIAGNOSIS — L723 Sebaceous cyst: Secondary | ICD-10-CM | POA: Diagnosis not present

## 2013-11-26 DIAGNOSIS — I83012 Varicose veins of right lower extremity with ulcer of calf: Secondary | ICD-10-CM | POA: Diagnosis not present

## 2013-11-26 DIAGNOSIS — M2010 Hallux valgus (acquired), unspecified foot: Secondary | ICD-10-CM | POA: Diagnosis not present

## 2013-11-26 DIAGNOSIS — M2040 Other hammer toe(s) (acquired), unspecified foot: Secondary | ICD-10-CM | POA: Diagnosis not present

## 2013-11-26 DIAGNOSIS — L97211 Non-pressure chronic ulcer of right calf limited to breakdown of skin: Secondary | ICD-10-CM | POA: Diagnosis not present

## 2013-11-26 DIAGNOSIS — L602 Onychogryphosis: Secondary | ICD-10-CM | POA: Diagnosis not present

## 2013-11-26 DIAGNOSIS — L97219 Non-pressure chronic ulcer of right calf with unspecified severity: Secondary | ICD-10-CM | POA: Diagnosis not present

## 2013-11-26 DIAGNOSIS — I83023 Varicose veins of left lower extremity with ulcer of ankle: Secondary | ICD-10-CM | POA: Diagnosis not present

## 2013-11-26 DIAGNOSIS — L600 Ingrowing nail: Secondary | ICD-10-CM | POA: Diagnosis not present

## 2013-11-26 DIAGNOSIS — L97329 Non-pressure chronic ulcer of left ankle with unspecified severity: Secondary | ICD-10-CM | POA: Diagnosis not present

## 2013-12-03 DIAGNOSIS — I83023 Varicose veins of left lower extremity with ulcer of ankle: Secondary | ICD-10-CM | POA: Diagnosis not present

## 2013-12-03 DIAGNOSIS — L97219 Non-pressure chronic ulcer of right calf with unspecified severity: Secondary | ICD-10-CM | POA: Diagnosis not present

## 2013-12-03 DIAGNOSIS — L97329 Non-pressure chronic ulcer of left ankle with unspecified severity: Secondary | ICD-10-CM | POA: Diagnosis not present

## 2013-12-03 DIAGNOSIS — L97211 Non-pressure chronic ulcer of right calf limited to breakdown of skin: Secondary | ICD-10-CM | POA: Diagnosis not present

## 2013-12-03 DIAGNOSIS — L97321 Non-pressure chronic ulcer of left ankle limited to breakdown of skin: Secondary | ICD-10-CM | POA: Diagnosis not present

## 2013-12-03 DIAGNOSIS — I83012 Varicose veins of right lower extremity with ulcer of calf: Secondary | ICD-10-CM | POA: Diagnosis not present

## 2013-12-03 DIAGNOSIS — I872 Venous insufficiency (chronic) (peripheral): Secondary | ICD-10-CM | POA: Diagnosis not present

## 2013-12-16 DIAGNOSIS — L97321 Non-pressure chronic ulcer of left ankle limited to breakdown of skin: Secondary | ICD-10-CM | POA: Diagnosis not present

## 2013-12-16 DIAGNOSIS — I83012 Varicose veins of right lower extremity with ulcer of calf: Secondary | ICD-10-CM | POA: Diagnosis not present

## 2013-12-16 DIAGNOSIS — L97211 Non-pressure chronic ulcer of right calf limited to breakdown of skin: Secondary | ICD-10-CM | POA: Diagnosis not present

## 2013-12-16 DIAGNOSIS — L97329 Non-pressure chronic ulcer of left ankle with unspecified severity: Secondary | ICD-10-CM | POA: Diagnosis not present

## 2013-12-16 DIAGNOSIS — I872 Venous insufficiency (chronic) (peripheral): Secondary | ICD-10-CM | POA: Diagnosis not present

## 2013-12-16 DIAGNOSIS — L97219 Non-pressure chronic ulcer of right calf with unspecified severity: Secondary | ICD-10-CM | POA: Diagnosis not present

## 2013-12-16 DIAGNOSIS — I83023 Varicose veins of left lower extremity with ulcer of ankle: Secondary | ICD-10-CM | POA: Diagnosis not present

## 2013-12-23 DIAGNOSIS — L97321 Non-pressure chronic ulcer of left ankle limited to breakdown of skin: Secondary | ICD-10-CM | POA: Diagnosis not present

## 2013-12-23 DIAGNOSIS — L97329 Non-pressure chronic ulcer of left ankle with unspecified severity: Secondary | ICD-10-CM | POA: Diagnosis not present

## 2013-12-23 DIAGNOSIS — I83013 Varicose veins of right lower extremity with ulcer of ankle: Secondary | ICD-10-CM | POA: Diagnosis not present

## 2013-12-23 DIAGNOSIS — L97319 Non-pressure chronic ulcer of right ankle with unspecified severity: Secondary | ICD-10-CM | POA: Diagnosis not present

## 2013-12-23 DIAGNOSIS — I872 Venous insufficiency (chronic) (peripheral): Secondary | ICD-10-CM | POA: Diagnosis not present

## 2013-12-23 DIAGNOSIS — L97311 Non-pressure chronic ulcer of right ankle limited to breakdown of skin: Secondary | ICD-10-CM | POA: Diagnosis not present

## 2013-12-23 DIAGNOSIS — L97819 Non-pressure chronic ulcer of other part of right lower leg with unspecified severity: Secondary | ICD-10-CM | POA: Diagnosis not present

## 2013-12-23 DIAGNOSIS — I83023 Varicose veins of left lower extremity with ulcer of ankle: Secondary | ICD-10-CM | POA: Diagnosis not present

## 2013-12-30 DIAGNOSIS — I83013 Varicose veins of right lower extremity with ulcer of ankle: Secondary | ICD-10-CM | POA: Diagnosis not present

## 2013-12-30 DIAGNOSIS — L97819 Non-pressure chronic ulcer of other part of right lower leg with unspecified severity: Secondary | ICD-10-CM | POA: Diagnosis not present

## 2013-12-30 DIAGNOSIS — L97311 Non-pressure chronic ulcer of right ankle limited to breakdown of skin: Secondary | ICD-10-CM | POA: Diagnosis not present

## 2013-12-30 DIAGNOSIS — I872 Venous insufficiency (chronic) (peripheral): Secondary | ICD-10-CM | POA: Diagnosis not present

## 2013-12-30 DIAGNOSIS — L97329 Non-pressure chronic ulcer of left ankle with unspecified severity: Secondary | ICD-10-CM | POA: Diagnosis not present

## 2013-12-30 DIAGNOSIS — L97321 Non-pressure chronic ulcer of left ankle limited to breakdown of skin: Secondary | ICD-10-CM | POA: Diagnosis not present

## 2013-12-30 DIAGNOSIS — I83023 Varicose veins of left lower extremity with ulcer of ankle: Secondary | ICD-10-CM | POA: Diagnosis not present

## 2013-12-30 DIAGNOSIS — L97319 Non-pressure chronic ulcer of right ankle with unspecified severity: Secondary | ICD-10-CM | POA: Diagnosis not present

## 2014-01-06 DIAGNOSIS — I83013 Varicose veins of right lower extremity with ulcer of ankle: Secondary | ICD-10-CM | POA: Diagnosis not present

## 2014-01-06 DIAGNOSIS — I872 Venous insufficiency (chronic) (peripheral): Secondary | ICD-10-CM | POA: Diagnosis not present

## 2014-01-06 DIAGNOSIS — L97311 Non-pressure chronic ulcer of right ankle limited to breakdown of skin: Secondary | ICD-10-CM | POA: Diagnosis not present

## 2014-01-06 DIAGNOSIS — I83023 Varicose veins of left lower extremity with ulcer of ankle: Secondary | ICD-10-CM | POA: Diagnosis not present

## 2014-01-06 DIAGNOSIS — L97321 Non-pressure chronic ulcer of left ankle limited to breakdown of skin: Secondary | ICD-10-CM | POA: Diagnosis not present

## 2014-01-06 DIAGNOSIS — L97329 Non-pressure chronic ulcer of left ankle with unspecified severity: Secondary | ICD-10-CM | POA: Diagnosis not present

## 2014-01-06 DIAGNOSIS — L97819 Non-pressure chronic ulcer of other part of right lower leg with unspecified severity: Secondary | ICD-10-CM | POA: Diagnosis not present

## 2014-01-06 DIAGNOSIS — L97319 Non-pressure chronic ulcer of right ankle with unspecified severity: Secondary | ICD-10-CM | POA: Diagnosis not present

## 2014-01-15 DIAGNOSIS — I10 Essential (primary) hypertension: Secondary | ICD-10-CM | POA: Diagnosis not present

## 2014-01-20 DIAGNOSIS — M79674 Pain in right toe(s): Secondary | ICD-10-CM | POA: Diagnosis not present

## 2014-01-20 DIAGNOSIS — M79675 Pain in left toe(s): Secondary | ICD-10-CM | POA: Diagnosis not present

## 2014-01-20 DIAGNOSIS — M2041 Other hammer toe(s) (acquired), right foot: Secondary | ICD-10-CM | POA: Diagnosis not present

## 2014-01-20 DIAGNOSIS — M2042 Other hammer toe(s) (acquired), left foot: Secondary | ICD-10-CM | POA: Diagnosis not present

## 2014-01-20 DIAGNOSIS — M79671 Pain in right foot: Secondary | ICD-10-CM | POA: Diagnosis not present

## 2014-01-20 DIAGNOSIS — M79672 Pain in left foot: Secondary | ICD-10-CM | POA: Diagnosis not present

## 2014-01-20 DIAGNOSIS — B351 Tinea unguium: Secondary | ICD-10-CM | POA: Diagnosis not present

## 2014-01-20 DIAGNOSIS — L600 Ingrowing nail: Secondary | ICD-10-CM | POA: Diagnosis not present

## 2014-02-09 DIAGNOSIS — Z961 Presence of intraocular lens: Secondary | ICD-10-CM | POA: Diagnosis not present

## 2014-02-09 DIAGNOSIS — H2512 Age-related nuclear cataract, left eye: Secondary | ICD-10-CM | POA: Diagnosis not present

## 2014-02-18 DIAGNOSIS — L97329 Non-pressure chronic ulcer of left ankle with unspecified severity: Secondary | ICD-10-CM | POA: Diagnosis not present

## 2014-02-18 DIAGNOSIS — I83023 Varicose veins of left lower extremity with ulcer of ankle: Secondary | ICD-10-CM | POA: Diagnosis not present

## 2014-02-18 DIAGNOSIS — L97321 Non-pressure chronic ulcer of left ankle limited to breakdown of skin: Secondary | ICD-10-CM | POA: Diagnosis not present

## 2014-03-04 DIAGNOSIS — M2041 Other hammer toe(s) (acquired), right foot: Secondary | ICD-10-CM | POA: Diagnosis not present

## 2014-03-04 DIAGNOSIS — M2042 Other hammer toe(s) (acquired), left foot: Secondary | ICD-10-CM | POA: Diagnosis not present

## 2014-03-04 DIAGNOSIS — M79675 Pain in left toe(s): Secondary | ICD-10-CM | POA: Diagnosis not present

## 2014-03-04 DIAGNOSIS — M79674 Pain in right toe(s): Secondary | ICD-10-CM | POA: Diagnosis not present

## 2014-03-04 DIAGNOSIS — L723 Sebaceous cyst: Secondary | ICD-10-CM | POA: Diagnosis not present

## 2014-03-04 DIAGNOSIS — M79672 Pain in left foot: Secondary | ICD-10-CM | POA: Diagnosis not present

## 2014-03-04 DIAGNOSIS — B351 Tinea unguium: Secondary | ICD-10-CM | POA: Diagnosis not present

## 2014-03-04 DIAGNOSIS — L602 Onychogryphosis: Secondary | ICD-10-CM | POA: Diagnosis not present

## 2014-03-04 DIAGNOSIS — L600 Ingrowing nail: Secondary | ICD-10-CM | POA: Diagnosis not present

## 2014-03-18 DIAGNOSIS — L97321 Non-pressure chronic ulcer of left ankle limited to breakdown of skin: Secondary | ICD-10-CM | POA: Diagnosis not present

## 2014-03-18 DIAGNOSIS — I83023 Varicose veins of left lower extremity with ulcer of ankle: Secondary | ICD-10-CM | POA: Diagnosis not present

## 2014-03-18 DIAGNOSIS — I872 Venous insufficiency (chronic) (peripheral): Secondary | ICD-10-CM | POA: Diagnosis not present

## 2014-04-01 DIAGNOSIS — L97321 Non-pressure chronic ulcer of left ankle limited to breakdown of skin: Secondary | ICD-10-CM | POA: Diagnosis not present

## 2014-04-01 DIAGNOSIS — I83023 Varicose veins of left lower extremity with ulcer of ankle: Secondary | ICD-10-CM | POA: Diagnosis not present

## 2014-04-01 DIAGNOSIS — L97329 Non-pressure chronic ulcer of left ankle with unspecified severity: Secondary | ICD-10-CM | POA: Diagnosis not present

## 2014-04-08 DIAGNOSIS — L97909 Non-pressure chronic ulcer of unspecified part of unspecified lower leg with unspecified severity: Secondary | ICD-10-CM | POA: Diagnosis not present

## 2014-04-20 DIAGNOSIS — I10 Essential (primary) hypertension: Secondary | ICD-10-CM | POA: Diagnosis not present

## 2014-04-20 DIAGNOSIS — M160 Bilateral primary osteoarthritis of hip: Secondary | ICD-10-CM | POA: Diagnosis not present

## 2014-04-20 DIAGNOSIS — Z131 Encounter for screening for diabetes mellitus: Secondary | ICD-10-CM | POA: Diagnosis not present

## 2014-04-20 DIAGNOSIS — Z1389 Encounter for screening for other disorder: Secondary | ICD-10-CM | POA: Diagnosis not present

## 2014-04-20 DIAGNOSIS — Z0000 Encounter for general adult medical examination without abnormal findings: Secondary | ICD-10-CM | POA: Diagnosis not present

## 2014-05-05 DIAGNOSIS — Z96643 Presence of artificial hip joint, bilateral: Secondary | ICD-10-CM | POA: Diagnosis not present

## 2014-05-05 DIAGNOSIS — Z801 Family history of malignant neoplasm of trachea, bronchus and lung: Secondary | ICD-10-CM | POA: Diagnosis not present

## 2014-05-05 DIAGNOSIS — M1990 Unspecified osteoarthritis, unspecified site: Secondary | ICD-10-CM | POA: Diagnosis not present

## 2014-05-05 DIAGNOSIS — Z79899 Other long term (current) drug therapy: Secondary | ICD-10-CM | POA: Diagnosis not present

## 2014-05-05 DIAGNOSIS — R279 Unspecified lack of coordination: Secondary | ICD-10-CM | POA: Diagnosis not present

## 2014-05-05 DIAGNOSIS — Z89419 Acquired absence of unspecified great toe: Secondary | ICD-10-CM | POA: Diagnosis not present

## 2014-05-05 DIAGNOSIS — Z800 Family history of malignant neoplasm of digestive organs: Secondary | ICD-10-CM | POA: Diagnosis not present

## 2014-05-05 DIAGNOSIS — I872 Venous insufficiency (chronic) (peripheral): Secondary | ICD-10-CM | POA: Diagnosis present

## 2014-05-05 DIAGNOSIS — Z833 Family history of diabetes mellitus: Secondary | ICD-10-CM | POA: Diagnosis not present

## 2014-05-05 DIAGNOSIS — Z880 Allergy status to penicillin: Secondary | ICD-10-CM | POA: Diagnosis not present

## 2014-05-05 DIAGNOSIS — Z7401 Bed confinement status: Secondary | ICD-10-CM | POA: Diagnosis not present

## 2014-05-05 DIAGNOSIS — Z886 Allergy status to analgesic agent status: Secondary | ICD-10-CM | POA: Diagnosis not present

## 2014-05-05 DIAGNOSIS — I10 Essential (primary) hypertension: Secondary | ICD-10-CM | POA: Diagnosis not present

## 2014-05-05 DIAGNOSIS — L97329 Non-pressure chronic ulcer of left ankle with unspecified severity: Secondary | ICD-10-CM | POA: Diagnosis not present

## 2014-05-05 DIAGNOSIS — M169 Osteoarthritis of hip, unspecified: Secondary | ICD-10-CM | POA: Diagnosis not present

## 2014-05-05 DIAGNOSIS — Z8739 Personal history of other diseases of the musculoskeletal system and connective tissue: Secondary | ICD-10-CM | POA: Diagnosis not present

## 2014-05-05 DIAGNOSIS — L97929 Non-pressure chronic ulcer of unspecified part of left lower leg with unspecified severity: Secondary | ICD-10-CM | POA: Diagnosis not present

## 2014-05-05 DIAGNOSIS — T85693A Other mechanical complication of artificial skin graft and decellularized allodermis, initial encounter: Secondary | ICD-10-CM | POA: Diagnosis not present

## 2014-05-05 DIAGNOSIS — Z6836 Body mass index (BMI) 36.0-36.9, adult: Secondary | ICD-10-CM | POA: Diagnosis not present

## 2014-05-08 DIAGNOSIS — Z89419 Acquired absence of unspecified great toe: Secondary | ICD-10-CM | POA: Diagnosis not present

## 2014-05-08 DIAGNOSIS — Z8739 Personal history of other diseases of the musculoskeletal system and connective tissue: Secondary | ICD-10-CM | POA: Diagnosis not present

## 2014-05-08 DIAGNOSIS — Z96643 Presence of artificial hip joint, bilateral: Secondary | ICD-10-CM | POA: Diagnosis not present

## 2014-05-08 DIAGNOSIS — L97929 Non-pressure chronic ulcer of unspecified part of left lower leg with unspecified severity: Secondary | ICD-10-CM | POA: Diagnosis not present

## 2014-05-08 DIAGNOSIS — I10 Essential (primary) hypertension: Secondary | ICD-10-CM | POA: Diagnosis not present

## 2014-05-08 DIAGNOSIS — M169 Osteoarthritis of hip, unspecified: Secondary | ICD-10-CM | POA: Diagnosis not present

## 2014-05-08 DIAGNOSIS — Z7401 Bed confinement status: Secondary | ICD-10-CM | POA: Diagnosis not present

## 2014-05-08 DIAGNOSIS — T85693A Other mechanical complication of artificial skin graft and decellularized allodermis, initial encounter: Secondary | ICD-10-CM | POA: Diagnosis not present

## 2014-05-08 DIAGNOSIS — R279 Unspecified lack of coordination: Secondary | ICD-10-CM | POA: Diagnosis not present

## 2014-06-16 DIAGNOSIS — L03115 Cellulitis of right lower limb: Secondary | ICD-10-CM | POA: Diagnosis not present

## 2014-06-16 DIAGNOSIS — R42 Dizziness and giddiness: Secondary | ICD-10-CM | POA: Diagnosis not present

## 2014-06-16 DIAGNOSIS — Z792 Long term (current) use of antibiotics: Secondary | ICD-10-CM | POA: Diagnosis not present

## 2014-06-16 DIAGNOSIS — Z880 Allergy status to penicillin: Secondary | ICD-10-CM | POA: Diagnosis not present

## 2014-06-16 DIAGNOSIS — Z452 Encounter for adjustment and management of vascular access device: Secondary | ICD-10-CM | POA: Diagnosis not present

## 2014-06-16 DIAGNOSIS — L97119 Non-pressure chronic ulcer of right thigh with unspecified severity: Secondary | ICD-10-CM | POA: Diagnosis not present

## 2014-06-16 DIAGNOSIS — I83011 Varicose veins of right lower extremity with ulcer of thigh: Secondary | ICD-10-CM | POA: Diagnosis not present

## 2014-06-16 DIAGNOSIS — L97909 Non-pressure chronic ulcer of unspecified part of unspecified lower leg with unspecified severity: Secondary | ICD-10-CM | POA: Diagnosis not present

## 2014-06-16 DIAGNOSIS — I10 Essential (primary) hypertension: Secondary | ICD-10-CM | POA: Diagnosis not present

## 2014-06-16 DIAGNOSIS — Z79891 Long term (current) use of opiate analgesic: Secondary | ICD-10-CM | POA: Diagnosis not present

## 2014-06-16 DIAGNOSIS — L97929 Non-pressure chronic ulcer of unspecified part of left lower leg with unspecified severity: Secondary | ICD-10-CM | POA: Diagnosis not present

## 2014-06-16 DIAGNOSIS — Z886 Allergy status to analgesic agent status: Secondary | ICD-10-CM | POA: Diagnosis not present

## 2014-06-16 DIAGNOSIS — T85693D Other mechanical complication of artificial skin graft and decellularized allodermis, subsequent encounter: Secondary | ICD-10-CM | POA: Diagnosis not present

## 2014-06-16 DIAGNOSIS — R55 Syncope and collapse: Secondary | ICD-10-CM | POA: Diagnosis not present

## 2014-06-17 DIAGNOSIS — L97909 Non-pressure chronic ulcer of unspecified part of unspecified lower leg with unspecified severity: Secondary | ICD-10-CM | POA: Diagnosis not present

## 2014-06-17 DIAGNOSIS — L03115 Cellulitis of right lower limb: Secondary | ICD-10-CM | POA: Diagnosis not present

## 2014-06-17 DIAGNOSIS — R55 Syncope and collapse: Secondary | ICD-10-CM | POA: Diagnosis not present

## 2014-06-17 DIAGNOSIS — I10 Essential (primary) hypertension: Secondary | ICD-10-CM | POA: Diagnosis not present

## 2014-06-18 DIAGNOSIS — Z792 Long term (current) use of antibiotics: Secondary | ICD-10-CM | POA: Diagnosis not present

## 2014-06-18 DIAGNOSIS — I10 Essential (primary) hypertension: Secondary | ICD-10-CM | POA: Diagnosis not present

## 2014-06-18 DIAGNOSIS — T85693D Other mechanical complication of artificial skin graft and decellularized allodermis, subsequent encounter: Secondary | ICD-10-CM | POA: Diagnosis not present

## 2014-06-18 DIAGNOSIS — L97929 Non-pressure chronic ulcer of unspecified part of left lower leg with unspecified severity: Secondary | ICD-10-CM | POA: Diagnosis not present

## 2014-06-18 DIAGNOSIS — Z452 Encounter for adjustment and management of vascular access device: Secondary | ICD-10-CM | POA: Diagnosis not present

## 2014-06-19 DIAGNOSIS — I10 Essential (primary) hypertension: Secondary | ICD-10-CM | POA: Diagnosis not present

## 2014-06-19 DIAGNOSIS — L97929 Non-pressure chronic ulcer of unspecified part of left lower leg with unspecified severity: Secondary | ICD-10-CM | POA: Diagnosis not present

## 2014-06-19 DIAGNOSIS — Z792 Long term (current) use of antibiotics: Secondary | ICD-10-CM | POA: Diagnosis not present

## 2014-06-19 DIAGNOSIS — T85693D Other mechanical complication of artificial skin graft and decellularized allodermis, subsequent encounter: Secondary | ICD-10-CM | POA: Diagnosis not present

## 2014-06-19 DIAGNOSIS — Z452 Encounter for adjustment and management of vascular access device: Secondary | ICD-10-CM | POA: Diagnosis not present

## 2014-06-20 DIAGNOSIS — I10 Essential (primary) hypertension: Secondary | ICD-10-CM | POA: Diagnosis not present

## 2014-06-20 DIAGNOSIS — T85693D Other mechanical complication of artificial skin graft and decellularized allodermis, subsequent encounter: Secondary | ICD-10-CM | POA: Diagnosis not present

## 2014-06-20 DIAGNOSIS — Z792 Long term (current) use of antibiotics: Secondary | ICD-10-CM | POA: Diagnosis not present

## 2014-06-20 DIAGNOSIS — L97929 Non-pressure chronic ulcer of unspecified part of left lower leg with unspecified severity: Secondary | ICD-10-CM | POA: Diagnosis not present

## 2014-06-20 DIAGNOSIS — Z452 Encounter for adjustment and management of vascular access device: Secondary | ICD-10-CM | POA: Diagnosis not present

## 2014-06-21 DIAGNOSIS — Z452 Encounter for adjustment and management of vascular access device: Secondary | ICD-10-CM | POA: Diagnosis not present

## 2014-06-21 DIAGNOSIS — Z792 Long term (current) use of antibiotics: Secondary | ICD-10-CM | POA: Diagnosis not present

## 2014-06-21 DIAGNOSIS — T85693D Other mechanical complication of artificial skin graft and decellularized allodermis, subsequent encounter: Secondary | ICD-10-CM | POA: Diagnosis not present

## 2014-06-21 DIAGNOSIS — I10 Essential (primary) hypertension: Secondary | ICD-10-CM | POA: Diagnosis not present

## 2014-06-21 DIAGNOSIS — L97929 Non-pressure chronic ulcer of unspecified part of left lower leg with unspecified severity: Secondary | ICD-10-CM | POA: Diagnosis not present

## 2014-06-22 DIAGNOSIS — Z792 Long term (current) use of antibiotics: Secondary | ICD-10-CM | POA: Diagnosis not present

## 2014-06-22 DIAGNOSIS — L97929 Non-pressure chronic ulcer of unspecified part of left lower leg with unspecified severity: Secondary | ICD-10-CM | POA: Diagnosis not present

## 2014-06-22 DIAGNOSIS — T85693D Other mechanical complication of artificial skin graft and decellularized allodermis, subsequent encounter: Secondary | ICD-10-CM | POA: Diagnosis not present

## 2014-06-22 DIAGNOSIS — I10 Essential (primary) hypertension: Secondary | ICD-10-CM | POA: Diagnosis not present

## 2014-06-22 DIAGNOSIS — Z452 Encounter for adjustment and management of vascular access device: Secondary | ICD-10-CM | POA: Diagnosis not present

## 2014-06-23 DIAGNOSIS — L97929 Non-pressure chronic ulcer of unspecified part of left lower leg with unspecified severity: Secondary | ICD-10-CM | POA: Diagnosis not present

## 2014-06-23 DIAGNOSIS — Z452 Encounter for adjustment and management of vascular access device: Secondary | ICD-10-CM | POA: Diagnosis not present

## 2014-06-23 DIAGNOSIS — I10 Essential (primary) hypertension: Secondary | ICD-10-CM | POA: Diagnosis not present

## 2014-06-23 DIAGNOSIS — Z792 Long term (current) use of antibiotics: Secondary | ICD-10-CM | POA: Diagnosis not present

## 2014-06-23 DIAGNOSIS — T85693D Other mechanical complication of artificial skin graft and decellularized allodermis, subsequent encounter: Secondary | ICD-10-CM | POA: Diagnosis not present

## 2014-06-24 DIAGNOSIS — L97929 Non-pressure chronic ulcer of unspecified part of left lower leg with unspecified severity: Secondary | ICD-10-CM | POA: Diagnosis not present

## 2014-06-24 DIAGNOSIS — T85693D Other mechanical complication of artificial skin graft and decellularized allodermis, subsequent encounter: Secondary | ICD-10-CM | POA: Diagnosis not present

## 2014-06-24 DIAGNOSIS — I10 Essential (primary) hypertension: Secondary | ICD-10-CM | POA: Diagnosis not present

## 2014-06-24 DIAGNOSIS — Z452 Encounter for adjustment and management of vascular access device: Secondary | ICD-10-CM | POA: Diagnosis not present

## 2014-06-24 DIAGNOSIS — Z792 Long term (current) use of antibiotics: Secondary | ICD-10-CM | POA: Diagnosis not present

## 2014-06-25 DIAGNOSIS — B351 Tinea unguium: Secondary | ICD-10-CM | POA: Diagnosis not present

## 2014-06-25 DIAGNOSIS — M7741 Metatarsalgia, right foot: Secondary | ICD-10-CM | POA: Diagnosis not present

## 2014-06-25 DIAGNOSIS — M79674 Pain in right toe(s): Secondary | ICD-10-CM | POA: Diagnosis not present

## 2014-06-25 DIAGNOSIS — M79671 Pain in right foot: Secondary | ICD-10-CM | POA: Diagnosis not present

## 2014-06-25 DIAGNOSIS — M7742 Metatarsalgia, left foot: Secondary | ICD-10-CM | POA: Diagnosis not present

## 2014-06-25 DIAGNOSIS — L602 Onychogryphosis: Secondary | ICD-10-CM | POA: Diagnosis not present

## 2014-06-25 DIAGNOSIS — M79672 Pain in left foot: Secondary | ICD-10-CM | POA: Diagnosis not present

## 2014-06-25 DIAGNOSIS — M79675 Pain in left toe(s): Secondary | ICD-10-CM | POA: Diagnosis not present

## 2014-06-25 DIAGNOSIS — L723 Sebaceous cyst: Secondary | ICD-10-CM | POA: Diagnosis not present

## 2014-06-26 DIAGNOSIS — L03115 Cellulitis of right lower limb: Secondary | ICD-10-CM | POA: Diagnosis not present

## 2014-06-29 DIAGNOSIS — T85693D Other mechanical complication of artificial skin graft and decellularized allodermis, subsequent encounter: Secondary | ICD-10-CM | POA: Diagnosis not present

## 2014-06-29 DIAGNOSIS — L97929 Non-pressure chronic ulcer of unspecified part of left lower leg with unspecified severity: Secondary | ICD-10-CM | POA: Diagnosis not present

## 2014-06-29 DIAGNOSIS — I10 Essential (primary) hypertension: Secondary | ICD-10-CM | POA: Diagnosis not present

## 2014-06-29 DIAGNOSIS — Z452 Encounter for adjustment and management of vascular access device: Secondary | ICD-10-CM | POA: Diagnosis not present

## 2014-06-29 DIAGNOSIS — Z792 Long term (current) use of antibiotics: Secondary | ICD-10-CM | POA: Diagnosis not present

## 2014-06-30 DIAGNOSIS — Z792 Long term (current) use of antibiotics: Secondary | ICD-10-CM | POA: Diagnosis not present

## 2014-06-30 DIAGNOSIS — Z452 Encounter for adjustment and management of vascular access device: Secondary | ICD-10-CM | POA: Diagnosis not present

## 2014-06-30 DIAGNOSIS — L97929 Non-pressure chronic ulcer of unspecified part of left lower leg with unspecified severity: Secondary | ICD-10-CM | POA: Diagnosis not present

## 2014-06-30 DIAGNOSIS — T85693D Other mechanical complication of artificial skin graft and decellularized allodermis, subsequent encounter: Secondary | ICD-10-CM | POA: Diagnosis not present

## 2014-06-30 DIAGNOSIS — I10 Essential (primary) hypertension: Secondary | ICD-10-CM | POA: Diagnosis not present

## 2014-07-02 DIAGNOSIS — I10 Essential (primary) hypertension: Secondary | ICD-10-CM | POA: Diagnosis not present

## 2014-07-02 DIAGNOSIS — T85693D Other mechanical complication of artificial skin graft and decellularized allodermis, subsequent encounter: Secondary | ICD-10-CM | POA: Diagnosis not present

## 2014-07-02 DIAGNOSIS — L97929 Non-pressure chronic ulcer of unspecified part of left lower leg with unspecified severity: Secondary | ICD-10-CM | POA: Diagnosis not present

## 2014-07-02 DIAGNOSIS — Z452 Encounter for adjustment and management of vascular access device: Secondary | ICD-10-CM | POA: Diagnosis not present

## 2014-07-02 DIAGNOSIS — Z792 Long term (current) use of antibiotics: Secondary | ICD-10-CM | POA: Diagnosis not present

## 2014-07-03 DIAGNOSIS — T85693D Other mechanical complication of artificial skin graft and decellularized allodermis, subsequent encounter: Secondary | ICD-10-CM | POA: Diagnosis not present

## 2014-07-03 DIAGNOSIS — Z452 Encounter for adjustment and management of vascular access device: Secondary | ICD-10-CM | POA: Diagnosis not present

## 2014-07-03 DIAGNOSIS — I10 Essential (primary) hypertension: Secondary | ICD-10-CM | POA: Diagnosis not present

## 2014-07-03 DIAGNOSIS — L97929 Non-pressure chronic ulcer of unspecified part of left lower leg with unspecified severity: Secondary | ICD-10-CM | POA: Diagnosis not present

## 2014-07-03 DIAGNOSIS — Z792 Long term (current) use of antibiotics: Secondary | ICD-10-CM | POA: Diagnosis not present

## 2014-07-07 DIAGNOSIS — L97929 Non-pressure chronic ulcer of unspecified part of left lower leg with unspecified severity: Secondary | ICD-10-CM | POA: Diagnosis not present

## 2014-07-07 DIAGNOSIS — T85693D Other mechanical complication of artificial skin graft and decellularized allodermis, subsequent encounter: Secondary | ICD-10-CM | POA: Diagnosis not present

## 2014-07-07 DIAGNOSIS — Z792 Long term (current) use of antibiotics: Secondary | ICD-10-CM | POA: Diagnosis not present

## 2014-07-07 DIAGNOSIS — I10 Essential (primary) hypertension: Secondary | ICD-10-CM | POA: Diagnosis not present

## 2014-07-07 DIAGNOSIS — Z452 Encounter for adjustment and management of vascular access device: Secondary | ICD-10-CM | POA: Diagnosis not present

## 2014-07-09 DIAGNOSIS — I10 Essential (primary) hypertension: Secondary | ICD-10-CM | POA: Diagnosis not present

## 2014-07-09 DIAGNOSIS — Z452 Encounter for adjustment and management of vascular access device: Secondary | ICD-10-CM | POA: Diagnosis not present

## 2014-07-09 DIAGNOSIS — T85693D Other mechanical complication of artificial skin graft and decellularized allodermis, subsequent encounter: Secondary | ICD-10-CM | POA: Diagnosis not present

## 2014-07-09 DIAGNOSIS — Z792 Long term (current) use of antibiotics: Secondary | ICD-10-CM | POA: Diagnosis not present

## 2014-07-09 DIAGNOSIS — L97929 Non-pressure chronic ulcer of unspecified part of left lower leg with unspecified severity: Secondary | ICD-10-CM | POA: Diagnosis not present

## 2014-07-10 DIAGNOSIS — L97929 Non-pressure chronic ulcer of unspecified part of left lower leg with unspecified severity: Secondary | ICD-10-CM | POA: Diagnosis not present

## 2014-07-10 DIAGNOSIS — Z792 Long term (current) use of antibiotics: Secondary | ICD-10-CM | POA: Diagnosis not present

## 2014-07-10 DIAGNOSIS — T85693D Other mechanical complication of artificial skin graft and decellularized allodermis, subsequent encounter: Secondary | ICD-10-CM | POA: Diagnosis not present

## 2014-07-10 DIAGNOSIS — I10 Essential (primary) hypertension: Secondary | ICD-10-CM | POA: Diagnosis not present

## 2014-07-10 DIAGNOSIS — Z452 Encounter for adjustment and management of vascular access device: Secondary | ICD-10-CM | POA: Diagnosis not present

## 2014-07-14 DIAGNOSIS — I10 Essential (primary) hypertension: Secondary | ICD-10-CM | POA: Diagnosis not present

## 2014-07-14 DIAGNOSIS — Z452 Encounter for adjustment and management of vascular access device: Secondary | ICD-10-CM | POA: Diagnosis not present

## 2014-07-14 DIAGNOSIS — L97929 Non-pressure chronic ulcer of unspecified part of left lower leg with unspecified severity: Secondary | ICD-10-CM | POA: Diagnosis not present

## 2014-07-14 DIAGNOSIS — Z792 Long term (current) use of antibiotics: Secondary | ICD-10-CM | POA: Diagnosis not present

## 2014-07-14 DIAGNOSIS — T85693D Other mechanical complication of artificial skin graft and decellularized allodermis, subsequent encounter: Secondary | ICD-10-CM | POA: Diagnosis not present

## 2014-07-16 DIAGNOSIS — T85693D Other mechanical complication of artificial skin graft and decellularized allodermis, subsequent encounter: Secondary | ICD-10-CM | POA: Diagnosis not present

## 2014-07-16 DIAGNOSIS — L97929 Non-pressure chronic ulcer of unspecified part of left lower leg with unspecified severity: Secondary | ICD-10-CM | POA: Diagnosis not present

## 2014-07-16 DIAGNOSIS — I10 Essential (primary) hypertension: Secondary | ICD-10-CM | POA: Diagnosis not present

## 2014-07-16 DIAGNOSIS — Z792 Long term (current) use of antibiotics: Secondary | ICD-10-CM | POA: Diagnosis not present

## 2014-07-16 DIAGNOSIS — Z452 Encounter for adjustment and management of vascular access device: Secondary | ICD-10-CM | POA: Diagnosis not present

## 2014-07-17 DIAGNOSIS — I10 Essential (primary) hypertension: Secondary | ICD-10-CM | POA: Diagnosis not present

## 2014-07-17 DIAGNOSIS — L97929 Non-pressure chronic ulcer of unspecified part of left lower leg with unspecified severity: Secondary | ICD-10-CM | POA: Diagnosis not present

## 2014-07-17 DIAGNOSIS — Z452 Encounter for adjustment and management of vascular access device: Secondary | ICD-10-CM | POA: Diagnosis not present

## 2014-07-17 DIAGNOSIS — T85693D Other mechanical complication of artificial skin graft and decellularized allodermis, subsequent encounter: Secondary | ICD-10-CM | POA: Diagnosis not present

## 2014-07-17 DIAGNOSIS — Z792 Long term (current) use of antibiotics: Secondary | ICD-10-CM | POA: Diagnosis not present

## 2014-07-20 DIAGNOSIS — I10 Essential (primary) hypertension: Secondary | ICD-10-CM | POA: Diagnosis not present

## 2014-07-20 DIAGNOSIS — Z792 Long term (current) use of antibiotics: Secondary | ICD-10-CM | POA: Diagnosis not present

## 2014-07-20 DIAGNOSIS — Z452 Encounter for adjustment and management of vascular access device: Secondary | ICD-10-CM | POA: Diagnosis not present

## 2014-07-20 DIAGNOSIS — L97929 Non-pressure chronic ulcer of unspecified part of left lower leg with unspecified severity: Secondary | ICD-10-CM | POA: Diagnosis not present

## 2014-07-20 DIAGNOSIS — T85693D Other mechanical complication of artificial skin graft and decellularized allodermis, subsequent encounter: Secondary | ICD-10-CM | POA: Diagnosis not present

## 2014-07-21 DIAGNOSIS — L97322 Non-pressure chronic ulcer of left ankle with fat layer exposed: Secondary | ICD-10-CM | POA: Diagnosis not present

## 2014-07-21 DIAGNOSIS — L97329 Non-pressure chronic ulcer of left ankle with unspecified severity: Secondary | ICD-10-CM | POA: Diagnosis not present

## 2014-07-21 DIAGNOSIS — Z9889 Other specified postprocedural states: Secondary | ICD-10-CM | POA: Diagnosis not present

## 2014-07-28 DIAGNOSIS — L97929 Non-pressure chronic ulcer of unspecified part of left lower leg with unspecified severity: Secondary | ICD-10-CM | POA: Diagnosis not present

## 2014-07-28 DIAGNOSIS — Z792 Long term (current) use of antibiotics: Secondary | ICD-10-CM | POA: Diagnosis not present

## 2014-07-28 DIAGNOSIS — I10 Essential (primary) hypertension: Secondary | ICD-10-CM | POA: Diagnosis not present

## 2014-07-28 DIAGNOSIS — T85693D Other mechanical complication of artificial skin graft and decellularized allodermis, subsequent encounter: Secondary | ICD-10-CM | POA: Diagnosis not present

## 2014-07-28 DIAGNOSIS — Z452 Encounter for adjustment and management of vascular access device: Secondary | ICD-10-CM | POA: Diagnosis not present

## 2014-07-29 ENCOUNTER — Encounter (HOSPITAL_COMMUNITY): Payer: Self-pay | Admitting: Physical Medicine and Rehabilitation

## 2014-07-29 ENCOUNTER — Emergency Department (HOSPITAL_COMMUNITY)
Admission: EM | Admit: 2014-07-29 | Discharge: 2014-07-29 | Disposition: A | Discharge status: Home / Self Care | Payer: Medicare Other | Attending: Emergency Medicine | Admitting: Emergency Medicine

## 2014-07-29 ENCOUNTER — Emergency Department (HOSPITAL_COMMUNITY): Payer: Medicare Other

## 2014-07-29 DIAGNOSIS — L97329 Non-pressure chronic ulcer of left ankle with unspecified severity: Secondary | ICD-10-CM | POA: Diagnosis not present

## 2014-07-29 DIAGNOSIS — L97309 Non-pressure chronic ulcer of unspecified ankle with unspecified severity: Secondary | ICD-10-CM | POA: Diagnosis not present

## 2014-07-29 DIAGNOSIS — I872 Venous insufficiency (chronic) (peripheral): Secondary | ICD-10-CM | POA: Insufficient documentation

## 2014-07-29 DIAGNOSIS — L89522 Pressure ulcer of left ankle, stage 2: Secondary | ICD-10-CM | POA: Diagnosis not present

## 2014-07-29 DIAGNOSIS — Z8739 Personal history of other diseases of the musculoskeletal system and connective tissue: Secondary | ICD-10-CM | POA: Diagnosis not present

## 2014-07-29 DIAGNOSIS — L98499 Non-pressure chronic ulcer of skin of other sites with unspecified severity: Secondary | ICD-10-CM | POA: Diagnosis present

## 2014-07-29 DIAGNOSIS — Z87891 Personal history of nicotine dependence: Secondary | ICD-10-CM | POA: Diagnosis not present

## 2014-07-29 DIAGNOSIS — L97929 Non-pressure chronic ulcer of unspecified part of left lower leg with unspecified severity: Secondary | ICD-10-CM

## 2014-07-29 DIAGNOSIS — Z880 Allergy status to penicillin: Secondary | ICD-10-CM | POA: Insufficient documentation

## 2014-07-29 DIAGNOSIS — Z792 Long term (current) use of antibiotics: Secondary | ICD-10-CM | POA: Diagnosis not present

## 2014-07-29 DIAGNOSIS — L089 Local infection of the skin and subcutaneous tissue, unspecified: Secondary | ICD-10-CM | POA: Diagnosis not present

## 2014-07-29 DIAGNOSIS — E11622 Type 2 diabetes mellitus with other skin ulcer: Secondary | ICD-10-CM | POA: Diagnosis not present

## 2014-07-29 DIAGNOSIS — L97919 Non-pressure chronic ulcer of unspecified part of right lower leg with unspecified severity: Secondary | ICD-10-CM

## 2014-07-29 DIAGNOSIS — Z79899 Other long term (current) drug therapy: Secondary | ICD-10-CM | POA: Insufficient documentation

## 2014-07-29 DIAGNOSIS — S91002A Unspecified open wound, left ankle, initial encounter: Secondary | ICD-10-CM | POA: Diagnosis not present

## 2014-07-29 DIAGNOSIS — T798XXA Other early complications of trauma, initial encounter: Secondary | ICD-10-CM

## 2014-07-29 LAB — CBC WITH DIFFERENTIAL/PLATELET
Basophils Absolute: 0.1 10*3/uL (ref 0.0–0.1)
Basophils Relative: 1 % (ref 0–1)
Eosinophils Absolute: 0.2 10*3/uL (ref 0.0–0.7)
Eosinophils Relative: 4 % (ref 0–5)
HCT: 36 % (ref 36.0–46.0)
HEMOGLOBIN: 11.4 g/dL — AB (ref 12.0–15.0)
LYMPHS ABS: 1.3 10*3/uL (ref 0.7–4.0)
LYMPHS PCT: 36 % (ref 12–46)
MCH: 31.5 pg (ref 26.0–34.0)
MCHC: 31.7 g/dL (ref 30.0–36.0)
MCV: 99.4 fL (ref 78.0–100.0)
MONOS PCT: 7 % (ref 3–12)
Monocytes Absolute: 0.3 10*3/uL (ref 0.1–1.0)
NEUTROS PCT: 52 % (ref 43–77)
Neutro Abs: 1.8 10*3/uL (ref 1.7–7.7)
PLATELETS: 347 10*3/uL (ref 150–400)
RBC: 3.62 MIL/uL — AB (ref 3.87–5.11)
RDW: 13.9 % (ref 11.5–15.5)
WBC: 3.6 10*3/uL — AB (ref 4.0–10.5)

## 2014-07-29 LAB — BASIC METABOLIC PANEL
ANION GAP: 9 (ref 5–15)
BUN: 9 mg/dL (ref 6–20)
CALCIUM: 8.9 mg/dL (ref 8.9–10.3)
CO2: 26 mmol/L (ref 22–32)
CREATININE: 0.54 mg/dL (ref 0.44–1.00)
Chloride: 103 mmol/L (ref 101–111)
GFR calc non Af Amer: >60 mL/min (ref >60)
Glucose, Bld: 83 mg/dL (ref 65–99)
Potassium: 4.1 mmol/L (ref 3.5–5.1)
Sodium: 138 mmol/L (ref 135–145)

## 2014-07-29 MED ORDER — DOXYCYCLINE HYCLATE 100 MG PO CAPS: 100.0000 mg | ORAL_CAPSULE | Freq: Two times a day (BID) | ORAL | Status: DC

## 2014-07-29 NOTE — ED Notes (Signed)
Foul smelling open wound noted to L foot.

## 2014-07-29 NOTE — ED Provider Notes (Signed)
CSN: [id number]     Arrival date & time 07/29/14  1349 History   First MD Initiated Contact with Patient 07/29/14 1503     Chief Complaint  Patient presents with  . Skin Ulcer     (Consider location/radiation/quality/duration/timing/severity/associated sxs/prior Treatment) HPI Kayla Reynolds is a 69 y.o. female history of peripheral vascular disease and chronic bilateral lower extremity ulcers, presents to emergency department complaining of worsening left ulcers to the left ankle and foot. Patient had grafts to the left ankle ulcer done because the ulcer was nonhealing. She was then referred to wound center where she sounds like had Unna boot to the left leg that has been changed weekly by a home nurse. Patient's dressings were last changed yesterday and a nurse became concerned because she had increased swelling to the left heel, worsening ulcer to the ankle and 2 new lesions to the foot. Daughter states they were not present one week ago. Patient in fact was seen by the wound doctor a week ago and everything was going well. Patient denies any fever or chills. Daughter states they elevated her foot yesterday and states swelling went down. At present there is no redness or swelling to the foot. The concern is new ulcerations. Patient's wound care doctor will not be back in town until the 18th of this month which is in 12 days, and they were told to come to the emergency department. Patient denies any pain in the leg.  Past Medical History  Diagnosis Date  . Arthritis   . Peripheral vascular disease   . Ulcer     BLE ulcerations   Past Surgical History  Procedure Laterality Date  . Joint replacement      Bilateral Hip replacements  . Toe amputation      Left great toe amputation  . Skin graft      Graft from Thigh --- to left foot  . Eye surgery      Bilateral cataract surgery   Family History  Problem Relation Age of Onset  . Cancer Other   . Hypertension Other   . Stroke Other   .  Diabetes Other    History  Substance Use Topics  . Smoking status: Former Smoker    Quit date: 01/24/2000  . Smokeless tobacco: Not on file  . Alcohol Use: No   OB History    No data available     Review of Systems  Constitutional: Negative for fever and chills.  Skin: Positive for color change and wound.  All other systems reviewed and are negative.     Allergies  Asa; Penicillins; and Sulfa antibiotics  Home Medications   Prior to Admission medications   Medication Sig Start Date End Date Taking? Authorizing Provider  benazepril (LOTENSIN) 20 MG tablet Take 20 mg by mouth daily.    Historical Provider, MD  ciprofloxacin (CIPRO) 500 MG tablet Take 500 mg by mouth 2 (two) times daily.    Historical Provider, MD  fluorometholone (FML) 0.1 % ophthalmic suspension Apply 1 drop to eye every 4 (four) hours.    Historical Provider, MD  gabapentin (NEURONTIN) 600 MG tablet Take 600 mg by mouth 3 (three) times daily.    Historical Provider, MD  Pincus Sanes 1 G injection  09/15/11   Historical Provider, MD  meclizine (ANTIVERT) 12.5 MG tablet Take 12.5 mg by mouth 3 (three) times daily as needed.    Historical Provider, MD   BP 139/79 mmHg  Pulse 82  Temp(Src) 98  F (36.7 C) (Oral)  Resp 18  Ht 5\' 5"  (1.651 m)  Wt 208 lb (94.348 kg)  BMI 34.61 kg/m2  SpO2 99% Physical Exam  Constitutional: She appears well-developed and well-nourished. No distress.  HENT:  Head: Normocephalic.  Eyes: Conjunctivae are normal.  Neck: Neck supple.  Cardiovascular: Normal rate, regular rhythm and normal heart sounds.   Pulmonary/Chest: Effort normal and breath sounds normal. No respiratory distress. She has no wheezes. She has no rales.  Musculoskeletal: She exhibits no edema.  Neurological: She is alert.  Skin: Skin is warm and dry.  Large ulceration to the left ankle, stage II, with some type of cream to the top of it. Skin inside ulcerations pain, with just mild yellow drainage. There is 2 less  than 1 cm ulcerations to the left lateral foot and left lateral dorsal foot. Both with purulent drainage. There is no erythema over the ankle, heel, foot. Foot and ankle are not warm to the touch. No tenderness.  Psychiatric: She has a normal mood and affect. Her behavior is normal.  Nursing note and vitals reviewed.   ED Course  Procedures (including critical care time) Labs Review Labs Reviewed  CBC WITH DIFFERENTIAL/PLATELET - Abnormal; Notable for the following:    WBC 3.6 (*)    RBC 3.62 (*)    Hemoglobin 11.4 (*)    All other components within normal limits  BASIC METABOLIC PANEL    Imaging Review No results found.   EKG Interpretation None      MDM   Final diagnoses:  Venous ulcers of both lower extremities  Infected wound, initial encounter   patient with peripheral vascular disease, here with worsening ulcer to left ankle and foot. She is currently a patient of the wound care center. States her doctors out of town for 2 weeks and he told her come here. On exam, there is ulceration to the left ankle and 2 more small spots to the foot. I cannot express any drainage, however there is some purulent dried up material to the ulceration. Patient is afebrile, nontoxic appearing. Normal vital signs. Check labs, white blood cell count, will get x-rays.  4:19 PM X-rays negative, white cell count is normal. No evidence of cellulitis to the ankle or foot. Will start on antibiotics, close follow with primary care doctor 1 cm within the next 2 days for recheck. Patient and her daughter agree with the plan. They will return if wounds rapidly worsening.  Filed Vitals:   07/29/14 1355 07/29/14 1700  BP: 139/79 102/55  Pulse: 82 72  Temp: 98 F (36.7 C) 98.6 F (37 C)  TempSrc: Oral Oral  Resp: 18 18  Height: 5\' 5"  (1.651 m)   Weight: 208 lb (94.348 kg)   SpO2: 99% 100%       09/29/14, PA-C 07/30/14 0025  Jaynie Crumble, MD 08/03/14 0730

## 2014-07-29 NOTE — ED Notes (Signed)
Pedal and posterior tibial pulse marked on pt's left foot/leg

## 2014-07-29 NOTE — Discharge Instructions (Signed)
Start doxycycline as prescribed daily. Follow up with primary care doctor or wound center within 48hrs for recheck. Return to ER if rapidly worsening.         Stasis Ulcer Stasis ulcers occur in the legs when the circulation is damaged. An ulcer may look like a small hole in the skin.  CAUSES Stasis ulcers occur because your veins do not work properly. Veins have valves that help the blood return to the heart. If these valves do not work right, blood flows backwards and backs up into the veins near the skin. This condition causes the veins to become larger because of increased pressure and may lead to a stasis ulcer. SYMPTOMS   Shallow (superficial) sore on the leg.  Clear drainage or weeping from the sore.  Leg pain or a feeling of heaviness. This may be worse at the end of the day.  Leg swelling.  Skin color changes. DIAGNOSIS  Your caregiver will make a diagnosis by examining your leg. Your caregiver may order tests such as an ultrasound or other studies to evaluate the blood flow of the leg. HOME CARE INSTRUCTIONS   Do not stand or sit in one position for long periods of time. Do not sit with your legs crossed. Rest with your legs raised during the day. If possible, it is best if you can elevate your legs above your heart for 30 minutes, 3 to 4 times a day.  Wear elastic stockings or support hose. Do not wear other tight encircling garments around legs, pelvis, or waist. This causes increased pressure in your veins. If your caregiver has applied compressive medicated wraps, use them as instructed.  Walk as much as possible to increase blood flow. If you are taking long rides in a car or plane, take a break to walk around every 2 hours. If not already on aspirin, take a baby aspirin before long trips unless you have medical reasons that prohibit this.  Raise the foot of your bed at night with 2-inch blocks if approved by your caregiver. This may not be desirable if you have heart  failure or breathing problems.  If you get a cut in the skin over the vein and the vein bleeds, lie down with your leg raised and gently clean the area with a clean cloth. Apply pressure on the cut until the bleeding stops. Then place a dressing on the cut. See your caregiver if it continues to bleed or needs stitches. Also, see your caregiver if you develop an infection.Signs of an infection include a fever, redness, increased pain, and drainage of pus.  If your caregiver has given you a follow-up appointment, it is very important to keep that appointment. Not keeping the appointment could result in a chronic or permanent injury, pain, and disability. If there is any problem keeping the appointment, call your caregiver for assistance. SEEK IMMEDIATE MEDICAL CARE IF:  The ulcer area starts to break down.  You have pain, redness, tenderness, pus, or hard swelling in your leg over a vein or near the ulcer.  Your leg pain is uncomfortable.  You develop an unexplained fever.  You develop chest pain or shortness of breath. Document Released: 10/04/2000 Document Revised: 04/03/2011 Document Reviewed: 05/01/2010 [facility] Patient Information 2015 [place], [place]. This information is not intended to replace advice given to you by your health care provider. Make sure you discuss any questions you have with your health care provider.  Wound Infection A wound infection happens when a type  of germ (bacteria) starts growing in the wound. In some cases, this can cause the wound to break open. If cared for properly, the infected wound will heal from the inside to the outside. Wound infections need treatment. CAUSES An infection is caused by bacteria growing in the wound.  SYMPTOMS   Increase in redness, swelling, or pain at the wound site.  Increase in drainage at the wound site.  Wound or bandage (dressing) starts to smell bad.  Fever.  Feeling tired or fatigued.  Pus draining from the  wound. TREATMENT  Your health care provider will prescribe antibiotic medicine. The wound infection should improve within 24 to 48 hours. Any redness around the wound should stop spreading and the wound should be less painful.  HOME CARE INSTRUCTIONS   Only take over-the-counter or prescription medicines for pain, discomfort, or fever as directed by your health care provider.  Take your antibiotics as directed. Finish them even if you start to feel better.  Gently wash the area with mild soap and water 2 times a day, or as directed. Rinse off the soap. Pat the area dry with a clean towel. Do not rub the wound. This may cause bleeding.  Follow your health care provider's instructions for how often you need to change the dressing.  Apply ointment and a dressing to the wound as directed.  If the dressing sticks, moisten it with soapy water and gently remove it.  Change the bandage right away if it becomes wet, dirty, or develops a bad smell.  Take showers. Do not take tub baths, swim, or do anything that may soak the wound until it is healed.  Avoid exercises that make you sweat heavily.  Use anti-itch medicine as directed by your health care provider. The wound may itch when it is healing. Do not pick or scratch at the wound.  Follow up with your health care provider to get your wound rechecked as directed. SEEK MEDICAL CARE IF:  You have an increase in swelling, pain, or redness around the wound.  You have an increase in the amount of pus coming from the wound.  There is a bad smell coming from the wound.  More of the wound breaks open.  You have a fever. MAKE SURE YOU:   Understand these instructions.  Will watch your condition.  Will get help right away if you are not doing well or get worse. Document Released: 10/08/2002 Document Revised: 01/14/2013 Document Reviewed: 05/15/2010 [facility] Patient Information 2015 [place], [place]. This information is not intended to  replace advice given to you by your health care provider. Make sure you discuss any questions you have with your health care provider.

## 2014-07-29 NOTE — ED Notes (Addendum)
Pt reports L foot redness and skin ulcer, reports issues in the past with same. History of PVD. Denies pain to site, reports severe itching. Pt is alert and oriented x4.

## 2014-08-03 DIAGNOSIS — Z880 Allergy status to penicillin: Secondary | ICD-10-CM | POA: Diagnosis not present

## 2014-08-03 DIAGNOSIS — T85693A Other mechanical complication of artificial skin graft and decellularized allodermis, initial encounter: Secondary | ICD-10-CM | POA: Diagnosis not present

## 2014-08-03 DIAGNOSIS — I878 Other specified disorders of veins: Secondary | ICD-10-CM | POA: Diagnosis present

## 2014-08-03 DIAGNOSIS — L03116 Cellulitis of left lower limb: Secondary | ICD-10-CM | POA: Diagnosis present

## 2014-08-03 DIAGNOSIS — R279 Unspecified lack of coordination: Secondary | ICD-10-CM | POA: Diagnosis not present

## 2014-08-03 DIAGNOSIS — Z96643 Presence of artificial hip joint, bilateral: Secondary | ICD-10-CM | POA: Diagnosis not present

## 2014-08-03 DIAGNOSIS — B964 Proteus (mirabilis) (morganii) as the cause of diseases classified elsewhere: Secondary | ICD-10-CM | POA: Diagnosis not present

## 2014-08-03 DIAGNOSIS — Z7401 Bed confinement status: Secondary | ICD-10-CM | POA: Diagnosis not present

## 2014-08-03 DIAGNOSIS — Z8739 Personal history of other diseases of the musculoskeletal system and connective tissue: Secondary | ICD-10-CM | POA: Diagnosis not present

## 2014-08-03 DIAGNOSIS — Z79891 Long term (current) use of opiate analgesic: Secondary | ICD-10-CM | POA: Diagnosis not present

## 2014-08-03 DIAGNOSIS — I872 Venous insufficiency (chronic) (peripheral): Secondary | ICD-10-CM | POA: Diagnosis not present

## 2014-08-03 DIAGNOSIS — T85613A Breakdown (mechanical) of artificial skin graft and decellularized allodermis, initial encounter: Secondary | ICD-10-CM | POA: Diagnosis not present

## 2014-08-03 DIAGNOSIS — T86821 Skin graft (allograft) (autograft) failure: Secondary | ICD-10-CM | POA: Diagnosis not present

## 2014-08-03 DIAGNOSIS — Z886 Allergy status to analgesic agent status: Secondary | ICD-10-CM | POA: Diagnosis not present

## 2014-08-03 DIAGNOSIS — Z89419 Acquired absence of unspecified great toe: Secondary | ICD-10-CM | POA: Diagnosis not present

## 2014-08-03 DIAGNOSIS — M169 Osteoarthritis of hip, unspecified: Secondary | ICD-10-CM | POA: Diagnosis not present

## 2014-08-03 DIAGNOSIS — Z79899 Other long term (current) drug therapy: Secondary | ICD-10-CM | POA: Diagnosis not present

## 2014-08-03 DIAGNOSIS — Z6835 Body mass index (BMI) 35.0-35.9, adult: Secondary | ICD-10-CM | POA: Diagnosis not present

## 2014-08-03 DIAGNOSIS — L97329 Non-pressure chronic ulcer of left ankle with unspecified severity: Secondary | ICD-10-CM | POA: Diagnosis not present

## 2014-08-03 DIAGNOSIS — I10 Essential (primary) hypertension: Secondary | ICD-10-CM | POA: Diagnosis not present

## 2014-08-03 DIAGNOSIS — Z780 Asymptomatic menopausal state: Secondary | ICD-10-CM | POA: Diagnosis not present

## 2014-08-03 DIAGNOSIS — L97929 Non-pressure chronic ulcer of unspecified part of left lower leg with unspecified severity: Secondary | ICD-10-CM | POA: Diagnosis not present

## 2014-08-07 DIAGNOSIS — T85693A Other mechanical complication of artificial skin graft and decellularized allodermis, initial encounter: Secondary | ICD-10-CM | POA: Diagnosis not present

## 2014-08-07 DIAGNOSIS — R279 Unspecified lack of coordination: Secondary | ICD-10-CM | POA: Diagnosis not present

## 2014-08-07 DIAGNOSIS — L97929 Non-pressure chronic ulcer of unspecified part of left lower leg with unspecified severity: Secondary | ICD-10-CM | POA: Diagnosis not present

## 2014-08-07 DIAGNOSIS — Z8739 Personal history of other diseases of the musculoskeletal system and connective tissue: Secondary | ICD-10-CM | POA: Diagnosis not present

## 2014-08-07 DIAGNOSIS — M169 Osteoarthritis of hip, unspecified: Secondary | ICD-10-CM | POA: Diagnosis not present

## 2014-08-07 DIAGNOSIS — L97529 Non-pressure chronic ulcer of other part of left foot with unspecified severity: Secondary | ICD-10-CM | POA: Diagnosis not present

## 2014-08-07 DIAGNOSIS — T86821 Skin graft (allograft) (autograft) failure: Secondary | ICD-10-CM | POA: Diagnosis not present

## 2014-08-07 DIAGNOSIS — L03116 Cellulitis of left lower limb: Secondary | ICD-10-CM | POA: Diagnosis not present

## 2014-08-07 DIAGNOSIS — L97522 Non-pressure chronic ulcer of other part of left foot with fat layer exposed: Secondary | ICD-10-CM | POA: Diagnosis not present

## 2014-08-07 DIAGNOSIS — I872 Venous insufficiency (chronic) (peripheral): Secondary | ICD-10-CM | POA: Diagnosis not present

## 2014-08-07 DIAGNOSIS — I10 Essential (primary) hypertension: Secondary | ICD-10-CM | POA: Diagnosis not present

## 2014-08-07 DIAGNOSIS — Z7401 Bed confinement status: Secondary | ICD-10-CM | POA: Diagnosis not present

## 2014-08-07 DIAGNOSIS — B964 Proteus (mirabilis) (morganii) as the cause of diseases classified elsewhere: Secondary | ICD-10-CM | POA: Diagnosis not present

## 2014-08-07 DIAGNOSIS — Z96643 Presence of artificial hip joint, bilateral: Secondary | ICD-10-CM | POA: Diagnosis not present

## 2014-08-07 DIAGNOSIS — L97329 Non-pressure chronic ulcer of left ankle with unspecified severity: Secondary | ICD-10-CM | POA: Diagnosis not present

## 2014-08-07 DIAGNOSIS — Z89419 Acquired absence of unspecified great toe: Secondary | ICD-10-CM | POA: Diagnosis not present

## 2014-08-18 DIAGNOSIS — I872 Venous insufficiency (chronic) (peripheral): Secondary | ICD-10-CM | POA: Diagnosis not present

## 2014-08-18 DIAGNOSIS — L97529 Non-pressure chronic ulcer of other part of left foot with unspecified severity: Secondary | ICD-10-CM | POA: Diagnosis not present

## 2014-08-18 DIAGNOSIS — L97329 Non-pressure chronic ulcer of left ankle with unspecified severity: Secondary | ICD-10-CM | POA: Diagnosis not present

## 2014-08-18 DIAGNOSIS — L97522 Non-pressure chronic ulcer of other part of left foot with fat layer exposed: Secondary | ICD-10-CM | POA: Diagnosis not present

## 2014-08-21 DIAGNOSIS — L97521 Non-pressure chronic ulcer of other part of left foot limited to breakdown of skin: Secondary | ICD-10-CM | POA: Diagnosis not present

## 2014-08-21 DIAGNOSIS — T86828 Other complications of skin graft (allograft) (autograft): Secondary | ICD-10-CM | POA: Diagnosis not present

## 2014-08-21 DIAGNOSIS — I10 Essential (primary) hypertension: Secondary | ICD-10-CM | POA: Diagnosis not present

## 2014-08-21 DIAGNOSIS — L03116 Cellulitis of left lower limb: Secondary | ICD-10-CM | POA: Diagnosis not present

## 2014-08-21 DIAGNOSIS — I872 Venous insufficiency (chronic) (peripheral): Secondary | ICD-10-CM | POA: Diagnosis not present

## 2014-08-24 DIAGNOSIS — I872 Venous insufficiency (chronic) (peripheral): Secondary | ICD-10-CM | POA: Diagnosis not present

## 2014-08-24 DIAGNOSIS — L97521 Non-pressure chronic ulcer of other part of left foot limited to breakdown of skin: Secondary | ICD-10-CM | POA: Diagnosis not present

## 2014-08-24 DIAGNOSIS — L03116 Cellulitis of left lower limb: Secondary | ICD-10-CM | POA: Diagnosis not present

## 2014-08-24 DIAGNOSIS — I10 Essential (primary) hypertension: Secondary | ICD-10-CM | POA: Diagnosis not present

## 2014-08-24 DIAGNOSIS — T86828 Other complications of skin graft (allograft) (autograft): Secondary | ICD-10-CM | POA: Diagnosis not present

## 2014-08-25 DIAGNOSIS — I10 Essential (primary) hypertension: Secondary | ICD-10-CM | POA: Diagnosis not present

## 2014-08-25 DIAGNOSIS — I872 Venous insufficiency (chronic) (peripheral): Secondary | ICD-10-CM | POA: Diagnosis not present

## 2014-08-25 DIAGNOSIS — L97521 Non-pressure chronic ulcer of other part of left foot limited to breakdown of skin: Secondary | ICD-10-CM | POA: Diagnosis not present

## 2014-08-25 DIAGNOSIS — L03116 Cellulitis of left lower limb: Secondary | ICD-10-CM | POA: Diagnosis not present

## 2014-08-25 DIAGNOSIS — T86828 Other complications of skin graft (allograft) (autograft): Secondary | ICD-10-CM | POA: Diagnosis not present

## 2014-08-27 DIAGNOSIS — I872 Venous insufficiency (chronic) (peripheral): Secondary | ICD-10-CM | POA: Diagnosis not present

## 2014-08-27 DIAGNOSIS — L97521 Non-pressure chronic ulcer of other part of left foot limited to breakdown of skin: Secondary | ICD-10-CM | POA: Diagnosis not present

## 2014-08-27 DIAGNOSIS — T86828 Other complications of skin graft (allograft) (autograft): Secondary | ICD-10-CM | POA: Diagnosis not present

## 2014-08-27 DIAGNOSIS — I10 Essential (primary) hypertension: Secondary | ICD-10-CM | POA: Diagnosis not present

## 2014-08-27 DIAGNOSIS — L03116 Cellulitis of left lower limb: Secondary | ICD-10-CM | POA: Diagnosis not present

## 2014-08-28 DIAGNOSIS — L03116 Cellulitis of left lower limb: Secondary | ICD-10-CM | POA: Diagnosis not present

## 2014-08-28 DIAGNOSIS — T86828 Other complications of skin graft (allograft) (autograft): Secondary | ICD-10-CM | POA: Diagnosis not present

## 2014-08-28 DIAGNOSIS — I872 Venous insufficiency (chronic) (peripheral): Secondary | ICD-10-CM | POA: Diagnosis not present

## 2014-08-28 DIAGNOSIS — I10 Essential (primary) hypertension: Secondary | ICD-10-CM | POA: Diagnosis not present

## 2014-08-28 DIAGNOSIS — L97521 Non-pressure chronic ulcer of other part of left foot limited to breakdown of skin: Secondary | ICD-10-CM | POA: Diagnosis not present

## 2014-08-31 DIAGNOSIS — I10 Essential (primary) hypertension: Secondary | ICD-10-CM | POA: Diagnosis not present

## 2014-08-31 DIAGNOSIS — L03116 Cellulitis of left lower limb: Secondary | ICD-10-CM | POA: Diagnosis not present

## 2014-08-31 DIAGNOSIS — I872 Venous insufficiency (chronic) (peripheral): Secondary | ICD-10-CM | POA: Diagnosis not present

## 2014-08-31 DIAGNOSIS — L97521 Non-pressure chronic ulcer of other part of left foot limited to breakdown of skin: Secondary | ICD-10-CM | POA: Diagnosis not present

## 2014-08-31 DIAGNOSIS — T86828 Other complications of skin graft (allograft) (autograft): Secondary | ICD-10-CM | POA: Diagnosis not present

## 2014-09-02 DIAGNOSIS — T86828 Other complications of skin graft (allograft) (autograft): Secondary | ICD-10-CM | POA: Diagnosis not present

## 2014-09-02 DIAGNOSIS — L97521 Non-pressure chronic ulcer of other part of left foot limited to breakdown of skin: Secondary | ICD-10-CM | POA: Diagnosis not present

## 2014-09-02 DIAGNOSIS — I872 Venous insufficiency (chronic) (peripheral): Secondary | ICD-10-CM | POA: Diagnosis not present

## 2014-09-02 DIAGNOSIS — I10 Essential (primary) hypertension: Secondary | ICD-10-CM | POA: Diagnosis not present

## 2014-09-02 DIAGNOSIS — L03116 Cellulitis of left lower limb: Secondary | ICD-10-CM | POA: Diagnosis not present

## 2014-09-04 DIAGNOSIS — I872 Venous insufficiency (chronic) (peripheral): Secondary | ICD-10-CM | POA: Diagnosis not present

## 2014-09-04 DIAGNOSIS — L97521 Non-pressure chronic ulcer of other part of left foot limited to breakdown of skin: Secondary | ICD-10-CM | POA: Diagnosis not present

## 2014-09-04 DIAGNOSIS — L03116 Cellulitis of left lower limb: Secondary | ICD-10-CM | POA: Diagnosis not present

## 2014-09-04 DIAGNOSIS — I10 Essential (primary) hypertension: Secondary | ICD-10-CM | POA: Diagnosis not present

## 2014-09-04 DIAGNOSIS — T86828 Other complications of skin graft (allograft) (autograft): Secondary | ICD-10-CM | POA: Diagnosis not present

## 2014-09-07 DIAGNOSIS — L97521 Non-pressure chronic ulcer of other part of left foot limited to breakdown of skin: Secondary | ICD-10-CM | POA: Diagnosis not present

## 2014-09-07 DIAGNOSIS — I872 Venous insufficiency (chronic) (peripheral): Secondary | ICD-10-CM | POA: Diagnosis not present

## 2014-09-07 DIAGNOSIS — T86828 Other complications of skin graft (allograft) (autograft): Secondary | ICD-10-CM | POA: Diagnosis not present

## 2014-09-07 DIAGNOSIS — L03116 Cellulitis of left lower limb: Secondary | ICD-10-CM | POA: Diagnosis not present

## 2014-09-07 DIAGNOSIS — I10 Essential (primary) hypertension: Secondary | ICD-10-CM | POA: Diagnosis not present

## 2014-09-08 DIAGNOSIS — T86828 Other complications of skin graft (allograft) (autograft): Secondary | ICD-10-CM | POA: Diagnosis not present

## 2014-09-08 DIAGNOSIS — I10 Essential (primary) hypertension: Secondary | ICD-10-CM | POA: Diagnosis not present

## 2014-09-08 DIAGNOSIS — I872 Venous insufficiency (chronic) (peripheral): Secondary | ICD-10-CM | POA: Diagnosis not present

## 2014-09-08 DIAGNOSIS — L97521 Non-pressure chronic ulcer of other part of left foot limited to breakdown of skin: Secondary | ICD-10-CM | POA: Diagnosis not present

## 2014-09-08 DIAGNOSIS — L03116 Cellulitis of left lower limb: Secondary | ICD-10-CM | POA: Diagnosis not present

## 2014-09-09 DIAGNOSIS — T86828 Other complications of skin graft (allograft) (autograft): Secondary | ICD-10-CM | POA: Diagnosis not present

## 2014-09-09 DIAGNOSIS — L03116 Cellulitis of left lower limb: Secondary | ICD-10-CM | POA: Diagnosis not present

## 2014-09-09 DIAGNOSIS — I10 Essential (primary) hypertension: Secondary | ICD-10-CM | POA: Diagnosis not present

## 2014-09-09 DIAGNOSIS — I872 Venous insufficiency (chronic) (peripheral): Secondary | ICD-10-CM | POA: Diagnosis not present

## 2014-09-09 DIAGNOSIS — L97521 Non-pressure chronic ulcer of other part of left foot limited to breakdown of skin: Secondary | ICD-10-CM | POA: Diagnosis not present

## 2014-09-10 DIAGNOSIS — I10 Essential (primary) hypertension: Secondary | ICD-10-CM | POA: Diagnosis not present

## 2014-09-10 DIAGNOSIS — L03116 Cellulitis of left lower limb: Secondary | ICD-10-CM | POA: Diagnosis not present

## 2014-09-10 DIAGNOSIS — L97521 Non-pressure chronic ulcer of other part of left foot limited to breakdown of skin: Secondary | ICD-10-CM | POA: Diagnosis not present

## 2014-09-10 DIAGNOSIS — I872 Venous insufficiency (chronic) (peripheral): Secondary | ICD-10-CM | POA: Diagnosis not present

## 2014-09-10 DIAGNOSIS — T86828 Other complications of skin graft (allograft) (autograft): Secondary | ICD-10-CM | POA: Diagnosis not present

## 2014-09-14 DIAGNOSIS — L209 Atopic dermatitis, unspecified: Secondary | ICD-10-CM | POA: Diagnosis not present

## 2014-09-15 DIAGNOSIS — L03116 Cellulitis of left lower limb: Secondary | ICD-10-CM | POA: Diagnosis not present

## 2014-09-15 DIAGNOSIS — L97521 Non-pressure chronic ulcer of other part of left foot limited to breakdown of skin: Secondary | ICD-10-CM | POA: Diagnosis not present

## 2014-09-15 DIAGNOSIS — I872 Venous insufficiency (chronic) (peripheral): Secondary | ICD-10-CM | POA: Diagnosis not present

## 2014-09-15 DIAGNOSIS — T86828 Other complications of skin graft (allograft) (autograft): Secondary | ICD-10-CM | POA: Diagnosis not present

## 2014-09-15 DIAGNOSIS — I10 Essential (primary) hypertension: Secondary | ICD-10-CM | POA: Diagnosis not present

## 2014-09-17 DIAGNOSIS — I872 Venous insufficiency (chronic) (peripheral): Secondary | ICD-10-CM | POA: Diagnosis not present

## 2014-09-17 DIAGNOSIS — L03116 Cellulitis of left lower limb: Secondary | ICD-10-CM | POA: Diagnosis not present

## 2014-09-17 DIAGNOSIS — T86828 Other complications of skin graft (allograft) (autograft): Secondary | ICD-10-CM | POA: Diagnosis not present

## 2014-09-17 DIAGNOSIS — I10 Essential (primary) hypertension: Secondary | ICD-10-CM | POA: Diagnosis not present

## 2014-09-17 DIAGNOSIS — L97521 Non-pressure chronic ulcer of other part of left foot limited to breakdown of skin: Secondary | ICD-10-CM | POA: Diagnosis not present

## 2014-09-23 DIAGNOSIS — L97329 Non-pressure chronic ulcer of left ankle with unspecified severity: Secondary | ICD-10-CM | POA: Diagnosis not present

## 2014-09-23 DIAGNOSIS — I83218 Varicose veins of right lower extremity with both ulcer of other part of lower extremity and inflammation: Secondary | ICD-10-CM | POA: Diagnosis not present

## 2014-09-23 DIAGNOSIS — L84 Corns and callosities: Secondary | ICD-10-CM | POA: Diagnosis not present

## 2014-09-23 DIAGNOSIS — I83223 Varicose veins of left lower extremity with both ulcer of ankle and inflammation: Secondary | ICD-10-CM | POA: Diagnosis not present

## 2014-09-23 DIAGNOSIS — L97322 Non-pressure chronic ulcer of left ankle with fat layer exposed: Secondary | ICD-10-CM | POA: Diagnosis not present

## 2014-09-23 DIAGNOSIS — I872 Venous insufficiency (chronic) (peripheral): Secondary | ICD-10-CM | POA: Diagnosis not present

## 2014-09-23 DIAGNOSIS — L97819 Non-pressure chronic ulcer of other part of right lower leg with unspecified severity: Secondary | ICD-10-CM | POA: Diagnosis not present

## 2014-09-30 DIAGNOSIS — L97521 Non-pressure chronic ulcer of other part of left foot limited to breakdown of skin: Secondary | ICD-10-CM | POA: Diagnosis not present

## 2014-09-30 DIAGNOSIS — L03116 Cellulitis of left lower limb: Secondary | ICD-10-CM | POA: Diagnosis not present

## 2014-09-30 DIAGNOSIS — I872 Venous insufficiency (chronic) (peripheral): Secondary | ICD-10-CM | POA: Diagnosis not present

## 2014-09-30 DIAGNOSIS — T86828 Other complications of skin graft (allograft) (autograft): Secondary | ICD-10-CM | POA: Diagnosis not present

## 2014-09-30 DIAGNOSIS — I10 Essential (primary) hypertension: Secondary | ICD-10-CM | POA: Diagnosis not present

## 2014-10-05 DIAGNOSIS — T86828 Other complications of skin graft (allograft) (autograft): Secondary | ICD-10-CM | POA: Diagnosis not present

## 2014-10-05 DIAGNOSIS — L97521 Non-pressure chronic ulcer of other part of left foot limited to breakdown of skin: Secondary | ICD-10-CM | POA: Diagnosis not present

## 2014-10-05 DIAGNOSIS — I872 Venous insufficiency (chronic) (peripheral): Secondary | ICD-10-CM | POA: Diagnosis not present

## 2014-10-05 DIAGNOSIS — I10 Essential (primary) hypertension: Secondary | ICD-10-CM | POA: Diagnosis not present

## 2014-10-05 DIAGNOSIS — L03116 Cellulitis of left lower limb: Secondary | ICD-10-CM | POA: Diagnosis not present

## 2014-10-12 DIAGNOSIS — T86828 Other complications of skin graft (allograft) (autograft): Secondary | ICD-10-CM | POA: Diagnosis not present

## 2014-10-12 DIAGNOSIS — I872 Venous insufficiency (chronic) (peripheral): Secondary | ICD-10-CM | POA: Diagnosis not present

## 2014-10-12 DIAGNOSIS — I10 Essential (primary) hypertension: Secondary | ICD-10-CM | POA: Diagnosis not present

## 2014-10-12 DIAGNOSIS — L03116 Cellulitis of left lower limb: Secondary | ICD-10-CM | POA: Diagnosis not present

## 2014-10-12 DIAGNOSIS — L97521 Non-pressure chronic ulcer of other part of left foot limited to breakdown of skin: Secondary | ICD-10-CM | POA: Diagnosis not present

## 2014-10-16 DIAGNOSIS — Z23 Encounter for immunization: Secondary | ICD-10-CM | POA: Diagnosis not present

## 2014-10-19 DIAGNOSIS — L03116 Cellulitis of left lower limb: Secondary | ICD-10-CM | POA: Diagnosis not present

## 2014-10-19 DIAGNOSIS — I10 Essential (primary) hypertension: Secondary | ICD-10-CM | POA: Diagnosis not present

## 2014-10-19 DIAGNOSIS — I872 Venous insufficiency (chronic) (peripheral): Secondary | ICD-10-CM | POA: Diagnosis not present

## 2014-10-19 DIAGNOSIS — T86828 Other complications of skin graft (allograft) (autograft): Secondary | ICD-10-CM | POA: Diagnosis not present

## 2014-10-19 DIAGNOSIS — L97521 Non-pressure chronic ulcer of other part of left foot limited to breakdown of skin: Secondary | ICD-10-CM | POA: Diagnosis not present

## 2014-10-20 DIAGNOSIS — F419 Anxiety disorder, unspecified: Secondary | ICD-10-CM | POA: Diagnosis not present

## 2014-10-20 DIAGNOSIS — T86828 Other complications of skin graft (allograft) (autograft): Secondary | ICD-10-CM | POA: Diagnosis not present

## 2014-10-20 DIAGNOSIS — F329 Major depressive disorder, single episode, unspecified: Secondary | ICD-10-CM | POA: Diagnosis not present

## 2014-10-20 DIAGNOSIS — I10 Essential (primary) hypertension: Secondary | ICD-10-CM | POA: Diagnosis not present

## 2014-10-20 DIAGNOSIS — L97521 Non-pressure chronic ulcer of other part of left foot limited to breakdown of skin: Secondary | ICD-10-CM | POA: Diagnosis not present

## 2014-10-20 DIAGNOSIS — I872 Venous insufficiency (chronic) (peripheral): Secondary | ICD-10-CM | POA: Diagnosis not present

## 2014-10-21 DIAGNOSIS — I872 Venous insufficiency (chronic) (peripheral): Secondary | ICD-10-CM | POA: Diagnosis not present

## 2014-10-21 DIAGNOSIS — I83223 Varicose veins of left lower extremity with both ulcer of ankle and inflammation: Secondary | ICD-10-CM | POA: Diagnosis not present

## 2014-10-21 DIAGNOSIS — L97329 Non-pressure chronic ulcer of left ankle with unspecified severity: Secondary | ICD-10-CM | POA: Diagnosis not present

## 2014-10-21 DIAGNOSIS — L97322 Non-pressure chronic ulcer of left ankle with fat layer exposed: Secondary | ICD-10-CM | POA: Diagnosis not present

## 2014-10-28 DIAGNOSIS — T86828 Other complications of skin graft (allograft) (autograft): Secondary | ICD-10-CM | POA: Diagnosis not present

## 2014-10-28 DIAGNOSIS — I872 Venous insufficiency (chronic) (peripheral): Secondary | ICD-10-CM | POA: Diagnosis not present

## 2014-10-28 DIAGNOSIS — F419 Anxiety disorder, unspecified: Secondary | ICD-10-CM | POA: Diagnosis not present

## 2014-10-28 DIAGNOSIS — F329 Major depressive disorder, single episode, unspecified: Secondary | ICD-10-CM | POA: Diagnosis not present

## 2014-10-28 DIAGNOSIS — L97521 Non-pressure chronic ulcer of other part of left foot limited to breakdown of skin: Secondary | ICD-10-CM | POA: Diagnosis not present

## 2014-10-28 DIAGNOSIS — I10 Essential (primary) hypertension: Secondary | ICD-10-CM | POA: Diagnosis not present

## 2014-11-02 DIAGNOSIS — J069 Acute upper respiratory infection, unspecified: Secondary | ICD-10-CM | POA: Diagnosis not present

## 2014-11-04 DIAGNOSIS — I10 Essential (primary) hypertension: Secondary | ICD-10-CM | POA: Diagnosis not present

## 2014-11-04 DIAGNOSIS — F419 Anxiety disorder, unspecified: Secondary | ICD-10-CM | POA: Diagnosis not present

## 2014-11-04 DIAGNOSIS — F329 Major depressive disorder, single episode, unspecified: Secondary | ICD-10-CM | POA: Diagnosis not present

## 2014-11-04 DIAGNOSIS — L97521 Non-pressure chronic ulcer of other part of left foot limited to breakdown of skin: Secondary | ICD-10-CM | POA: Diagnosis not present

## 2014-11-04 DIAGNOSIS — I872 Venous insufficiency (chronic) (peripheral): Secondary | ICD-10-CM | POA: Diagnosis not present

## 2014-11-04 DIAGNOSIS — T86828 Other complications of skin graft (allograft) (autograft): Secondary | ICD-10-CM | POA: Diagnosis not present

## 2014-11-11 DIAGNOSIS — L97521 Non-pressure chronic ulcer of other part of left foot limited to breakdown of skin: Secondary | ICD-10-CM | POA: Diagnosis not present

## 2014-11-11 DIAGNOSIS — F419 Anxiety disorder, unspecified: Secondary | ICD-10-CM | POA: Diagnosis not present

## 2014-11-11 DIAGNOSIS — I872 Venous insufficiency (chronic) (peripheral): Secondary | ICD-10-CM | POA: Diagnosis not present

## 2014-11-11 DIAGNOSIS — I10 Essential (primary) hypertension: Secondary | ICD-10-CM | POA: Diagnosis not present

## 2014-11-11 DIAGNOSIS — F329 Major depressive disorder, single episode, unspecified: Secondary | ICD-10-CM | POA: Diagnosis not present

## 2014-11-11 DIAGNOSIS — T86828 Other complications of skin graft (allograft) (autograft): Secondary | ICD-10-CM | POA: Diagnosis not present

## 2014-11-12 DIAGNOSIS — L84 Corns and callosities: Secondary | ICD-10-CM | POA: Diagnosis not present

## 2014-11-12 DIAGNOSIS — I739 Peripheral vascular disease, unspecified: Secondary | ICD-10-CM | POA: Diagnosis not present

## 2014-11-12 DIAGNOSIS — B351 Tinea unguium: Secondary | ICD-10-CM | POA: Diagnosis not present

## 2014-11-12 DIAGNOSIS — M79675 Pain in left toe(s): Secondary | ICD-10-CM | POA: Diagnosis not present

## 2014-11-12 DIAGNOSIS — M79674 Pain in right toe(s): Secondary | ICD-10-CM | POA: Diagnosis not present

## 2014-11-12 DIAGNOSIS — L602 Onychogryphosis: Secondary | ICD-10-CM | POA: Diagnosis not present

## 2014-11-18 DIAGNOSIS — I83223 Varicose veins of left lower extremity with both ulcer of ankle and inflammation: Secondary | ICD-10-CM | POA: Diagnosis not present

## 2014-11-18 DIAGNOSIS — L97322 Non-pressure chronic ulcer of left ankle with fat layer exposed: Secondary | ICD-10-CM | POA: Diagnosis not present

## 2014-11-18 DIAGNOSIS — I872 Venous insufficiency (chronic) (peripheral): Secondary | ICD-10-CM | POA: Diagnosis not present

## 2014-11-18 DIAGNOSIS — L97329 Non-pressure chronic ulcer of left ankle with unspecified severity: Secondary | ICD-10-CM | POA: Diagnosis not present

## 2014-11-20 DIAGNOSIS — M25572 Pain in left ankle and joints of left foot: Secondary | ICD-10-CM | POA: Diagnosis not present

## 2014-11-25 DIAGNOSIS — I10 Essential (primary) hypertension: Secondary | ICD-10-CM | POA: Diagnosis not present

## 2014-11-25 DIAGNOSIS — F329 Major depressive disorder, single episode, unspecified: Secondary | ICD-10-CM | POA: Diagnosis not present

## 2014-11-25 DIAGNOSIS — T86828 Other complications of skin graft (allograft) (autograft): Secondary | ICD-10-CM | POA: Diagnosis not present

## 2014-11-25 DIAGNOSIS — F419 Anxiety disorder, unspecified: Secondary | ICD-10-CM | POA: Diagnosis not present

## 2014-11-25 DIAGNOSIS — L97521 Non-pressure chronic ulcer of other part of left foot limited to breakdown of skin: Secondary | ICD-10-CM | POA: Diagnosis not present

## 2014-11-25 DIAGNOSIS — I872 Venous insufficiency (chronic) (peripheral): Secondary | ICD-10-CM | POA: Diagnosis not present

## 2014-12-03 DIAGNOSIS — L97521 Non-pressure chronic ulcer of other part of left foot limited to breakdown of skin: Secondary | ICD-10-CM | POA: Diagnosis not present

## 2014-12-03 DIAGNOSIS — I872 Venous insufficiency (chronic) (peripheral): Secondary | ICD-10-CM | POA: Diagnosis not present

## 2014-12-03 DIAGNOSIS — T86828 Other complications of skin graft (allograft) (autograft): Secondary | ICD-10-CM | POA: Diagnosis not present

## 2014-12-03 DIAGNOSIS — F329 Major depressive disorder, single episode, unspecified: Secondary | ICD-10-CM | POA: Diagnosis not present

## 2014-12-03 DIAGNOSIS — I10 Essential (primary) hypertension: Secondary | ICD-10-CM | POA: Diagnosis not present

## 2014-12-03 DIAGNOSIS — F419 Anxiety disorder, unspecified: Secondary | ICD-10-CM | POA: Diagnosis not present

## 2014-12-08 DIAGNOSIS — I10 Essential (primary) hypertension: Secondary | ICD-10-CM | POA: Diagnosis not present

## 2014-12-08 DIAGNOSIS — I872 Venous insufficiency (chronic) (peripheral): Secondary | ICD-10-CM | POA: Diagnosis not present

## 2014-12-08 DIAGNOSIS — F329 Major depressive disorder, single episode, unspecified: Secondary | ICD-10-CM | POA: Diagnosis not present

## 2014-12-08 DIAGNOSIS — L97521 Non-pressure chronic ulcer of other part of left foot limited to breakdown of skin: Secondary | ICD-10-CM | POA: Diagnosis not present

## 2014-12-08 DIAGNOSIS — F419 Anxiety disorder, unspecified: Secondary | ICD-10-CM | POA: Diagnosis not present

## 2014-12-08 DIAGNOSIS — T86828 Other complications of skin graft (allograft) (autograft): Secondary | ICD-10-CM | POA: Diagnosis not present

## 2014-12-16 DIAGNOSIS — T86828 Other complications of skin graft (allograft) (autograft): Secondary | ICD-10-CM | POA: Diagnosis not present

## 2014-12-16 DIAGNOSIS — F419 Anxiety disorder, unspecified: Secondary | ICD-10-CM | POA: Diagnosis not present

## 2014-12-16 DIAGNOSIS — L97322 Non-pressure chronic ulcer of left ankle with fat layer exposed: Secondary | ICD-10-CM | POA: Diagnosis not present

## 2014-12-16 DIAGNOSIS — I83223 Varicose veins of left lower extremity with both ulcer of ankle and inflammation: Secondary | ICD-10-CM | POA: Diagnosis not present

## 2014-12-16 DIAGNOSIS — I10 Essential (primary) hypertension: Secondary | ICD-10-CM | POA: Diagnosis not present

## 2014-12-16 DIAGNOSIS — L97521 Non-pressure chronic ulcer of other part of left foot limited to breakdown of skin: Secondary | ICD-10-CM | POA: Diagnosis not present

## 2014-12-16 DIAGNOSIS — I872 Venous insufficiency (chronic) (peripheral): Secondary | ICD-10-CM | POA: Diagnosis not present

## 2014-12-16 DIAGNOSIS — F329 Major depressive disorder, single episode, unspecified: Secondary | ICD-10-CM | POA: Diagnosis not present

## 2014-12-16 DIAGNOSIS — L97529 Non-pressure chronic ulcer of other part of left foot with unspecified severity: Secondary | ICD-10-CM | POA: Diagnosis not present

## 2014-12-16 DIAGNOSIS — L97329 Non-pressure chronic ulcer of left ankle with unspecified severity: Secondary | ICD-10-CM | POA: Diagnosis not present

## 2014-12-19 DIAGNOSIS — F419 Anxiety disorder, unspecified: Secondary | ICD-10-CM | POA: Diagnosis not present

## 2014-12-19 DIAGNOSIS — I10 Essential (primary) hypertension: Secondary | ICD-10-CM | POA: Diagnosis not present

## 2014-12-19 DIAGNOSIS — F329 Major depressive disorder, single episode, unspecified: Secondary | ICD-10-CM | POA: Diagnosis not present

## 2014-12-19 DIAGNOSIS — T86828 Other complications of skin graft (allograft) (autograft): Secondary | ICD-10-CM | POA: Diagnosis not present

## 2014-12-19 DIAGNOSIS — L97521 Non-pressure chronic ulcer of other part of left foot limited to breakdown of skin: Secondary | ICD-10-CM | POA: Diagnosis not present

## 2014-12-19 DIAGNOSIS — I872 Venous insufficiency (chronic) (peripheral): Secondary | ICD-10-CM | POA: Diagnosis not present

## 2014-12-23 DIAGNOSIS — T86828 Other complications of skin graft (allograft) (autograft): Secondary | ICD-10-CM | POA: Diagnosis not present

## 2014-12-23 DIAGNOSIS — I10 Essential (primary) hypertension: Secondary | ICD-10-CM | POA: Diagnosis not present

## 2014-12-23 DIAGNOSIS — L97521 Non-pressure chronic ulcer of other part of left foot limited to breakdown of skin: Secondary | ICD-10-CM | POA: Diagnosis not present

## 2014-12-23 DIAGNOSIS — F419 Anxiety disorder, unspecified: Secondary | ICD-10-CM | POA: Diagnosis not present

## 2014-12-23 DIAGNOSIS — I872 Venous insufficiency (chronic) (peripheral): Secondary | ICD-10-CM | POA: Diagnosis not present

## 2014-12-23 DIAGNOSIS — F329 Major depressive disorder, single episode, unspecified: Secondary | ICD-10-CM | POA: Diagnosis not present

## 2014-12-31 DIAGNOSIS — I10 Essential (primary) hypertension: Secondary | ICD-10-CM | POA: Diagnosis not present

## 2014-12-31 DIAGNOSIS — T86828 Other complications of skin graft (allograft) (autograft): Secondary | ICD-10-CM | POA: Diagnosis not present

## 2014-12-31 DIAGNOSIS — L97521 Non-pressure chronic ulcer of other part of left foot limited to breakdown of skin: Secondary | ICD-10-CM | POA: Diagnosis not present

## 2014-12-31 DIAGNOSIS — F419 Anxiety disorder, unspecified: Secondary | ICD-10-CM | POA: Diagnosis not present

## 2014-12-31 DIAGNOSIS — I872 Venous insufficiency (chronic) (peripheral): Secondary | ICD-10-CM | POA: Diagnosis not present

## 2014-12-31 DIAGNOSIS — F329 Major depressive disorder, single episode, unspecified: Secondary | ICD-10-CM | POA: Diagnosis not present

## 2015-01-06 DIAGNOSIS — L97329 Non-pressure chronic ulcer of left ankle with unspecified severity: Secondary | ICD-10-CM | POA: Diagnosis not present

## 2015-01-06 DIAGNOSIS — I872 Venous insufficiency (chronic) (peripheral): Secondary | ICD-10-CM | POA: Diagnosis not present

## 2015-01-06 DIAGNOSIS — L97322 Non-pressure chronic ulcer of left ankle with fat layer exposed: Secondary | ICD-10-CM | POA: Diagnosis not present

## 2015-01-06 DIAGNOSIS — I83223 Varicose veins of left lower extremity with both ulcer of ankle and inflammation: Secondary | ICD-10-CM | POA: Diagnosis not present

## 2015-01-14 DIAGNOSIS — I10 Essential (primary) hypertension: Secondary | ICD-10-CM | POA: Diagnosis not present

## 2015-01-14 DIAGNOSIS — F329 Major depressive disorder, single episode, unspecified: Secondary | ICD-10-CM | POA: Diagnosis not present

## 2015-01-14 DIAGNOSIS — F419 Anxiety disorder, unspecified: Secondary | ICD-10-CM | POA: Diagnosis not present

## 2015-01-14 DIAGNOSIS — I872 Venous insufficiency (chronic) (peripheral): Secondary | ICD-10-CM | POA: Diagnosis not present

## 2015-01-14 DIAGNOSIS — T86828 Other complications of skin graft (allograft) (autograft): Secondary | ICD-10-CM | POA: Diagnosis not present

## 2015-01-14 DIAGNOSIS — L97521 Non-pressure chronic ulcer of other part of left foot limited to breakdown of skin: Secondary | ICD-10-CM | POA: Diagnosis not present

## 2015-01-20 DIAGNOSIS — F419 Anxiety disorder, unspecified: Secondary | ICD-10-CM | POA: Diagnosis not present

## 2015-01-20 DIAGNOSIS — I10 Essential (primary) hypertension: Secondary | ICD-10-CM | POA: Diagnosis not present

## 2015-01-20 DIAGNOSIS — L97521 Non-pressure chronic ulcer of other part of left foot limited to breakdown of skin: Secondary | ICD-10-CM | POA: Diagnosis not present

## 2015-01-20 DIAGNOSIS — F329 Major depressive disorder, single episode, unspecified: Secondary | ICD-10-CM | POA: Diagnosis not present

## 2015-01-20 DIAGNOSIS — I872 Venous insufficiency (chronic) (peripheral): Secondary | ICD-10-CM | POA: Diagnosis not present

## 2015-01-20 DIAGNOSIS — T86828 Other complications of skin graft (allograft) (autograft): Secondary | ICD-10-CM | POA: Diagnosis not present

## 2015-01-21 DIAGNOSIS — L602 Onychogryphosis: Secondary | ICD-10-CM | POA: Diagnosis not present

## 2015-01-21 DIAGNOSIS — I739 Peripheral vascular disease, unspecified: Secondary | ICD-10-CM | POA: Diagnosis not present

## 2015-01-21 DIAGNOSIS — L84 Corns and callosities: Secondary | ICD-10-CM | POA: Diagnosis not present

## 2015-01-21 DIAGNOSIS — B351 Tinea unguium: Secondary | ICD-10-CM | POA: Diagnosis not present

## 2015-01-21 DIAGNOSIS — M79674 Pain in right toe(s): Secondary | ICD-10-CM | POA: Diagnosis not present

## 2015-01-21 DIAGNOSIS — M79675 Pain in left toe(s): Secondary | ICD-10-CM | POA: Diagnosis not present

## 2015-01-28 DIAGNOSIS — F419 Anxiety disorder, unspecified: Secondary | ICD-10-CM | POA: Diagnosis not present

## 2015-01-28 DIAGNOSIS — T86828 Other complications of skin graft (allograft) (autograft): Secondary | ICD-10-CM | POA: Diagnosis not present

## 2015-01-28 DIAGNOSIS — F329 Major depressive disorder, single episode, unspecified: Secondary | ICD-10-CM | POA: Diagnosis not present

## 2015-01-28 DIAGNOSIS — I872 Venous insufficiency (chronic) (peripheral): Secondary | ICD-10-CM | POA: Diagnosis not present

## 2015-01-28 DIAGNOSIS — I10 Essential (primary) hypertension: Secondary | ICD-10-CM | POA: Diagnosis not present

## 2015-01-28 DIAGNOSIS — L97521 Non-pressure chronic ulcer of other part of left foot limited to breakdown of skin: Secondary | ICD-10-CM | POA: Diagnosis not present

## 2015-02-03 DIAGNOSIS — L97519 Non-pressure chronic ulcer of other part of right foot with unspecified severity: Secondary | ICD-10-CM | POA: Diagnosis not present

## 2015-02-03 DIAGNOSIS — I872 Venous insufficiency (chronic) (peripheral): Secondary | ICD-10-CM | POA: Diagnosis not present

## 2015-02-03 DIAGNOSIS — I83223 Varicose veins of left lower extremity with both ulcer of ankle and inflammation: Secondary | ICD-10-CM | POA: Diagnosis not present

## 2015-02-03 DIAGNOSIS — L97322 Non-pressure chronic ulcer of left ankle with fat layer exposed: Secondary | ICD-10-CM | POA: Diagnosis not present

## 2015-02-03 DIAGNOSIS — L97329 Non-pressure chronic ulcer of left ankle with unspecified severity: Secondary | ICD-10-CM | POA: Diagnosis not present

## 2015-02-04 DIAGNOSIS — I872 Venous insufficiency (chronic) (peripheral): Secondary | ICD-10-CM | POA: Diagnosis not present

## 2015-02-04 DIAGNOSIS — F419 Anxiety disorder, unspecified: Secondary | ICD-10-CM | POA: Diagnosis not present

## 2015-02-04 DIAGNOSIS — F329 Major depressive disorder, single episode, unspecified: Secondary | ICD-10-CM | POA: Diagnosis not present

## 2015-02-04 DIAGNOSIS — I10 Essential (primary) hypertension: Secondary | ICD-10-CM | POA: Diagnosis not present

## 2015-02-04 DIAGNOSIS — T86828 Other complications of skin graft (allograft) (autograft): Secondary | ICD-10-CM | POA: Diagnosis not present

## 2015-02-04 DIAGNOSIS — L97521 Non-pressure chronic ulcer of other part of left foot limited to breakdown of skin: Secondary | ICD-10-CM | POA: Diagnosis not present

## 2015-02-09 DIAGNOSIS — Z131 Encounter for screening for diabetes mellitus: Secondary | ICD-10-CM | POA: Diagnosis not present

## 2015-02-12 DIAGNOSIS — I872 Venous insufficiency (chronic) (peripheral): Secondary | ICD-10-CM | POA: Diagnosis not present

## 2015-02-12 DIAGNOSIS — L97521 Non-pressure chronic ulcer of other part of left foot limited to breakdown of skin: Secondary | ICD-10-CM | POA: Diagnosis not present

## 2015-02-12 DIAGNOSIS — F329 Major depressive disorder, single episode, unspecified: Secondary | ICD-10-CM | POA: Diagnosis not present

## 2015-02-12 DIAGNOSIS — F419 Anxiety disorder, unspecified: Secondary | ICD-10-CM | POA: Diagnosis not present

## 2015-02-12 DIAGNOSIS — I10 Essential (primary) hypertension: Secondary | ICD-10-CM | POA: Diagnosis not present

## 2015-02-12 DIAGNOSIS — T86828 Other complications of skin graft (allograft) (autograft): Secondary | ICD-10-CM | POA: Diagnosis not present

## 2015-02-17 DIAGNOSIS — I10 Essential (primary) hypertension: Secondary | ICD-10-CM | POA: Diagnosis not present

## 2015-02-17 DIAGNOSIS — T86828 Other complications of skin graft (allograft) (autograft): Secondary | ICD-10-CM | POA: Diagnosis not present

## 2015-02-17 DIAGNOSIS — L97521 Non-pressure chronic ulcer of other part of left foot limited to breakdown of skin: Secondary | ICD-10-CM | POA: Diagnosis not present

## 2015-02-17 DIAGNOSIS — F329 Major depressive disorder, single episode, unspecified: Secondary | ICD-10-CM | POA: Diagnosis not present

## 2015-02-17 DIAGNOSIS — I872 Venous insufficiency (chronic) (peripheral): Secondary | ICD-10-CM | POA: Diagnosis not present

## 2015-02-17 DIAGNOSIS — F419 Anxiety disorder, unspecified: Secondary | ICD-10-CM | POA: Diagnosis not present

## 2015-02-18 DIAGNOSIS — T86828 Other complications of skin graft (allograft) (autograft): Secondary | ICD-10-CM | POA: Diagnosis not present

## 2015-02-18 DIAGNOSIS — I872 Venous insufficiency (chronic) (peripheral): Secondary | ICD-10-CM | POA: Diagnosis not present

## 2015-02-18 DIAGNOSIS — I10 Essential (primary) hypertension: Secondary | ICD-10-CM | POA: Diagnosis not present

## 2015-02-18 DIAGNOSIS — F329 Major depressive disorder, single episode, unspecified: Secondary | ICD-10-CM | POA: Diagnosis not present

## 2015-02-18 DIAGNOSIS — F419 Anxiety disorder, unspecified: Secondary | ICD-10-CM | POA: Diagnosis not present

## 2015-02-18 DIAGNOSIS — L97521 Non-pressure chronic ulcer of other part of left foot limited to breakdown of skin: Secondary | ICD-10-CM | POA: Diagnosis not present

## 2015-02-22 DIAGNOSIS — M19031 Primary osteoarthritis, right wrist: Secondary | ICD-10-CM | POA: Diagnosis not present

## 2015-02-22 DIAGNOSIS — I10 Essential (primary) hypertension: Secondary | ICD-10-CM | POA: Diagnosis not present

## 2015-02-27 DIAGNOSIS — F419 Anxiety disorder, unspecified: Secondary | ICD-10-CM | POA: Diagnosis not present

## 2015-02-27 DIAGNOSIS — I872 Venous insufficiency (chronic) (peripheral): Secondary | ICD-10-CM | POA: Diagnosis not present

## 2015-02-27 DIAGNOSIS — F329 Major depressive disorder, single episode, unspecified: Secondary | ICD-10-CM | POA: Diagnosis not present

## 2015-02-27 DIAGNOSIS — I10 Essential (primary) hypertension: Secondary | ICD-10-CM | POA: Diagnosis not present

## 2015-02-27 DIAGNOSIS — T86828 Other complications of skin graft (allograft) (autograft): Secondary | ICD-10-CM | POA: Diagnosis not present

## 2015-02-27 DIAGNOSIS — L97521 Non-pressure chronic ulcer of other part of left foot limited to breakdown of skin: Secondary | ICD-10-CM | POA: Diagnosis not present

## 2015-03-03 DIAGNOSIS — F329 Major depressive disorder, single episode, unspecified: Secondary | ICD-10-CM | POA: Diagnosis not present

## 2015-03-03 DIAGNOSIS — I10 Essential (primary) hypertension: Secondary | ICD-10-CM | POA: Diagnosis not present

## 2015-03-03 DIAGNOSIS — F419 Anxiety disorder, unspecified: Secondary | ICD-10-CM | POA: Diagnosis not present

## 2015-03-03 DIAGNOSIS — I83223 Varicose veins of left lower extremity with both ulcer of ankle and inflammation: Secondary | ICD-10-CM | POA: Diagnosis not present

## 2015-03-03 DIAGNOSIS — I872 Venous insufficiency (chronic) (peripheral): Secondary | ICD-10-CM | POA: Diagnosis not present

## 2015-03-03 DIAGNOSIS — L97322 Non-pressure chronic ulcer of left ankle with fat layer exposed: Secondary | ICD-10-CM | POA: Diagnosis not present

## 2015-03-03 DIAGNOSIS — L97521 Non-pressure chronic ulcer of other part of left foot limited to breakdown of skin: Secondary | ICD-10-CM | POA: Diagnosis not present

## 2015-03-03 DIAGNOSIS — L97329 Non-pressure chronic ulcer of left ankle with unspecified severity: Secondary | ICD-10-CM | POA: Diagnosis not present

## 2015-03-03 DIAGNOSIS — T86828 Other complications of skin graft (allograft) (autograft): Secondary | ICD-10-CM | POA: Diagnosis not present

## 2015-03-09 DIAGNOSIS — H2512 Age-related nuclear cataract, left eye: Secondary | ICD-10-CM | POA: Diagnosis not present

## 2015-03-09 DIAGNOSIS — H5231 Anisometropia: Secondary | ICD-10-CM | POA: Diagnosis not present

## 2015-03-09 DIAGNOSIS — Z961 Presence of intraocular lens: Secondary | ICD-10-CM | POA: Diagnosis not present

## 2015-03-11 DIAGNOSIS — I10 Essential (primary) hypertension: Secondary | ICD-10-CM | POA: Diagnosis not present

## 2015-03-11 DIAGNOSIS — F329 Major depressive disorder, single episode, unspecified: Secondary | ICD-10-CM | POA: Diagnosis not present

## 2015-03-11 DIAGNOSIS — I872 Venous insufficiency (chronic) (peripheral): Secondary | ICD-10-CM | POA: Diagnosis not present

## 2015-03-11 DIAGNOSIS — T86828 Other complications of skin graft (allograft) (autograft): Secondary | ICD-10-CM | POA: Diagnosis not present

## 2015-03-11 DIAGNOSIS — L97521 Non-pressure chronic ulcer of other part of left foot limited to breakdown of skin: Secondary | ICD-10-CM | POA: Diagnosis not present

## 2015-03-11 DIAGNOSIS — F419 Anxiety disorder, unspecified: Secondary | ICD-10-CM | POA: Diagnosis not present

## 2015-03-18 DIAGNOSIS — F329 Major depressive disorder, single episode, unspecified: Secondary | ICD-10-CM | POA: Diagnosis not present

## 2015-03-18 DIAGNOSIS — T86828 Other complications of skin graft (allograft) (autograft): Secondary | ICD-10-CM | POA: Diagnosis not present

## 2015-03-18 DIAGNOSIS — F419 Anxiety disorder, unspecified: Secondary | ICD-10-CM | POA: Diagnosis not present

## 2015-03-18 DIAGNOSIS — I10 Essential (primary) hypertension: Secondary | ICD-10-CM | POA: Diagnosis not present

## 2015-03-18 DIAGNOSIS — L97521 Non-pressure chronic ulcer of other part of left foot limited to breakdown of skin: Secondary | ICD-10-CM | POA: Diagnosis not present

## 2015-03-18 DIAGNOSIS — I872 Venous insufficiency (chronic) (peripheral): Secondary | ICD-10-CM | POA: Diagnosis not present

## 2015-03-25 DIAGNOSIS — T86828 Other complications of skin graft (allograft) (autograft): Secondary | ICD-10-CM | POA: Diagnosis not present

## 2015-03-25 DIAGNOSIS — F329 Major depressive disorder, single episode, unspecified: Secondary | ICD-10-CM | POA: Diagnosis not present

## 2015-03-25 DIAGNOSIS — I10 Essential (primary) hypertension: Secondary | ICD-10-CM | POA: Diagnosis not present

## 2015-03-25 DIAGNOSIS — L97521 Non-pressure chronic ulcer of other part of left foot limited to breakdown of skin: Secondary | ICD-10-CM | POA: Diagnosis not present

## 2015-03-25 DIAGNOSIS — I872 Venous insufficiency (chronic) (peripheral): Secondary | ICD-10-CM | POA: Diagnosis not present

## 2015-03-25 DIAGNOSIS — F419 Anxiety disorder, unspecified: Secondary | ICD-10-CM | POA: Diagnosis not present

## 2015-03-29 DIAGNOSIS — H2512 Age-related nuclear cataract, left eye: Secondary | ICD-10-CM | POA: Diagnosis not present

## 2015-04-01 DIAGNOSIS — F329 Major depressive disorder, single episode, unspecified: Secondary | ICD-10-CM | POA: Diagnosis not present

## 2015-04-01 DIAGNOSIS — I872 Venous insufficiency (chronic) (peripheral): Secondary | ICD-10-CM | POA: Diagnosis not present

## 2015-04-01 DIAGNOSIS — T86828 Other complications of skin graft (allograft) (autograft): Secondary | ICD-10-CM | POA: Diagnosis not present

## 2015-04-01 DIAGNOSIS — L97521 Non-pressure chronic ulcer of other part of left foot limited to breakdown of skin: Secondary | ICD-10-CM | POA: Diagnosis not present

## 2015-04-01 DIAGNOSIS — F419 Anxiety disorder, unspecified: Secondary | ICD-10-CM | POA: Diagnosis not present

## 2015-04-01 DIAGNOSIS — I10 Essential (primary) hypertension: Secondary | ICD-10-CM | POA: Diagnosis not present

## 2015-04-08 DIAGNOSIS — I872 Venous insufficiency (chronic) (peripheral): Secondary | ICD-10-CM | POA: Diagnosis not present

## 2015-04-08 DIAGNOSIS — L97521 Non-pressure chronic ulcer of other part of left foot limited to breakdown of skin: Secondary | ICD-10-CM | POA: Diagnosis not present

## 2015-04-08 DIAGNOSIS — F419 Anxiety disorder, unspecified: Secondary | ICD-10-CM | POA: Diagnosis not present

## 2015-04-08 DIAGNOSIS — F329 Major depressive disorder, single episode, unspecified: Secondary | ICD-10-CM | POA: Diagnosis not present

## 2015-04-08 DIAGNOSIS — T86828 Other complications of skin graft (allograft) (autograft): Secondary | ICD-10-CM | POA: Diagnosis not present

## 2015-04-08 DIAGNOSIS — I10 Essential (primary) hypertension: Secondary | ICD-10-CM | POA: Diagnosis not present

## 2015-04-09 DIAGNOSIS — I10 Essential (primary) hypertension: Secondary | ICD-10-CM | POA: Diagnosis not present

## 2015-04-09 DIAGNOSIS — Z880 Allergy status to penicillin: Secondary | ICD-10-CM | POA: Diagnosis not present

## 2015-04-09 DIAGNOSIS — H2512 Age-related nuclear cataract, left eye: Secondary | ICD-10-CM | POA: Diagnosis not present

## 2015-04-09 DIAGNOSIS — J069 Acute upper respiratory infection, unspecified: Secondary | ICD-10-CM | POA: Diagnosis not present

## 2015-04-09 DIAGNOSIS — Z886 Allergy status to analgesic agent status: Secondary | ICD-10-CM | POA: Diagnosis not present

## 2015-04-09 DIAGNOSIS — Z539 Procedure and treatment not carried out, unspecified reason: Secondary | ICD-10-CM | POA: Diagnosis not present

## 2015-04-09 DIAGNOSIS — Z9841 Cataract extraction status, right eye: Secondary | ICD-10-CM | POA: Diagnosis not present

## 2015-04-09 DIAGNOSIS — F419 Anxiety disorder, unspecified: Secondary | ICD-10-CM | POA: Diagnosis not present

## 2015-04-09 DIAGNOSIS — Z961 Presence of intraocular lens: Secondary | ICD-10-CM | POA: Diagnosis not present

## 2015-04-09 DIAGNOSIS — Z888 Allergy status to other drugs, medicaments and biological substances status: Secondary | ICD-10-CM | POA: Diagnosis not present

## 2015-04-09 DIAGNOSIS — M1990 Unspecified osteoarthritis, unspecified site: Secondary | ICD-10-CM | POA: Diagnosis not present

## 2015-04-13 DIAGNOSIS — R0602 Shortness of breath: Secondary | ICD-10-CM | POA: Diagnosis not present

## 2015-04-13 DIAGNOSIS — J209 Acute bronchitis, unspecified: Secondary | ICD-10-CM | POA: Diagnosis not present

## 2015-04-13 DIAGNOSIS — Z79899 Other long term (current) drug therapy: Secondary | ICD-10-CM | POA: Diagnosis not present

## 2015-04-13 DIAGNOSIS — R0789 Other chest pain: Secondary | ICD-10-CM | POA: Diagnosis not present

## 2015-04-13 DIAGNOSIS — R05 Cough: Secondary | ICD-10-CM | POA: Diagnosis not present

## 2015-04-13 DIAGNOSIS — I10 Essential (primary) hypertension: Secondary | ICD-10-CM | POA: Diagnosis not present

## 2015-04-13 DIAGNOSIS — R062 Wheezing: Secondary | ICD-10-CM | POA: Diagnosis not present

## 2015-04-15 DIAGNOSIS — F329 Major depressive disorder, single episode, unspecified: Secondary | ICD-10-CM | POA: Diagnosis not present

## 2015-04-15 DIAGNOSIS — T86828 Other complications of skin graft (allograft) (autograft): Secondary | ICD-10-CM | POA: Diagnosis not present

## 2015-04-15 DIAGNOSIS — I872 Venous insufficiency (chronic) (peripheral): Secondary | ICD-10-CM | POA: Diagnosis not present

## 2015-04-15 DIAGNOSIS — F419 Anxiety disorder, unspecified: Secondary | ICD-10-CM | POA: Diagnosis not present

## 2015-04-15 DIAGNOSIS — L97521 Non-pressure chronic ulcer of other part of left foot limited to breakdown of skin: Secondary | ICD-10-CM | POA: Diagnosis not present

## 2015-04-15 DIAGNOSIS — I10 Essential (primary) hypertension: Secondary | ICD-10-CM | POA: Diagnosis not present

## 2015-04-18 DIAGNOSIS — I872 Venous insufficiency (chronic) (peripheral): Secondary | ICD-10-CM | POA: Diagnosis not present

## 2015-04-18 DIAGNOSIS — F419 Anxiety disorder, unspecified: Secondary | ICD-10-CM | POA: Diagnosis not present

## 2015-04-18 DIAGNOSIS — T86828 Other complications of skin graft (allograft) (autograft): Secondary | ICD-10-CM | POA: Diagnosis not present

## 2015-04-18 DIAGNOSIS — L97521 Non-pressure chronic ulcer of other part of left foot limited to breakdown of skin: Secondary | ICD-10-CM | POA: Diagnosis not present

## 2015-04-18 DIAGNOSIS — F329 Major depressive disorder, single episode, unspecified: Secondary | ICD-10-CM | POA: Diagnosis not present

## 2015-04-18 DIAGNOSIS — I10 Essential (primary) hypertension: Secondary | ICD-10-CM | POA: Diagnosis not present

## 2015-04-20 DIAGNOSIS — M7742 Metatarsalgia, left foot: Secondary | ICD-10-CM | POA: Diagnosis not present

## 2015-04-20 DIAGNOSIS — L602 Onychogryphosis: Secondary | ICD-10-CM | POA: Diagnosis not present

## 2015-04-20 DIAGNOSIS — M2041 Other hammer toe(s) (acquired), right foot: Secondary | ICD-10-CM | POA: Diagnosis not present

## 2015-04-20 DIAGNOSIS — I739 Peripheral vascular disease, unspecified: Secondary | ICD-10-CM | POA: Diagnosis not present

## 2015-04-20 DIAGNOSIS — M79675 Pain in left toe(s): Secondary | ICD-10-CM | POA: Diagnosis not present

## 2015-04-20 DIAGNOSIS — L84 Corns and callosities: Secondary | ICD-10-CM | POA: Diagnosis not present

## 2015-04-20 DIAGNOSIS — B351 Tinea unguium: Secondary | ICD-10-CM | POA: Diagnosis not present

## 2015-04-20 DIAGNOSIS — M79674 Pain in right toe(s): Secondary | ICD-10-CM | POA: Diagnosis not present

## 2015-04-21 DIAGNOSIS — M19031 Primary osteoarthritis, right wrist: Secondary | ICD-10-CM | POA: Diagnosis not present

## 2015-04-21 DIAGNOSIS — I10 Essential (primary) hypertension: Secondary | ICD-10-CM | POA: Diagnosis not present

## 2015-04-21 DIAGNOSIS — J3089 Other allergic rhinitis: Secondary | ICD-10-CM | POA: Diagnosis not present

## 2015-04-22 DIAGNOSIS — F329 Major depressive disorder, single episode, unspecified: Secondary | ICD-10-CM | POA: Diagnosis not present

## 2015-04-22 DIAGNOSIS — R0602 Shortness of breath: Secondary | ICD-10-CM | POA: Diagnosis not present

## 2015-04-22 DIAGNOSIS — T86828 Other complications of skin graft (allograft) (autograft): Secondary | ICD-10-CM | POA: Diagnosis not present

## 2015-04-22 DIAGNOSIS — L97521 Non-pressure chronic ulcer of other part of left foot limited to breakdown of skin: Secondary | ICD-10-CM | POA: Diagnosis not present

## 2015-04-22 DIAGNOSIS — F419 Anxiety disorder, unspecified: Secondary | ICD-10-CM | POA: Diagnosis not present

## 2015-04-22 DIAGNOSIS — I10 Essential (primary) hypertension: Secondary | ICD-10-CM | POA: Diagnosis not present

## 2015-04-22 DIAGNOSIS — Z131 Encounter for screening for diabetes mellitus: Secondary | ICD-10-CM | POA: Diagnosis not present

## 2015-04-22 DIAGNOSIS — I872 Venous insufficiency (chronic) (peripheral): Secondary | ICD-10-CM | POA: Diagnosis not present

## 2015-04-28 DIAGNOSIS — I83223 Varicose veins of left lower extremity with both ulcer of ankle and inflammation: Secondary | ICD-10-CM | POA: Diagnosis not present

## 2015-04-28 DIAGNOSIS — L97322 Non-pressure chronic ulcer of left ankle with fat layer exposed: Secondary | ICD-10-CM | POA: Diagnosis not present

## 2015-04-28 DIAGNOSIS — L97329 Non-pressure chronic ulcer of left ankle with unspecified severity: Secondary | ICD-10-CM | POA: Diagnosis not present

## 2015-04-28 DIAGNOSIS — I872 Venous insufficiency (chronic) (peripheral): Secondary | ICD-10-CM | POA: Diagnosis not present

## 2015-04-29 DIAGNOSIS — F329 Major depressive disorder, single episode, unspecified: Secondary | ICD-10-CM | POA: Diagnosis not present

## 2015-04-29 DIAGNOSIS — L97521 Non-pressure chronic ulcer of other part of left foot limited to breakdown of skin: Secondary | ICD-10-CM | POA: Diagnosis not present

## 2015-04-29 DIAGNOSIS — F419 Anxiety disorder, unspecified: Secondary | ICD-10-CM | POA: Diagnosis not present

## 2015-04-29 DIAGNOSIS — I10 Essential (primary) hypertension: Secondary | ICD-10-CM | POA: Diagnosis not present

## 2015-04-29 DIAGNOSIS — I872 Venous insufficiency (chronic) (peripheral): Secondary | ICD-10-CM | POA: Diagnosis not present

## 2015-04-29 DIAGNOSIS — T86828 Other complications of skin graft (allograft) (autograft): Secondary | ICD-10-CM | POA: Diagnosis not present

## 2015-05-06 DIAGNOSIS — T86828 Other complications of skin graft (allograft) (autograft): Secondary | ICD-10-CM | POA: Diagnosis not present

## 2015-05-06 DIAGNOSIS — F329 Major depressive disorder, single episode, unspecified: Secondary | ICD-10-CM | POA: Diagnosis not present

## 2015-05-06 DIAGNOSIS — F419 Anxiety disorder, unspecified: Secondary | ICD-10-CM | POA: Diagnosis not present

## 2015-05-06 DIAGNOSIS — I872 Venous insufficiency (chronic) (peripheral): Secondary | ICD-10-CM | POA: Diagnosis not present

## 2015-05-06 DIAGNOSIS — I10 Essential (primary) hypertension: Secondary | ICD-10-CM | POA: Diagnosis not present

## 2015-05-06 DIAGNOSIS — L97521 Non-pressure chronic ulcer of other part of left foot limited to breakdown of skin: Secondary | ICD-10-CM | POA: Diagnosis not present

## 2015-05-13 DIAGNOSIS — T86828 Other complications of skin graft (allograft) (autograft): Secondary | ICD-10-CM | POA: Diagnosis not present

## 2015-05-13 DIAGNOSIS — I10 Essential (primary) hypertension: Secondary | ICD-10-CM | POA: Diagnosis not present

## 2015-05-13 DIAGNOSIS — F419 Anxiety disorder, unspecified: Secondary | ICD-10-CM | POA: Diagnosis not present

## 2015-05-13 DIAGNOSIS — L97521 Non-pressure chronic ulcer of other part of left foot limited to breakdown of skin: Secondary | ICD-10-CM | POA: Diagnosis not present

## 2015-05-13 DIAGNOSIS — F329 Major depressive disorder, single episode, unspecified: Secondary | ICD-10-CM | POA: Diagnosis not present

## 2015-05-13 DIAGNOSIS — I872 Venous insufficiency (chronic) (peripheral): Secondary | ICD-10-CM | POA: Diagnosis not present

## 2015-05-20 DIAGNOSIS — F329 Major depressive disorder, single episode, unspecified: Secondary | ICD-10-CM | POA: Diagnosis not present

## 2015-05-20 DIAGNOSIS — I872 Venous insufficiency (chronic) (peripheral): Secondary | ICD-10-CM | POA: Diagnosis not present

## 2015-05-20 DIAGNOSIS — F419 Anxiety disorder, unspecified: Secondary | ICD-10-CM | POA: Diagnosis not present

## 2015-05-20 DIAGNOSIS — T86828 Other complications of skin graft (allograft) (autograft): Secondary | ICD-10-CM | POA: Diagnosis not present

## 2015-05-20 DIAGNOSIS — I10 Essential (primary) hypertension: Secondary | ICD-10-CM | POA: Diagnosis not present

## 2015-05-20 DIAGNOSIS — L97521 Non-pressure chronic ulcer of other part of left foot limited to breakdown of skin: Secondary | ICD-10-CM | POA: Diagnosis not present

## 2015-05-25 DIAGNOSIS — M170 Bilateral primary osteoarthritis of knee: Secondary | ICD-10-CM | POA: Diagnosis not present

## 2015-05-25 DIAGNOSIS — I10 Essential (primary) hypertension: Secondary | ICD-10-CM | POA: Diagnosis not present

## 2015-05-27 DIAGNOSIS — F419 Anxiety disorder, unspecified: Secondary | ICD-10-CM | POA: Diagnosis not present

## 2015-05-27 DIAGNOSIS — I872 Venous insufficiency (chronic) (peripheral): Secondary | ICD-10-CM | POA: Diagnosis not present

## 2015-05-27 DIAGNOSIS — L97521 Non-pressure chronic ulcer of other part of left foot limited to breakdown of skin: Secondary | ICD-10-CM | POA: Diagnosis not present

## 2015-05-27 DIAGNOSIS — T86828 Other complications of skin graft (allograft) (autograft): Secondary | ICD-10-CM | POA: Diagnosis not present

## 2015-05-27 DIAGNOSIS — I10 Essential (primary) hypertension: Secondary | ICD-10-CM | POA: Diagnosis not present

## 2015-05-27 DIAGNOSIS — F329 Major depressive disorder, single episode, unspecified: Secondary | ICD-10-CM | POA: Diagnosis not present

## 2015-05-31 DIAGNOSIS — H2512 Age-related nuclear cataract, left eye: Secondary | ICD-10-CM | POA: Diagnosis not present

## 2015-06-03 DIAGNOSIS — L97521 Non-pressure chronic ulcer of other part of left foot limited to breakdown of skin: Secondary | ICD-10-CM | POA: Diagnosis not present

## 2015-06-03 DIAGNOSIS — F419 Anxiety disorder, unspecified: Secondary | ICD-10-CM | POA: Diagnosis not present

## 2015-06-03 DIAGNOSIS — I10 Essential (primary) hypertension: Secondary | ICD-10-CM | POA: Diagnosis not present

## 2015-06-03 DIAGNOSIS — F329 Major depressive disorder, single episode, unspecified: Secondary | ICD-10-CM | POA: Diagnosis not present

## 2015-06-03 DIAGNOSIS — T86828 Other complications of skin graft (allograft) (autograft): Secondary | ICD-10-CM | POA: Diagnosis not present

## 2015-06-03 DIAGNOSIS — I872 Venous insufficiency (chronic) (peripheral): Secondary | ICD-10-CM | POA: Diagnosis not present

## 2015-06-10 DIAGNOSIS — T86828 Other complications of skin graft (allograft) (autograft): Secondary | ICD-10-CM | POA: Diagnosis not present

## 2015-06-10 DIAGNOSIS — I872 Venous insufficiency (chronic) (peripheral): Secondary | ICD-10-CM | POA: Diagnosis not present

## 2015-06-10 DIAGNOSIS — I10 Essential (primary) hypertension: Secondary | ICD-10-CM | POA: Diagnosis not present

## 2015-06-10 DIAGNOSIS — F419 Anxiety disorder, unspecified: Secondary | ICD-10-CM | POA: Diagnosis not present

## 2015-06-10 DIAGNOSIS — L97521 Non-pressure chronic ulcer of other part of left foot limited to breakdown of skin: Secondary | ICD-10-CM | POA: Diagnosis not present

## 2015-06-10 DIAGNOSIS — F329 Major depressive disorder, single episode, unspecified: Secondary | ICD-10-CM | POA: Diagnosis not present

## 2015-06-11 DIAGNOSIS — F419 Anxiety disorder, unspecified: Secondary | ICD-10-CM | POA: Diagnosis not present

## 2015-06-11 DIAGNOSIS — H259 Unspecified age-related cataract: Secondary | ICD-10-CM | POA: Diagnosis not present

## 2015-06-11 DIAGNOSIS — H2512 Age-related nuclear cataract, left eye: Secondary | ICD-10-CM | POA: Diagnosis not present

## 2015-06-11 DIAGNOSIS — Z961 Presence of intraocular lens: Secondary | ICD-10-CM | POA: Diagnosis not present

## 2015-06-11 DIAGNOSIS — Z880 Allergy status to penicillin: Secondary | ICD-10-CM | POA: Diagnosis not present

## 2015-06-11 DIAGNOSIS — Z888 Allergy status to other drugs, medicaments and biological substances status: Secondary | ICD-10-CM | POA: Diagnosis not present

## 2015-06-11 DIAGNOSIS — M1990 Unspecified osteoarthritis, unspecified site: Secondary | ICD-10-CM | POA: Diagnosis not present

## 2015-06-11 DIAGNOSIS — Z87891 Personal history of nicotine dependence: Secondary | ICD-10-CM | POA: Diagnosis not present

## 2015-06-11 DIAGNOSIS — I10 Essential (primary) hypertension: Secondary | ICD-10-CM | POA: Diagnosis not present

## 2015-06-11 DIAGNOSIS — Z9841 Cataract extraction status, right eye: Secondary | ICD-10-CM | POA: Diagnosis not present

## 2015-06-17 DIAGNOSIS — F419 Anxiety disorder, unspecified: Secondary | ICD-10-CM | POA: Diagnosis not present

## 2015-06-17 DIAGNOSIS — H269 Unspecified cataract: Secondary | ICD-10-CM | POA: Diagnosis not present

## 2015-06-17 DIAGNOSIS — F329 Major depressive disorder, single episode, unspecified: Secondary | ICD-10-CM | POA: Diagnosis not present

## 2015-06-17 DIAGNOSIS — I10 Essential (primary) hypertension: Secondary | ICD-10-CM | POA: Diagnosis not present

## 2015-06-17 DIAGNOSIS — M1991 Primary osteoarthritis, unspecified site: Secondary | ICD-10-CM | POA: Diagnosis not present

## 2015-06-17 DIAGNOSIS — T86828 Other complications of skin graft (allograft) (autograft): Secondary | ICD-10-CM | POA: Diagnosis not present

## 2015-06-24 DIAGNOSIS — T86828 Other complications of skin graft (allograft) (autograft): Secondary | ICD-10-CM | POA: Diagnosis not present

## 2015-06-24 DIAGNOSIS — H269 Unspecified cataract: Secondary | ICD-10-CM | POA: Diagnosis not present

## 2015-06-24 DIAGNOSIS — I10 Essential (primary) hypertension: Secondary | ICD-10-CM | POA: Diagnosis not present

## 2015-06-24 DIAGNOSIS — F419 Anxiety disorder, unspecified: Secondary | ICD-10-CM | POA: Diagnosis not present

## 2015-06-24 DIAGNOSIS — F329 Major depressive disorder, single episode, unspecified: Secondary | ICD-10-CM | POA: Diagnosis not present

## 2015-06-24 DIAGNOSIS — M1991 Primary osteoarthritis, unspecified site: Secondary | ICD-10-CM | POA: Diagnosis not present

## 2015-06-30 DIAGNOSIS — L97529 Non-pressure chronic ulcer of other part of left foot with unspecified severity: Secondary | ICD-10-CM | POA: Diagnosis not present

## 2015-06-30 DIAGNOSIS — L97329 Non-pressure chronic ulcer of left ankle with unspecified severity: Secondary | ICD-10-CM | POA: Diagnosis not present

## 2015-06-30 DIAGNOSIS — I83223 Varicose veins of left lower extremity with both ulcer of ankle and inflammation: Secondary | ICD-10-CM | POA: Diagnosis not present

## 2015-06-30 DIAGNOSIS — L97322 Non-pressure chronic ulcer of left ankle with fat layer exposed: Secondary | ICD-10-CM | POA: Diagnosis not present

## 2015-07-09 DIAGNOSIS — T86828 Other complications of skin graft (allograft) (autograft): Secondary | ICD-10-CM | POA: Diagnosis not present

## 2015-07-09 DIAGNOSIS — I10 Essential (primary) hypertension: Secondary | ICD-10-CM | POA: Diagnosis not present

## 2015-07-09 DIAGNOSIS — H269 Unspecified cataract: Secondary | ICD-10-CM | POA: Diagnosis not present

## 2015-07-09 DIAGNOSIS — M1991 Primary osteoarthritis, unspecified site: Secondary | ICD-10-CM | POA: Diagnosis not present

## 2015-07-09 DIAGNOSIS — M150 Primary generalized (osteo)arthritis: Secondary | ICD-10-CM | POA: Diagnosis not present

## 2015-07-09 DIAGNOSIS — F419 Anxiety disorder, unspecified: Secondary | ICD-10-CM | POA: Diagnosis not present

## 2015-07-09 DIAGNOSIS — F329 Major depressive disorder, single episode, unspecified: Secondary | ICD-10-CM | POA: Diagnosis not present

## 2015-07-15 DIAGNOSIS — T86828 Other complications of skin graft (allograft) (autograft): Secondary | ICD-10-CM | POA: Diagnosis not present

## 2015-07-15 DIAGNOSIS — I10 Essential (primary) hypertension: Secondary | ICD-10-CM | POA: Diagnosis not present

## 2015-07-15 DIAGNOSIS — H269 Unspecified cataract: Secondary | ICD-10-CM | POA: Diagnosis not present

## 2015-07-15 DIAGNOSIS — F419 Anxiety disorder, unspecified: Secondary | ICD-10-CM | POA: Diagnosis not present

## 2015-07-15 DIAGNOSIS — M1991 Primary osteoarthritis, unspecified site: Secondary | ICD-10-CM | POA: Diagnosis not present

## 2015-07-15 DIAGNOSIS — F329 Major depressive disorder, single episode, unspecified: Secondary | ICD-10-CM | POA: Diagnosis not present

## 2015-07-20 DIAGNOSIS — L600 Ingrowing nail: Secondary | ICD-10-CM | POA: Diagnosis not present

## 2015-07-20 DIAGNOSIS — M79674 Pain in right toe(s): Secondary | ICD-10-CM | POA: Diagnosis not present

## 2015-07-20 DIAGNOSIS — L84 Corns and callosities: Secondary | ICD-10-CM | POA: Diagnosis not present

## 2015-07-20 DIAGNOSIS — M79675 Pain in left toe(s): Secondary | ICD-10-CM | POA: Diagnosis not present

## 2015-07-20 DIAGNOSIS — I739 Peripheral vascular disease, unspecified: Secondary | ICD-10-CM | POA: Diagnosis not present

## 2015-07-20 DIAGNOSIS — L602 Onychogryphosis: Secondary | ICD-10-CM | POA: Diagnosis not present

## 2015-07-22 DIAGNOSIS — H269 Unspecified cataract: Secondary | ICD-10-CM | POA: Diagnosis not present

## 2015-07-22 DIAGNOSIS — T86828 Other complications of skin graft (allograft) (autograft): Secondary | ICD-10-CM | POA: Diagnosis not present

## 2015-07-22 DIAGNOSIS — I10 Essential (primary) hypertension: Secondary | ICD-10-CM | POA: Diagnosis not present

## 2015-07-22 DIAGNOSIS — F419 Anxiety disorder, unspecified: Secondary | ICD-10-CM | POA: Diagnosis not present

## 2015-07-22 DIAGNOSIS — F329 Major depressive disorder, single episode, unspecified: Secondary | ICD-10-CM | POA: Diagnosis not present

## 2015-07-22 DIAGNOSIS — M1991 Primary osteoarthritis, unspecified site: Secondary | ICD-10-CM | POA: Diagnosis not present

## 2015-07-29 DIAGNOSIS — F419 Anxiety disorder, unspecified: Secondary | ICD-10-CM | POA: Diagnosis not present

## 2015-07-29 DIAGNOSIS — T86828 Other complications of skin graft (allograft) (autograft): Secondary | ICD-10-CM | POA: Diagnosis not present

## 2015-07-29 DIAGNOSIS — I10 Essential (primary) hypertension: Secondary | ICD-10-CM | POA: Diagnosis not present

## 2015-07-29 DIAGNOSIS — M1991 Primary osteoarthritis, unspecified site: Secondary | ICD-10-CM | POA: Diagnosis not present

## 2015-07-29 DIAGNOSIS — F329 Major depressive disorder, single episode, unspecified: Secondary | ICD-10-CM | POA: Diagnosis not present

## 2015-07-29 DIAGNOSIS — H269 Unspecified cataract: Secondary | ICD-10-CM | POA: Diagnosis not present

## 2015-08-05 DIAGNOSIS — M1991 Primary osteoarthritis, unspecified site: Secondary | ICD-10-CM | POA: Diagnosis not present

## 2015-08-05 DIAGNOSIS — F329 Major depressive disorder, single episode, unspecified: Secondary | ICD-10-CM | POA: Diagnosis not present

## 2015-08-05 DIAGNOSIS — I10 Essential (primary) hypertension: Secondary | ICD-10-CM | POA: Diagnosis not present

## 2015-08-05 DIAGNOSIS — H269 Unspecified cataract: Secondary | ICD-10-CM | POA: Diagnosis not present

## 2015-08-05 DIAGNOSIS — T86828 Other complications of skin graft (allograft) (autograft): Secondary | ICD-10-CM | POA: Diagnosis not present

## 2015-08-05 DIAGNOSIS — F419 Anxiety disorder, unspecified: Secondary | ICD-10-CM | POA: Diagnosis not present

## 2015-08-06 DIAGNOSIS — Z1231 Encounter for screening mammogram for malignant neoplasm of breast: Secondary | ICD-10-CM | POA: Diagnosis not present

## 2015-08-09 DIAGNOSIS — M79604 Pain in right leg: Secondary | ICD-10-CM | POA: Diagnosis not present

## 2015-08-09 DIAGNOSIS — M79605 Pain in left leg: Secondary | ICD-10-CM | POA: Diagnosis not present

## 2015-08-09 DIAGNOSIS — M170 Bilateral primary osteoarthritis of knee: Secondary | ICD-10-CM | POA: Diagnosis not present

## 2015-08-10 DIAGNOSIS — I10 Essential (primary) hypertension: Secondary | ICD-10-CM | POA: Diagnosis not present

## 2015-08-10 DIAGNOSIS — T86828 Other complications of skin graft (allograft) (autograft): Secondary | ICD-10-CM | POA: Diagnosis not present

## 2015-08-10 DIAGNOSIS — H269 Unspecified cataract: Secondary | ICD-10-CM | POA: Diagnosis not present

## 2015-08-10 DIAGNOSIS — F419 Anxiety disorder, unspecified: Secondary | ICD-10-CM | POA: Diagnosis not present

## 2015-08-10 DIAGNOSIS — M1991 Primary osteoarthritis, unspecified site: Secondary | ICD-10-CM | POA: Diagnosis not present

## 2015-08-10 DIAGNOSIS — F329 Major depressive disorder, single episode, unspecified: Secondary | ICD-10-CM | POA: Diagnosis not present

## 2015-08-12 DIAGNOSIS — F329 Major depressive disorder, single episode, unspecified: Secondary | ICD-10-CM | POA: Diagnosis not present

## 2015-08-12 DIAGNOSIS — I10 Essential (primary) hypertension: Secondary | ICD-10-CM | POA: Diagnosis not present

## 2015-08-12 DIAGNOSIS — F419 Anxiety disorder, unspecified: Secondary | ICD-10-CM | POA: Diagnosis not present

## 2015-08-12 DIAGNOSIS — T86828 Other complications of skin graft (allograft) (autograft): Secondary | ICD-10-CM | POA: Diagnosis not present

## 2015-08-12 DIAGNOSIS — M1991 Primary osteoarthritis, unspecified site: Secondary | ICD-10-CM | POA: Diagnosis not present

## 2015-08-12 DIAGNOSIS — H269 Unspecified cataract: Secondary | ICD-10-CM | POA: Diagnosis not present

## 2015-08-16 DIAGNOSIS — M1991 Primary osteoarthritis, unspecified site: Secondary | ICD-10-CM | POA: Diagnosis not present

## 2015-08-16 DIAGNOSIS — I10 Essential (primary) hypertension: Secondary | ICD-10-CM | POA: Diagnosis not present

## 2015-08-16 DIAGNOSIS — F329 Major depressive disorder, single episode, unspecified: Secondary | ICD-10-CM | POA: Diagnosis not present

## 2015-08-16 DIAGNOSIS — F419 Anxiety disorder, unspecified: Secondary | ICD-10-CM | POA: Diagnosis not present

## 2015-08-16 DIAGNOSIS — H269 Unspecified cataract: Secondary | ICD-10-CM | POA: Diagnosis not present

## 2015-08-16 DIAGNOSIS — T86828 Other complications of skin graft (allograft) (autograft): Secondary | ICD-10-CM | POA: Diagnosis not present

## 2015-08-19 DIAGNOSIS — M1991 Primary osteoarthritis, unspecified site: Secondary | ICD-10-CM | POA: Diagnosis not present

## 2015-08-19 DIAGNOSIS — T86828 Other complications of skin graft (allograft) (autograft): Secondary | ICD-10-CM | POA: Diagnosis not present

## 2015-08-19 DIAGNOSIS — I10 Essential (primary) hypertension: Secondary | ICD-10-CM | POA: Diagnosis not present

## 2015-08-19 DIAGNOSIS — F329 Major depressive disorder, single episode, unspecified: Secondary | ICD-10-CM | POA: Diagnosis not present

## 2015-08-19 DIAGNOSIS — H269 Unspecified cataract: Secondary | ICD-10-CM | POA: Diagnosis not present

## 2015-08-19 DIAGNOSIS — F419 Anxiety disorder, unspecified: Secondary | ICD-10-CM | POA: Diagnosis not present

## 2015-08-21 DIAGNOSIS — Z79899 Other long term (current) drug therapy: Secondary | ICD-10-CM | POA: Diagnosis not present

## 2015-08-21 DIAGNOSIS — M1711 Unilateral primary osteoarthritis, right knee: Secondary | ICD-10-CM | POA: Diagnosis not present

## 2015-08-21 DIAGNOSIS — I10 Essential (primary) hypertension: Secondary | ICD-10-CM | POA: Diagnosis not present

## 2015-08-25 DIAGNOSIS — M1711 Unilateral primary osteoarthritis, right knee: Secondary | ICD-10-CM | POA: Diagnosis not present

## 2015-08-25 DIAGNOSIS — L97322 Non-pressure chronic ulcer of left ankle with fat layer exposed: Secondary | ICD-10-CM | POA: Diagnosis not present

## 2015-08-25 DIAGNOSIS — I872 Venous insufficiency (chronic) (peripheral): Secondary | ICD-10-CM | POA: Diagnosis not present

## 2015-08-25 DIAGNOSIS — L97819 Non-pressure chronic ulcer of other part of right lower leg with unspecified severity: Secondary | ICD-10-CM | POA: Diagnosis not present

## 2015-08-25 DIAGNOSIS — L97529 Non-pressure chronic ulcer of other part of left foot with unspecified severity: Secondary | ICD-10-CM | POA: Diagnosis not present

## 2015-08-25 DIAGNOSIS — M069 Rheumatoid arthritis, unspecified: Secondary | ICD-10-CM | POA: Diagnosis not present

## 2015-08-25 DIAGNOSIS — I83223 Varicose veins of left lower extremity with both ulcer of ankle and inflammation: Secondary | ICD-10-CM | POA: Diagnosis not present

## 2015-08-25 DIAGNOSIS — L97329 Non-pressure chronic ulcer of left ankle with unspecified severity: Secondary | ICD-10-CM | POA: Diagnosis not present

## 2015-08-31 DIAGNOSIS — M25551 Pain in right hip: Secondary | ICD-10-CM | POA: Diagnosis not present

## 2015-08-31 DIAGNOSIS — I10 Essential (primary) hypertension: Secondary | ICD-10-CM | POA: Diagnosis not present

## 2015-08-31 DIAGNOSIS — M170 Bilateral primary osteoarthritis of knee: Secondary | ICD-10-CM | POA: Diagnosis not present

## 2015-09-02 DIAGNOSIS — F419 Anxiety disorder, unspecified: Secondary | ICD-10-CM | POA: Diagnosis not present

## 2015-09-02 DIAGNOSIS — H269 Unspecified cataract: Secondary | ICD-10-CM | POA: Diagnosis not present

## 2015-09-02 DIAGNOSIS — Z9889 Other specified postprocedural states: Secondary | ICD-10-CM | POA: Diagnosis not present

## 2015-09-02 DIAGNOSIS — M1991 Primary osteoarthritis, unspecified site: Secondary | ICD-10-CM | POA: Diagnosis not present

## 2015-09-02 DIAGNOSIS — T86828 Other complications of skin graft (allograft) (autograft): Secondary | ICD-10-CM | POA: Diagnosis not present

## 2015-09-02 DIAGNOSIS — F329 Major depressive disorder, single episode, unspecified: Secondary | ICD-10-CM | POA: Diagnosis not present

## 2015-09-02 DIAGNOSIS — M25551 Pain in right hip: Secondary | ICD-10-CM | POA: Diagnosis not present

## 2015-09-02 DIAGNOSIS — I10 Essential (primary) hypertension: Secondary | ICD-10-CM | POA: Diagnosis not present

## 2015-09-09 DIAGNOSIS — I10 Essential (primary) hypertension: Secondary | ICD-10-CM | POA: Diagnosis not present

## 2015-09-09 DIAGNOSIS — T86828 Other complications of skin graft (allograft) (autograft): Secondary | ICD-10-CM | POA: Diagnosis not present

## 2015-09-09 DIAGNOSIS — F329 Major depressive disorder, single episode, unspecified: Secondary | ICD-10-CM | POA: Diagnosis not present

## 2015-09-09 DIAGNOSIS — M1991 Primary osteoarthritis, unspecified site: Secondary | ICD-10-CM | POA: Diagnosis not present

## 2015-09-09 DIAGNOSIS — F419 Anxiety disorder, unspecified: Secondary | ICD-10-CM | POA: Diagnosis not present

## 2015-09-09 DIAGNOSIS — H269 Unspecified cataract: Secondary | ICD-10-CM | POA: Diagnosis not present

## 2015-09-16 DIAGNOSIS — M1991 Primary osteoarthritis, unspecified site: Secondary | ICD-10-CM | POA: Diagnosis not present

## 2015-09-16 DIAGNOSIS — F329 Major depressive disorder, single episode, unspecified: Secondary | ICD-10-CM | POA: Diagnosis not present

## 2015-09-16 DIAGNOSIS — I10 Essential (primary) hypertension: Secondary | ICD-10-CM | POA: Diagnosis not present

## 2015-09-16 DIAGNOSIS — F419 Anxiety disorder, unspecified: Secondary | ICD-10-CM | POA: Diagnosis not present

## 2015-09-16 DIAGNOSIS — T86828 Other complications of skin graft (allograft) (autograft): Secondary | ICD-10-CM | POA: Diagnosis not present

## 2015-09-16 DIAGNOSIS — H269 Unspecified cataract: Secondary | ICD-10-CM | POA: Diagnosis not present

## 2015-09-20 DIAGNOSIS — M160 Bilateral primary osteoarthritis of hip: Secondary | ICD-10-CM | POA: Diagnosis not present

## 2015-09-20 DIAGNOSIS — M25552 Pain in left hip: Secondary | ICD-10-CM | POA: Diagnosis not present

## 2015-09-20 DIAGNOSIS — M25551 Pain in right hip: Secondary | ICD-10-CM | POA: Diagnosis not present

## 2015-09-22 DIAGNOSIS — M545 Low back pain: Secondary | ICD-10-CM | POA: Diagnosis not present

## 2015-09-22 DIAGNOSIS — R252 Cramp and spasm: Secondary | ICD-10-CM | POA: Diagnosis not present

## 2015-09-23 DIAGNOSIS — F419 Anxiety disorder, unspecified: Secondary | ICD-10-CM | POA: Diagnosis not present

## 2015-09-23 DIAGNOSIS — I10 Essential (primary) hypertension: Secondary | ICD-10-CM | POA: Diagnosis not present

## 2015-09-23 DIAGNOSIS — H269 Unspecified cataract: Secondary | ICD-10-CM | POA: Diagnosis not present

## 2015-09-23 DIAGNOSIS — M1991 Primary osteoarthritis, unspecified site: Secondary | ICD-10-CM | POA: Diagnosis not present

## 2015-09-23 DIAGNOSIS — T86828 Other complications of skin graft (allograft) (autograft): Secondary | ICD-10-CM | POA: Diagnosis not present

## 2015-09-23 DIAGNOSIS — F329 Major depressive disorder, single episode, unspecified: Secondary | ICD-10-CM | POA: Diagnosis not present

## 2015-09-30 DIAGNOSIS — T86828 Other complications of skin graft (allograft) (autograft): Secondary | ICD-10-CM | POA: Diagnosis not present

## 2015-09-30 DIAGNOSIS — M1991 Primary osteoarthritis, unspecified site: Secondary | ICD-10-CM | POA: Diagnosis not present

## 2015-09-30 DIAGNOSIS — F329 Major depressive disorder, single episode, unspecified: Secondary | ICD-10-CM | POA: Diagnosis not present

## 2015-09-30 DIAGNOSIS — I10 Essential (primary) hypertension: Secondary | ICD-10-CM | POA: Diagnosis not present

## 2015-09-30 DIAGNOSIS — H269 Unspecified cataract: Secondary | ICD-10-CM | POA: Diagnosis not present

## 2015-09-30 DIAGNOSIS — F419 Anxiety disorder, unspecified: Secondary | ICD-10-CM | POA: Diagnosis not present

## 2015-09-30 DIAGNOSIS — M158 Other polyosteoarthritis: Secondary | ICD-10-CM | POA: Diagnosis not present

## 2015-10-01 DIAGNOSIS — F329 Major depressive disorder, single episode, unspecified: Secondary | ICD-10-CM | POA: Diagnosis not present

## 2015-10-01 DIAGNOSIS — M1991 Primary osteoarthritis, unspecified site: Secondary | ICD-10-CM | POA: Diagnosis not present

## 2015-10-01 DIAGNOSIS — H269 Unspecified cataract: Secondary | ICD-10-CM | POA: Diagnosis not present

## 2015-10-01 DIAGNOSIS — F419 Anxiety disorder, unspecified: Secondary | ICD-10-CM | POA: Diagnosis not present

## 2015-10-01 DIAGNOSIS — I10 Essential (primary) hypertension: Secondary | ICD-10-CM | POA: Diagnosis not present

## 2015-10-01 DIAGNOSIS — T86828 Other complications of skin graft (allograft) (autograft): Secondary | ICD-10-CM | POA: Diagnosis not present

## 2015-10-06 DIAGNOSIS — F329 Major depressive disorder, single episode, unspecified: Secondary | ICD-10-CM | POA: Diagnosis not present

## 2015-10-06 DIAGNOSIS — I10 Essential (primary) hypertension: Secondary | ICD-10-CM | POA: Diagnosis not present

## 2015-10-06 DIAGNOSIS — M1991 Primary osteoarthritis, unspecified site: Secondary | ICD-10-CM | POA: Diagnosis not present

## 2015-10-06 DIAGNOSIS — H269 Unspecified cataract: Secondary | ICD-10-CM | POA: Diagnosis not present

## 2015-10-06 DIAGNOSIS — T86828 Other complications of skin graft (allograft) (autograft): Secondary | ICD-10-CM | POA: Diagnosis not present

## 2015-10-06 DIAGNOSIS — F419 Anxiety disorder, unspecified: Secondary | ICD-10-CM | POA: Diagnosis not present

## 2015-10-07 DIAGNOSIS — T86828 Other complications of skin graft (allograft) (autograft): Secondary | ICD-10-CM | POA: Diagnosis not present

## 2015-10-07 DIAGNOSIS — F419 Anxiety disorder, unspecified: Secondary | ICD-10-CM | POA: Diagnosis not present

## 2015-10-07 DIAGNOSIS — M1991 Primary osteoarthritis, unspecified site: Secondary | ICD-10-CM | POA: Diagnosis not present

## 2015-10-07 DIAGNOSIS — F329 Major depressive disorder, single episode, unspecified: Secondary | ICD-10-CM | POA: Diagnosis not present

## 2015-10-07 DIAGNOSIS — I10 Essential (primary) hypertension: Secondary | ICD-10-CM | POA: Diagnosis not present

## 2015-10-07 DIAGNOSIS — H269 Unspecified cataract: Secondary | ICD-10-CM | POA: Diagnosis not present

## 2015-10-14 DIAGNOSIS — B351 Tinea unguium: Secondary | ICD-10-CM | POA: Diagnosis not present

## 2015-10-14 DIAGNOSIS — M79675 Pain in left toe(s): Secondary | ICD-10-CM | POA: Diagnosis not present

## 2015-10-14 DIAGNOSIS — H269 Unspecified cataract: Secondary | ICD-10-CM | POA: Diagnosis not present

## 2015-10-14 DIAGNOSIS — T86828 Other complications of skin graft (allograft) (autograft): Secondary | ICD-10-CM | POA: Diagnosis not present

## 2015-10-14 DIAGNOSIS — L84 Corns and callosities: Secondary | ICD-10-CM | POA: Diagnosis not present

## 2015-10-14 DIAGNOSIS — F419 Anxiety disorder, unspecified: Secondary | ICD-10-CM | POA: Diagnosis not present

## 2015-10-14 DIAGNOSIS — F329 Major depressive disorder, single episode, unspecified: Secondary | ICD-10-CM | POA: Diagnosis not present

## 2015-10-14 DIAGNOSIS — L602 Onychogryphosis: Secondary | ICD-10-CM | POA: Diagnosis not present

## 2015-10-14 DIAGNOSIS — I10 Essential (primary) hypertension: Secondary | ICD-10-CM | POA: Diagnosis not present

## 2015-10-14 DIAGNOSIS — L600 Ingrowing nail: Secondary | ICD-10-CM | POA: Diagnosis not present

## 2015-10-14 DIAGNOSIS — M1991 Primary osteoarthritis, unspecified site: Secondary | ICD-10-CM | POA: Diagnosis not present

## 2015-10-14 DIAGNOSIS — I739 Peripheral vascular disease, unspecified: Secondary | ICD-10-CM | POA: Diagnosis not present

## 2015-10-14 DIAGNOSIS — M79674 Pain in right toe(s): Secondary | ICD-10-CM | POA: Diagnosis not present

## 2015-10-15 DIAGNOSIS — F329 Major depressive disorder, single episode, unspecified: Secondary | ICD-10-CM | POA: Diagnosis not present

## 2015-10-15 DIAGNOSIS — I110 Hypertensive heart disease with heart failure: Secondary | ICD-10-CM | POA: Diagnosis not present

## 2015-10-15 DIAGNOSIS — T86828 Other complications of skin graft (allograft) (autograft): Secondary | ICD-10-CM | POA: Diagnosis not present

## 2015-10-15 DIAGNOSIS — I509 Heart failure, unspecified: Secondary | ICD-10-CM | POA: Diagnosis not present

## 2015-10-15 DIAGNOSIS — I739 Peripheral vascular disease, unspecified: Secondary | ICD-10-CM | POA: Diagnosis not present

## 2015-10-15 DIAGNOSIS — F419 Anxiety disorder, unspecified: Secondary | ICD-10-CM | POA: Diagnosis not present

## 2015-10-20 DIAGNOSIS — I110 Hypertensive heart disease with heart failure: Secondary | ICD-10-CM | POA: Diagnosis not present

## 2015-10-20 DIAGNOSIS — I739 Peripheral vascular disease, unspecified: Secondary | ICD-10-CM | POA: Diagnosis not present

## 2015-10-20 DIAGNOSIS — F419 Anxiety disorder, unspecified: Secondary | ICD-10-CM | POA: Diagnosis not present

## 2015-10-20 DIAGNOSIS — T86828 Other complications of skin graft (allograft) (autograft): Secondary | ICD-10-CM | POA: Diagnosis not present

## 2015-10-20 DIAGNOSIS — F329 Major depressive disorder, single episode, unspecified: Secondary | ICD-10-CM | POA: Diagnosis not present

## 2015-10-20 DIAGNOSIS — I509 Heart failure, unspecified: Secondary | ICD-10-CM | POA: Diagnosis not present

## 2015-10-21 DIAGNOSIS — I509 Heart failure, unspecified: Secondary | ICD-10-CM | POA: Diagnosis not present

## 2015-10-21 DIAGNOSIS — F419 Anxiety disorder, unspecified: Secondary | ICD-10-CM | POA: Diagnosis not present

## 2015-10-21 DIAGNOSIS — T86828 Other complications of skin graft (allograft) (autograft): Secondary | ICD-10-CM | POA: Diagnosis not present

## 2015-10-21 DIAGNOSIS — F329 Major depressive disorder, single episode, unspecified: Secondary | ICD-10-CM | POA: Diagnosis not present

## 2015-10-21 DIAGNOSIS — I110 Hypertensive heart disease with heart failure: Secondary | ICD-10-CM | POA: Diagnosis not present

## 2015-10-21 DIAGNOSIS — I739 Peripheral vascular disease, unspecified: Secondary | ICD-10-CM | POA: Diagnosis not present

## 2015-10-27 DIAGNOSIS — I83218 Varicose veins of right lower extremity with both ulcer of other part of lower extremity and inflammation: Secondary | ICD-10-CM | POA: Diagnosis not present

## 2015-10-27 DIAGNOSIS — L97819 Non-pressure chronic ulcer of other part of right lower leg with unspecified severity: Secondary | ICD-10-CM | POA: Diagnosis not present

## 2015-10-27 DIAGNOSIS — M069 Rheumatoid arthritis, unspecified: Secondary | ICD-10-CM | POA: Diagnosis not present

## 2015-10-27 DIAGNOSIS — M1990 Unspecified osteoarthritis, unspecified site: Secondary | ICD-10-CM | POA: Diagnosis not present

## 2015-10-27 DIAGNOSIS — I872 Venous insufficiency (chronic) (peripheral): Secondary | ICD-10-CM | POA: Diagnosis not present

## 2015-10-27 DIAGNOSIS — L97529 Non-pressure chronic ulcer of other part of left foot with unspecified severity: Secondary | ICD-10-CM | POA: Diagnosis not present

## 2015-10-27 DIAGNOSIS — L97522 Non-pressure chronic ulcer of other part of left foot with fat layer exposed: Secondary | ICD-10-CM | POA: Diagnosis not present

## 2015-10-27 DIAGNOSIS — L97329 Non-pressure chronic ulcer of left ankle with unspecified severity: Secondary | ICD-10-CM | POA: Diagnosis not present

## 2015-10-27 DIAGNOSIS — L97519 Non-pressure chronic ulcer of other part of right foot with unspecified severity: Secondary | ICD-10-CM | POA: Diagnosis not present

## 2015-10-27 DIAGNOSIS — I83225 Varicose veins of left lower extremity with both ulcer other part of foot and inflammation: Secondary | ICD-10-CM | POA: Diagnosis not present

## 2015-10-30 DIAGNOSIS — Z23 Encounter for immunization: Secondary | ICD-10-CM | POA: Diagnosis not present

## 2015-11-04 DIAGNOSIS — F329 Major depressive disorder, single episode, unspecified: Secondary | ICD-10-CM | POA: Diagnosis not present

## 2015-11-04 DIAGNOSIS — F419 Anxiety disorder, unspecified: Secondary | ICD-10-CM | POA: Diagnosis not present

## 2015-11-04 DIAGNOSIS — T86828 Other complications of skin graft (allograft) (autograft): Secondary | ICD-10-CM | POA: Diagnosis not present

## 2015-11-04 DIAGNOSIS — I739 Peripheral vascular disease, unspecified: Secondary | ICD-10-CM | POA: Diagnosis not present

## 2015-11-04 DIAGNOSIS — I509 Heart failure, unspecified: Secondary | ICD-10-CM | POA: Diagnosis not present

## 2015-11-04 DIAGNOSIS — I110 Hypertensive heart disease with heart failure: Secondary | ICD-10-CM | POA: Diagnosis not present

## 2015-11-25 DIAGNOSIS — I10 Essential (primary) hypertension: Secondary | ICD-10-CM | POA: Diagnosis not present

## 2015-11-25 DIAGNOSIS — M158 Other polyosteoarthritis: Secondary | ICD-10-CM | POA: Diagnosis not present

## 2015-12-07 DIAGNOSIS — I10 Essential (primary) hypertension: Secondary | ICD-10-CM | POA: Diagnosis not present

## 2015-12-07 DIAGNOSIS — M170 Bilateral primary osteoarthritis of knee: Secondary | ICD-10-CM | POA: Diagnosis not present

## 2015-12-07 DIAGNOSIS — L03031 Cellulitis of right toe: Secondary | ICD-10-CM | POA: Diagnosis not present

## 2015-12-07 DIAGNOSIS — Z131 Encounter for screening for diabetes mellitus: Secondary | ICD-10-CM | POA: Diagnosis not present

## 2015-12-07 DIAGNOSIS — Z1389 Encounter for screening for other disorder: Secondary | ICD-10-CM | POA: Diagnosis not present

## 2015-12-07 DIAGNOSIS — Z0000 Encounter for general adult medical examination without abnormal findings: Secondary | ICD-10-CM | POA: Diagnosis not present

## 2015-12-20 DIAGNOSIS — G8929 Other chronic pain: Secondary | ICD-10-CM | POA: Diagnosis not present

## 2015-12-20 DIAGNOSIS — I10 Essential (primary) hypertension: Secondary | ICD-10-CM | POA: Diagnosis not present

## 2015-12-20 DIAGNOSIS — M1611 Unilateral primary osteoarthritis, right hip: Secondary | ICD-10-CM | POA: Diagnosis not present

## 2015-12-20 DIAGNOSIS — I872 Venous insufficiency (chronic) (peripheral): Secondary | ICD-10-CM | POA: Diagnosis not present

## 2015-12-20 DIAGNOSIS — Z01818 Encounter for other preprocedural examination: Secondary | ICD-10-CM | POA: Diagnosis not present

## 2015-12-20 DIAGNOSIS — E669 Obesity, unspecified: Secondary | ICD-10-CM | POA: Diagnosis not present

## 2015-12-20 DIAGNOSIS — G4700 Insomnia, unspecified: Secondary | ICD-10-CM | POA: Diagnosis not present

## 2015-12-20 DIAGNOSIS — D649 Anemia, unspecified: Secondary | ICD-10-CM | POA: Diagnosis not present

## 2015-12-29 DIAGNOSIS — Z471 Aftercare following joint replacement surgery: Secondary | ICD-10-CM | POA: Diagnosis not present

## 2015-12-29 DIAGNOSIS — L97529 Non-pressure chronic ulcer of other part of left foot with unspecified severity: Secondary | ICD-10-CM | POA: Diagnosis not present

## 2015-12-29 DIAGNOSIS — Z79899 Other long term (current) drug therapy: Secondary | ICD-10-CM | POA: Diagnosis not present

## 2015-12-29 DIAGNOSIS — I872 Venous insufficiency (chronic) (peripheral): Secondary | ICD-10-CM | POA: Diagnosis not present

## 2015-12-29 DIAGNOSIS — I83019 Varicose veins of right lower extremity with ulcer of unspecified site: Secondary | ICD-10-CM | POA: Diagnosis present

## 2015-12-29 DIAGNOSIS — D7589 Other specified diseases of blood and blood-forming organs: Secondary | ICD-10-CM | POA: Diagnosis not present

## 2015-12-29 DIAGNOSIS — I10 Essential (primary) hypertension: Secondary | ICD-10-CM | POA: Diagnosis not present

## 2015-12-29 DIAGNOSIS — L97929 Non-pressure chronic ulcer of unspecified part of left lower leg with unspecified severity: Secondary | ICD-10-CM | POA: Diagnosis not present

## 2015-12-29 DIAGNOSIS — L97519 Non-pressure chronic ulcer of other part of right foot with unspecified severity: Secondary | ICD-10-CM | POA: Diagnosis not present

## 2015-12-29 DIAGNOSIS — Z96651 Presence of right artificial knee joint: Secondary | ICD-10-CM | POA: Diagnosis not present

## 2015-12-29 DIAGNOSIS — I83029 Varicose veins of left lower extremity with ulcer of unspecified site: Secondary | ICD-10-CM | POA: Diagnosis present

## 2015-12-29 DIAGNOSIS — G4700 Insomnia, unspecified: Secondary | ICD-10-CM | POA: Diagnosis not present

## 2015-12-29 DIAGNOSIS — I878 Other specified disorders of veins: Secondary | ICD-10-CM | POA: Diagnosis not present

## 2016-01-05 DIAGNOSIS — Z961 Presence of intraocular lens: Secondary | ICD-10-CM | POA: Diagnosis not present

## 2016-01-05 DIAGNOSIS — H26493 Other secondary cataract, bilateral: Secondary | ICD-10-CM | POA: Diagnosis not present

## 2016-01-13 DIAGNOSIS — M79674 Pain in right toe(s): Secondary | ICD-10-CM | POA: Diagnosis not present

## 2016-01-13 DIAGNOSIS — B351 Tinea unguium: Secondary | ICD-10-CM | POA: Diagnosis not present

## 2016-01-13 DIAGNOSIS — L602 Onychogryphosis: Secondary | ICD-10-CM | POA: Diagnosis not present

## 2016-01-13 DIAGNOSIS — M79675 Pain in left toe(s): Secondary | ICD-10-CM | POA: Diagnosis not present

## 2016-01-13 DIAGNOSIS — I739 Peripheral vascular disease, unspecified: Secondary | ICD-10-CM | POA: Diagnosis not present

## 2016-01-18 DIAGNOSIS — J988 Other specified respiratory disorders: Secondary | ICD-10-CM | POA: Diagnosis not present

## 2016-01-18 DIAGNOSIS — R05 Cough: Secondary | ICD-10-CM | POA: Diagnosis not present

## 2016-01-20 DIAGNOSIS — I83218 Varicose veins of right lower extremity with both ulcer of other part of lower extremity and inflammation: Secondary | ICD-10-CM | POA: Diagnosis not present

## 2016-01-20 DIAGNOSIS — L97818 Non-pressure chronic ulcer of other part of right lower leg with other specified severity: Secondary | ICD-10-CM | POA: Diagnosis not present

## 2016-01-20 DIAGNOSIS — I872 Venous insufficiency (chronic) (peripheral): Secondary | ICD-10-CM | POA: Diagnosis not present

## 2016-01-20 DIAGNOSIS — L97812 Non-pressure chronic ulcer of other part of right lower leg with fat layer exposed: Secondary | ICD-10-CM | POA: Diagnosis not present

## 2016-01-20 DIAGNOSIS — L97328 Non-pressure chronic ulcer of left ankle with other specified severity: Secondary | ICD-10-CM | POA: Diagnosis not present

## 2016-01-20 DIAGNOSIS — I83223 Varicose veins of left lower extremity with both ulcer of ankle and inflammation: Secondary | ICD-10-CM | POA: Diagnosis not present

## 2016-01-25 DIAGNOSIS — I110 Hypertensive heart disease with heart failure: Secondary | ICD-10-CM | POA: Diagnosis not present

## 2016-01-25 DIAGNOSIS — I872 Venous insufficiency (chronic) (peripheral): Secondary | ICD-10-CM | POA: Diagnosis not present

## 2016-01-25 DIAGNOSIS — I739 Peripheral vascular disease, unspecified: Secondary | ICD-10-CM | POA: Diagnosis not present

## 2016-01-25 DIAGNOSIS — I509 Heart failure, unspecified: Secondary | ICD-10-CM | POA: Diagnosis not present

## 2016-01-25 DIAGNOSIS — L97321 Non-pressure chronic ulcer of left ankle limited to breakdown of skin: Secondary | ICD-10-CM | POA: Diagnosis not present

## 2016-01-25 DIAGNOSIS — L97811 Non-pressure chronic ulcer of other part of right lower leg limited to breakdown of skin: Secondary | ICD-10-CM | POA: Diagnosis not present

## 2016-01-27 DIAGNOSIS — I739 Peripheral vascular disease, unspecified: Secondary | ICD-10-CM | POA: Diagnosis not present

## 2016-01-27 DIAGNOSIS — I110 Hypertensive heart disease with heart failure: Secondary | ICD-10-CM | POA: Diagnosis not present

## 2016-01-27 DIAGNOSIS — L97321 Non-pressure chronic ulcer of left ankle limited to breakdown of skin: Secondary | ICD-10-CM | POA: Diagnosis not present

## 2016-01-27 DIAGNOSIS — I872 Venous insufficiency (chronic) (peripheral): Secondary | ICD-10-CM | POA: Diagnosis not present

## 2016-01-27 DIAGNOSIS — I509 Heart failure, unspecified: Secondary | ICD-10-CM | POA: Diagnosis not present

## 2016-01-27 DIAGNOSIS — L97811 Non-pressure chronic ulcer of other part of right lower leg limited to breakdown of skin: Secondary | ICD-10-CM | POA: Diagnosis not present

## 2016-02-01 DIAGNOSIS — M170 Bilateral primary osteoarthritis of knee: Secondary | ICD-10-CM | POA: Diagnosis not present

## 2016-02-01 DIAGNOSIS — I509 Heart failure, unspecified: Secondary | ICD-10-CM | POA: Diagnosis not present

## 2016-02-01 DIAGNOSIS — I10 Essential (primary) hypertension: Secondary | ICD-10-CM | POA: Diagnosis not present

## 2016-02-01 DIAGNOSIS — I739 Peripheral vascular disease, unspecified: Secondary | ICD-10-CM | POA: Diagnosis not present

## 2016-02-01 DIAGNOSIS — I872 Venous insufficiency (chronic) (peripheral): Secondary | ICD-10-CM | POA: Diagnosis not present

## 2016-02-01 DIAGNOSIS — L97321 Non-pressure chronic ulcer of left ankle limited to breakdown of skin: Secondary | ICD-10-CM | POA: Diagnosis not present

## 2016-02-01 DIAGNOSIS — I110 Hypertensive heart disease with heart failure: Secondary | ICD-10-CM | POA: Diagnosis not present

## 2016-02-01 DIAGNOSIS — M158 Other polyosteoarthritis: Secondary | ICD-10-CM | POA: Diagnosis not present

## 2016-02-01 DIAGNOSIS — L97811 Non-pressure chronic ulcer of other part of right lower leg limited to breakdown of skin: Secondary | ICD-10-CM | POA: Diagnosis not present

## 2016-02-04 DIAGNOSIS — L97811 Non-pressure chronic ulcer of other part of right lower leg limited to breakdown of skin: Secondary | ICD-10-CM | POA: Diagnosis not present

## 2016-02-04 DIAGNOSIS — I872 Venous insufficiency (chronic) (peripheral): Secondary | ICD-10-CM | POA: Diagnosis not present

## 2016-02-04 DIAGNOSIS — I110 Hypertensive heart disease with heart failure: Secondary | ICD-10-CM | POA: Diagnosis not present

## 2016-02-04 DIAGNOSIS — L97321 Non-pressure chronic ulcer of left ankle limited to breakdown of skin: Secondary | ICD-10-CM | POA: Diagnosis not present

## 2016-02-04 DIAGNOSIS — I509 Heart failure, unspecified: Secondary | ICD-10-CM | POA: Diagnosis not present

## 2016-02-04 DIAGNOSIS — I739 Peripheral vascular disease, unspecified: Secondary | ICD-10-CM | POA: Diagnosis not present

## 2016-02-08 DIAGNOSIS — I872 Venous insufficiency (chronic) (peripheral): Secondary | ICD-10-CM | POA: Diagnosis not present

## 2016-02-08 DIAGNOSIS — I509 Heart failure, unspecified: Secondary | ICD-10-CM | POA: Diagnosis not present

## 2016-02-08 DIAGNOSIS — L97321 Non-pressure chronic ulcer of left ankle limited to breakdown of skin: Secondary | ICD-10-CM | POA: Diagnosis not present

## 2016-02-08 DIAGNOSIS — L97811 Non-pressure chronic ulcer of other part of right lower leg limited to breakdown of skin: Secondary | ICD-10-CM | POA: Diagnosis not present

## 2016-02-08 DIAGNOSIS — I739 Peripheral vascular disease, unspecified: Secondary | ICD-10-CM | POA: Diagnosis not present

## 2016-02-08 DIAGNOSIS — I110 Hypertensive heart disease with heart failure: Secondary | ICD-10-CM | POA: Diagnosis not present

## 2016-02-11 DIAGNOSIS — L97811 Non-pressure chronic ulcer of other part of right lower leg limited to breakdown of skin: Secondary | ICD-10-CM | POA: Diagnosis not present

## 2016-02-11 DIAGNOSIS — I110 Hypertensive heart disease with heart failure: Secondary | ICD-10-CM | POA: Diagnosis not present

## 2016-02-11 DIAGNOSIS — I739 Peripheral vascular disease, unspecified: Secondary | ICD-10-CM | POA: Diagnosis not present

## 2016-02-11 DIAGNOSIS — I509 Heart failure, unspecified: Secondary | ICD-10-CM | POA: Diagnosis not present

## 2016-02-11 DIAGNOSIS — L97321 Non-pressure chronic ulcer of left ankle limited to breakdown of skin: Secondary | ICD-10-CM | POA: Diagnosis not present

## 2016-02-11 DIAGNOSIS — I872 Venous insufficiency (chronic) (peripheral): Secondary | ICD-10-CM | POA: Diagnosis not present

## 2016-02-15 DIAGNOSIS — I739 Peripheral vascular disease, unspecified: Secondary | ICD-10-CM | POA: Diagnosis not present

## 2016-02-15 DIAGNOSIS — I509 Heart failure, unspecified: Secondary | ICD-10-CM | POA: Diagnosis not present

## 2016-02-15 DIAGNOSIS — I110 Hypertensive heart disease with heart failure: Secondary | ICD-10-CM | POA: Diagnosis not present

## 2016-02-15 DIAGNOSIS — L97811 Non-pressure chronic ulcer of other part of right lower leg limited to breakdown of skin: Secondary | ICD-10-CM | POA: Diagnosis not present

## 2016-02-15 DIAGNOSIS — I872 Venous insufficiency (chronic) (peripheral): Secondary | ICD-10-CM | POA: Diagnosis not present

## 2016-02-15 DIAGNOSIS — L97321 Non-pressure chronic ulcer of left ankle limited to breakdown of skin: Secondary | ICD-10-CM | POA: Diagnosis not present

## 2016-02-18 DIAGNOSIS — L97811 Non-pressure chronic ulcer of other part of right lower leg limited to breakdown of skin: Secondary | ICD-10-CM | POA: Diagnosis not present

## 2016-02-18 DIAGNOSIS — I110 Hypertensive heart disease with heart failure: Secondary | ICD-10-CM | POA: Diagnosis not present

## 2016-02-18 DIAGNOSIS — I739 Peripheral vascular disease, unspecified: Secondary | ICD-10-CM | POA: Diagnosis not present

## 2016-02-18 DIAGNOSIS — I509 Heart failure, unspecified: Secondary | ICD-10-CM | POA: Diagnosis not present

## 2016-02-18 DIAGNOSIS — I872 Venous insufficiency (chronic) (peripheral): Secondary | ICD-10-CM | POA: Diagnosis not present

## 2016-02-18 DIAGNOSIS — L97321 Non-pressure chronic ulcer of left ankle limited to breakdown of skin: Secondary | ICD-10-CM | POA: Diagnosis not present

## 2016-02-22 DIAGNOSIS — S81801A Unspecified open wound, right lower leg, initial encounter: Secondary | ICD-10-CM | POA: Diagnosis not present

## 2016-02-22 DIAGNOSIS — L03116 Cellulitis of left lower limb: Secondary | ICD-10-CM | POA: Diagnosis not present

## 2016-02-22 DIAGNOSIS — B9562 Methicillin resistant Staphylococcus aureus infection as the cause of diseases classified elsewhere: Secondary | ICD-10-CM | POA: Diagnosis not present

## 2016-02-22 DIAGNOSIS — L97329 Non-pressure chronic ulcer of left ankle with unspecified severity: Secondary | ICD-10-CM | POA: Diagnosis not present

## 2016-02-22 DIAGNOSIS — I110 Hypertensive heart disease with heart failure: Secondary | ICD-10-CM | POA: Diagnosis not present

## 2016-02-22 DIAGNOSIS — L03115 Cellulitis of right lower limb: Secondary | ICD-10-CM | POA: Diagnosis not present

## 2016-02-22 DIAGNOSIS — S81802A Unspecified open wound, left lower leg, initial encounter: Secondary | ICD-10-CM | POA: Diagnosis not present

## 2016-02-22 DIAGNOSIS — L97321 Non-pressure chronic ulcer of left ankle limited to breakdown of skin: Secondary | ICD-10-CM | POA: Diagnosis not present

## 2016-02-22 DIAGNOSIS — L97318 Non-pressure chronic ulcer of right ankle with other specified severity: Secondary | ICD-10-CM | POA: Diagnosis not present

## 2016-02-22 DIAGNOSIS — L97811 Non-pressure chronic ulcer of other part of right lower leg limited to breakdown of skin: Secondary | ICD-10-CM | POA: Diagnosis not present

## 2016-02-22 DIAGNOSIS — L97328 Non-pressure chronic ulcer of left ankle with other specified severity: Secondary | ICD-10-CM | POA: Diagnosis not present

## 2016-02-22 DIAGNOSIS — I509 Heart failure, unspecified: Secondary | ICD-10-CM | POA: Diagnosis not present

## 2016-02-22 DIAGNOSIS — I739 Peripheral vascular disease, unspecified: Secondary | ICD-10-CM | POA: Diagnosis not present

## 2016-02-22 DIAGNOSIS — L97319 Non-pressure chronic ulcer of right ankle with unspecified severity: Secondary | ICD-10-CM | POA: Diagnosis not present

## 2016-02-22 DIAGNOSIS — I872 Venous insufficiency (chronic) (peripheral): Secondary | ICD-10-CM | POA: Diagnosis not present

## 2016-02-23 DIAGNOSIS — I872 Venous insufficiency (chronic) (peripheral): Secondary | ICD-10-CM | POA: Diagnosis not present

## 2016-02-23 DIAGNOSIS — L03116 Cellulitis of left lower limb: Secondary | ICD-10-CM | POA: Diagnosis not present

## 2016-02-23 DIAGNOSIS — B9562 Methicillin resistant Staphylococcus aureus infection as the cause of diseases classified elsewhere: Secondary | ICD-10-CM | POA: Diagnosis not present

## 2016-02-23 DIAGNOSIS — L97318 Non-pressure chronic ulcer of right ankle with other specified severity: Secondary | ICD-10-CM | POA: Diagnosis not present

## 2016-02-23 DIAGNOSIS — L97328 Non-pressure chronic ulcer of left ankle with other specified severity: Secondary | ICD-10-CM | POA: Diagnosis not present

## 2016-02-23 DIAGNOSIS — L03115 Cellulitis of right lower limb: Secondary | ICD-10-CM | POA: Diagnosis not present

## 2016-02-24 DIAGNOSIS — L97328 Non-pressure chronic ulcer of left ankle with other specified severity: Secondary | ICD-10-CM | POA: Diagnosis not present

## 2016-02-24 DIAGNOSIS — L03115 Cellulitis of right lower limb: Secondary | ICD-10-CM | POA: Diagnosis not present

## 2016-02-24 DIAGNOSIS — L97318 Non-pressure chronic ulcer of right ankle with other specified severity: Secondary | ICD-10-CM | POA: Diagnosis not present

## 2016-02-24 DIAGNOSIS — I872 Venous insufficiency (chronic) (peripheral): Secondary | ICD-10-CM | POA: Diagnosis not present

## 2016-02-24 DIAGNOSIS — L03116 Cellulitis of left lower limb: Secondary | ICD-10-CM | POA: Diagnosis not present

## 2016-02-24 DIAGNOSIS — B9562 Methicillin resistant Staphylococcus aureus infection as the cause of diseases classified elsewhere: Secondary | ICD-10-CM | POA: Diagnosis not present

## 2016-02-25 DIAGNOSIS — I10 Essential (primary) hypertension: Secondary | ICD-10-CM | POA: Diagnosis present

## 2016-02-25 DIAGNOSIS — Z801 Family history of malignant neoplasm of trachea, bronchus and lung: Secondary | ICD-10-CM | POA: Diagnosis not present

## 2016-02-25 DIAGNOSIS — B9562 Methicillin resistant Staphylococcus aureus infection as the cause of diseases classified elsewhere: Secondary | ICD-10-CM | POA: Diagnosis present

## 2016-02-25 DIAGNOSIS — I872 Venous insufficiency (chronic) (peripheral): Secondary | ICD-10-CM | POA: Diagnosis present

## 2016-02-25 DIAGNOSIS — Z833 Family history of diabetes mellitus: Secondary | ICD-10-CM | POA: Diagnosis not present

## 2016-02-25 DIAGNOSIS — L97328 Non-pressure chronic ulcer of left ankle with other specified severity: Secondary | ICD-10-CM | POA: Diagnosis not present

## 2016-02-25 DIAGNOSIS — L97318 Non-pressure chronic ulcer of right ankle with other specified severity: Secondary | ICD-10-CM | POA: Diagnosis not present

## 2016-02-25 DIAGNOSIS — L97329 Non-pressure chronic ulcer of left ankle with unspecified severity: Secondary | ICD-10-CM | POA: Diagnosis present

## 2016-02-25 DIAGNOSIS — L03115 Cellulitis of right lower limb: Secondary | ICD-10-CM | POA: Diagnosis present

## 2016-02-25 DIAGNOSIS — L03116 Cellulitis of left lower limb: Secondary | ICD-10-CM | POA: Diagnosis present

## 2016-02-25 DIAGNOSIS — Z800 Family history of malignant neoplasm of digestive organs: Secondary | ICD-10-CM | POA: Diagnosis not present

## 2016-02-25 DIAGNOSIS — Z780 Asymptomatic menopausal state: Secondary | ICD-10-CM | POA: Diagnosis not present

## 2016-02-25 DIAGNOSIS — Z886 Allergy status to analgesic agent status: Secondary | ICD-10-CM | POA: Diagnosis not present

## 2016-02-25 DIAGNOSIS — Z6833 Body mass index (BMI) 33.0-33.9, adult: Secondary | ICD-10-CM | POA: Diagnosis not present

## 2016-02-25 DIAGNOSIS — Z880 Allergy status to penicillin: Secondary | ICD-10-CM | POA: Diagnosis not present

## 2016-02-25 DIAGNOSIS — Z79899 Other long term (current) drug therapy: Secondary | ICD-10-CM | POA: Diagnosis not present

## 2016-02-25 DIAGNOSIS — M1990 Unspecified osteoarthritis, unspecified site: Secondary | ICD-10-CM | POA: Diagnosis present

## 2016-02-25 DIAGNOSIS — L97319 Non-pressure chronic ulcer of right ankle with unspecified severity: Secondary | ICD-10-CM | POA: Diagnosis present

## 2016-02-25 DIAGNOSIS — G4700 Insomnia, unspecified: Secondary | ICD-10-CM | POA: Diagnosis present

## 2016-02-25 DIAGNOSIS — Z96643 Presence of artificial hip joint, bilateral: Secondary | ICD-10-CM | POA: Diagnosis present

## 2016-02-29 DIAGNOSIS — L97811 Non-pressure chronic ulcer of other part of right lower leg limited to breakdown of skin: Secondary | ICD-10-CM | POA: Diagnosis not present

## 2016-02-29 DIAGNOSIS — L97321 Non-pressure chronic ulcer of left ankle limited to breakdown of skin: Secondary | ICD-10-CM | POA: Diagnosis not present

## 2016-02-29 DIAGNOSIS — I509 Heart failure, unspecified: Secondary | ICD-10-CM | POA: Diagnosis not present

## 2016-02-29 DIAGNOSIS — I872 Venous insufficiency (chronic) (peripheral): Secondary | ICD-10-CM | POA: Diagnosis not present

## 2016-02-29 DIAGNOSIS — I739 Peripheral vascular disease, unspecified: Secondary | ICD-10-CM | POA: Diagnosis not present

## 2016-02-29 DIAGNOSIS — I110 Hypertensive heart disease with heart failure: Secondary | ICD-10-CM | POA: Diagnosis not present

## 2016-03-02 DIAGNOSIS — I872 Venous insufficiency (chronic) (peripheral): Secondary | ICD-10-CM | POA: Diagnosis not present

## 2016-03-02 DIAGNOSIS — I739 Peripheral vascular disease, unspecified: Secondary | ICD-10-CM | POA: Diagnosis not present

## 2016-03-02 DIAGNOSIS — I110 Hypertensive heart disease with heart failure: Secondary | ICD-10-CM | POA: Diagnosis not present

## 2016-03-02 DIAGNOSIS — I509 Heart failure, unspecified: Secondary | ICD-10-CM | POA: Diagnosis not present

## 2016-03-02 DIAGNOSIS — L97321 Non-pressure chronic ulcer of left ankle limited to breakdown of skin: Secondary | ICD-10-CM | POA: Diagnosis not present

## 2016-03-02 DIAGNOSIS — L97811 Non-pressure chronic ulcer of other part of right lower leg limited to breakdown of skin: Secondary | ICD-10-CM | POA: Diagnosis not present

## 2016-03-04 DIAGNOSIS — I110 Hypertensive heart disease with heart failure: Secondary | ICD-10-CM | POA: Diagnosis not present

## 2016-03-04 DIAGNOSIS — I509 Heart failure, unspecified: Secondary | ICD-10-CM | POA: Diagnosis not present

## 2016-03-04 DIAGNOSIS — I872 Venous insufficiency (chronic) (peripheral): Secondary | ICD-10-CM | POA: Diagnosis not present

## 2016-03-04 DIAGNOSIS — L97811 Non-pressure chronic ulcer of other part of right lower leg limited to breakdown of skin: Secondary | ICD-10-CM | POA: Diagnosis not present

## 2016-03-04 DIAGNOSIS — I739 Peripheral vascular disease, unspecified: Secondary | ICD-10-CM | POA: Diagnosis not present

## 2016-03-04 DIAGNOSIS — L97321 Non-pressure chronic ulcer of left ankle limited to breakdown of skin: Secondary | ICD-10-CM | POA: Diagnosis not present

## 2016-03-06 DIAGNOSIS — I872 Venous insufficiency (chronic) (peripheral): Secondary | ICD-10-CM | POA: Diagnosis not present

## 2016-03-06 DIAGNOSIS — I110 Hypertensive heart disease with heart failure: Secondary | ICD-10-CM | POA: Diagnosis not present

## 2016-03-06 DIAGNOSIS — L97321 Non-pressure chronic ulcer of left ankle limited to breakdown of skin: Secondary | ICD-10-CM | POA: Diagnosis not present

## 2016-03-06 DIAGNOSIS — I739 Peripheral vascular disease, unspecified: Secondary | ICD-10-CM | POA: Diagnosis not present

## 2016-03-06 DIAGNOSIS — I509 Heart failure, unspecified: Secondary | ICD-10-CM | POA: Diagnosis not present

## 2016-03-06 DIAGNOSIS — L97811 Non-pressure chronic ulcer of other part of right lower leg limited to breakdown of skin: Secondary | ICD-10-CM | POA: Diagnosis not present

## 2016-03-07 DIAGNOSIS — M170 Bilateral primary osteoarthritis of knee: Secondary | ICD-10-CM | POA: Diagnosis not present

## 2016-03-07 DIAGNOSIS — L03818 Cellulitis of other sites: Secondary | ICD-10-CM | POA: Diagnosis not present

## 2016-03-07 DIAGNOSIS — I10 Essential (primary) hypertension: Secondary | ICD-10-CM | POA: Diagnosis not present

## 2016-03-10 DIAGNOSIS — L97811 Non-pressure chronic ulcer of other part of right lower leg limited to breakdown of skin: Secondary | ICD-10-CM | POA: Diagnosis not present

## 2016-03-10 DIAGNOSIS — I872 Venous insufficiency (chronic) (peripheral): Secondary | ICD-10-CM | POA: Diagnosis not present

## 2016-03-10 DIAGNOSIS — L97321 Non-pressure chronic ulcer of left ankle limited to breakdown of skin: Secondary | ICD-10-CM | POA: Diagnosis not present

## 2016-03-10 DIAGNOSIS — I509 Heart failure, unspecified: Secondary | ICD-10-CM | POA: Diagnosis not present

## 2016-03-10 DIAGNOSIS — I110 Hypertensive heart disease with heart failure: Secondary | ICD-10-CM | POA: Diagnosis not present

## 2016-03-10 DIAGNOSIS — I739 Peripheral vascular disease, unspecified: Secondary | ICD-10-CM | POA: Diagnosis not present

## 2016-03-13 DIAGNOSIS — I509 Heart failure, unspecified: Secondary | ICD-10-CM | POA: Diagnosis not present

## 2016-03-13 DIAGNOSIS — L97321 Non-pressure chronic ulcer of left ankle limited to breakdown of skin: Secondary | ICD-10-CM | POA: Diagnosis not present

## 2016-03-13 DIAGNOSIS — I110 Hypertensive heart disease with heart failure: Secondary | ICD-10-CM | POA: Diagnosis not present

## 2016-03-13 DIAGNOSIS — L97811 Non-pressure chronic ulcer of other part of right lower leg limited to breakdown of skin: Secondary | ICD-10-CM | POA: Diagnosis not present

## 2016-03-13 DIAGNOSIS — I872 Venous insufficiency (chronic) (peripheral): Secondary | ICD-10-CM | POA: Diagnosis not present

## 2016-03-13 DIAGNOSIS — I739 Peripheral vascular disease, unspecified: Secondary | ICD-10-CM | POA: Diagnosis not present

## 2016-03-16 DIAGNOSIS — I739 Peripheral vascular disease, unspecified: Secondary | ICD-10-CM | POA: Diagnosis not present

## 2016-03-16 DIAGNOSIS — I872 Venous insufficiency (chronic) (peripheral): Secondary | ICD-10-CM | POA: Diagnosis not present

## 2016-03-16 DIAGNOSIS — I110 Hypertensive heart disease with heart failure: Secondary | ICD-10-CM | POA: Diagnosis not present

## 2016-03-16 DIAGNOSIS — L97811 Non-pressure chronic ulcer of other part of right lower leg limited to breakdown of skin: Secondary | ICD-10-CM | POA: Diagnosis not present

## 2016-03-16 DIAGNOSIS — I509 Heart failure, unspecified: Secondary | ICD-10-CM | POA: Diagnosis not present

## 2016-03-16 DIAGNOSIS — L97321 Non-pressure chronic ulcer of left ankle limited to breakdown of skin: Secondary | ICD-10-CM | POA: Diagnosis not present

## 2016-03-20 DIAGNOSIS — L97811 Non-pressure chronic ulcer of other part of right lower leg limited to breakdown of skin: Secondary | ICD-10-CM | POA: Diagnosis not present

## 2016-03-20 DIAGNOSIS — I739 Peripheral vascular disease, unspecified: Secondary | ICD-10-CM | POA: Diagnosis not present

## 2016-03-20 DIAGNOSIS — I509 Heart failure, unspecified: Secondary | ICD-10-CM | POA: Diagnosis not present

## 2016-03-20 DIAGNOSIS — I872 Venous insufficiency (chronic) (peripheral): Secondary | ICD-10-CM | POA: Diagnosis not present

## 2016-03-20 DIAGNOSIS — I110 Hypertensive heart disease with heart failure: Secondary | ICD-10-CM | POA: Diagnosis not present

## 2016-03-20 DIAGNOSIS — L97321 Non-pressure chronic ulcer of left ankle limited to breakdown of skin: Secondary | ICD-10-CM | POA: Diagnosis not present

## 2016-03-23 DIAGNOSIS — I509 Heart failure, unspecified: Secondary | ICD-10-CM | POA: Diagnosis not present

## 2016-03-23 DIAGNOSIS — L97321 Non-pressure chronic ulcer of left ankle limited to breakdown of skin: Secondary | ICD-10-CM | POA: Diagnosis not present

## 2016-03-23 DIAGNOSIS — L97811 Non-pressure chronic ulcer of other part of right lower leg limited to breakdown of skin: Secondary | ICD-10-CM | POA: Diagnosis not present

## 2016-03-23 DIAGNOSIS — I739 Peripheral vascular disease, unspecified: Secondary | ICD-10-CM | POA: Diagnosis not present

## 2016-03-23 DIAGNOSIS — I872 Venous insufficiency (chronic) (peripheral): Secondary | ICD-10-CM | POA: Diagnosis not present

## 2016-03-23 DIAGNOSIS — I110 Hypertensive heart disease with heart failure: Secondary | ICD-10-CM | POA: Diagnosis not present

## 2016-03-25 DIAGNOSIS — I110 Hypertensive heart disease with heart failure: Secondary | ICD-10-CM | POA: Diagnosis not present

## 2016-03-25 DIAGNOSIS — I509 Heart failure, unspecified: Secondary | ICD-10-CM | POA: Diagnosis not present

## 2016-03-25 DIAGNOSIS — I739 Peripheral vascular disease, unspecified: Secondary | ICD-10-CM | POA: Diagnosis not present

## 2016-03-25 DIAGNOSIS — F419 Anxiety disorder, unspecified: Secondary | ICD-10-CM | POA: Diagnosis not present

## 2016-03-25 DIAGNOSIS — F329 Major depressive disorder, single episode, unspecified: Secondary | ICD-10-CM | POA: Diagnosis not present

## 2016-03-25 DIAGNOSIS — M1990 Unspecified osteoarthritis, unspecified site: Secondary | ICD-10-CM | POA: Diagnosis not present

## 2016-03-27 DIAGNOSIS — I110 Hypertensive heart disease with heart failure: Secondary | ICD-10-CM | POA: Diagnosis not present

## 2016-03-27 DIAGNOSIS — F329 Major depressive disorder, single episode, unspecified: Secondary | ICD-10-CM | POA: Diagnosis not present

## 2016-03-27 DIAGNOSIS — F419 Anxiety disorder, unspecified: Secondary | ICD-10-CM | POA: Diagnosis not present

## 2016-03-27 DIAGNOSIS — I739 Peripheral vascular disease, unspecified: Secondary | ICD-10-CM | POA: Diagnosis not present

## 2016-03-27 DIAGNOSIS — M1990 Unspecified osteoarthritis, unspecified site: Secondary | ICD-10-CM | POA: Diagnosis not present

## 2016-03-27 DIAGNOSIS — I509 Heart failure, unspecified: Secondary | ICD-10-CM | POA: Diagnosis not present

## 2016-03-30 DIAGNOSIS — I110 Hypertensive heart disease with heart failure: Secondary | ICD-10-CM | POA: Diagnosis not present

## 2016-03-30 DIAGNOSIS — I509 Heart failure, unspecified: Secondary | ICD-10-CM | POA: Diagnosis not present

## 2016-03-30 DIAGNOSIS — M1990 Unspecified osteoarthritis, unspecified site: Secondary | ICD-10-CM | POA: Diagnosis not present

## 2016-03-30 DIAGNOSIS — I739 Peripheral vascular disease, unspecified: Secondary | ICD-10-CM | POA: Diagnosis not present

## 2016-03-30 DIAGNOSIS — F419 Anxiety disorder, unspecified: Secondary | ICD-10-CM | POA: Diagnosis not present

## 2016-03-30 DIAGNOSIS — F329 Major depressive disorder, single episode, unspecified: Secondary | ICD-10-CM | POA: Diagnosis not present

## 2016-04-06 DIAGNOSIS — F329 Major depressive disorder, single episode, unspecified: Secondary | ICD-10-CM | POA: Diagnosis not present

## 2016-04-06 DIAGNOSIS — M1990 Unspecified osteoarthritis, unspecified site: Secondary | ICD-10-CM | POA: Diagnosis not present

## 2016-04-06 DIAGNOSIS — I739 Peripheral vascular disease, unspecified: Secondary | ICD-10-CM | POA: Diagnosis not present

## 2016-04-06 DIAGNOSIS — I110 Hypertensive heart disease with heart failure: Secondary | ICD-10-CM | POA: Diagnosis not present

## 2016-04-06 DIAGNOSIS — F419 Anxiety disorder, unspecified: Secondary | ICD-10-CM | POA: Diagnosis not present

## 2016-04-06 DIAGNOSIS — I509 Heart failure, unspecified: Secondary | ICD-10-CM | POA: Diagnosis not present

## 2016-04-10 DIAGNOSIS — I509 Heart failure, unspecified: Secondary | ICD-10-CM | POA: Diagnosis not present

## 2016-04-10 DIAGNOSIS — I110 Hypertensive heart disease with heart failure: Secondary | ICD-10-CM | POA: Diagnosis not present

## 2016-04-10 DIAGNOSIS — M1990 Unspecified osteoarthritis, unspecified site: Secondary | ICD-10-CM | POA: Diagnosis not present

## 2016-04-10 DIAGNOSIS — I739 Peripheral vascular disease, unspecified: Secondary | ICD-10-CM | POA: Diagnosis not present

## 2016-04-10 DIAGNOSIS — F329 Major depressive disorder, single episode, unspecified: Secondary | ICD-10-CM | POA: Diagnosis not present

## 2016-04-10 DIAGNOSIS — F419 Anxiety disorder, unspecified: Secondary | ICD-10-CM | POA: Diagnosis not present

## 2016-04-13 DIAGNOSIS — F419 Anxiety disorder, unspecified: Secondary | ICD-10-CM | POA: Diagnosis not present

## 2016-04-13 DIAGNOSIS — F329 Major depressive disorder, single episode, unspecified: Secondary | ICD-10-CM | POA: Diagnosis not present

## 2016-04-13 DIAGNOSIS — I509 Heart failure, unspecified: Secondary | ICD-10-CM | POA: Diagnosis not present

## 2016-04-13 DIAGNOSIS — I110 Hypertensive heart disease with heart failure: Secondary | ICD-10-CM | POA: Diagnosis not present

## 2016-04-13 DIAGNOSIS — I739 Peripheral vascular disease, unspecified: Secondary | ICD-10-CM | POA: Diagnosis not present

## 2016-04-13 DIAGNOSIS — M1990 Unspecified osteoarthritis, unspecified site: Secondary | ICD-10-CM | POA: Diagnosis not present

## 2016-04-17 DIAGNOSIS — I739 Peripheral vascular disease, unspecified: Secondary | ICD-10-CM | POA: Diagnosis not present

## 2016-04-17 DIAGNOSIS — F329 Major depressive disorder, single episode, unspecified: Secondary | ICD-10-CM | POA: Diagnosis not present

## 2016-04-17 DIAGNOSIS — M1990 Unspecified osteoarthritis, unspecified site: Secondary | ICD-10-CM | POA: Diagnosis not present

## 2016-04-17 DIAGNOSIS — F419 Anxiety disorder, unspecified: Secondary | ICD-10-CM | POA: Diagnosis not present

## 2016-04-17 DIAGNOSIS — I509 Heart failure, unspecified: Secondary | ICD-10-CM | POA: Diagnosis not present

## 2016-04-17 DIAGNOSIS — I110 Hypertensive heart disease with heart failure: Secondary | ICD-10-CM | POA: Diagnosis not present

## 2016-04-18 DIAGNOSIS — I739 Peripheral vascular disease, unspecified: Secondary | ICD-10-CM | POA: Diagnosis not present

## 2016-04-18 DIAGNOSIS — L602 Onychogryphosis: Secondary | ICD-10-CM | POA: Diagnosis not present

## 2016-04-18 DIAGNOSIS — M79674 Pain in right toe(s): Secondary | ICD-10-CM | POA: Diagnosis not present

## 2016-04-18 DIAGNOSIS — M79671 Pain in right foot: Secondary | ICD-10-CM | POA: Diagnosis not present

## 2016-04-18 DIAGNOSIS — L600 Ingrowing nail: Secondary | ICD-10-CM | POA: Diagnosis not present

## 2016-04-18 DIAGNOSIS — M79672 Pain in left foot: Secondary | ICD-10-CM | POA: Diagnosis not present

## 2016-04-18 DIAGNOSIS — M7741 Metatarsalgia, right foot: Secondary | ICD-10-CM | POA: Diagnosis not present

## 2016-04-18 DIAGNOSIS — M7742 Metatarsalgia, left foot: Secondary | ICD-10-CM | POA: Diagnosis not present

## 2016-04-18 DIAGNOSIS — M79675 Pain in left toe(s): Secondary | ICD-10-CM | POA: Diagnosis not present

## 2016-04-24 DIAGNOSIS — I110 Hypertensive heart disease with heart failure: Secondary | ICD-10-CM | POA: Diagnosis not present

## 2016-04-24 DIAGNOSIS — F329 Major depressive disorder, single episode, unspecified: Secondary | ICD-10-CM | POA: Diagnosis not present

## 2016-04-24 DIAGNOSIS — F419 Anxiety disorder, unspecified: Secondary | ICD-10-CM | POA: Diagnosis not present

## 2016-04-24 DIAGNOSIS — I509 Heart failure, unspecified: Secondary | ICD-10-CM | POA: Diagnosis not present

## 2016-04-24 DIAGNOSIS — M1990 Unspecified osteoarthritis, unspecified site: Secondary | ICD-10-CM | POA: Diagnosis not present

## 2016-04-24 DIAGNOSIS — I739 Peripheral vascular disease, unspecified: Secondary | ICD-10-CM | POA: Diagnosis not present

## 2016-05-02 DIAGNOSIS — M1990 Unspecified osteoarthritis, unspecified site: Secondary | ICD-10-CM | POA: Diagnosis not present

## 2016-05-02 DIAGNOSIS — I509 Heart failure, unspecified: Secondary | ICD-10-CM | POA: Diagnosis not present

## 2016-05-02 DIAGNOSIS — I110 Hypertensive heart disease with heart failure: Secondary | ICD-10-CM | POA: Diagnosis not present

## 2016-05-02 DIAGNOSIS — I739 Peripheral vascular disease, unspecified: Secondary | ICD-10-CM | POA: Diagnosis not present

## 2016-05-02 DIAGNOSIS — F329 Major depressive disorder, single episode, unspecified: Secondary | ICD-10-CM | POA: Diagnosis not present

## 2016-05-02 DIAGNOSIS — F419 Anxiety disorder, unspecified: Secondary | ICD-10-CM | POA: Diagnosis not present

## 2016-05-04 DIAGNOSIS — I83213 Varicose veins of right lower extremity with both ulcer of ankle and inflammation: Secondary | ICD-10-CM | POA: Diagnosis not present

## 2016-05-04 DIAGNOSIS — L97312 Non-pressure chronic ulcer of right ankle with fat layer exposed: Secondary | ICD-10-CM | POA: Diagnosis not present

## 2016-05-04 DIAGNOSIS — I872 Venous insufficiency (chronic) (peripheral): Secondary | ICD-10-CM | POA: Diagnosis not present

## 2016-05-04 DIAGNOSIS — I83223 Varicose veins of left lower extremity with both ulcer of ankle and inflammation: Secondary | ICD-10-CM | POA: Diagnosis not present

## 2016-05-10 DIAGNOSIS — M1990 Unspecified osteoarthritis, unspecified site: Secondary | ICD-10-CM | POA: Diagnosis not present

## 2016-05-10 DIAGNOSIS — F329 Major depressive disorder, single episode, unspecified: Secondary | ICD-10-CM | POA: Diagnosis not present

## 2016-05-10 DIAGNOSIS — I110 Hypertensive heart disease with heart failure: Secondary | ICD-10-CM | POA: Diagnosis not present

## 2016-05-10 DIAGNOSIS — F419 Anxiety disorder, unspecified: Secondary | ICD-10-CM | POA: Diagnosis not present

## 2016-05-10 DIAGNOSIS — I739 Peripheral vascular disease, unspecified: Secondary | ICD-10-CM | POA: Diagnosis not present

## 2016-05-10 DIAGNOSIS — I509 Heart failure, unspecified: Secondary | ICD-10-CM | POA: Diagnosis not present

## 2016-05-16 DIAGNOSIS — M1990 Unspecified osteoarthritis, unspecified site: Secondary | ICD-10-CM | POA: Diagnosis not present

## 2016-05-16 DIAGNOSIS — F329 Major depressive disorder, single episode, unspecified: Secondary | ICD-10-CM | POA: Diagnosis not present

## 2016-05-16 DIAGNOSIS — F419 Anxiety disorder, unspecified: Secondary | ICD-10-CM | POA: Diagnosis not present

## 2016-05-16 DIAGNOSIS — I739 Peripheral vascular disease, unspecified: Secondary | ICD-10-CM | POA: Diagnosis not present

## 2016-05-16 DIAGNOSIS — I509 Heart failure, unspecified: Secondary | ICD-10-CM | POA: Diagnosis not present

## 2016-05-16 DIAGNOSIS — I110 Hypertensive heart disease with heart failure: Secondary | ICD-10-CM | POA: Diagnosis not present

## 2016-05-18 DIAGNOSIS — M158 Other polyosteoarthritis: Secondary | ICD-10-CM | POA: Diagnosis not present

## 2016-05-18 DIAGNOSIS — I10 Essential (primary) hypertension: Secondary | ICD-10-CM | POA: Diagnosis not present

## 2016-05-24 DIAGNOSIS — I739 Peripheral vascular disease, unspecified: Secondary | ICD-10-CM | POA: Diagnosis not present

## 2016-05-24 DIAGNOSIS — I509 Heart failure, unspecified: Secondary | ICD-10-CM | POA: Diagnosis not present

## 2016-05-24 DIAGNOSIS — L97321 Non-pressure chronic ulcer of left ankle limited to breakdown of skin: Secondary | ICD-10-CM | POA: Diagnosis not present

## 2016-05-24 DIAGNOSIS — I110 Hypertensive heart disease with heart failure: Secondary | ICD-10-CM | POA: Diagnosis not present

## 2016-05-24 DIAGNOSIS — I872 Venous insufficiency (chronic) (peripheral): Secondary | ICD-10-CM | POA: Diagnosis not present

## 2016-05-24 DIAGNOSIS — L97811 Non-pressure chronic ulcer of other part of right lower leg limited to breakdown of skin: Secondary | ICD-10-CM | POA: Diagnosis not present

## 2016-05-31 DIAGNOSIS — I110 Hypertensive heart disease with heart failure: Secondary | ICD-10-CM | POA: Diagnosis not present

## 2016-05-31 DIAGNOSIS — I872 Venous insufficiency (chronic) (peripheral): Secondary | ICD-10-CM | POA: Diagnosis not present

## 2016-05-31 DIAGNOSIS — I739 Peripheral vascular disease, unspecified: Secondary | ICD-10-CM | POA: Diagnosis not present

## 2016-05-31 DIAGNOSIS — I509 Heart failure, unspecified: Secondary | ICD-10-CM | POA: Diagnosis not present

## 2016-05-31 DIAGNOSIS — L97321 Non-pressure chronic ulcer of left ankle limited to breakdown of skin: Secondary | ICD-10-CM | POA: Diagnosis not present

## 2016-05-31 DIAGNOSIS — L97811 Non-pressure chronic ulcer of other part of right lower leg limited to breakdown of skin: Secondary | ICD-10-CM | POA: Diagnosis not present

## 2016-06-01 DIAGNOSIS — I83213 Varicose veins of right lower extremity with both ulcer of ankle and inflammation: Secondary | ICD-10-CM | POA: Diagnosis not present

## 2016-06-01 DIAGNOSIS — L97312 Non-pressure chronic ulcer of right ankle with fat layer exposed: Secondary | ICD-10-CM | POA: Diagnosis not present

## 2016-06-01 DIAGNOSIS — I83223 Varicose veins of left lower extremity with both ulcer of ankle and inflammation: Secondary | ICD-10-CM | POA: Diagnosis not present

## 2016-06-01 DIAGNOSIS — I872 Venous insufficiency (chronic) (peripheral): Secondary | ICD-10-CM | POA: Diagnosis not present

## 2016-06-05 DIAGNOSIS — I10 Essential (primary) hypertension: Secondary | ICD-10-CM | POA: Diagnosis not present

## 2016-06-05 DIAGNOSIS — M158 Other polyosteoarthritis: Secondary | ICD-10-CM | POA: Diagnosis not present

## 2016-06-07 DIAGNOSIS — I872 Venous insufficiency (chronic) (peripheral): Secondary | ICD-10-CM | POA: Diagnosis not present

## 2016-06-07 DIAGNOSIS — I509 Heart failure, unspecified: Secondary | ICD-10-CM | POA: Diagnosis not present

## 2016-06-07 DIAGNOSIS — L97321 Non-pressure chronic ulcer of left ankle limited to breakdown of skin: Secondary | ICD-10-CM | POA: Diagnosis not present

## 2016-06-07 DIAGNOSIS — L97811 Non-pressure chronic ulcer of other part of right lower leg limited to breakdown of skin: Secondary | ICD-10-CM | POA: Diagnosis not present

## 2016-06-07 DIAGNOSIS — I110 Hypertensive heart disease with heart failure: Secondary | ICD-10-CM | POA: Diagnosis not present

## 2016-06-07 DIAGNOSIS — I739 Peripheral vascular disease, unspecified: Secondary | ICD-10-CM | POA: Diagnosis not present

## 2016-06-08 DIAGNOSIS — M170 Bilateral primary osteoarthritis of knee: Secondary | ICD-10-CM | POA: Diagnosis not present

## 2016-06-08 DIAGNOSIS — I10 Essential (primary) hypertension: Secondary | ICD-10-CM | POA: Diagnosis not present

## 2016-06-08 DIAGNOSIS — G4709 Other insomnia: Secondary | ICD-10-CM | POA: Diagnosis not present

## 2016-06-14 DIAGNOSIS — Z79899 Other long term (current) drug therapy: Secondary | ICD-10-CM | POA: Diagnosis not present

## 2016-06-14 DIAGNOSIS — E08621 Diabetes mellitus due to underlying condition with foot ulcer: Secondary | ICD-10-CM | POA: Diagnosis not present

## 2016-06-14 DIAGNOSIS — E13622 Other specified diabetes mellitus with other skin ulcer: Secondary | ICD-10-CM | POA: Diagnosis not present

## 2016-06-14 DIAGNOSIS — L97319 Non-pressure chronic ulcer of right ankle with unspecified severity: Secondary | ICD-10-CM | POA: Diagnosis not present

## 2016-06-14 DIAGNOSIS — I10 Essential (primary) hypertension: Secondary | ICD-10-CM | POA: Diagnosis not present

## 2016-06-20 DIAGNOSIS — I872 Venous insufficiency (chronic) (peripheral): Secondary | ICD-10-CM | POA: Diagnosis not present

## 2016-06-20 DIAGNOSIS — I739 Peripheral vascular disease, unspecified: Secondary | ICD-10-CM | POA: Diagnosis not present

## 2016-06-20 DIAGNOSIS — I110 Hypertensive heart disease with heart failure: Secondary | ICD-10-CM | POA: Diagnosis not present

## 2016-06-20 DIAGNOSIS — I509 Heart failure, unspecified: Secondary | ICD-10-CM | POA: Diagnosis not present

## 2016-06-20 DIAGNOSIS — L600 Ingrowing nail: Secondary | ICD-10-CM | POA: Diagnosis not present

## 2016-06-20 DIAGNOSIS — L97811 Non-pressure chronic ulcer of other part of right lower leg limited to breakdown of skin: Secondary | ICD-10-CM | POA: Diagnosis not present

## 2016-06-20 DIAGNOSIS — L84 Corns and callosities: Secondary | ICD-10-CM | POA: Diagnosis not present

## 2016-06-20 DIAGNOSIS — L97321 Non-pressure chronic ulcer of left ankle limited to breakdown of skin: Secondary | ICD-10-CM | POA: Diagnosis not present

## 2016-06-23 DIAGNOSIS — I10 Essential (primary) hypertension: Secondary | ICD-10-CM | POA: Diagnosis not present

## 2016-06-23 DIAGNOSIS — M170 Bilateral primary osteoarthritis of knee: Secondary | ICD-10-CM | POA: Diagnosis not present

## 2016-06-23 DIAGNOSIS — M158 Other polyosteoarthritis: Secondary | ICD-10-CM | POA: Diagnosis not present

## 2016-06-27 DIAGNOSIS — L97321 Non-pressure chronic ulcer of left ankle limited to breakdown of skin: Secondary | ICD-10-CM | POA: Diagnosis not present

## 2016-06-27 DIAGNOSIS — I509 Heart failure, unspecified: Secondary | ICD-10-CM | POA: Diagnosis not present

## 2016-06-27 DIAGNOSIS — I739 Peripheral vascular disease, unspecified: Secondary | ICD-10-CM | POA: Diagnosis not present

## 2016-06-27 DIAGNOSIS — L97811 Non-pressure chronic ulcer of other part of right lower leg limited to breakdown of skin: Secondary | ICD-10-CM | POA: Diagnosis not present

## 2016-06-27 DIAGNOSIS — I872 Venous insufficiency (chronic) (peripheral): Secondary | ICD-10-CM | POA: Diagnosis not present

## 2016-06-27 DIAGNOSIS — I110 Hypertensive heart disease with heart failure: Secondary | ICD-10-CM | POA: Diagnosis not present

## 2016-07-06 DIAGNOSIS — L97811 Non-pressure chronic ulcer of other part of right lower leg limited to breakdown of skin: Secondary | ICD-10-CM | POA: Diagnosis not present

## 2016-07-06 DIAGNOSIS — I509 Heart failure, unspecified: Secondary | ICD-10-CM | POA: Diagnosis not present

## 2016-07-06 DIAGNOSIS — I872 Venous insufficiency (chronic) (peripheral): Secondary | ICD-10-CM | POA: Diagnosis not present

## 2016-07-06 DIAGNOSIS — L97321 Non-pressure chronic ulcer of left ankle limited to breakdown of skin: Secondary | ICD-10-CM | POA: Diagnosis not present

## 2016-07-06 DIAGNOSIS — I739 Peripheral vascular disease, unspecified: Secondary | ICD-10-CM | POA: Diagnosis not present

## 2016-07-06 DIAGNOSIS — I110 Hypertensive heart disease with heart failure: Secondary | ICD-10-CM | POA: Diagnosis not present

## 2016-07-11 DIAGNOSIS — I509 Heart failure, unspecified: Secondary | ICD-10-CM | POA: Diagnosis not present

## 2016-07-11 DIAGNOSIS — L97811 Non-pressure chronic ulcer of other part of right lower leg limited to breakdown of skin: Secondary | ICD-10-CM | POA: Diagnosis not present

## 2016-07-11 DIAGNOSIS — I110 Hypertensive heart disease with heart failure: Secondary | ICD-10-CM | POA: Diagnosis not present

## 2016-07-11 DIAGNOSIS — L97321 Non-pressure chronic ulcer of left ankle limited to breakdown of skin: Secondary | ICD-10-CM | POA: Diagnosis not present

## 2016-07-11 DIAGNOSIS — I872 Venous insufficiency (chronic) (peripheral): Secondary | ICD-10-CM | POA: Diagnosis not present

## 2016-07-11 DIAGNOSIS — I739 Peripheral vascular disease, unspecified: Secondary | ICD-10-CM | POA: Diagnosis not present

## 2016-07-18 DIAGNOSIS — I872 Venous insufficiency (chronic) (peripheral): Secondary | ICD-10-CM | POA: Diagnosis not present

## 2016-07-18 DIAGNOSIS — I739 Peripheral vascular disease, unspecified: Secondary | ICD-10-CM | POA: Diagnosis not present

## 2016-07-18 DIAGNOSIS — I110 Hypertensive heart disease with heart failure: Secondary | ICD-10-CM | POA: Diagnosis not present

## 2016-07-18 DIAGNOSIS — I509 Heart failure, unspecified: Secondary | ICD-10-CM | POA: Diagnosis not present

## 2016-07-18 DIAGNOSIS — L97811 Non-pressure chronic ulcer of other part of right lower leg limited to breakdown of skin: Secondary | ICD-10-CM | POA: Diagnosis not present

## 2016-07-18 DIAGNOSIS — L97321 Non-pressure chronic ulcer of left ankle limited to breakdown of skin: Secondary | ICD-10-CM | POA: Diagnosis not present

## 2016-07-23 DIAGNOSIS — I110 Hypertensive heart disease with heart failure: Secondary | ICD-10-CM | POA: Diagnosis not present

## 2016-07-23 DIAGNOSIS — I509 Heart failure, unspecified: Secondary | ICD-10-CM | POA: Diagnosis not present

## 2016-07-23 DIAGNOSIS — I739 Peripheral vascular disease, unspecified: Secondary | ICD-10-CM | POA: Diagnosis not present

## 2016-07-23 DIAGNOSIS — L97321 Non-pressure chronic ulcer of left ankle limited to breakdown of skin: Secondary | ICD-10-CM | POA: Diagnosis not present

## 2016-07-23 DIAGNOSIS — L97811 Non-pressure chronic ulcer of other part of right lower leg limited to breakdown of skin: Secondary | ICD-10-CM | POA: Diagnosis not present

## 2016-07-23 DIAGNOSIS — I872 Venous insufficiency (chronic) (peripheral): Secondary | ICD-10-CM | POA: Diagnosis not present

## 2016-07-27 DIAGNOSIS — L97312 Non-pressure chronic ulcer of right ankle with fat layer exposed: Secondary | ICD-10-CM | POA: Diagnosis not present

## 2016-07-27 DIAGNOSIS — M069 Rheumatoid arthritis, unspecified: Secondary | ICD-10-CM | POA: Diagnosis not present

## 2016-07-27 DIAGNOSIS — I83223 Varicose veins of left lower extremity with both ulcer of ankle and inflammation: Secondary | ICD-10-CM | POA: Diagnosis not present

## 2016-07-27 DIAGNOSIS — I83213 Varicose veins of right lower extremity with both ulcer of ankle and inflammation: Secondary | ICD-10-CM | POA: Diagnosis not present

## 2016-07-28 DIAGNOSIS — L97321 Non-pressure chronic ulcer of left ankle limited to breakdown of skin: Secondary | ICD-10-CM | POA: Diagnosis not present

## 2016-07-28 DIAGNOSIS — I739 Peripheral vascular disease, unspecified: Secondary | ICD-10-CM | POA: Diagnosis not present

## 2016-07-28 DIAGNOSIS — L97811 Non-pressure chronic ulcer of other part of right lower leg limited to breakdown of skin: Secondary | ICD-10-CM | POA: Diagnosis not present

## 2016-07-28 DIAGNOSIS — I110 Hypertensive heart disease with heart failure: Secondary | ICD-10-CM | POA: Diagnosis not present

## 2016-07-28 DIAGNOSIS — I509 Heart failure, unspecified: Secondary | ICD-10-CM | POA: Diagnosis not present

## 2016-07-28 DIAGNOSIS — I872 Venous insufficiency (chronic) (peripheral): Secondary | ICD-10-CM | POA: Diagnosis not present

## 2016-08-01 DIAGNOSIS — I739 Peripheral vascular disease, unspecified: Secondary | ICD-10-CM | POA: Diagnosis not present

## 2016-08-01 DIAGNOSIS — I110 Hypertensive heart disease with heart failure: Secondary | ICD-10-CM | POA: Diagnosis not present

## 2016-08-01 DIAGNOSIS — L97811 Non-pressure chronic ulcer of other part of right lower leg limited to breakdown of skin: Secondary | ICD-10-CM | POA: Diagnosis not present

## 2016-08-01 DIAGNOSIS — I509 Heart failure, unspecified: Secondary | ICD-10-CM | POA: Diagnosis not present

## 2016-08-01 DIAGNOSIS — L97321 Non-pressure chronic ulcer of left ankle limited to breakdown of skin: Secondary | ICD-10-CM | POA: Diagnosis not present

## 2016-08-01 DIAGNOSIS — I872 Venous insufficiency (chronic) (peripheral): Secondary | ICD-10-CM | POA: Diagnosis not present

## 2016-08-03 DIAGNOSIS — M170 Bilateral primary osteoarthritis of knee: Secondary | ICD-10-CM | POA: Diagnosis not present

## 2016-08-03 DIAGNOSIS — M158 Other polyosteoarthritis: Secondary | ICD-10-CM | POA: Diagnosis not present

## 2016-08-03 DIAGNOSIS — I10 Essential (primary) hypertension: Secondary | ICD-10-CM | POA: Diagnosis not present

## 2016-08-08 DIAGNOSIS — L97811 Non-pressure chronic ulcer of other part of right lower leg limited to breakdown of skin: Secondary | ICD-10-CM | POA: Diagnosis not present

## 2016-08-08 DIAGNOSIS — I110 Hypertensive heart disease with heart failure: Secondary | ICD-10-CM | POA: Diagnosis not present

## 2016-08-08 DIAGNOSIS — I509 Heart failure, unspecified: Secondary | ICD-10-CM | POA: Diagnosis not present

## 2016-08-08 DIAGNOSIS — I739 Peripheral vascular disease, unspecified: Secondary | ICD-10-CM | POA: Diagnosis not present

## 2016-08-08 DIAGNOSIS — L97321 Non-pressure chronic ulcer of left ankle limited to breakdown of skin: Secondary | ICD-10-CM | POA: Diagnosis not present

## 2016-08-08 DIAGNOSIS — I872 Venous insufficiency (chronic) (peripheral): Secondary | ICD-10-CM | POA: Diagnosis not present

## 2016-08-11 DIAGNOSIS — M07672 Enteropathic arthropathies, left ankle and foot: Secondary | ICD-10-CM | POA: Diagnosis not present

## 2016-08-15 DIAGNOSIS — I739 Peripheral vascular disease, unspecified: Secondary | ICD-10-CM | POA: Diagnosis not present

## 2016-08-15 DIAGNOSIS — I509 Heart failure, unspecified: Secondary | ICD-10-CM | POA: Diagnosis not present

## 2016-08-15 DIAGNOSIS — L97321 Non-pressure chronic ulcer of left ankle limited to breakdown of skin: Secondary | ICD-10-CM | POA: Diagnosis not present

## 2016-08-15 DIAGNOSIS — L97811 Non-pressure chronic ulcer of other part of right lower leg limited to breakdown of skin: Secondary | ICD-10-CM | POA: Diagnosis not present

## 2016-08-15 DIAGNOSIS — I110 Hypertensive heart disease with heart failure: Secondary | ICD-10-CM | POA: Diagnosis not present

## 2016-08-15 DIAGNOSIS — I872 Venous insufficiency (chronic) (peripheral): Secondary | ICD-10-CM | POA: Diagnosis not present

## 2016-08-17 DIAGNOSIS — M07672 Enteropathic arthropathies, left ankle and foot: Secondary | ICD-10-CM | POA: Diagnosis not present

## 2016-08-24 DIAGNOSIS — I872 Venous insufficiency (chronic) (peripheral): Secondary | ICD-10-CM | POA: Diagnosis not present

## 2016-08-24 DIAGNOSIS — L97811 Non-pressure chronic ulcer of other part of right lower leg limited to breakdown of skin: Secondary | ICD-10-CM | POA: Diagnosis not present

## 2016-08-24 DIAGNOSIS — I110 Hypertensive heart disease with heart failure: Secondary | ICD-10-CM | POA: Diagnosis not present

## 2016-08-24 DIAGNOSIS — I739 Peripheral vascular disease, unspecified: Secondary | ICD-10-CM | POA: Diagnosis not present

## 2016-08-24 DIAGNOSIS — I509 Heart failure, unspecified: Secondary | ICD-10-CM | POA: Diagnosis not present

## 2016-08-24 DIAGNOSIS — L97321 Non-pressure chronic ulcer of left ankle limited to breakdown of skin: Secondary | ICD-10-CM | POA: Diagnosis not present

## 2016-08-28 DIAGNOSIS — M158 Other polyosteoarthritis: Secondary | ICD-10-CM | POA: Diagnosis not present

## 2016-08-28 DIAGNOSIS — I10 Essential (primary) hypertension: Secondary | ICD-10-CM | POA: Diagnosis not present

## 2016-08-29 DIAGNOSIS — I872 Venous insufficiency (chronic) (peripheral): Secondary | ICD-10-CM | POA: Diagnosis not present

## 2016-08-29 DIAGNOSIS — M79675 Pain in left toe(s): Secondary | ICD-10-CM | POA: Diagnosis not present

## 2016-08-29 DIAGNOSIS — L600 Ingrowing nail: Secondary | ICD-10-CM | POA: Diagnosis not present

## 2016-08-29 DIAGNOSIS — L84 Corns and callosities: Secondary | ICD-10-CM | POA: Diagnosis not present

## 2016-08-29 DIAGNOSIS — I110 Hypertensive heart disease with heart failure: Secondary | ICD-10-CM | POA: Diagnosis not present

## 2016-08-29 DIAGNOSIS — I509 Heart failure, unspecified: Secondary | ICD-10-CM | POA: Diagnosis not present

## 2016-08-29 DIAGNOSIS — I739 Peripheral vascular disease, unspecified: Secondary | ICD-10-CM | POA: Diagnosis not present

## 2016-08-29 DIAGNOSIS — L602 Onychogryphosis: Secondary | ICD-10-CM | POA: Diagnosis not present

## 2016-08-29 DIAGNOSIS — M79674 Pain in right toe(s): Secondary | ICD-10-CM | POA: Diagnosis not present

## 2016-08-29 DIAGNOSIS — L97321 Non-pressure chronic ulcer of left ankle limited to breakdown of skin: Secondary | ICD-10-CM | POA: Diagnosis not present

## 2016-08-29 DIAGNOSIS — L97811 Non-pressure chronic ulcer of other part of right lower leg limited to breakdown of skin: Secondary | ICD-10-CM | POA: Diagnosis not present

## 2016-08-29 DIAGNOSIS — L723 Sebaceous cyst: Secondary | ICD-10-CM | POA: Diagnosis not present

## 2016-08-29 DIAGNOSIS — B351 Tinea unguium: Secondary | ICD-10-CM | POA: Diagnosis not present

## 2016-09-04 DIAGNOSIS — L97522 Non-pressure chronic ulcer of other part of left foot with fat layer exposed: Secondary | ICD-10-CM | POA: Diagnosis not present

## 2016-09-04 DIAGNOSIS — I10 Essential (primary) hypertension: Secondary | ICD-10-CM | POA: Diagnosis not present

## 2016-09-04 DIAGNOSIS — S91302A Unspecified open wound, left foot, initial encounter: Secondary | ICD-10-CM | POA: Diagnosis not present

## 2016-09-04 DIAGNOSIS — L97312 Non-pressure chronic ulcer of right ankle with fat layer exposed: Secondary | ICD-10-CM | POA: Diagnosis not present

## 2016-09-04 DIAGNOSIS — S81801A Unspecified open wound, right lower leg, initial encounter: Secondary | ICD-10-CM | POA: Diagnosis not present

## 2016-09-04 DIAGNOSIS — L97322 Non-pressure chronic ulcer of left ankle with fat layer exposed: Secondary | ICD-10-CM | POA: Diagnosis not present

## 2016-09-04 DIAGNOSIS — M1990 Unspecified osteoarthritis, unspecified site: Secondary | ICD-10-CM | POA: Diagnosis not present

## 2016-09-04 DIAGNOSIS — Z886 Allergy status to analgesic agent status: Secondary | ICD-10-CM | POA: Diagnosis not present

## 2016-09-04 DIAGNOSIS — S91002A Unspecified open wound, left ankle, initial encounter: Secondary | ICD-10-CM | POA: Diagnosis not present

## 2016-09-04 DIAGNOSIS — L03115 Cellulitis of right lower limb: Secondary | ICD-10-CM | POA: Diagnosis not present

## 2016-09-04 DIAGNOSIS — Z880 Allergy status to penicillin: Secondary | ICD-10-CM | POA: Diagnosis not present

## 2016-09-04 DIAGNOSIS — R2689 Other abnormalities of gait and mobility: Secondary | ICD-10-CM | POA: Diagnosis not present

## 2016-09-04 DIAGNOSIS — L03116 Cellulitis of left lower limb: Secondary | ICD-10-CM | POA: Diagnosis not present

## 2016-09-04 DIAGNOSIS — M25662 Stiffness of left knee, not elsewhere classified: Secondary | ICD-10-CM | POA: Diagnosis not present

## 2016-09-06 DIAGNOSIS — I509 Heart failure, unspecified: Secondary | ICD-10-CM | POA: Diagnosis not present

## 2016-09-06 DIAGNOSIS — L97321 Non-pressure chronic ulcer of left ankle limited to breakdown of skin: Secondary | ICD-10-CM | POA: Diagnosis not present

## 2016-09-06 DIAGNOSIS — L97811 Non-pressure chronic ulcer of other part of right lower leg limited to breakdown of skin: Secondary | ICD-10-CM | POA: Diagnosis not present

## 2016-09-06 DIAGNOSIS — I110 Hypertensive heart disease with heart failure: Secondary | ICD-10-CM | POA: Diagnosis not present

## 2016-09-06 DIAGNOSIS — I739 Peripheral vascular disease, unspecified: Secondary | ICD-10-CM | POA: Diagnosis not present

## 2016-09-06 DIAGNOSIS — I872 Venous insufficiency (chronic) (peripheral): Secondary | ICD-10-CM | POA: Diagnosis not present

## 2016-09-08 DIAGNOSIS — I110 Hypertensive heart disease with heart failure: Secondary | ICD-10-CM | POA: Diagnosis not present

## 2016-09-08 DIAGNOSIS — L97811 Non-pressure chronic ulcer of other part of right lower leg limited to breakdown of skin: Secondary | ICD-10-CM | POA: Diagnosis not present

## 2016-09-08 DIAGNOSIS — I509 Heart failure, unspecified: Secondary | ICD-10-CM | POA: Diagnosis not present

## 2016-09-08 DIAGNOSIS — L97321 Non-pressure chronic ulcer of left ankle limited to breakdown of skin: Secondary | ICD-10-CM | POA: Diagnosis not present

## 2016-09-08 DIAGNOSIS — I739 Peripheral vascular disease, unspecified: Secondary | ICD-10-CM | POA: Diagnosis not present

## 2016-09-08 DIAGNOSIS — I872 Venous insufficiency (chronic) (peripheral): Secondary | ICD-10-CM | POA: Diagnosis not present

## 2016-09-11 ENCOUNTER — Encounter (HOSPITAL_BASED_OUTPATIENT_CLINIC_OR_DEPARTMENT_OTHER): Payer: Medicare Other | Attending: Internal Medicine

## 2016-09-11 DIAGNOSIS — I10 Essential (primary) hypertension: Secondary | ICD-10-CM | POA: Diagnosis not present

## 2016-09-11 DIAGNOSIS — Z886 Allergy status to analgesic agent status: Secondary | ICD-10-CM | POA: Diagnosis not present

## 2016-09-11 DIAGNOSIS — Z880 Allergy status to penicillin: Secondary | ICD-10-CM | POA: Diagnosis not present

## 2016-09-11 DIAGNOSIS — L97322 Non-pressure chronic ulcer of left ankle with fat layer exposed: Secondary | ICD-10-CM | POA: Diagnosis not present

## 2016-09-11 DIAGNOSIS — L97522 Non-pressure chronic ulcer of other part of left foot with fat layer exposed: Secondary | ICD-10-CM | POA: Diagnosis not present

## 2016-09-11 DIAGNOSIS — Z87891 Personal history of nicotine dependence: Secondary | ICD-10-CM | POA: Diagnosis not present

## 2016-09-11 DIAGNOSIS — M1990 Unspecified osteoarthritis, unspecified site: Secondary | ICD-10-CM | POA: Diagnosis not present

## 2016-09-11 DIAGNOSIS — Z79899 Other long term (current) drug therapy: Secondary | ICD-10-CM | POA: Diagnosis not present

## 2016-09-11 DIAGNOSIS — L97812 Non-pressure chronic ulcer of other part of right lower leg with fat layer exposed: Secondary | ICD-10-CM | POA: Diagnosis not present

## 2016-09-13 DIAGNOSIS — I739 Peripheral vascular disease, unspecified: Secondary | ICD-10-CM | POA: Diagnosis not present

## 2016-09-13 DIAGNOSIS — I872 Venous insufficiency (chronic) (peripheral): Secondary | ICD-10-CM | POA: Diagnosis not present

## 2016-09-13 DIAGNOSIS — I110 Hypertensive heart disease with heart failure: Secondary | ICD-10-CM | POA: Diagnosis not present

## 2016-09-13 DIAGNOSIS — L97811 Non-pressure chronic ulcer of other part of right lower leg limited to breakdown of skin: Secondary | ICD-10-CM | POA: Diagnosis not present

## 2016-09-13 DIAGNOSIS — L97321 Non-pressure chronic ulcer of left ankle limited to breakdown of skin: Secondary | ICD-10-CM | POA: Diagnosis not present

## 2016-09-13 DIAGNOSIS — I509 Heart failure, unspecified: Secondary | ICD-10-CM | POA: Diagnosis not present

## 2016-09-15 DIAGNOSIS — L97321 Non-pressure chronic ulcer of left ankle limited to breakdown of skin: Secondary | ICD-10-CM | POA: Diagnosis not present

## 2016-09-15 DIAGNOSIS — I509 Heart failure, unspecified: Secondary | ICD-10-CM | POA: Diagnosis not present

## 2016-09-15 DIAGNOSIS — I872 Venous insufficiency (chronic) (peripheral): Secondary | ICD-10-CM | POA: Diagnosis not present

## 2016-09-15 DIAGNOSIS — L97811 Non-pressure chronic ulcer of other part of right lower leg limited to breakdown of skin: Secondary | ICD-10-CM | POA: Diagnosis not present

## 2016-09-15 DIAGNOSIS — I739 Peripheral vascular disease, unspecified: Secondary | ICD-10-CM | POA: Diagnosis not present

## 2016-09-15 DIAGNOSIS — I110 Hypertensive heart disease with heart failure: Secondary | ICD-10-CM | POA: Diagnosis not present

## 2016-09-18 DIAGNOSIS — Z880 Allergy status to penicillin: Secondary | ICD-10-CM | POA: Diagnosis not present

## 2016-09-18 DIAGNOSIS — L97322 Non-pressure chronic ulcer of left ankle with fat layer exposed: Secondary | ICD-10-CM | POA: Diagnosis not present

## 2016-09-18 DIAGNOSIS — Z87891 Personal history of nicotine dependence: Secondary | ICD-10-CM | POA: Diagnosis not present

## 2016-09-18 DIAGNOSIS — I10 Essential (primary) hypertension: Secondary | ICD-10-CM | POA: Diagnosis not present

## 2016-09-18 DIAGNOSIS — M1990 Unspecified osteoarthritis, unspecified site: Secondary | ICD-10-CM | POA: Diagnosis not present

## 2016-09-18 DIAGNOSIS — Z886 Allergy status to analgesic agent status: Secondary | ICD-10-CM | POA: Diagnosis not present

## 2016-09-18 DIAGNOSIS — S90812D Abrasion, left foot, subsequent encounter: Secondary | ICD-10-CM | POA: Diagnosis not present

## 2016-09-18 DIAGNOSIS — I872 Venous insufficiency (chronic) (peripheral): Secondary | ICD-10-CM | POA: Diagnosis not present

## 2016-09-18 DIAGNOSIS — S90512D Abrasion, left ankle, subsequent encounter: Secondary | ICD-10-CM | POA: Diagnosis not present

## 2016-09-18 DIAGNOSIS — L97522 Non-pressure chronic ulcer of other part of left foot with fat layer exposed: Secondary | ICD-10-CM | POA: Diagnosis not present

## 2016-09-18 DIAGNOSIS — Z79899 Other long term (current) drug therapy: Secondary | ICD-10-CM | POA: Diagnosis not present

## 2016-09-18 DIAGNOSIS — L97812 Non-pressure chronic ulcer of other part of right lower leg with fat layer exposed: Secondary | ICD-10-CM | POA: Diagnosis not present

## 2016-09-20 DIAGNOSIS — L97321 Non-pressure chronic ulcer of left ankle limited to breakdown of skin: Secondary | ICD-10-CM | POA: Diagnosis not present

## 2016-09-20 DIAGNOSIS — I739 Peripheral vascular disease, unspecified: Secondary | ICD-10-CM | POA: Diagnosis not present

## 2016-09-20 DIAGNOSIS — I509 Heart failure, unspecified: Secondary | ICD-10-CM | POA: Diagnosis not present

## 2016-09-20 DIAGNOSIS — I110 Hypertensive heart disease with heart failure: Secondary | ICD-10-CM | POA: Diagnosis not present

## 2016-09-20 DIAGNOSIS — L97811 Non-pressure chronic ulcer of other part of right lower leg limited to breakdown of skin: Secondary | ICD-10-CM | POA: Diagnosis not present

## 2016-09-20 DIAGNOSIS — I872 Venous insufficiency (chronic) (peripheral): Secondary | ICD-10-CM | POA: Diagnosis not present

## 2016-09-21 DIAGNOSIS — L97311 Non-pressure chronic ulcer of right ankle limited to breakdown of skin: Secondary | ICD-10-CM | POA: Diagnosis not present

## 2016-09-21 DIAGNOSIS — I872 Venous insufficiency (chronic) (peripheral): Secondary | ICD-10-CM | POA: Diagnosis not present

## 2016-09-21 DIAGNOSIS — I110 Hypertensive heart disease with heart failure: Secondary | ICD-10-CM | POA: Diagnosis not present

## 2016-09-21 DIAGNOSIS — L97321 Non-pressure chronic ulcer of left ankle limited to breakdown of skin: Secondary | ICD-10-CM | POA: Diagnosis not present

## 2016-09-21 DIAGNOSIS — L97829 Non-pressure chronic ulcer of other part of left lower leg with unspecified severity: Secondary | ICD-10-CM | POA: Diagnosis not present

## 2016-09-21 DIAGNOSIS — L97813 Non-pressure chronic ulcer of other part of right lower leg with necrosis of muscle: Secondary | ICD-10-CM | POA: Diagnosis not present

## 2016-09-21 DIAGNOSIS — I509 Heart failure, unspecified: Secondary | ICD-10-CM | POA: Diagnosis not present

## 2016-09-21 DIAGNOSIS — I739 Peripheral vascular disease, unspecified: Secondary | ICD-10-CM | POA: Diagnosis not present

## 2016-09-22 DIAGNOSIS — I509 Heart failure, unspecified: Secondary | ICD-10-CM | POA: Diagnosis not present

## 2016-09-22 DIAGNOSIS — L97311 Non-pressure chronic ulcer of right ankle limited to breakdown of skin: Secondary | ICD-10-CM | POA: Diagnosis not present

## 2016-09-22 DIAGNOSIS — I872 Venous insufficiency (chronic) (peripheral): Secondary | ICD-10-CM | POA: Diagnosis not present

## 2016-09-22 DIAGNOSIS — I739 Peripheral vascular disease, unspecified: Secondary | ICD-10-CM | POA: Diagnosis not present

## 2016-09-22 DIAGNOSIS — I110 Hypertensive heart disease with heart failure: Secondary | ICD-10-CM | POA: Diagnosis not present

## 2016-09-22 DIAGNOSIS — L97321 Non-pressure chronic ulcer of left ankle limited to breakdown of skin: Secondary | ICD-10-CM | POA: Diagnosis not present

## 2016-09-26 DIAGNOSIS — I509 Heart failure, unspecified: Secondary | ICD-10-CM | POA: Diagnosis not present

## 2016-09-26 DIAGNOSIS — I110 Hypertensive heart disease with heart failure: Secondary | ICD-10-CM | POA: Diagnosis not present

## 2016-09-26 DIAGNOSIS — L97321 Non-pressure chronic ulcer of left ankle limited to breakdown of skin: Secondary | ICD-10-CM | POA: Diagnosis not present

## 2016-09-26 DIAGNOSIS — I872 Venous insufficiency (chronic) (peripheral): Secondary | ICD-10-CM | POA: Diagnosis not present

## 2016-09-26 DIAGNOSIS — L97311 Non-pressure chronic ulcer of right ankle limited to breakdown of skin: Secondary | ICD-10-CM | POA: Diagnosis not present

## 2016-09-26 DIAGNOSIS — I739 Peripheral vascular disease, unspecified: Secondary | ICD-10-CM | POA: Diagnosis not present

## 2016-09-27 DIAGNOSIS — I10 Essential (primary) hypertension: Secondary | ICD-10-CM | POA: Diagnosis not present

## 2016-09-27 DIAGNOSIS — L97812 Non-pressure chronic ulcer of other part of right lower leg with fat layer exposed: Secondary | ICD-10-CM | POA: Diagnosis not present

## 2016-09-27 DIAGNOSIS — I872 Venous insufficiency (chronic) (peripheral): Secondary | ICD-10-CM | POA: Diagnosis not present

## 2016-09-27 DIAGNOSIS — M1990 Unspecified osteoarthritis, unspecified site: Secondary | ICD-10-CM | POA: Diagnosis not present

## 2016-09-27 DIAGNOSIS — S91302A Unspecified open wound, left foot, initial encounter: Secondary | ICD-10-CM | POA: Diagnosis not present

## 2016-09-27 DIAGNOSIS — R7982 Elevated C-reactive protein (CRP): Secondary | ICD-10-CM | POA: Diagnosis not present

## 2016-09-27 DIAGNOSIS — Z79899 Other long term (current) drug therapy: Secondary | ICD-10-CM | POA: Diagnosis not present

## 2016-09-27 DIAGNOSIS — Z87891 Personal history of nicotine dependence: Secondary | ICD-10-CM | POA: Diagnosis not present

## 2016-09-27 DIAGNOSIS — S90812D Abrasion, left foot, subsequent encounter: Secondary | ICD-10-CM | POA: Diagnosis not present

## 2016-09-27 DIAGNOSIS — Z880 Allergy status to penicillin: Secondary | ICD-10-CM | POA: Diagnosis not present

## 2016-09-27 DIAGNOSIS — Z886 Allergy status to analgesic agent status: Secondary | ICD-10-CM | POA: Diagnosis not present

## 2016-09-27 DIAGNOSIS — S91002A Unspecified open wound, left ankle, initial encounter: Secondary | ICD-10-CM | POA: Diagnosis not present

## 2016-09-27 DIAGNOSIS — S90512D Abrasion, left ankle, subsequent encounter: Secondary | ICD-10-CM | POA: Diagnosis not present

## 2016-09-29 DIAGNOSIS — I509 Heart failure, unspecified: Secondary | ICD-10-CM | POA: Diagnosis not present

## 2016-09-29 DIAGNOSIS — I739 Peripheral vascular disease, unspecified: Secondary | ICD-10-CM | POA: Diagnosis not present

## 2016-09-29 DIAGNOSIS — L97311 Non-pressure chronic ulcer of right ankle limited to breakdown of skin: Secondary | ICD-10-CM | POA: Diagnosis not present

## 2016-09-29 DIAGNOSIS — I872 Venous insufficiency (chronic) (peripheral): Secondary | ICD-10-CM | POA: Diagnosis not present

## 2016-09-29 DIAGNOSIS — L97321 Non-pressure chronic ulcer of left ankle limited to breakdown of skin: Secondary | ICD-10-CM | POA: Diagnosis not present

## 2016-09-29 DIAGNOSIS — I110 Hypertensive heart disease with heart failure: Secondary | ICD-10-CM | POA: Diagnosis not present

## 2016-10-02 DIAGNOSIS — S90512D Abrasion, left ankle, subsequent encounter: Secondary | ICD-10-CM | POA: Diagnosis not present

## 2016-10-02 DIAGNOSIS — Z87891 Personal history of nicotine dependence: Secondary | ICD-10-CM | POA: Diagnosis not present

## 2016-10-02 DIAGNOSIS — S91302A Unspecified open wound, left foot, initial encounter: Secondary | ICD-10-CM | POA: Diagnosis not present

## 2016-10-02 DIAGNOSIS — Z880 Allergy status to penicillin: Secondary | ICD-10-CM | POA: Diagnosis not present

## 2016-10-02 DIAGNOSIS — Z79899 Other long term (current) drug therapy: Secondary | ICD-10-CM | POA: Diagnosis not present

## 2016-10-02 DIAGNOSIS — S91002A Unspecified open wound, left ankle, initial encounter: Secondary | ICD-10-CM | POA: Diagnosis not present

## 2016-10-02 DIAGNOSIS — I10 Essential (primary) hypertension: Secondary | ICD-10-CM | POA: Diagnosis not present

## 2016-10-02 DIAGNOSIS — M1990 Unspecified osteoarthritis, unspecified site: Secondary | ICD-10-CM | POA: Diagnosis not present

## 2016-10-02 DIAGNOSIS — S90812D Abrasion, left foot, subsequent encounter: Secondary | ICD-10-CM | POA: Diagnosis not present

## 2016-10-02 DIAGNOSIS — Z886 Allergy status to analgesic agent status: Secondary | ICD-10-CM | POA: Diagnosis not present

## 2016-10-02 DIAGNOSIS — L97812 Non-pressure chronic ulcer of other part of right lower leg with fat layer exposed: Secondary | ICD-10-CM | POA: Diagnosis not present

## 2016-10-02 DIAGNOSIS — I872 Venous insufficiency (chronic) (peripheral): Secondary | ICD-10-CM | POA: Diagnosis not present

## 2016-10-04 DIAGNOSIS — I110 Hypertensive heart disease with heart failure: Secondary | ICD-10-CM | POA: Diagnosis not present

## 2016-10-04 DIAGNOSIS — L97321 Non-pressure chronic ulcer of left ankle limited to breakdown of skin: Secondary | ICD-10-CM | POA: Diagnosis not present

## 2016-10-04 DIAGNOSIS — I872 Venous insufficiency (chronic) (peripheral): Secondary | ICD-10-CM | POA: Diagnosis not present

## 2016-10-04 DIAGNOSIS — I739 Peripheral vascular disease, unspecified: Secondary | ICD-10-CM | POA: Diagnosis not present

## 2016-10-04 DIAGNOSIS — I509 Heart failure, unspecified: Secondary | ICD-10-CM | POA: Diagnosis not present

## 2016-10-04 DIAGNOSIS — L97311 Non-pressure chronic ulcer of right ankle limited to breakdown of skin: Secondary | ICD-10-CM | POA: Diagnosis not present

## 2016-10-05 DIAGNOSIS — L97829 Non-pressure chronic ulcer of other part of left lower leg with unspecified severity: Secondary | ICD-10-CM | POA: Diagnosis not present

## 2016-10-05 DIAGNOSIS — L97813 Non-pressure chronic ulcer of other part of right lower leg with necrosis of muscle: Secondary | ICD-10-CM | POA: Diagnosis not present

## 2016-10-06 DIAGNOSIS — I509 Heart failure, unspecified: Secondary | ICD-10-CM | POA: Diagnosis not present

## 2016-10-06 DIAGNOSIS — I739 Peripheral vascular disease, unspecified: Secondary | ICD-10-CM | POA: Diagnosis not present

## 2016-10-06 DIAGNOSIS — I872 Venous insufficiency (chronic) (peripheral): Secondary | ICD-10-CM | POA: Diagnosis not present

## 2016-10-06 DIAGNOSIS — L97321 Non-pressure chronic ulcer of left ankle limited to breakdown of skin: Secondary | ICD-10-CM | POA: Diagnosis not present

## 2016-10-06 DIAGNOSIS — I110 Hypertensive heart disease with heart failure: Secondary | ICD-10-CM | POA: Diagnosis not present

## 2016-10-06 DIAGNOSIS — L97311 Non-pressure chronic ulcer of right ankle limited to breakdown of skin: Secondary | ICD-10-CM | POA: Diagnosis not present

## 2016-10-09 DIAGNOSIS — Z79899 Other long term (current) drug therapy: Secondary | ICD-10-CM | POA: Diagnosis not present

## 2016-10-09 DIAGNOSIS — L97812 Non-pressure chronic ulcer of other part of right lower leg with fat layer exposed: Secondary | ICD-10-CM | POA: Diagnosis not present

## 2016-10-09 DIAGNOSIS — I872 Venous insufficiency (chronic) (peripheral): Secondary | ICD-10-CM | POA: Diagnosis not present

## 2016-10-09 DIAGNOSIS — I10 Essential (primary) hypertension: Secondary | ICD-10-CM | POA: Diagnosis not present

## 2016-10-09 DIAGNOSIS — M8668 Other chronic osteomyelitis, other site: Secondary | ICD-10-CM | POA: Diagnosis not present

## 2016-10-09 DIAGNOSIS — Z886 Allergy status to analgesic agent status: Secondary | ICD-10-CM | POA: Diagnosis not present

## 2016-10-09 DIAGNOSIS — M1990 Unspecified osteoarthritis, unspecified site: Secondary | ICD-10-CM | POA: Diagnosis not present

## 2016-10-09 DIAGNOSIS — Z880 Allergy status to penicillin: Secondary | ICD-10-CM | POA: Diagnosis not present

## 2016-10-09 DIAGNOSIS — S90812D Abrasion, left foot, subsequent encounter: Secondary | ICD-10-CM | POA: Diagnosis not present

## 2016-10-09 DIAGNOSIS — Z87891 Personal history of nicotine dependence: Secondary | ICD-10-CM | POA: Diagnosis not present

## 2016-10-09 DIAGNOSIS — M86161 Other acute osteomyelitis, right tibia and fibula: Secondary | ICD-10-CM | POA: Diagnosis not present

## 2016-10-09 DIAGNOSIS — S90512D Abrasion, left ankle, subsequent encounter: Secondary | ICD-10-CM | POA: Diagnosis not present

## 2016-10-09 DIAGNOSIS — S91002A Unspecified open wound, left ankle, initial encounter: Secondary | ICD-10-CM | POA: Diagnosis not present

## 2016-10-09 DIAGNOSIS — S91302A Unspecified open wound, left foot, initial encounter: Secondary | ICD-10-CM | POA: Diagnosis not present

## 2016-10-11 DIAGNOSIS — M869 Osteomyelitis, unspecified: Secondary | ICD-10-CM | POA: Diagnosis not present

## 2016-10-11 DIAGNOSIS — L97909 Non-pressure chronic ulcer of unspecified part of unspecified lower leg with unspecified severity: Secondary | ICD-10-CM | POA: Diagnosis not present

## 2016-10-12 DIAGNOSIS — L97321 Non-pressure chronic ulcer of left ankle limited to breakdown of skin: Secondary | ICD-10-CM | POA: Diagnosis not present

## 2016-10-12 DIAGNOSIS — I509 Heart failure, unspecified: Secondary | ICD-10-CM | POA: Diagnosis not present

## 2016-10-12 DIAGNOSIS — I872 Venous insufficiency (chronic) (peripheral): Secondary | ICD-10-CM | POA: Diagnosis not present

## 2016-10-12 DIAGNOSIS — I739 Peripheral vascular disease, unspecified: Secondary | ICD-10-CM | POA: Diagnosis not present

## 2016-10-12 DIAGNOSIS — I110 Hypertensive heart disease with heart failure: Secondary | ICD-10-CM | POA: Diagnosis not present

## 2016-10-12 DIAGNOSIS — L97311 Non-pressure chronic ulcer of right ankle limited to breakdown of skin: Secondary | ICD-10-CM | POA: Diagnosis not present

## 2016-10-13 DIAGNOSIS — L97909 Non-pressure chronic ulcer of unspecified part of unspecified lower leg with unspecified severity: Secondary | ICD-10-CM | POA: Diagnosis not present

## 2016-10-13 DIAGNOSIS — M869 Osteomyelitis, unspecified: Secondary | ICD-10-CM | POA: Diagnosis not present

## 2016-10-13 DIAGNOSIS — D649 Anemia, unspecified: Secondary | ICD-10-CM | POA: Diagnosis not present

## 2016-10-13 DIAGNOSIS — M2550 Pain in unspecified joint: Secondary | ICD-10-CM | POA: Diagnosis not present

## 2016-10-13 DIAGNOSIS — E7800 Pure hypercholesterolemia, unspecified: Secondary | ICD-10-CM | POA: Diagnosis not present

## 2016-10-14 DIAGNOSIS — M869 Osteomyelitis, unspecified: Secondary | ICD-10-CM | POA: Diagnosis not present

## 2016-10-14 DIAGNOSIS — L97909 Non-pressure chronic ulcer of unspecified part of unspecified lower leg with unspecified severity: Secondary | ICD-10-CM | POA: Diagnosis not present

## 2016-10-15 DIAGNOSIS — M869 Osteomyelitis, unspecified: Secondary | ICD-10-CM | POA: Diagnosis not present

## 2016-10-15 DIAGNOSIS — L97909 Non-pressure chronic ulcer of unspecified part of unspecified lower leg with unspecified severity: Secondary | ICD-10-CM | POA: Diagnosis not present

## 2016-10-16 DIAGNOSIS — L84 Corns and callosities: Secondary | ICD-10-CM | POA: Diagnosis not present

## 2016-10-16 DIAGNOSIS — M86271 Subacute osteomyelitis, right ankle and foot: Secondary | ICD-10-CM | POA: Diagnosis not present

## 2016-10-16 DIAGNOSIS — I10 Essential (primary) hypertension: Secondary | ICD-10-CM | POA: Diagnosis not present

## 2016-10-16 DIAGNOSIS — J449 Chronic obstructive pulmonary disease, unspecified: Secondary | ICD-10-CM | POA: Diagnosis not present

## 2016-10-16 DIAGNOSIS — M8668 Other chronic osteomyelitis, other site: Secondary | ICD-10-CM | POA: Diagnosis not present

## 2016-10-16 DIAGNOSIS — Z886 Allergy status to analgesic agent status: Secondary | ICD-10-CM | POA: Diagnosis not present

## 2016-10-16 DIAGNOSIS — E669 Obesity, unspecified: Secondary | ICD-10-CM | POA: Diagnosis not present

## 2016-10-16 DIAGNOSIS — Z8781 Personal history of (healed) traumatic fracture: Secondary | ICD-10-CM | POA: Diagnosis not present

## 2016-10-16 DIAGNOSIS — I872 Venous insufficiency (chronic) (peripheral): Secondary | ICD-10-CM | POA: Diagnosis not present

## 2016-10-16 DIAGNOSIS — Z880 Allergy status to penicillin: Secondary | ICD-10-CM | POA: Diagnosis not present

## 2016-10-16 DIAGNOSIS — M1990 Unspecified osteoarthritis, unspecified site: Secondary | ICD-10-CM | POA: Diagnosis not present

## 2016-10-16 DIAGNOSIS — L97522 Non-pressure chronic ulcer of other part of left foot with fat layer exposed: Secondary | ICD-10-CM | POA: Diagnosis not present

## 2016-10-16 DIAGNOSIS — L97322 Non-pressure chronic ulcer of left ankle with fat layer exposed: Secondary | ICD-10-CM | POA: Diagnosis not present

## 2016-10-16 DIAGNOSIS — L97815 Non-pressure chronic ulcer of other part of right lower leg with muscle involvement without evidence of necrosis: Secondary | ICD-10-CM | POA: Diagnosis not present

## 2016-10-16 DIAGNOSIS — L97829 Non-pressure chronic ulcer of other part of left lower leg with unspecified severity: Secondary | ICD-10-CM | POA: Diagnosis not present

## 2016-10-16 DIAGNOSIS — L97813 Non-pressure chronic ulcer of other part of right lower leg with necrosis of muscle: Secondary | ICD-10-CM | POA: Diagnosis not present

## 2016-10-17 DIAGNOSIS — M869 Osteomyelitis, unspecified: Secondary | ICD-10-CM | POA: Diagnosis not present

## 2016-10-17 DIAGNOSIS — L97909 Non-pressure chronic ulcer of unspecified part of unspecified lower leg with unspecified severity: Secondary | ICD-10-CM | POA: Diagnosis not present

## 2016-10-18 DIAGNOSIS — I872 Venous insufficiency (chronic) (peripheral): Secondary | ICD-10-CM | POA: Diagnosis not present

## 2016-10-18 DIAGNOSIS — I110 Hypertensive heart disease with heart failure: Secondary | ICD-10-CM | POA: Diagnosis not present

## 2016-10-18 DIAGNOSIS — I509 Heart failure, unspecified: Secondary | ICD-10-CM | POA: Diagnosis not present

## 2016-10-18 DIAGNOSIS — L97321 Non-pressure chronic ulcer of left ankle limited to breakdown of skin: Secondary | ICD-10-CM | POA: Diagnosis not present

## 2016-10-18 DIAGNOSIS — L97909 Non-pressure chronic ulcer of unspecified part of unspecified lower leg with unspecified severity: Secondary | ICD-10-CM | POA: Diagnosis not present

## 2016-10-18 DIAGNOSIS — M869 Osteomyelitis, unspecified: Secondary | ICD-10-CM | POA: Diagnosis not present

## 2016-10-18 DIAGNOSIS — L97311 Non-pressure chronic ulcer of right ankle limited to breakdown of skin: Secondary | ICD-10-CM | POA: Diagnosis not present

## 2016-10-18 DIAGNOSIS — I739 Peripheral vascular disease, unspecified: Secondary | ICD-10-CM | POA: Diagnosis not present

## 2016-10-19 DIAGNOSIS — M86271 Subacute osteomyelitis, right ankle and foot: Secondary | ICD-10-CM | POA: Diagnosis not present

## 2016-10-19 DIAGNOSIS — L97813 Non-pressure chronic ulcer of other part of right lower leg with necrosis of muscle: Secondary | ICD-10-CM | POA: Diagnosis not present

## 2016-10-19 DIAGNOSIS — M869 Osteomyelitis, unspecified: Secondary | ICD-10-CM | POA: Diagnosis not present

## 2016-10-19 DIAGNOSIS — L97829 Non-pressure chronic ulcer of other part of left lower leg with unspecified severity: Secondary | ICD-10-CM | POA: Diagnosis not present

## 2016-10-19 DIAGNOSIS — I10 Essential (primary) hypertension: Secondary | ICD-10-CM | POA: Diagnosis not present

## 2016-10-19 DIAGNOSIS — L97909 Non-pressure chronic ulcer of unspecified part of unspecified lower leg with unspecified severity: Secondary | ICD-10-CM | POA: Diagnosis not present

## 2016-10-19 DIAGNOSIS — M158 Other polyosteoarthritis: Secondary | ICD-10-CM | POA: Diagnosis not present

## 2016-10-19 DIAGNOSIS — Z01818 Encounter for other preprocedural examination: Secondary | ICD-10-CM | POA: Diagnosis not present

## 2016-10-20 DIAGNOSIS — I509 Heart failure, unspecified: Secondary | ICD-10-CM | POA: Diagnosis not present

## 2016-10-20 DIAGNOSIS — D649 Anemia, unspecified: Secondary | ICD-10-CM | POA: Diagnosis not present

## 2016-10-20 DIAGNOSIS — I110 Hypertensive heart disease with heart failure: Secondary | ICD-10-CM | POA: Diagnosis not present

## 2016-10-20 DIAGNOSIS — M869 Osteomyelitis, unspecified: Secondary | ICD-10-CM | POA: Diagnosis not present

## 2016-10-20 DIAGNOSIS — M2550 Pain in unspecified joint: Secondary | ICD-10-CM | POA: Diagnosis not present

## 2016-10-20 DIAGNOSIS — L97321 Non-pressure chronic ulcer of left ankle limited to breakdown of skin: Secondary | ICD-10-CM | POA: Diagnosis not present

## 2016-10-20 DIAGNOSIS — I872 Venous insufficiency (chronic) (peripheral): Secondary | ICD-10-CM | POA: Diagnosis not present

## 2016-10-20 DIAGNOSIS — E7800 Pure hypercholesterolemia, unspecified: Secondary | ICD-10-CM | POA: Diagnosis not present

## 2016-10-20 DIAGNOSIS — L97909 Non-pressure chronic ulcer of unspecified part of unspecified lower leg with unspecified severity: Secondary | ICD-10-CM | POA: Diagnosis not present

## 2016-10-20 DIAGNOSIS — I739 Peripheral vascular disease, unspecified: Secondary | ICD-10-CM | POA: Diagnosis not present

## 2016-10-20 DIAGNOSIS — L97311 Non-pressure chronic ulcer of right ankle limited to breakdown of skin: Secondary | ICD-10-CM | POA: Diagnosis not present

## 2016-10-21 DIAGNOSIS — L97909 Non-pressure chronic ulcer of unspecified part of unspecified lower leg with unspecified severity: Secondary | ICD-10-CM | POA: Diagnosis not present

## 2016-10-21 DIAGNOSIS — M869 Osteomyelitis, unspecified: Secondary | ICD-10-CM | POA: Diagnosis not present

## 2016-10-22 DIAGNOSIS — M869 Osteomyelitis, unspecified: Secondary | ICD-10-CM | POA: Diagnosis not present

## 2016-10-22 DIAGNOSIS — L97909 Non-pressure chronic ulcer of unspecified part of unspecified lower leg with unspecified severity: Secondary | ICD-10-CM | POA: Diagnosis not present

## 2016-10-23 DIAGNOSIS — M86171 Other acute osteomyelitis, right ankle and foot: Secondary | ICD-10-CM | POA: Diagnosis not present

## 2016-10-23 DIAGNOSIS — Z880 Allergy status to penicillin: Secondary | ICD-10-CM | POA: Diagnosis not present

## 2016-10-23 DIAGNOSIS — S91002A Unspecified open wound, left ankle, initial encounter: Secondary | ICD-10-CM | POA: Diagnosis not present

## 2016-10-23 DIAGNOSIS — M1990 Unspecified osteoarthritis, unspecified site: Secondary | ICD-10-CM | POA: Diagnosis not present

## 2016-10-23 DIAGNOSIS — S91302A Unspecified open wound, left foot, initial encounter: Secondary | ICD-10-CM | POA: Diagnosis not present

## 2016-10-23 DIAGNOSIS — M869 Osteomyelitis, unspecified: Secondary | ICD-10-CM | POA: Diagnosis not present

## 2016-10-23 DIAGNOSIS — Z886 Allergy status to analgesic agent status: Secondary | ICD-10-CM | POA: Diagnosis not present

## 2016-10-23 DIAGNOSIS — S90812D Abrasion, left foot, subsequent encounter: Secondary | ICD-10-CM | POA: Diagnosis not present

## 2016-10-23 DIAGNOSIS — S90512D Abrasion, left ankle, subsequent encounter: Secondary | ICD-10-CM | POA: Diagnosis not present

## 2016-10-23 DIAGNOSIS — Z79899 Other long term (current) drug therapy: Secondary | ICD-10-CM | POA: Diagnosis not present

## 2016-10-23 DIAGNOSIS — I10 Essential (primary) hypertension: Secondary | ICD-10-CM | POA: Diagnosis not present

## 2016-10-23 DIAGNOSIS — L97909 Non-pressure chronic ulcer of unspecified part of unspecified lower leg with unspecified severity: Secondary | ICD-10-CM | POA: Diagnosis not present

## 2016-10-23 DIAGNOSIS — Z993 Dependence on wheelchair: Secondary | ICD-10-CM | POA: Diagnosis not present

## 2016-10-23 DIAGNOSIS — Z87891 Personal history of nicotine dependence: Secondary | ICD-10-CM | POA: Diagnosis not present

## 2016-10-23 DIAGNOSIS — L97312 Non-pressure chronic ulcer of right ankle with fat layer exposed: Secondary | ICD-10-CM | POA: Diagnosis not present

## 2016-10-23 DIAGNOSIS — I872 Venous insufficiency (chronic) (peripheral): Secondary | ICD-10-CM | POA: Diagnosis not present

## 2016-10-23 DIAGNOSIS — L97812 Non-pressure chronic ulcer of other part of right lower leg with fat layer exposed: Secondary | ICD-10-CM | POA: Diagnosis not present

## 2016-10-24 DIAGNOSIS — M869 Osteomyelitis, unspecified: Secondary | ICD-10-CM | POA: Diagnosis not present

## 2016-10-24 DIAGNOSIS — L97909 Non-pressure chronic ulcer of unspecified part of unspecified lower leg with unspecified severity: Secondary | ICD-10-CM | POA: Diagnosis not present

## 2016-10-25 DIAGNOSIS — L97321 Non-pressure chronic ulcer of left ankle limited to breakdown of skin: Secondary | ICD-10-CM | POA: Diagnosis not present

## 2016-10-25 DIAGNOSIS — I739 Peripheral vascular disease, unspecified: Secondary | ICD-10-CM | POA: Diagnosis not present

## 2016-10-25 DIAGNOSIS — L97909 Non-pressure chronic ulcer of unspecified part of unspecified lower leg with unspecified severity: Secondary | ICD-10-CM | POA: Diagnosis not present

## 2016-10-25 DIAGNOSIS — I872 Venous insufficiency (chronic) (peripheral): Secondary | ICD-10-CM | POA: Diagnosis not present

## 2016-10-25 DIAGNOSIS — M869 Osteomyelitis, unspecified: Secondary | ICD-10-CM | POA: Diagnosis not present

## 2016-10-25 DIAGNOSIS — L97311 Non-pressure chronic ulcer of right ankle limited to breakdown of skin: Secondary | ICD-10-CM | POA: Diagnosis not present

## 2016-10-25 DIAGNOSIS — I110 Hypertensive heart disease with heart failure: Secondary | ICD-10-CM | POA: Diagnosis not present

## 2016-10-25 DIAGNOSIS — I509 Heart failure, unspecified: Secondary | ICD-10-CM | POA: Diagnosis not present

## 2016-10-26 DIAGNOSIS — L97909 Non-pressure chronic ulcer of unspecified part of unspecified lower leg with unspecified severity: Secondary | ICD-10-CM | POA: Diagnosis not present

## 2016-10-26 DIAGNOSIS — M869 Osteomyelitis, unspecified: Secondary | ICD-10-CM | POA: Diagnosis not present

## 2016-10-27 DIAGNOSIS — I509 Heart failure, unspecified: Secondary | ICD-10-CM | POA: Diagnosis not present

## 2016-10-27 DIAGNOSIS — I872 Venous insufficiency (chronic) (peripheral): Secondary | ICD-10-CM | POA: Diagnosis not present

## 2016-10-27 DIAGNOSIS — M2550 Pain in unspecified joint: Secondary | ICD-10-CM | POA: Diagnosis not present

## 2016-10-27 DIAGNOSIS — M869 Osteomyelitis, unspecified: Secondary | ICD-10-CM | POA: Diagnosis not present

## 2016-10-27 DIAGNOSIS — I739 Peripheral vascular disease, unspecified: Secondary | ICD-10-CM | POA: Diagnosis not present

## 2016-10-27 DIAGNOSIS — I110 Hypertensive heart disease with heart failure: Secondary | ICD-10-CM | POA: Diagnosis not present

## 2016-10-27 DIAGNOSIS — E7800 Pure hypercholesterolemia, unspecified: Secondary | ICD-10-CM | POA: Diagnosis not present

## 2016-10-27 DIAGNOSIS — D649 Anemia, unspecified: Secondary | ICD-10-CM | POA: Diagnosis not present

## 2016-10-27 DIAGNOSIS — L97311 Non-pressure chronic ulcer of right ankle limited to breakdown of skin: Secondary | ICD-10-CM | POA: Diagnosis not present

## 2016-10-27 DIAGNOSIS — L97321 Non-pressure chronic ulcer of left ankle limited to breakdown of skin: Secondary | ICD-10-CM | POA: Diagnosis not present

## 2016-10-27 DIAGNOSIS — L97909 Non-pressure chronic ulcer of unspecified part of unspecified lower leg with unspecified severity: Secondary | ICD-10-CM | POA: Diagnosis not present

## 2016-10-28 DIAGNOSIS — L97909 Non-pressure chronic ulcer of unspecified part of unspecified lower leg with unspecified severity: Secondary | ICD-10-CM | POA: Diagnosis not present

## 2016-10-28 DIAGNOSIS — M869 Osteomyelitis, unspecified: Secondary | ICD-10-CM | POA: Diagnosis not present

## 2016-10-29 DIAGNOSIS — L97909 Non-pressure chronic ulcer of unspecified part of unspecified lower leg with unspecified severity: Secondary | ICD-10-CM | POA: Diagnosis not present

## 2016-10-29 DIAGNOSIS — M869 Osteomyelitis, unspecified: Secondary | ICD-10-CM | POA: Diagnosis not present

## 2016-10-30 DIAGNOSIS — S90812A Abrasion, left foot, initial encounter: Secondary | ICD-10-CM | POA: Diagnosis not present

## 2016-10-30 DIAGNOSIS — S90812D Abrasion, left foot, subsequent encounter: Secondary | ICD-10-CM | POA: Diagnosis not present

## 2016-10-30 DIAGNOSIS — I872 Venous insufficiency (chronic) (peripheral): Secondary | ICD-10-CM | POA: Diagnosis not present

## 2016-10-30 DIAGNOSIS — E7800 Pure hypercholesterolemia, unspecified: Secondary | ICD-10-CM | POA: Diagnosis not present

## 2016-10-30 DIAGNOSIS — D649 Anemia, unspecified: Secondary | ICD-10-CM | POA: Diagnosis not present

## 2016-10-30 DIAGNOSIS — M86171 Other acute osteomyelitis, right ankle and foot: Secondary | ICD-10-CM | POA: Diagnosis not present

## 2016-10-30 DIAGNOSIS — L97909 Non-pressure chronic ulcer of unspecified part of unspecified lower leg with unspecified severity: Secondary | ICD-10-CM | POA: Diagnosis not present

## 2016-10-30 DIAGNOSIS — S90512D Abrasion, left ankle, subsequent encounter: Secondary | ICD-10-CM | POA: Diagnosis not present

## 2016-10-30 DIAGNOSIS — Z880 Allergy status to penicillin: Secondary | ICD-10-CM | POA: Diagnosis not present

## 2016-10-30 DIAGNOSIS — S90512A Abrasion, left ankle, initial encounter: Secondary | ICD-10-CM | POA: Diagnosis not present

## 2016-10-30 DIAGNOSIS — M2550 Pain in unspecified joint: Secondary | ICD-10-CM | POA: Diagnosis not present

## 2016-10-30 DIAGNOSIS — M8618 Other acute osteomyelitis, other site: Secondary | ICD-10-CM | POA: Diagnosis not present

## 2016-10-30 DIAGNOSIS — Z886 Allergy status to analgesic agent status: Secondary | ICD-10-CM | POA: Diagnosis not present

## 2016-10-30 DIAGNOSIS — M1990 Unspecified osteoarthritis, unspecified site: Secondary | ICD-10-CM | POA: Diagnosis not present

## 2016-10-30 DIAGNOSIS — L97312 Non-pressure chronic ulcer of right ankle with fat layer exposed: Secondary | ICD-10-CM | POA: Diagnosis not present

## 2016-10-30 DIAGNOSIS — L97812 Non-pressure chronic ulcer of other part of right lower leg with fat layer exposed: Secondary | ICD-10-CM | POA: Diagnosis not present

## 2016-10-30 DIAGNOSIS — Z87891 Personal history of nicotine dependence: Secondary | ICD-10-CM | POA: Diagnosis not present

## 2016-10-30 DIAGNOSIS — I10 Essential (primary) hypertension: Secondary | ICD-10-CM | POA: Diagnosis not present

## 2016-10-30 DIAGNOSIS — M869 Osteomyelitis, unspecified: Secondary | ICD-10-CM | POA: Diagnosis not present

## 2016-10-31 DIAGNOSIS — L97909 Non-pressure chronic ulcer of unspecified part of unspecified lower leg with unspecified severity: Secondary | ICD-10-CM | POA: Diagnosis not present

## 2016-10-31 DIAGNOSIS — M869 Osteomyelitis, unspecified: Secondary | ICD-10-CM | POA: Diagnosis not present

## 2016-11-01 DIAGNOSIS — M869 Osteomyelitis, unspecified: Secondary | ICD-10-CM | POA: Diagnosis not present

## 2016-11-01 DIAGNOSIS — I739 Peripheral vascular disease, unspecified: Secondary | ICD-10-CM | POA: Diagnosis not present

## 2016-11-01 DIAGNOSIS — L97909 Non-pressure chronic ulcer of unspecified part of unspecified lower leg with unspecified severity: Secondary | ICD-10-CM | POA: Diagnosis not present

## 2016-11-01 DIAGNOSIS — I110 Hypertensive heart disease with heart failure: Secondary | ICD-10-CM | POA: Diagnosis not present

## 2016-11-01 DIAGNOSIS — I509 Heart failure, unspecified: Secondary | ICD-10-CM | POA: Diagnosis not present

## 2016-11-01 DIAGNOSIS — I872 Venous insufficiency (chronic) (peripheral): Secondary | ICD-10-CM | POA: Diagnosis not present

## 2016-11-01 DIAGNOSIS — L97321 Non-pressure chronic ulcer of left ankle limited to breakdown of skin: Secondary | ICD-10-CM | POA: Diagnosis not present

## 2016-11-01 DIAGNOSIS — L97311 Non-pressure chronic ulcer of right ankle limited to breakdown of skin: Secondary | ICD-10-CM | POA: Diagnosis not present

## 2016-11-02 DIAGNOSIS — S90512D Abrasion, left ankle, subsequent encounter: Secondary | ICD-10-CM | POA: Diagnosis not present

## 2016-11-02 DIAGNOSIS — L97909 Non-pressure chronic ulcer of unspecified part of unspecified lower leg with unspecified severity: Secondary | ICD-10-CM | POA: Diagnosis not present

## 2016-11-02 DIAGNOSIS — M1990 Unspecified osteoarthritis, unspecified site: Secondary | ICD-10-CM | POA: Diagnosis not present

## 2016-11-02 DIAGNOSIS — Z79899 Other long term (current) drug therapy: Secondary | ICD-10-CM | POA: Diagnosis not present

## 2016-11-02 DIAGNOSIS — Z886 Allergy status to analgesic agent status: Secondary | ICD-10-CM | POA: Diagnosis not present

## 2016-11-02 DIAGNOSIS — Z880 Allergy status to penicillin: Secondary | ICD-10-CM | POA: Diagnosis not present

## 2016-11-02 DIAGNOSIS — S90812D Abrasion, left foot, subsequent encounter: Secondary | ICD-10-CM | POA: Diagnosis not present

## 2016-11-02 DIAGNOSIS — L97812 Non-pressure chronic ulcer of other part of right lower leg with fat layer exposed: Secondary | ICD-10-CM | POA: Diagnosis not present

## 2016-11-02 DIAGNOSIS — I10 Essential (primary) hypertension: Secondary | ICD-10-CM | POA: Diagnosis not present

## 2016-11-02 DIAGNOSIS — M869 Osteomyelitis, unspecified: Secondary | ICD-10-CM | POA: Diagnosis not present

## 2016-11-03 DIAGNOSIS — L97909 Non-pressure chronic ulcer of unspecified part of unspecified lower leg with unspecified severity: Secondary | ICD-10-CM | POA: Diagnosis not present

## 2016-11-03 DIAGNOSIS — M869 Osteomyelitis, unspecified: Secondary | ICD-10-CM | POA: Diagnosis not present

## 2016-11-04 DIAGNOSIS — M869 Osteomyelitis, unspecified: Secondary | ICD-10-CM | POA: Diagnosis not present

## 2016-11-04 DIAGNOSIS — L97909 Non-pressure chronic ulcer of unspecified part of unspecified lower leg with unspecified severity: Secondary | ICD-10-CM | POA: Diagnosis not present

## 2016-11-05 DIAGNOSIS — L97909 Non-pressure chronic ulcer of unspecified part of unspecified lower leg with unspecified severity: Secondary | ICD-10-CM | POA: Diagnosis not present

## 2016-11-05 DIAGNOSIS — M869 Osteomyelitis, unspecified: Secondary | ICD-10-CM | POA: Diagnosis not present

## 2016-11-06 DIAGNOSIS — M1990 Unspecified osteoarthritis, unspecified site: Secondary | ICD-10-CM | POA: Diagnosis not present

## 2016-11-06 DIAGNOSIS — M8668 Other chronic osteomyelitis, other site: Secondary | ICD-10-CM | POA: Diagnosis not present

## 2016-11-06 DIAGNOSIS — D649 Anemia, unspecified: Secondary | ICD-10-CM | POA: Diagnosis not present

## 2016-11-06 DIAGNOSIS — I10 Essential (primary) hypertension: Secondary | ICD-10-CM | POA: Diagnosis not present

## 2016-11-06 DIAGNOSIS — L97812 Non-pressure chronic ulcer of other part of right lower leg with fat layer exposed: Secondary | ICD-10-CM | POA: Diagnosis not present

## 2016-11-06 DIAGNOSIS — E7800 Pure hypercholesterolemia, unspecified: Secondary | ICD-10-CM | POA: Diagnosis not present

## 2016-11-06 DIAGNOSIS — Z886 Allergy status to analgesic agent status: Secondary | ICD-10-CM | POA: Diagnosis not present

## 2016-11-06 DIAGNOSIS — L97322 Non-pressure chronic ulcer of left ankle with fat layer exposed: Secondary | ICD-10-CM | POA: Diagnosis not present

## 2016-11-06 DIAGNOSIS — M869 Osteomyelitis, unspecified: Secondary | ICD-10-CM | POA: Diagnosis not present

## 2016-11-06 DIAGNOSIS — S90812D Abrasion, left foot, subsequent encounter: Secondary | ICD-10-CM | POA: Diagnosis not present

## 2016-11-06 DIAGNOSIS — Z87891 Personal history of nicotine dependence: Secondary | ICD-10-CM | POA: Diagnosis not present

## 2016-11-06 DIAGNOSIS — I872 Venous insufficiency (chronic) (peripheral): Secondary | ICD-10-CM | POA: Diagnosis not present

## 2016-11-06 DIAGNOSIS — M2550 Pain in unspecified joint: Secondary | ICD-10-CM | POA: Diagnosis not present

## 2016-11-06 DIAGNOSIS — L97909 Non-pressure chronic ulcer of unspecified part of unspecified lower leg with unspecified severity: Secondary | ICD-10-CM | POA: Diagnosis not present

## 2016-11-06 DIAGNOSIS — M86171 Other acute osteomyelitis, right ankle and foot: Secondary | ICD-10-CM | POA: Diagnosis not present

## 2016-11-06 DIAGNOSIS — L97312 Non-pressure chronic ulcer of right ankle with fat layer exposed: Secondary | ICD-10-CM | POA: Diagnosis not present

## 2016-11-06 DIAGNOSIS — Z880 Allergy status to penicillin: Secondary | ICD-10-CM | POA: Diagnosis not present

## 2016-11-06 DIAGNOSIS — S90512D Abrasion, left ankle, subsequent encounter: Secondary | ICD-10-CM | POA: Diagnosis not present

## 2016-11-07 DIAGNOSIS — L97909 Non-pressure chronic ulcer of unspecified part of unspecified lower leg with unspecified severity: Secondary | ICD-10-CM | POA: Diagnosis not present

## 2016-11-07 DIAGNOSIS — M869 Osteomyelitis, unspecified: Secondary | ICD-10-CM | POA: Diagnosis not present

## 2016-11-07 DIAGNOSIS — Z1231 Encounter for screening mammogram for malignant neoplasm of breast: Secondary | ICD-10-CM | POA: Diagnosis not present

## 2016-11-08 DIAGNOSIS — I872 Venous insufficiency (chronic) (peripheral): Secondary | ICD-10-CM | POA: Diagnosis not present

## 2016-11-08 DIAGNOSIS — I509 Heart failure, unspecified: Secondary | ICD-10-CM | POA: Diagnosis not present

## 2016-11-08 DIAGNOSIS — I739 Peripheral vascular disease, unspecified: Secondary | ICD-10-CM | POA: Diagnosis not present

## 2016-11-08 DIAGNOSIS — L97909 Non-pressure chronic ulcer of unspecified part of unspecified lower leg with unspecified severity: Secondary | ICD-10-CM | POA: Diagnosis not present

## 2016-11-08 DIAGNOSIS — M869 Osteomyelitis, unspecified: Secondary | ICD-10-CM | POA: Diagnosis not present

## 2016-11-08 DIAGNOSIS — I110 Hypertensive heart disease with heart failure: Secondary | ICD-10-CM | POA: Diagnosis not present

## 2016-11-08 DIAGNOSIS — L97311 Non-pressure chronic ulcer of right ankle limited to breakdown of skin: Secondary | ICD-10-CM | POA: Diagnosis not present

## 2016-11-08 DIAGNOSIS — L97321 Non-pressure chronic ulcer of left ankle limited to breakdown of skin: Secondary | ICD-10-CM | POA: Diagnosis not present

## 2016-11-09 DIAGNOSIS — L600 Ingrowing nail: Secondary | ICD-10-CM | POA: Diagnosis not present

## 2016-11-09 DIAGNOSIS — L602 Onychogryphosis: Secondary | ICD-10-CM | POA: Diagnosis not present

## 2016-11-09 DIAGNOSIS — M869 Osteomyelitis, unspecified: Secondary | ICD-10-CM | POA: Diagnosis not present

## 2016-11-09 DIAGNOSIS — M79675 Pain in left toe(s): Secondary | ICD-10-CM | POA: Diagnosis not present

## 2016-11-09 DIAGNOSIS — L97909 Non-pressure chronic ulcer of unspecified part of unspecified lower leg with unspecified severity: Secondary | ICD-10-CM | POA: Diagnosis not present

## 2016-11-09 DIAGNOSIS — B351 Tinea unguium: Secondary | ICD-10-CM | POA: Diagnosis not present

## 2016-11-09 DIAGNOSIS — M79674 Pain in right toe(s): Secondary | ICD-10-CM | POA: Diagnosis not present

## 2016-11-09 DIAGNOSIS — I739 Peripheral vascular disease, unspecified: Secondary | ICD-10-CM | POA: Diagnosis not present

## 2016-11-09 DIAGNOSIS — L84 Corns and callosities: Secondary | ICD-10-CM | POA: Diagnosis not present

## 2016-11-10 DIAGNOSIS — M869 Osteomyelitis, unspecified: Secondary | ICD-10-CM | POA: Diagnosis not present

## 2016-11-10 DIAGNOSIS — I110 Hypertensive heart disease with heart failure: Secondary | ICD-10-CM | POA: Diagnosis not present

## 2016-11-10 DIAGNOSIS — L97311 Non-pressure chronic ulcer of right ankle limited to breakdown of skin: Secondary | ICD-10-CM | POA: Diagnosis not present

## 2016-11-10 DIAGNOSIS — L97321 Non-pressure chronic ulcer of left ankle limited to breakdown of skin: Secondary | ICD-10-CM | POA: Diagnosis not present

## 2016-11-10 DIAGNOSIS — M2550 Pain in unspecified joint: Secondary | ICD-10-CM | POA: Diagnosis not present

## 2016-11-10 DIAGNOSIS — L97909 Non-pressure chronic ulcer of unspecified part of unspecified lower leg with unspecified severity: Secondary | ICD-10-CM | POA: Diagnosis not present

## 2016-11-10 DIAGNOSIS — I509 Heart failure, unspecified: Secondary | ICD-10-CM | POA: Diagnosis not present

## 2016-11-10 DIAGNOSIS — D649 Anemia, unspecified: Secondary | ICD-10-CM | POA: Diagnosis not present

## 2016-11-10 DIAGNOSIS — I872 Venous insufficiency (chronic) (peripheral): Secondary | ICD-10-CM | POA: Diagnosis not present

## 2016-11-10 DIAGNOSIS — I739 Peripheral vascular disease, unspecified: Secondary | ICD-10-CM | POA: Diagnosis not present

## 2016-11-10 DIAGNOSIS — E7800 Pure hypercholesterolemia, unspecified: Secondary | ICD-10-CM | POA: Diagnosis not present

## 2016-11-11 DIAGNOSIS — L97909 Non-pressure chronic ulcer of unspecified part of unspecified lower leg with unspecified severity: Secondary | ICD-10-CM | POA: Diagnosis not present

## 2016-11-11 DIAGNOSIS — M869 Osteomyelitis, unspecified: Secondary | ICD-10-CM | POA: Diagnosis not present

## 2016-11-12 DIAGNOSIS — M869 Osteomyelitis, unspecified: Secondary | ICD-10-CM | POA: Diagnosis not present

## 2016-11-12 DIAGNOSIS — L97909 Non-pressure chronic ulcer of unspecified part of unspecified lower leg with unspecified severity: Secondary | ICD-10-CM | POA: Diagnosis not present

## 2016-11-13 DIAGNOSIS — Z880 Allergy status to penicillin: Secondary | ICD-10-CM | POA: Diagnosis not present

## 2016-11-13 DIAGNOSIS — L97312 Non-pressure chronic ulcer of right ankle with fat layer exposed: Secondary | ICD-10-CM | POA: Diagnosis not present

## 2016-11-13 DIAGNOSIS — Z23 Encounter for immunization: Secondary | ICD-10-CM | POA: Diagnosis not present

## 2016-11-13 DIAGNOSIS — L97812 Non-pressure chronic ulcer of other part of right lower leg with fat layer exposed: Secondary | ICD-10-CM | POA: Diagnosis not present

## 2016-11-13 DIAGNOSIS — Z79899 Other long term (current) drug therapy: Secondary | ICD-10-CM | POA: Diagnosis not present

## 2016-11-13 DIAGNOSIS — M1990 Unspecified osteoarthritis, unspecified site: Secondary | ICD-10-CM | POA: Diagnosis not present

## 2016-11-13 DIAGNOSIS — Z886 Allergy status to analgesic agent status: Secondary | ICD-10-CM | POA: Diagnosis not present

## 2016-11-13 DIAGNOSIS — S90512D Abrasion, left ankle, subsequent encounter: Secondary | ICD-10-CM | POA: Diagnosis not present

## 2016-11-13 DIAGNOSIS — M8668 Other chronic osteomyelitis, other site: Secondary | ICD-10-CM | POA: Diagnosis not present

## 2016-11-13 DIAGNOSIS — I872 Venous insufficiency (chronic) (peripheral): Secondary | ICD-10-CM | POA: Diagnosis not present

## 2016-11-13 DIAGNOSIS — S91002A Unspecified open wound, left ankle, initial encounter: Secondary | ICD-10-CM | POA: Diagnosis not present

## 2016-11-13 DIAGNOSIS — M86171 Other acute osteomyelitis, right ankle and foot: Secondary | ICD-10-CM | POA: Diagnosis not present

## 2016-11-13 DIAGNOSIS — S90812D Abrasion, left foot, subsequent encounter: Secondary | ICD-10-CM | POA: Diagnosis not present

## 2016-11-13 DIAGNOSIS — I10 Essential (primary) hypertension: Secondary | ICD-10-CM | POA: Diagnosis not present

## 2016-11-13 DIAGNOSIS — Z87891 Personal history of nicotine dependence: Secondary | ICD-10-CM | POA: Diagnosis not present

## 2016-11-13 DIAGNOSIS — M869 Osteomyelitis, unspecified: Secondary | ICD-10-CM | POA: Diagnosis not present

## 2016-11-13 DIAGNOSIS — L97909 Non-pressure chronic ulcer of unspecified part of unspecified lower leg with unspecified severity: Secondary | ICD-10-CM | POA: Diagnosis not present

## 2016-11-14 DIAGNOSIS — L97909 Non-pressure chronic ulcer of unspecified part of unspecified lower leg with unspecified severity: Secondary | ICD-10-CM | POA: Diagnosis not present

## 2016-11-14 DIAGNOSIS — M869 Osteomyelitis, unspecified: Secondary | ICD-10-CM | POA: Diagnosis not present

## 2016-11-15 DIAGNOSIS — I739 Peripheral vascular disease, unspecified: Secondary | ICD-10-CM | POA: Diagnosis not present

## 2016-11-15 DIAGNOSIS — L97909 Non-pressure chronic ulcer of unspecified part of unspecified lower leg with unspecified severity: Secondary | ICD-10-CM | POA: Diagnosis not present

## 2016-11-15 DIAGNOSIS — M869 Osteomyelitis, unspecified: Secondary | ICD-10-CM | POA: Diagnosis not present

## 2016-11-15 DIAGNOSIS — L97321 Non-pressure chronic ulcer of left ankle limited to breakdown of skin: Secondary | ICD-10-CM | POA: Diagnosis not present

## 2016-11-15 DIAGNOSIS — L97311 Non-pressure chronic ulcer of right ankle limited to breakdown of skin: Secondary | ICD-10-CM | POA: Diagnosis not present

## 2016-11-15 DIAGNOSIS — I509 Heart failure, unspecified: Secondary | ICD-10-CM | POA: Diagnosis not present

## 2016-11-15 DIAGNOSIS — I110 Hypertensive heart disease with heart failure: Secondary | ICD-10-CM | POA: Diagnosis not present

## 2016-11-15 DIAGNOSIS — I872 Venous insufficiency (chronic) (peripheral): Secondary | ICD-10-CM | POA: Diagnosis not present

## 2016-11-16 DIAGNOSIS — G4709 Other insomnia: Secondary | ICD-10-CM | POA: Diagnosis not present

## 2016-11-16 DIAGNOSIS — L97909 Non-pressure chronic ulcer of unspecified part of unspecified lower leg with unspecified severity: Secondary | ICD-10-CM | POA: Diagnosis not present

## 2016-11-16 DIAGNOSIS — M07672 Enteropathic arthropathies, left ankle and foot: Secondary | ICD-10-CM | POA: Diagnosis not present

## 2016-11-16 DIAGNOSIS — M869 Osteomyelitis, unspecified: Secondary | ICD-10-CM | POA: Diagnosis not present

## 2016-11-16 DIAGNOSIS — M170 Bilateral primary osteoarthritis of knee: Secondary | ICD-10-CM | POA: Diagnosis not present

## 2016-11-16 DIAGNOSIS — I10 Essential (primary) hypertension: Secondary | ICD-10-CM | POA: Diagnosis not present

## 2016-11-17 DIAGNOSIS — D649 Anemia, unspecified: Secondary | ICD-10-CM | POA: Diagnosis not present

## 2016-11-17 DIAGNOSIS — I872 Venous insufficiency (chronic) (peripheral): Secondary | ICD-10-CM | POA: Diagnosis not present

## 2016-11-17 DIAGNOSIS — M170 Bilateral primary osteoarthritis of knee: Secondary | ICD-10-CM | POA: Diagnosis not present

## 2016-11-17 DIAGNOSIS — I10 Essential (primary) hypertension: Secondary | ICD-10-CM | POA: Diagnosis not present

## 2016-11-17 DIAGNOSIS — M2550 Pain in unspecified joint: Secondary | ICD-10-CM | POA: Diagnosis not present

## 2016-11-17 DIAGNOSIS — M869 Osteomyelitis, unspecified: Secondary | ICD-10-CM | POA: Diagnosis not present

## 2016-11-17 DIAGNOSIS — L97909 Non-pressure chronic ulcer of unspecified part of unspecified lower leg with unspecified severity: Secondary | ICD-10-CM | POA: Diagnosis not present

## 2016-11-17 DIAGNOSIS — I509 Heart failure, unspecified: Secondary | ICD-10-CM | POA: Diagnosis not present

## 2016-11-17 DIAGNOSIS — L97321 Non-pressure chronic ulcer of left ankle limited to breakdown of skin: Secondary | ICD-10-CM | POA: Diagnosis not present

## 2016-11-17 DIAGNOSIS — E7800 Pure hypercholesterolemia, unspecified: Secondary | ICD-10-CM | POA: Diagnosis not present

## 2016-11-17 DIAGNOSIS — I110 Hypertensive heart disease with heart failure: Secondary | ICD-10-CM | POA: Diagnosis not present

## 2016-11-17 DIAGNOSIS — L97311 Non-pressure chronic ulcer of right ankle limited to breakdown of skin: Secondary | ICD-10-CM | POA: Diagnosis not present

## 2016-11-17 DIAGNOSIS — I739 Peripheral vascular disease, unspecified: Secondary | ICD-10-CM | POA: Diagnosis not present

## 2016-11-18 DIAGNOSIS — M869 Osteomyelitis, unspecified: Secondary | ICD-10-CM | POA: Diagnosis not present

## 2016-11-18 DIAGNOSIS — L97909 Non-pressure chronic ulcer of unspecified part of unspecified lower leg with unspecified severity: Secondary | ICD-10-CM | POA: Diagnosis not present

## 2016-11-19 DIAGNOSIS — M869 Osteomyelitis, unspecified: Secondary | ICD-10-CM | POA: Diagnosis not present

## 2016-11-19 DIAGNOSIS — L97909 Non-pressure chronic ulcer of unspecified part of unspecified lower leg with unspecified severity: Secondary | ICD-10-CM | POA: Diagnosis not present

## 2016-11-20 DIAGNOSIS — M86171 Other acute osteomyelitis, right ankle and foot: Secondary | ICD-10-CM | POA: Diagnosis not present

## 2016-11-20 DIAGNOSIS — L97521 Non-pressure chronic ulcer of other part of left foot limited to breakdown of skin: Secondary | ICD-10-CM | POA: Diagnosis not present

## 2016-11-20 DIAGNOSIS — S90812D Abrasion, left foot, subsequent encounter: Secondary | ICD-10-CM | POA: Diagnosis not present

## 2016-11-20 DIAGNOSIS — L97311 Non-pressure chronic ulcer of right ankle limited to breakdown of skin: Secondary | ICD-10-CM | POA: Diagnosis not present

## 2016-11-20 DIAGNOSIS — L97909 Non-pressure chronic ulcer of unspecified part of unspecified lower leg with unspecified severity: Secondary | ICD-10-CM | POA: Diagnosis not present

## 2016-11-20 DIAGNOSIS — I872 Venous insufficiency (chronic) (peripheral): Secondary | ICD-10-CM | POA: Diagnosis not present

## 2016-11-20 DIAGNOSIS — L97321 Non-pressure chronic ulcer of left ankle limited to breakdown of skin: Secondary | ICD-10-CM | POA: Diagnosis not present

## 2016-11-20 DIAGNOSIS — S90812A Abrasion, left foot, initial encounter: Secondary | ICD-10-CM | POA: Diagnosis not present

## 2016-11-20 DIAGNOSIS — I509 Heart failure, unspecified: Secondary | ICD-10-CM | POA: Diagnosis not present

## 2016-11-20 DIAGNOSIS — S90512D Abrasion, left ankle, subsequent encounter: Secondary | ICD-10-CM | POA: Diagnosis not present

## 2016-11-20 DIAGNOSIS — Z886 Allergy status to analgesic agent status: Secondary | ICD-10-CM | POA: Diagnosis not present

## 2016-11-20 DIAGNOSIS — I110 Hypertensive heart disease with heart failure: Secondary | ICD-10-CM | POA: Diagnosis not present

## 2016-11-20 DIAGNOSIS — Z87891 Personal history of nicotine dependence: Secondary | ICD-10-CM | POA: Diagnosis not present

## 2016-11-20 DIAGNOSIS — M8668 Other chronic osteomyelitis, other site: Secondary | ICD-10-CM | POA: Diagnosis not present

## 2016-11-20 DIAGNOSIS — M869 Osteomyelitis, unspecified: Secondary | ICD-10-CM | POA: Diagnosis not present

## 2016-11-20 DIAGNOSIS — M1990 Unspecified osteoarthritis, unspecified site: Secondary | ICD-10-CM | POA: Diagnosis not present

## 2016-11-20 DIAGNOSIS — S90512A Abrasion, left ankle, initial encounter: Secondary | ICD-10-CM | POA: Diagnosis not present

## 2016-11-20 DIAGNOSIS — I10 Essential (primary) hypertension: Secondary | ICD-10-CM | POA: Diagnosis not present

## 2016-11-20 DIAGNOSIS — L97812 Non-pressure chronic ulcer of other part of right lower leg with fat layer exposed: Secondary | ICD-10-CM | POA: Diagnosis not present

## 2016-11-20 DIAGNOSIS — Z880 Allergy status to penicillin: Secondary | ICD-10-CM | POA: Diagnosis not present

## 2016-11-21 DIAGNOSIS — M869 Osteomyelitis, unspecified: Secondary | ICD-10-CM | POA: Diagnosis not present

## 2016-11-21 DIAGNOSIS — L97909 Non-pressure chronic ulcer of unspecified part of unspecified lower leg with unspecified severity: Secondary | ICD-10-CM | POA: Diagnosis not present

## 2016-11-22 DIAGNOSIS — M869 Osteomyelitis, unspecified: Secondary | ICD-10-CM | POA: Diagnosis not present

## 2016-11-22 DIAGNOSIS — L97909 Non-pressure chronic ulcer of unspecified part of unspecified lower leg with unspecified severity: Secondary | ICD-10-CM | POA: Diagnosis not present

## 2016-11-23 DIAGNOSIS — M869 Osteomyelitis, unspecified: Secondary | ICD-10-CM | POA: Diagnosis not present

## 2016-11-23 DIAGNOSIS — L97909 Non-pressure chronic ulcer of unspecified part of unspecified lower leg with unspecified severity: Secondary | ICD-10-CM | POA: Diagnosis not present

## 2016-11-24 DIAGNOSIS — L97909 Non-pressure chronic ulcer of unspecified part of unspecified lower leg with unspecified severity: Secondary | ICD-10-CM | POA: Diagnosis not present

## 2016-11-24 DIAGNOSIS — I872 Venous insufficiency (chronic) (peripheral): Secondary | ICD-10-CM | POA: Diagnosis not present

## 2016-11-24 DIAGNOSIS — E7800 Pure hypercholesterolemia, unspecified: Secondary | ICD-10-CM | POA: Diagnosis not present

## 2016-11-24 DIAGNOSIS — I110 Hypertensive heart disease with heart failure: Secondary | ICD-10-CM | POA: Diagnosis not present

## 2016-11-24 DIAGNOSIS — L97311 Non-pressure chronic ulcer of right ankle limited to breakdown of skin: Secondary | ICD-10-CM | POA: Diagnosis not present

## 2016-11-24 DIAGNOSIS — M869 Osteomyelitis, unspecified: Secondary | ICD-10-CM | POA: Diagnosis not present

## 2016-11-24 DIAGNOSIS — L97521 Non-pressure chronic ulcer of other part of left foot limited to breakdown of skin: Secondary | ICD-10-CM | POA: Diagnosis not present

## 2016-11-24 DIAGNOSIS — I509 Heart failure, unspecified: Secondary | ICD-10-CM | POA: Diagnosis not present

## 2016-11-24 DIAGNOSIS — M2550 Pain in unspecified joint: Secondary | ICD-10-CM | POA: Diagnosis not present

## 2016-11-24 DIAGNOSIS — L97321 Non-pressure chronic ulcer of left ankle limited to breakdown of skin: Secondary | ICD-10-CM | POA: Diagnosis not present

## 2016-11-24 DIAGNOSIS — D649 Anemia, unspecified: Secondary | ICD-10-CM | POA: Diagnosis not present

## 2016-11-25 DIAGNOSIS — M869 Osteomyelitis, unspecified: Secondary | ICD-10-CM | POA: Diagnosis not present

## 2016-11-25 DIAGNOSIS — L97909 Non-pressure chronic ulcer of unspecified part of unspecified lower leg with unspecified severity: Secondary | ICD-10-CM | POA: Diagnosis not present

## 2016-11-26 DIAGNOSIS — L97909 Non-pressure chronic ulcer of unspecified part of unspecified lower leg with unspecified severity: Secondary | ICD-10-CM | POA: Diagnosis not present

## 2016-11-26 DIAGNOSIS — M869 Osteomyelitis, unspecified: Secondary | ICD-10-CM | POA: Diagnosis not present

## 2016-11-27 DIAGNOSIS — S91302A Unspecified open wound, left foot, initial encounter: Secondary | ICD-10-CM | POA: Diagnosis not present

## 2016-11-27 DIAGNOSIS — Z87891 Personal history of nicotine dependence: Secondary | ICD-10-CM | POA: Diagnosis not present

## 2016-11-27 DIAGNOSIS — L97512 Non-pressure chronic ulcer of other part of right foot with fat layer exposed: Secondary | ICD-10-CM | POA: Diagnosis not present

## 2016-11-27 DIAGNOSIS — S90812D Abrasion, left foot, subsequent encounter: Secondary | ICD-10-CM | POA: Diagnosis not present

## 2016-11-27 DIAGNOSIS — L97312 Non-pressure chronic ulcer of right ankle with fat layer exposed: Secondary | ICD-10-CM | POA: Diagnosis not present

## 2016-11-27 DIAGNOSIS — Z886 Allergy status to analgesic agent status: Secondary | ICD-10-CM | POA: Diagnosis not present

## 2016-11-27 DIAGNOSIS — I10 Essential (primary) hypertension: Secondary | ICD-10-CM | POA: Diagnosis not present

## 2016-11-27 DIAGNOSIS — Z880 Allergy status to penicillin: Secondary | ICD-10-CM | POA: Diagnosis not present

## 2016-11-27 DIAGNOSIS — L97909 Non-pressure chronic ulcer of unspecified part of unspecified lower leg with unspecified severity: Secondary | ICD-10-CM | POA: Diagnosis not present

## 2016-11-27 DIAGNOSIS — S90512D Abrasion, left ankle, subsequent encounter: Secondary | ICD-10-CM | POA: Diagnosis not present

## 2016-11-27 DIAGNOSIS — S90512A Abrasion, left ankle, initial encounter: Secondary | ICD-10-CM | POA: Diagnosis not present

## 2016-11-27 DIAGNOSIS — I872 Venous insufficiency (chronic) (peripheral): Secondary | ICD-10-CM | POA: Diagnosis not present

## 2016-11-27 DIAGNOSIS — M1990 Unspecified osteoarthritis, unspecified site: Secondary | ICD-10-CM | POA: Diagnosis not present

## 2016-11-27 DIAGNOSIS — Z79899 Other long term (current) drug therapy: Secondary | ICD-10-CM | POA: Diagnosis not present

## 2016-11-27 DIAGNOSIS — M869 Osteomyelitis, unspecified: Secondary | ICD-10-CM | POA: Diagnosis not present

## 2016-11-27 DIAGNOSIS — L97812 Non-pressure chronic ulcer of other part of right lower leg with fat layer exposed: Secondary | ICD-10-CM | POA: Diagnosis not present

## 2016-11-28 DIAGNOSIS — L97909 Non-pressure chronic ulcer of unspecified part of unspecified lower leg with unspecified severity: Secondary | ICD-10-CM | POA: Diagnosis not present

## 2016-11-28 DIAGNOSIS — M869 Osteomyelitis, unspecified: Secondary | ICD-10-CM | POA: Diagnosis not present

## 2016-11-29 DIAGNOSIS — L97909 Non-pressure chronic ulcer of unspecified part of unspecified lower leg with unspecified severity: Secondary | ICD-10-CM | POA: Diagnosis not present

## 2016-11-29 DIAGNOSIS — L97521 Non-pressure chronic ulcer of other part of left foot limited to breakdown of skin: Secondary | ICD-10-CM | POA: Diagnosis not present

## 2016-11-29 DIAGNOSIS — I872 Venous insufficiency (chronic) (peripheral): Secondary | ICD-10-CM | POA: Diagnosis not present

## 2016-11-29 DIAGNOSIS — L97321 Non-pressure chronic ulcer of left ankle limited to breakdown of skin: Secondary | ICD-10-CM | POA: Diagnosis not present

## 2016-11-29 DIAGNOSIS — L97311 Non-pressure chronic ulcer of right ankle limited to breakdown of skin: Secondary | ICD-10-CM | POA: Diagnosis not present

## 2016-11-29 DIAGNOSIS — M869 Osteomyelitis, unspecified: Secondary | ICD-10-CM | POA: Diagnosis not present

## 2016-11-29 DIAGNOSIS — I509 Heart failure, unspecified: Secondary | ICD-10-CM | POA: Diagnosis not present

## 2016-11-29 DIAGNOSIS — I110 Hypertensive heart disease with heart failure: Secondary | ICD-10-CM | POA: Diagnosis not present

## 2016-11-30 DIAGNOSIS — M869 Osteomyelitis, unspecified: Secondary | ICD-10-CM | POA: Diagnosis not present

## 2016-11-30 DIAGNOSIS — L97909 Non-pressure chronic ulcer of unspecified part of unspecified lower leg with unspecified severity: Secondary | ICD-10-CM | POA: Diagnosis not present

## 2016-11-30 DIAGNOSIS — M868X0 Other osteomyelitis, multiple sites: Secondary | ICD-10-CM | POA: Diagnosis not present

## 2016-12-01 DIAGNOSIS — I739 Peripheral vascular disease, unspecified: Secondary | ICD-10-CM | POA: Diagnosis not present

## 2016-12-01 DIAGNOSIS — D649 Anemia, unspecified: Secondary | ICD-10-CM | POA: Diagnosis not present

## 2016-12-01 DIAGNOSIS — L98499 Non-pressure chronic ulcer of skin of other sites with unspecified severity: Secondary | ICD-10-CM | POA: Diagnosis not present

## 2016-12-01 DIAGNOSIS — M2550 Pain in unspecified joint: Secondary | ICD-10-CM | POA: Diagnosis not present

## 2016-12-01 DIAGNOSIS — M869 Osteomyelitis, unspecified: Secondary | ICD-10-CM | POA: Diagnosis not present

## 2016-12-01 DIAGNOSIS — E7800 Pure hypercholesterolemia, unspecified: Secondary | ICD-10-CM | POA: Diagnosis not present

## 2016-12-01 DIAGNOSIS — L97909 Non-pressure chronic ulcer of unspecified part of unspecified lower leg with unspecified severity: Secondary | ICD-10-CM | POA: Diagnosis not present

## 2016-12-01 DIAGNOSIS — Z6832 Body mass index (BMI) 32.0-32.9, adult: Secondary | ICD-10-CM | POA: Diagnosis not present

## 2016-12-02 DIAGNOSIS — L97909 Non-pressure chronic ulcer of unspecified part of unspecified lower leg with unspecified severity: Secondary | ICD-10-CM | POA: Diagnosis not present

## 2016-12-02 DIAGNOSIS — M869 Osteomyelitis, unspecified: Secondary | ICD-10-CM | POA: Diagnosis not present

## 2016-12-03 DIAGNOSIS — L97909 Non-pressure chronic ulcer of unspecified part of unspecified lower leg with unspecified severity: Secondary | ICD-10-CM | POA: Diagnosis not present

## 2016-12-03 DIAGNOSIS — M869 Osteomyelitis, unspecified: Secondary | ICD-10-CM | POA: Diagnosis not present

## 2016-12-04 DIAGNOSIS — L97212 Non-pressure chronic ulcer of right calf with fat layer exposed: Secondary | ICD-10-CM | POA: Diagnosis not present

## 2016-12-04 DIAGNOSIS — S90812D Abrasion, left foot, subsequent encounter: Secondary | ICD-10-CM | POA: Diagnosis not present

## 2016-12-04 DIAGNOSIS — Z880 Allergy status to penicillin: Secondary | ICD-10-CM | POA: Diagnosis not present

## 2016-12-04 DIAGNOSIS — L97909 Non-pressure chronic ulcer of unspecified part of unspecified lower leg with unspecified severity: Secondary | ICD-10-CM | POA: Diagnosis not present

## 2016-12-04 DIAGNOSIS — Z79899 Other long term (current) drug therapy: Secondary | ICD-10-CM | POA: Diagnosis not present

## 2016-12-04 DIAGNOSIS — S91302A Unspecified open wound, left foot, initial encounter: Secondary | ICD-10-CM | POA: Diagnosis not present

## 2016-12-04 DIAGNOSIS — S91002A Unspecified open wound, left ankle, initial encounter: Secondary | ICD-10-CM | POA: Diagnosis not present

## 2016-12-04 DIAGNOSIS — L97812 Non-pressure chronic ulcer of other part of right lower leg with fat layer exposed: Secondary | ICD-10-CM | POA: Diagnosis not present

## 2016-12-04 DIAGNOSIS — S90512D Abrasion, left ankle, subsequent encounter: Secondary | ICD-10-CM | POA: Diagnosis not present

## 2016-12-04 DIAGNOSIS — I10 Essential (primary) hypertension: Secondary | ICD-10-CM | POA: Diagnosis not present

## 2016-12-04 DIAGNOSIS — M869 Osteomyelitis, unspecified: Secondary | ICD-10-CM | POA: Diagnosis not present

## 2016-12-04 DIAGNOSIS — I872 Venous insufficiency (chronic) (peripheral): Secondary | ICD-10-CM | POA: Diagnosis not present

## 2016-12-04 DIAGNOSIS — Z886 Allergy status to analgesic agent status: Secondary | ICD-10-CM | POA: Diagnosis not present

## 2016-12-04 DIAGNOSIS — M86171 Other acute osteomyelitis, right ankle and foot: Secondary | ICD-10-CM | POA: Diagnosis not present

## 2016-12-04 DIAGNOSIS — M1990 Unspecified osteoarthritis, unspecified site: Secondary | ICD-10-CM | POA: Diagnosis not present

## 2016-12-04 DIAGNOSIS — Z87891 Personal history of nicotine dependence: Secondary | ICD-10-CM | POA: Diagnosis not present

## 2016-12-05 DIAGNOSIS — I739 Peripheral vascular disease, unspecified: Secondary | ICD-10-CM | POA: Diagnosis not present

## 2016-12-05 DIAGNOSIS — L98499 Non-pressure chronic ulcer of skin of other sites with unspecified severity: Secondary | ICD-10-CM | POA: Diagnosis not present

## 2016-12-06 DIAGNOSIS — L97909 Non-pressure chronic ulcer of unspecified part of unspecified lower leg with unspecified severity: Secondary | ICD-10-CM | POA: Diagnosis not present

## 2016-12-06 DIAGNOSIS — M869 Osteomyelitis, unspecified: Secondary | ICD-10-CM | POA: Diagnosis not present

## 2016-12-07 DIAGNOSIS — L97909 Non-pressure chronic ulcer of unspecified part of unspecified lower leg with unspecified severity: Secondary | ICD-10-CM | POA: Diagnosis not present

## 2016-12-07 DIAGNOSIS — M869 Osteomyelitis, unspecified: Secondary | ICD-10-CM | POA: Diagnosis not present

## 2016-12-08 DIAGNOSIS — L97311 Non-pressure chronic ulcer of right ankle limited to breakdown of skin: Secondary | ICD-10-CM | POA: Diagnosis not present

## 2016-12-08 DIAGNOSIS — I110 Hypertensive heart disease with heart failure: Secondary | ICD-10-CM | POA: Diagnosis not present

## 2016-12-08 DIAGNOSIS — L97521 Non-pressure chronic ulcer of other part of left foot limited to breakdown of skin: Secondary | ICD-10-CM | POA: Diagnosis not present

## 2016-12-08 DIAGNOSIS — I872 Venous insufficiency (chronic) (peripheral): Secondary | ICD-10-CM | POA: Diagnosis not present

## 2016-12-08 DIAGNOSIS — I509 Heart failure, unspecified: Secondary | ICD-10-CM | POA: Diagnosis not present

## 2016-12-08 DIAGNOSIS — E7800 Pure hypercholesterolemia, unspecified: Secondary | ICD-10-CM | POA: Diagnosis not present

## 2016-12-08 DIAGNOSIS — M2550 Pain in unspecified joint: Secondary | ICD-10-CM | POA: Diagnosis not present

## 2016-12-08 DIAGNOSIS — M869 Osteomyelitis, unspecified: Secondary | ICD-10-CM | POA: Diagnosis not present

## 2016-12-08 DIAGNOSIS — D649 Anemia, unspecified: Secondary | ICD-10-CM | POA: Diagnosis not present

## 2016-12-08 DIAGNOSIS — L97321 Non-pressure chronic ulcer of left ankle limited to breakdown of skin: Secondary | ICD-10-CM | POA: Diagnosis not present

## 2016-12-08 DIAGNOSIS — L97909 Non-pressure chronic ulcer of unspecified part of unspecified lower leg with unspecified severity: Secondary | ICD-10-CM | POA: Diagnosis not present

## 2016-12-09 DIAGNOSIS — L97909 Non-pressure chronic ulcer of unspecified part of unspecified lower leg with unspecified severity: Secondary | ICD-10-CM | POA: Diagnosis not present

## 2016-12-09 DIAGNOSIS — M869 Osteomyelitis, unspecified: Secondary | ICD-10-CM | POA: Diagnosis not present

## 2016-12-10 DIAGNOSIS — L97909 Non-pressure chronic ulcer of unspecified part of unspecified lower leg with unspecified severity: Secondary | ICD-10-CM | POA: Diagnosis not present

## 2016-12-10 DIAGNOSIS — M869 Osteomyelitis, unspecified: Secondary | ICD-10-CM | POA: Diagnosis not present

## 2016-12-11 DIAGNOSIS — Z886 Allergy status to analgesic agent status: Secondary | ICD-10-CM | POA: Diagnosis not present

## 2016-12-11 DIAGNOSIS — I10 Essential (primary) hypertension: Secondary | ICD-10-CM | POA: Diagnosis not present

## 2016-12-11 DIAGNOSIS — M1990 Unspecified osteoarthritis, unspecified site: Secondary | ICD-10-CM | POA: Diagnosis not present

## 2016-12-11 DIAGNOSIS — M869 Osteomyelitis, unspecified: Secondary | ICD-10-CM | POA: Diagnosis not present

## 2016-12-11 DIAGNOSIS — L97909 Non-pressure chronic ulcer of unspecified part of unspecified lower leg with unspecified severity: Secondary | ICD-10-CM | POA: Diagnosis not present

## 2016-12-11 DIAGNOSIS — S90812A Abrasion, left foot, initial encounter: Secondary | ICD-10-CM | POA: Diagnosis not present

## 2016-12-11 DIAGNOSIS — I872 Venous insufficiency (chronic) (peripheral): Secondary | ICD-10-CM | POA: Diagnosis not present

## 2016-12-11 DIAGNOSIS — Z87891 Personal history of nicotine dependence: Secondary | ICD-10-CM | POA: Diagnosis not present

## 2016-12-11 DIAGNOSIS — L97819 Non-pressure chronic ulcer of other part of right lower leg with unspecified severity: Secondary | ICD-10-CM | POA: Diagnosis not present

## 2016-12-11 DIAGNOSIS — S90812D Abrasion, left foot, subsequent encounter: Secondary | ICD-10-CM | POA: Diagnosis not present

## 2016-12-11 DIAGNOSIS — Z880 Allergy status to penicillin: Secondary | ICD-10-CM | POA: Diagnosis not present

## 2016-12-11 DIAGNOSIS — L97812 Non-pressure chronic ulcer of other part of right lower leg with fat layer exposed: Secondary | ICD-10-CM | POA: Diagnosis not present

## 2016-12-11 DIAGNOSIS — L97312 Non-pressure chronic ulcer of right ankle with fat layer exposed: Secondary | ICD-10-CM | POA: Diagnosis not present

## 2016-12-11 DIAGNOSIS — M86671 Other chronic osteomyelitis, right ankle and foot: Secondary | ICD-10-CM | POA: Diagnosis not present

## 2016-12-11 DIAGNOSIS — S90512A Abrasion, left ankle, initial encounter: Secondary | ICD-10-CM | POA: Diagnosis not present

## 2016-12-11 DIAGNOSIS — L97212 Non-pressure chronic ulcer of right calf with fat layer exposed: Secondary | ICD-10-CM | POA: Diagnosis not present

## 2016-12-11 DIAGNOSIS — S90512D Abrasion, left ankle, subsequent encounter: Secondary | ICD-10-CM | POA: Diagnosis not present

## 2016-12-12 DIAGNOSIS — M869 Osteomyelitis, unspecified: Secondary | ICD-10-CM | POA: Diagnosis not present

## 2016-12-12 DIAGNOSIS — L97909 Non-pressure chronic ulcer of unspecified part of unspecified lower leg with unspecified severity: Secondary | ICD-10-CM | POA: Diagnosis not present

## 2016-12-13 DIAGNOSIS — I110 Hypertensive heart disease with heart failure: Secondary | ICD-10-CM | POA: Diagnosis not present

## 2016-12-13 DIAGNOSIS — L97321 Non-pressure chronic ulcer of left ankle limited to breakdown of skin: Secondary | ICD-10-CM | POA: Diagnosis not present

## 2016-12-13 DIAGNOSIS — I872 Venous insufficiency (chronic) (peripheral): Secondary | ICD-10-CM | POA: Diagnosis not present

## 2016-12-13 DIAGNOSIS — M2550 Pain in unspecified joint: Secondary | ICD-10-CM | POA: Diagnosis not present

## 2016-12-13 DIAGNOSIS — L97311 Non-pressure chronic ulcer of right ankle limited to breakdown of skin: Secondary | ICD-10-CM | POA: Diagnosis not present

## 2016-12-13 DIAGNOSIS — E7800 Pure hypercholesterolemia, unspecified: Secondary | ICD-10-CM | POA: Diagnosis not present

## 2016-12-13 DIAGNOSIS — I509 Heart failure, unspecified: Secondary | ICD-10-CM | POA: Diagnosis not present

## 2016-12-13 DIAGNOSIS — L97909 Non-pressure chronic ulcer of unspecified part of unspecified lower leg with unspecified severity: Secondary | ICD-10-CM | POA: Diagnosis not present

## 2016-12-13 DIAGNOSIS — M869 Osteomyelitis, unspecified: Secondary | ICD-10-CM | POA: Diagnosis not present

## 2016-12-13 DIAGNOSIS — D649 Anemia, unspecified: Secondary | ICD-10-CM | POA: Diagnosis not present

## 2016-12-13 DIAGNOSIS — L97521 Non-pressure chronic ulcer of other part of left foot limited to breakdown of skin: Secondary | ICD-10-CM | POA: Diagnosis not present

## 2016-12-14 DIAGNOSIS — L97909 Non-pressure chronic ulcer of unspecified part of unspecified lower leg with unspecified severity: Secondary | ICD-10-CM | POA: Diagnosis not present

## 2016-12-14 DIAGNOSIS — M869 Osteomyelitis, unspecified: Secondary | ICD-10-CM | POA: Diagnosis not present

## 2016-12-15 DIAGNOSIS — M869 Osteomyelitis, unspecified: Secondary | ICD-10-CM | POA: Diagnosis not present

## 2016-12-15 DIAGNOSIS — I110 Hypertensive heart disease with heart failure: Secondary | ICD-10-CM | POA: Diagnosis not present

## 2016-12-15 DIAGNOSIS — L97521 Non-pressure chronic ulcer of other part of left foot limited to breakdown of skin: Secondary | ICD-10-CM | POA: Diagnosis not present

## 2016-12-15 DIAGNOSIS — I509 Heart failure, unspecified: Secondary | ICD-10-CM | POA: Diagnosis not present

## 2016-12-15 DIAGNOSIS — L97311 Non-pressure chronic ulcer of right ankle limited to breakdown of skin: Secondary | ICD-10-CM | POA: Diagnosis not present

## 2016-12-15 DIAGNOSIS — I872 Venous insufficiency (chronic) (peripheral): Secondary | ICD-10-CM | POA: Diagnosis not present

## 2016-12-15 DIAGNOSIS — L97321 Non-pressure chronic ulcer of left ankle limited to breakdown of skin: Secondary | ICD-10-CM | POA: Diagnosis not present

## 2016-12-15 DIAGNOSIS — L97909 Non-pressure chronic ulcer of unspecified part of unspecified lower leg with unspecified severity: Secondary | ICD-10-CM | POA: Diagnosis not present

## 2016-12-16 DIAGNOSIS — L97909 Non-pressure chronic ulcer of unspecified part of unspecified lower leg with unspecified severity: Secondary | ICD-10-CM | POA: Diagnosis not present

## 2016-12-16 DIAGNOSIS — M869 Osteomyelitis, unspecified: Secondary | ICD-10-CM | POA: Diagnosis not present

## 2016-12-17 DIAGNOSIS — M869 Osteomyelitis, unspecified: Secondary | ICD-10-CM | POA: Diagnosis not present

## 2016-12-17 DIAGNOSIS — L97909 Non-pressure chronic ulcer of unspecified part of unspecified lower leg with unspecified severity: Secondary | ICD-10-CM | POA: Diagnosis not present

## 2016-12-18 DIAGNOSIS — S90512D Abrasion, left ankle, subsequent encounter: Secondary | ICD-10-CM | POA: Diagnosis not present

## 2016-12-18 DIAGNOSIS — L97812 Non-pressure chronic ulcer of other part of right lower leg with fat layer exposed: Secondary | ICD-10-CM | POA: Diagnosis not present

## 2016-12-18 DIAGNOSIS — L97909 Non-pressure chronic ulcer of unspecified part of unspecified lower leg with unspecified severity: Secondary | ICD-10-CM | POA: Diagnosis not present

## 2016-12-18 DIAGNOSIS — Z87891 Personal history of nicotine dependence: Secondary | ICD-10-CM | POA: Diagnosis not present

## 2016-12-18 DIAGNOSIS — S90512A Abrasion, left ankle, initial encounter: Secondary | ICD-10-CM | POA: Diagnosis not present

## 2016-12-18 DIAGNOSIS — M869 Osteomyelitis, unspecified: Secondary | ICD-10-CM | POA: Diagnosis not present

## 2016-12-18 DIAGNOSIS — S90812D Abrasion, left foot, subsequent encounter: Secondary | ICD-10-CM | POA: Diagnosis not present

## 2016-12-18 DIAGNOSIS — M86671 Other chronic osteomyelitis, right ankle and foot: Secondary | ICD-10-CM | POA: Diagnosis not present

## 2016-12-18 DIAGNOSIS — I10 Essential (primary) hypertension: Secondary | ICD-10-CM | POA: Diagnosis not present

## 2016-12-18 DIAGNOSIS — M1990 Unspecified osteoarthritis, unspecified site: Secondary | ICD-10-CM | POA: Diagnosis not present

## 2016-12-18 DIAGNOSIS — M86661 Other chronic osteomyelitis, right tibia and fibula: Secondary | ICD-10-CM | POA: Diagnosis not present

## 2016-12-18 DIAGNOSIS — I872 Venous insufficiency (chronic) (peripheral): Secondary | ICD-10-CM | POA: Diagnosis not present

## 2016-12-19 DIAGNOSIS — L97813 Non-pressure chronic ulcer of other part of right lower leg with necrosis of muscle: Secondary | ICD-10-CM | POA: Diagnosis not present

## 2016-12-19 DIAGNOSIS — L97829 Non-pressure chronic ulcer of other part of left lower leg with unspecified severity: Secondary | ICD-10-CM | POA: Diagnosis not present

## 2016-12-19 DIAGNOSIS — M2550 Pain in unspecified joint: Secondary | ICD-10-CM | POA: Diagnosis not present

## 2016-12-19 DIAGNOSIS — Z79899 Other long term (current) drug therapy: Secondary | ICD-10-CM | POA: Diagnosis not present

## 2016-12-19 DIAGNOSIS — M869 Osteomyelitis, unspecified: Secondary | ICD-10-CM | POA: Diagnosis not present

## 2016-12-19 DIAGNOSIS — L97909 Non-pressure chronic ulcer of unspecified part of unspecified lower leg with unspecified severity: Secondary | ICD-10-CM | POA: Diagnosis not present

## 2016-12-19 DIAGNOSIS — I872 Venous insufficiency (chronic) (peripheral): Secondary | ICD-10-CM | POA: Diagnosis not present

## 2016-12-20 DIAGNOSIS — M869 Osteomyelitis, unspecified: Secondary | ICD-10-CM | POA: Diagnosis not present

## 2016-12-20 DIAGNOSIS — I872 Venous insufficiency (chronic) (peripheral): Secondary | ICD-10-CM | POA: Diagnosis not present

## 2016-12-20 DIAGNOSIS — L97321 Non-pressure chronic ulcer of left ankle limited to breakdown of skin: Secondary | ICD-10-CM | POA: Diagnosis not present

## 2016-12-20 DIAGNOSIS — L97311 Non-pressure chronic ulcer of right ankle limited to breakdown of skin: Secondary | ICD-10-CM | POA: Diagnosis not present

## 2016-12-20 DIAGNOSIS — L97909 Non-pressure chronic ulcer of unspecified part of unspecified lower leg with unspecified severity: Secondary | ICD-10-CM | POA: Diagnosis not present

## 2016-12-20 DIAGNOSIS — I509 Heart failure, unspecified: Secondary | ICD-10-CM | POA: Diagnosis not present

## 2016-12-20 DIAGNOSIS — I110 Hypertensive heart disease with heart failure: Secondary | ICD-10-CM | POA: Diagnosis not present

## 2016-12-20 DIAGNOSIS — L97521 Non-pressure chronic ulcer of other part of left foot limited to breakdown of skin: Secondary | ICD-10-CM | POA: Diagnosis not present

## 2016-12-21 DIAGNOSIS — L97909 Non-pressure chronic ulcer of unspecified part of unspecified lower leg with unspecified severity: Secondary | ICD-10-CM | POA: Diagnosis not present

## 2016-12-21 DIAGNOSIS — M869 Osteomyelitis, unspecified: Secondary | ICD-10-CM | POA: Diagnosis not present

## 2016-12-22 DIAGNOSIS — D649 Anemia, unspecified: Secondary | ICD-10-CM | POA: Diagnosis not present

## 2016-12-22 DIAGNOSIS — M2550 Pain in unspecified joint: Secondary | ICD-10-CM | POA: Diagnosis not present

## 2016-12-22 DIAGNOSIS — E7800 Pure hypercholesterolemia, unspecified: Secondary | ICD-10-CM | POA: Diagnosis not present

## 2016-12-22 DIAGNOSIS — M869 Osteomyelitis, unspecified: Secondary | ICD-10-CM | POA: Diagnosis not present

## 2016-12-22 DIAGNOSIS — L97909 Non-pressure chronic ulcer of unspecified part of unspecified lower leg with unspecified severity: Secondary | ICD-10-CM | POA: Diagnosis not present

## 2016-12-23 DIAGNOSIS — M869 Osteomyelitis, unspecified: Secondary | ICD-10-CM | POA: Diagnosis not present

## 2016-12-23 DIAGNOSIS — L97909 Non-pressure chronic ulcer of unspecified part of unspecified lower leg with unspecified severity: Secondary | ICD-10-CM | POA: Diagnosis not present

## 2016-12-24 DIAGNOSIS — L97909 Non-pressure chronic ulcer of unspecified part of unspecified lower leg with unspecified severity: Secondary | ICD-10-CM | POA: Diagnosis not present

## 2016-12-24 DIAGNOSIS — M869 Osteomyelitis, unspecified: Secondary | ICD-10-CM | POA: Diagnosis not present

## 2016-12-25 DIAGNOSIS — Z880 Allergy status to penicillin: Secondary | ICD-10-CM | POA: Diagnosis not present

## 2016-12-25 DIAGNOSIS — S90512A Abrasion, left ankle, initial encounter: Secondary | ICD-10-CM | POA: Diagnosis not present

## 2016-12-25 DIAGNOSIS — Z79899 Other long term (current) drug therapy: Secondary | ICD-10-CM | POA: Diagnosis not present

## 2016-12-25 DIAGNOSIS — M869 Osteomyelitis, unspecified: Secondary | ICD-10-CM | POA: Diagnosis not present

## 2016-12-25 DIAGNOSIS — S90812D Abrasion, left foot, subsequent encounter: Secondary | ICD-10-CM | POA: Diagnosis not present

## 2016-12-25 DIAGNOSIS — M1990 Unspecified osteoarthritis, unspecified site: Secondary | ICD-10-CM | POA: Diagnosis not present

## 2016-12-25 DIAGNOSIS — Z886 Allergy status to analgesic agent status: Secondary | ICD-10-CM | POA: Diagnosis not present

## 2016-12-25 DIAGNOSIS — L97219 Non-pressure chronic ulcer of right calf with unspecified severity: Secondary | ICD-10-CM | POA: Diagnosis not present

## 2016-12-25 DIAGNOSIS — L97909 Non-pressure chronic ulcer of unspecified part of unspecified lower leg with unspecified severity: Secondary | ICD-10-CM | POA: Diagnosis not present

## 2016-12-25 DIAGNOSIS — L97819 Non-pressure chronic ulcer of other part of right lower leg with unspecified severity: Secondary | ICD-10-CM | POA: Diagnosis not present

## 2016-12-25 DIAGNOSIS — I10 Essential (primary) hypertension: Secondary | ICD-10-CM | POA: Diagnosis not present

## 2016-12-25 DIAGNOSIS — L97812 Non-pressure chronic ulcer of other part of right lower leg with fat layer exposed: Secondary | ICD-10-CM | POA: Diagnosis not present

## 2016-12-25 DIAGNOSIS — M86661 Other chronic osteomyelitis, right tibia and fibula: Secondary | ICD-10-CM | POA: Diagnosis not present

## 2016-12-25 DIAGNOSIS — I872 Venous insufficiency (chronic) (peripheral): Secondary | ICD-10-CM | POA: Diagnosis not present

## 2016-12-25 DIAGNOSIS — Z87891 Personal history of nicotine dependence: Secondary | ICD-10-CM | POA: Diagnosis not present

## 2016-12-25 DIAGNOSIS — S90812A Abrasion, left foot, initial encounter: Secondary | ICD-10-CM | POA: Diagnosis not present

## 2016-12-25 DIAGNOSIS — S90512D Abrasion, left ankle, subsequent encounter: Secondary | ICD-10-CM | POA: Diagnosis not present

## 2016-12-25 DIAGNOSIS — M86361 Chronic multifocal osteomyelitis, right tibia and fibula: Secondary | ICD-10-CM | POA: Diagnosis not present

## 2016-12-26 DIAGNOSIS — L97819 Non-pressure chronic ulcer of other part of right lower leg with unspecified severity: Secondary | ICD-10-CM | POA: Diagnosis not present

## 2016-12-26 DIAGNOSIS — L97909 Non-pressure chronic ulcer of unspecified part of unspecified lower leg with unspecified severity: Secondary | ICD-10-CM | POA: Diagnosis not present

## 2016-12-26 DIAGNOSIS — I10 Essential (primary) hypertension: Secondary | ICD-10-CM | POA: Diagnosis not present

## 2016-12-26 DIAGNOSIS — S90512D Abrasion, left ankle, subsequent encounter: Secondary | ICD-10-CM | POA: Diagnosis not present

## 2016-12-26 DIAGNOSIS — M86661 Other chronic osteomyelitis, right tibia and fibula: Secondary | ICD-10-CM | POA: Diagnosis not present

## 2016-12-26 DIAGNOSIS — M869 Osteomyelitis, unspecified: Secondary | ICD-10-CM | POA: Diagnosis not present

## 2016-12-26 DIAGNOSIS — L97812 Non-pressure chronic ulcer of other part of right lower leg with fat layer exposed: Secondary | ICD-10-CM | POA: Diagnosis not present

## 2016-12-26 DIAGNOSIS — S90812D Abrasion, left foot, subsequent encounter: Secondary | ICD-10-CM | POA: Diagnosis not present

## 2016-12-26 DIAGNOSIS — M8668 Other chronic osteomyelitis, other site: Secondary | ICD-10-CM | POA: Diagnosis not present

## 2016-12-27 DIAGNOSIS — L97909 Non-pressure chronic ulcer of unspecified part of unspecified lower leg with unspecified severity: Secondary | ICD-10-CM | POA: Diagnosis not present

## 2016-12-27 DIAGNOSIS — I10 Essential (primary) hypertension: Secondary | ICD-10-CM | POA: Diagnosis not present

## 2016-12-27 DIAGNOSIS — M869 Osteomyelitis, unspecified: Secondary | ICD-10-CM | POA: Diagnosis not present

## 2016-12-27 DIAGNOSIS — S90512D Abrasion, left ankle, subsequent encounter: Secondary | ICD-10-CM | POA: Diagnosis not present

## 2016-12-27 DIAGNOSIS — I872 Venous insufficiency (chronic) (peripheral): Secondary | ICD-10-CM | POA: Diagnosis not present

## 2016-12-27 DIAGNOSIS — M86661 Other chronic osteomyelitis, right tibia and fibula: Secondary | ICD-10-CM | POA: Diagnosis not present

## 2016-12-27 DIAGNOSIS — L97812 Non-pressure chronic ulcer of other part of right lower leg with fat layer exposed: Secondary | ICD-10-CM | POA: Diagnosis not present

## 2016-12-27 DIAGNOSIS — L97819 Non-pressure chronic ulcer of other part of right lower leg with unspecified severity: Secondary | ICD-10-CM | POA: Diagnosis not present

## 2016-12-27 DIAGNOSIS — S90812D Abrasion, left foot, subsequent encounter: Secondary | ICD-10-CM | POA: Diagnosis not present

## 2016-12-28 DIAGNOSIS — M86661 Other chronic osteomyelitis, right tibia and fibula: Secondary | ICD-10-CM | POA: Diagnosis not present

## 2016-12-28 DIAGNOSIS — L97909 Non-pressure chronic ulcer of unspecified part of unspecified lower leg with unspecified severity: Secondary | ICD-10-CM | POA: Diagnosis not present

## 2016-12-28 DIAGNOSIS — S90512D Abrasion, left ankle, subsequent encounter: Secondary | ICD-10-CM | POA: Diagnosis not present

## 2016-12-28 DIAGNOSIS — L97812 Non-pressure chronic ulcer of other part of right lower leg with fat layer exposed: Secondary | ICD-10-CM | POA: Diagnosis not present

## 2016-12-28 DIAGNOSIS — M869 Osteomyelitis, unspecified: Secondary | ICD-10-CM | POA: Diagnosis not present

## 2016-12-28 DIAGNOSIS — L97819 Non-pressure chronic ulcer of other part of right lower leg with unspecified severity: Secondary | ICD-10-CM | POA: Diagnosis not present

## 2016-12-28 DIAGNOSIS — I10 Essential (primary) hypertension: Secondary | ICD-10-CM | POA: Diagnosis not present

## 2016-12-28 DIAGNOSIS — S90812D Abrasion, left foot, subsequent encounter: Secondary | ICD-10-CM | POA: Diagnosis not present

## 2016-12-28 DIAGNOSIS — M8668 Other chronic osteomyelitis, other site: Secondary | ICD-10-CM | POA: Diagnosis not present

## 2016-12-29 DIAGNOSIS — D649 Anemia, unspecified: Secondary | ICD-10-CM | POA: Diagnosis not present

## 2016-12-29 DIAGNOSIS — M86661 Other chronic osteomyelitis, right tibia and fibula: Secondary | ICD-10-CM | POA: Diagnosis not present

## 2016-12-29 DIAGNOSIS — M2550 Pain in unspecified joint: Secondary | ICD-10-CM | POA: Diagnosis not present

## 2016-12-29 DIAGNOSIS — S90812D Abrasion, left foot, subsequent encounter: Secondary | ICD-10-CM | POA: Diagnosis not present

## 2016-12-29 DIAGNOSIS — I10 Essential (primary) hypertension: Secondary | ICD-10-CM | POA: Diagnosis not present

## 2016-12-29 DIAGNOSIS — M869 Osteomyelitis, unspecified: Secondary | ICD-10-CM | POA: Diagnosis not present

## 2016-12-29 DIAGNOSIS — L97819 Non-pressure chronic ulcer of other part of right lower leg with unspecified severity: Secondary | ICD-10-CM | POA: Diagnosis not present

## 2016-12-29 DIAGNOSIS — S90512D Abrasion, left ankle, subsequent encounter: Secondary | ICD-10-CM | POA: Diagnosis not present

## 2016-12-29 DIAGNOSIS — L97909 Non-pressure chronic ulcer of unspecified part of unspecified lower leg with unspecified severity: Secondary | ICD-10-CM | POA: Diagnosis not present

## 2016-12-29 DIAGNOSIS — L97812 Non-pressure chronic ulcer of other part of right lower leg with fat layer exposed: Secondary | ICD-10-CM | POA: Diagnosis not present

## 2016-12-29 DIAGNOSIS — E7800 Pure hypercholesterolemia, unspecified: Secondary | ICD-10-CM | POA: Diagnosis not present

## 2016-12-30 DIAGNOSIS — M869 Osteomyelitis, unspecified: Secondary | ICD-10-CM | POA: Diagnosis not present

## 2016-12-30 DIAGNOSIS — L97909 Non-pressure chronic ulcer of unspecified part of unspecified lower leg with unspecified severity: Secondary | ICD-10-CM | POA: Diagnosis not present

## 2016-12-31 DIAGNOSIS — L97909 Non-pressure chronic ulcer of unspecified part of unspecified lower leg with unspecified severity: Secondary | ICD-10-CM | POA: Diagnosis not present

## 2016-12-31 DIAGNOSIS — M869 Osteomyelitis, unspecified: Secondary | ICD-10-CM | POA: Diagnosis not present

## 2017-01-01 DIAGNOSIS — L97909 Non-pressure chronic ulcer of unspecified part of unspecified lower leg with unspecified severity: Secondary | ICD-10-CM | POA: Diagnosis not present

## 2017-01-01 DIAGNOSIS — M869 Osteomyelitis, unspecified: Secondary | ICD-10-CM | POA: Diagnosis not present

## 2017-01-02 DIAGNOSIS — L97909 Non-pressure chronic ulcer of unspecified part of unspecified lower leg with unspecified severity: Secondary | ICD-10-CM | POA: Diagnosis not present

## 2017-01-02 DIAGNOSIS — M869 Osteomyelitis, unspecified: Secondary | ICD-10-CM | POA: Diagnosis not present

## 2017-01-03 DIAGNOSIS — M869 Osteomyelitis, unspecified: Secondary | ICD-10-CM | POA: Diagnosis not present

## 2017-01-03 DIAGNOSIS — L97909 Non-pressure chronic ulcer of unspecified part of unspecified lower leg with unspecified severity: Secondary | ICD-10-CM | POA: Diagnosis not present

## 2017-01-04 DIAGNOSIS — I10 Essential (primary) hypertension: Secondary | ICD-10-CM | POA: Diagnosis not present

## 2017-01-04 DIAGNOSIS — Z886 Allergy status to analgesic agent status: Secondary | ICD-10-CM | POA: Diagnosis not present

## 2017-01-04 DIAGNOSIS — M1990 Unspecified osteoarthritis, unspecified site: Secondary | ICD-10-CM | POA: Diagnosis not present

## 2017-01-04 DIAGNOSIS — S8012XA Contusion of left lower leg, initial encounter: Secondary | ICD-10-CM | POA: Diagnosis not present

## 2017-01-04 DIAGNOSIS — S90512A Abrasion, left ankle, initial encounter: Secondary | ICD-10-CM | POA: Diagnosis not present

## 2017-01-04 DIAGNOSIS — L97909 Non-pressure chronic ulcer of unspecified part of unspecified lower leg with unspecified severity: Secondary | ICD-10-CM | POA: Diagnosis not present

## 2017-01-04 DIAGNOSIS — L97812 Non-pressure chronic ulcer of other part of right lower leg with fat layer exposed: Secondary | ICD-10-CM | POA: Diagnosis not present

## 2017-01-04 DIAGNOSIS — M86671 Other chronic osteomyelitis, right ankle and foot: Secondary | ICD-10-CM | POA: Diagnosis not present

## 2017-01-04 DIAGNOSIS — M869 Osteomyelitis, unspecified: Secondary | ICD-10-CM | POA: Diagnosis not present

## 2017-01-04 DIAGNOSIS — L97212 Non-pressure chronic ulcer of right calf with fat layer exposed: Secondary | ICD-10-CM | POA: Diagnosis not present

## 2017-01-04 DIAGNOSIS — Z880 Allergy status to penicillin: Secondary | ICD-10-CM | POA: Diagnosis not present

## 2017-01-04 DIAGNOSIS — S90512D Abrasion, left ankle, subsequent encounter: Secondary | ICD-10-CM | POA: Diagnosis not present

## 2017-01-04 DIAGNOSIS — Z87891 Personal history of nicotine dependence: Secondary | ICD-10-CM | POA: Diagnosis not present

## 2017-01-04 DIAGNOSIS — I872 Venous insufficiency (chronic) (peripheral): Secondary | ICD-10-CM | POA: Diagnosis not present

## 2017-01-04 DIAGNOSIS — M8668 Other chronic osteomyelitis, other site: Secondary | ICD-10-CM | POA: Diagnosis not present

## 2017-01-04 DIAGNOSIS — M86661 Other chronic osteomyelitis, right tibia and fibula: Secondary | ICD-10-CM | POA: Diagnosis not present

## 2017-01-04 DIAGNOSIS — S90812D Abrasion, left foot, subsequent encounter: Secondary | ICD-10-CM | POA: Diagnosis not present

## 2017-01-04 DIAGNOSIS — S90812A Abrasion, left foot, initial encounter: Secondary | ICD-10-CM | POA: Diagnosis not present

## 2017-01-05 DIAGNOSIS — M170 Bilateral primary osteoarthritis of knee: Secondary | ICD-10-CM | POA: Diagnosis not present

## 2017-01-05 DIAGNOSIS — M150 Primary generalized (osteo)arthritis: Secondary | ICD-10-CM | POA: Diagnosis not present

## 2017-01-05 DIAGNOSIS — L97521 Non-pressure chronic ulcer of other part of left foot limited to breakdown of skin: Secondary | ICD-10-CM | POA: Diagnosis not present

## 2017-01-05 DIAGNOSIS — L97813 Non-pressure chronic ulcer of other part of right lower leg with necrosis of muscle: Secondary | ICD-10-CM | POA: Diagnosis not present

## 2017-01-05 DIAGNOSIS — L97322 Non-pressure chronic ulcer of left ankle with fat layer exposed: Secondary | ICD-10-CM | POA: Diagnosis not present

## 2017-01-05 DIAGNOSIS — I10 Essential (primary) hypertension: Secondary | ICD-10-CM | POA: Diagnosis not present

## 2017-01-05 DIAGNOSIS — M86271 Subacute osteomyelitis, right ankle and foot: Secondary | ICD-10-CM | POA: Diagnosis not present

## 2017-01-05 DIAGNOSIS — L97829 Non-pressure chronic ulcer of other part of left lower leg with unspecified severity: Secondary | ICD-10-CM | POA: Diagnosis not present

## 2017-01-08 DIAGNOSIS — S90512D Abrasion, left ankle, subsequent encounter: Secondary | ICD-10-CM | POA: Diagnosis not present

## 2017-01-08 DIAGNOSIS — Z880 Allergy status to penicillin: Secondary | ICD-10-CM | POA: Diagnosis not present

## 2017-01-08 DIAGNOSIS — M1990 Unspecified osteoarthritis, unspecified site: Secondary | ICD-10-CM | POA: Diagnosis not present

## 2017-01-08 DIAGNOSIS — S90812D Abrasion, left foot, subsequent encounter: Secondary | ICD-10-CM | POA: Diagnosis not present

## 2017-01-08 DIAGNOSIS — Z886 Allergy status to analgesic agent status: Secondary | ICD-10-CM | POA: Diagnosis not present

## 2017-01-08 DIAGNOSIS — Z87891 Personal history of nicotine dependence: Secondary | ICD-10-CM | POA: Diagnosis not present

## 2017-01-08 DIAGNOSIS — I10 Essential (primary) hypertension: Secondary | ICD-10-CM | POA: Diagnosis not present

## 2017-01-08 DIAGNOSIS — L97812 Non-pressure chronic ulcer of other part of right lower leg with fat layer exposed: Secondary | ICD-10-CM | POA: Diagnosis not present

## 2017-01-08 DIAGNOSIS — M8668 Other chronic osteomyelitis, other site: Secondary | ICD-10-CM | POA: Diagnosis not present

## 2017-01-09 DIAGNOSIS — L97322 Non-pressure chronic ulcer of left ankle with fat layer exposed: Secondary | ICD-10-CM | POA: Diagnosis not present

## 2017-01-09 DIAGNOSIS — L97829 Non-pressure chronic ulcer of other part of left lower leg with unspecified severity: Secondary | ICD-10-CM | POA: Diagnosis not present

## 2017-01-09 DIAGNOSIS — M1990 Unspecified osteoarthritis, unspecified site: Secondary | ICD-10-CM | POA: Diagnosis not present

## 2017-01-09 DIAGNOSIS — M8668 Other chronic osteomyelitis, other site: Secondary | ICD-10-CM | POA: Diagnosis not present

## 2017-01-09 DIAGNOSIS — L97813 Non-pressure chronic ulcer of other part of right lower leg with necrosis of muscle: Secondary | ICD-10-CM | POA: Diagnosis not present

## 2017-01-09 DIAGNOSIS — M150 Primary generalized (osteo)arthritis: Secondary | ICD-10-CM | POA: Diagnosis not present

## 2017-01-09 DIAGNOSIS — I10 Essential (primary) hypertension: Secondary | ICD-10-CM | POA: Diagnosis not present

## 2017-01-09 DIAGNOSIS — S90812D Abrasion, left foot, subsequent encounter: Secondary | ICD-10-CM | POA: Diagnosis not present

## 2017-01-09 DIAGNOSIS — L97521 Non-pressure chronic ulcer of other part of left foot limited to breakdown of skin: Secondary | ICD-10-CM | POA: Diagnosis not present

## 2017-01-09 DIAGNOSIS — M86271 Subacute osteomyelitis, right ankle and foot: Secondary | ICD-10-CM | POA: Diagnosis not present

## 2017-01-09 DIAGNOSIS — Z87891 Personal history of nicotine dependence: Secondary | ICD-10-CM | POA: Diagnosis not present

## 2017-01-09 DIAGNOSIS — S90512D Abrasion, left ankle, subsequent encounter: Secondary | ICD-10-CM | POA: Diagnosis not present

## 2017-01-09 DIAGNOSIS — L97812 Non-pressure chronic ulcer of other part of right lower leg with fat layer exposed: Secondary | ICD-10-CM | POA: Diagnosis not present

## 2017-01-10 DIAGNOSIS — L97812 Non-pressure chronic ulcer of other part of right lower leg with fat layer exposed: Secondary | ICD-10-CM | POA: Diagnosis not present

## 2017-01-10 DIAGNOSIS — S90512D Abrasion, left ankle, subsequent encounter: Secondary | ICD-10-CM | POA: Diagnosis not present

## 2017-01-10 DIAGNOSIS — M1990 Unspecified osteoarthritis, unspecified site: Secondary | ICD-10-CM | POA: Diagnosis not present

## 2017-01-10 DIAGNOSIS — M869 Osteomyelitis, unspecified: Secondary | ICD-10-CM | POA: Diagnosis not present

## 2017-01-10 DIAGNOSIS — M8668 Other chronic osteomyelitis, other site: Secondary | ICD-10-CM | POA: Diagnosis not present

## 2017-01-10 DIAGNOSIS — S90812D Abrasion, left foot, subsequent encounter: Secondary | ICD-10-CM | POA: Diagnosis not present

## 2017-01-10 DIAGNOSIS — L97909 Non-pressure chronic ulcer of unspecified part of unspecified lower leg with unspecified severity: Secondary | ICD-10-CM | POA: Diagnosis not present

## 2017-01-10 DIAGNOSIS — Z87891 Personal history of nicotine dependence: Secondary | ICD-10-CM | POA: Diagnosis not present

## 2017-01-10 DIAGNOSIS — I10 Essential (primary) hypertension: Secondary | ICD-10-CM | POA: Diagnosis not present

## 2017-01-11 DIAGNOSIS — I10 Essential (primary) hypertension: Secondary | ICD-10-CM | POA: Diagnosis not present

## 2017-01-11 DIAGNOSIS — M1990 Unspecified osteoarthritis, unspecified site: Secondary | ICD-10-CM | POA: Diagnosis not present

## 2017-01-11 DIAGNOSIS — L97812 Non-pressure chronic ulcer of other part of right lower leg with fat layer exposed: Secondary | ICD-10-CM | POA: Diagnosis not present

## 2017-01-11 DIAGNOSIS — M86661 Other chronic osteomyelitis, right tibia and fibula: Secondary | ICD-10-CM | POA: Diagnosis not present

## 2017-01-11 DIAGNOSIS — S90512D Abrasion, left ankle, subsequent encounter: Secondary | ICD-10-CM | POA: Diagnosis not present

## 2017-01-11 DIAGNOSIS — Z87891 Personal history of nicotine dependence: Secondary | ICD-10-CM | POA: Diagnosis not present

## 2017-01-11 DIAGNOSIS — S90812D Abrasion, left foot, subsequent encounter: Secondary | ICD-10-CM | POA: Diagnosis not present

## 2017-01-12 DIAGNOSIS — M150 Primary generalized (osteo)arthritis: Secondary | ICD-10-CM | POA: Diagnosis not present

## 2017-01-12 DIAGNOSIS — S90512D Abrasion, left ankle, subsequent encounter: Secondary | ICD-10-CM | POA: Diagnosis not present

## 2017-01-12 DIAGNOSIS — L97813 Non-pressure chronic ulcer of other part of right lower leg with necrosis of muscle: Secondary | ICD-10-CM | POA: Diagnosis not present

## 2017-01-12 DIAGNOSIS — Z87891 Personal history of nicotine dependence: Secondary | ICD-10-CM | POA: Diagnosis not present

## 2017-01-12 DIAGNOSIS — L97521 Non-pressure chronic ulcer of other part of left foot limited to breakdown of skin: Secondary | ICD-10-CM | POA: Diagnosis not present

## 2017-01-12 DIAGNOSIS — S90812D Abrasion, left foot, subsequent encounter: Secondary | ICD-10-CM | POA: Diagnosis not present

## 2017-01-12 DIAGNOSIS — M86271 Subacute osteomyelitis, right ankle and foot: Secondary | ICD-10-CM | POA: Diagnosis not present

## 2017-01-12 DIAGNOSIS — M1990 Unspecified osteoarthritis, unspecified site: Secondary | ICD-10-CM | POA: Diagnosis not present

## 2017-01-12 DIAGNOSIS — L97812 Non-pressure chronic ulcer of other part of right lower leg with fat layer exposed: Secondary | ICD-10-CM | POA: Diagnosis not present

## 2017-01-12 DIAGNOSIS — M86661 Other chronic osteomyelitis, right tibia and fibula: Secondary | ICD-10-CM | POA: Diagnosis not present

## 2017-01-12 DIAGNOSIS — L97829 Non-pressure chronic ulcer of other part of left lower leg with unspecified severity: Secondary | ICD-10-CM | POA: Diagnosis not present

## 2017-01-12 DIAGNOSIS — L97322 Non-pressure chronic ulcer of left ankle with fat layer exposed: Secondary | ICD-10-CM | POA: Diagnosis not present

## 2017-01-12 DIAGNOSIS — I10 Essential (primary) hypertension: Secondary | ICD-10-CM | POA: Diagnosis not present

## 2017-01-17 DIAGNOSIS — I872 Venous insufficiency (chronic) (peripheral): Secondary | ICD-10-CM | POA: Diagnosis not present

## 2017-01-17 DIAGNOSIS — Z880 Allergy status to penicillin: Secondary | ICD-10-CM | POA: Diagnosis not present

## 2017-01-17 DIAGNOSIS — L97312 Non-pressure chronic ulcer of right ankle with fat layer exposed: Secondary | ICD-10-CM | POA: Diagnosis not present

## 2017-01-17 DIAGNOSIS — L97212 Non-pressure chronic ulcer of right calf with fat layer exposed: Secondary | ICD-10-CM | POA: Diagnosis not present

## 2017-01-17 DIAGNOSIS — M86661 Other chronic osteomyelitis, right tibia and fibula: Secondary | ICD-10-CM | POA: Diagnosis not present

## 2017-01-17 DIAGNOSIS — S90512D Abrasion, left ankle, subsequent encounter: Secondary | ICD-10-CM | POA: Diagnosis not present

## 2017-01-17 DIAGNOSIS — M8668 Other chronic osteomyelitis, other site: Secondary | ICD-10-CM | POA: Diagnosis not present

## 2017-01-17 DIAGNOSIS — Z79899 Other long term (current) drug therapy: Secondary | ICD-10-CM | POA: Diagnosis not present

## 2017-01-17 DIAGNOSIS — S90812D Abrasion, left foot, subsequent encounter: Secondary | ICD-10-CM | POA: Diagnosis not present

## 2017-01-17 DIAGNOSIS — M1990 Unspecified osteoarthritis, unspecified site: Secondary | ICD-10-CM | POA: Diagnosis not present

## 2017-01-17 DIAGNOSIS — S90812A Abrasion, left foot, initial encounter: Secondary | ICD-10-CM | POA: Diagnosis not present

## 2017-01-17 DIAGNOSIS — L97512 Non-pressure chronic ulcer of other part of right foot with fat layer exposed: Secondary | ICD-10-CM | POA: Diagnosis not present

## 2017-01-17 DIAGNOSIS — Z886 Allergy status to analgesic agent status: Secondary | ICD-10-CM | POA: Diagnosis not present

## 2017-01-17 DIAGNOSIS — I10 Essential (primary) hypertension: Secondary | ICD-10-CM | POA: Diagnosis not present

## 2017-01-17 DIAGNOSIS — S90512A Abrasion, left ankle, initial encounter: Secondary | ICD-10-CM | POA: Diagnosis not present

## 2017-01-17 DIAGNOSIS — Z87891 Personal history of nicotine dependence: Secondary | ICD-10-CM | POA: Diagnosis not present

## 2017-01-18 DIAGNOSIS — I10 Essential (primary) hypertension: Secondary | ICD-10-CM | POA: Diagnosis not present

## 2017-01-18 DIAGNOSIS — S90512D Abrasion, left ankle, subsequent encounter: Secondary | ICD-10-CM | POA: Diagnosis not present

## 2017-01-18 DIAGNOSIS — S90812D Abrasion, left foot, subsequent encounter: Secondary | ICD-10-CM | POA: Diagnosis not present

## 2017-01-18 DIAGNOSIS — M86661 Other chronic osteomyelitis, right tibia and fibula: Secondary | ICD-10-CM | POA: Diagnosis not present

## 2017-01-18 DIAGNOSIS — M1990 Unspecified osteoarthritis, unspecified site: Secondary | ICD-10-CM | POA: Diagnosis not present

## 2017-01-18 DIAGNOSIS — L97512 Non-pressure chronic ulcer of other part of right foot with fat layer exposed: Secondary | ICD-10-CM | POA: Diagnosis not present

## 2017-01-19 DIAGNOSIS — I10 Essential (primary) hypertension: Secondary | ICD-10-CM | POA: Diagnosis not present

## 2017-01-19 DIAGNOSIS — S90812D Abrasion, left foot, subsequent encounter: Secondary | ICD-10-CM | POA: Diagnosis not present

## 2017-01-19 DIAGNOSIS — M8668 Other chronic osteomyelitis, other site: Secondary | ICD-10-CM | POA: Diagnosis not present

## 2017-01-19 DIAGNOSIS — M86661 Other chronic osteomyelitis, right tibia and fibula: Secondary | ICD-10-CM | POA: Diagnosis not present

## 2017-01-19 DIAGNOSIS — L97813 Non-pressure chronic ulcer of other part of right lower leg with necrosis of muscle: Secondary | ICD-10-CM | POA: Diagnosis not present

## 2017-01-19 DIAGNOSIS — L97829 Non-pressure chronic ulcer of other part of left lower leg with unspecified severity: Secondary | ICD-10-CM | POA: Diagnosis not present

## 2017-01-19 DIAGNOSIS — M86271 Subacute osteomyelitis, right ankle and foot: Secondary | ICD-10-CM | POA: Diagnosis not present

## 2017-01-19 DIAGNOSIS — S90512D Abrasion, left ankle, subsequent encounter: Secondary | ICD-10-CM | POA: Diagnosis not present

## 2017-01-19 DIAGNOSIS — L97521 Non-pressure chronic ulcer of other part of left foot limited to breakdown of skin: Secondary | ICD-10-CM | POA: Diagnosis not present

## 2017-01-19 DIAGNOSIS — M1990 Unspecified osteoarthritis, unspecified site: Secondary | ICD-10-CM | POA: Diagnosis not present

## 2017-01-19 DIAGNOSIS — L97512 Non-pressure chronic ulcer of other part of right foot with fat layer exposed: Secondary | ICD-10-CM | POA: Diagnosis not present

## 2017-01-19 DIAGNOSIS — M150 Primary generalized (osteo)arthritis: Secondary | ICD-10-CM | POA: Diagnosis not present

## 2017-01-19 DIAGNOSIS — L97322 Non-pressure chronic ulcer of left ankle with fat layer exposed: Secondary | ICD-10-CM | POA: Diagnosis not present

## 2017-01-22 DIAGNOSIS — S90812D Abrasion, left foot, subsequent encounter: Secondary | ICD-10-CM | POA: Diagnosis not present

## 2017-01-22 DIAGNOSIS — Z886 Allergy status to analgesic agent status: Secondary | ICD-10-CM | POA: Diagnosis not present

## 2017-01-22 DIAGNOSIS — I10 Essential (primary) hypertension: Secondary | ICD-10-CM | POA: Diagnosis not present

## 2017-01-22 DIAGNOSIS — Z87891 Personal history of nicotine dependence: Secondary | ICD-10-CM | POA: Diagnosis not present

## 2017-01-22 DIAGNOSIS — M86661 Other chronic osteomyelitis, right tibia and fibula: Secondary | ICD-10-CM | POA: Diagnosis not present

## 2017-01-22 DIAGNOSIS — S90512D Abrasion, left ankle, subsequent encounter: Secondary | ICD-10-CM | POA: Diagnosis not present

## 2017-01-22 DIAGNOSIS — M1990 Unspecified osteoarthritis, unspecified site: Secondary | ICD-10-CM | POA: Diagnosis not present

## 2017-01-22 DIAGNOSIS — M8668 Other chronic osteomyelitis, other site: Secondary | ICD-10-CM | POA: Diagnosis not present

## 2017-01-22 DIAGNOSIS — L97812 Non-pressure chronic ulcer of other part of right lower leg with fat layer exposed: Secondary | ICD-10-CM | POA: Diagnosis not present

## 2017-01-22 DIAGNOSIS — Z880 Allergy status to penicillin: Secondary | ICD-10-CM | POA: Diagnosis not present

## 2017-01-24 DIAGNOSIS — M86661 Other chronic osteomyelitis, right tibia and fibula: Secondary | ICD-10-CM | POA: Diagnosis not present

## 2017-01-25 DIAGNOSIS — L600 Ingrowing nail: Secondary | ICD-10-CM | POA: Diagnosis not present

## 2017-01-25 DIAGNOSIS — L97813 Non-pressure chronic ulcer of other part of right lower leg with necrosis of muscle: Secondary | ICD-10-CM | POA: Diagnosis not present

## 2017-01-25 DIAGNOSIS — M8668 Other chronic osteomyelitis, other site: Secondary | ICD-10-CM | POA: Diagnosis not present

## 2017-01-25 DIAGNOSIS — L84 Corns and callosities: Secondary | ICD-10-CM | POA: Diagnosis not present

## 2017-01-25 DIAGNOSIS — M150 Primary generalized (osteo)arthritis: Secondary | ICD-10-CM | POA: Diagnosis not present

## 2017-01-25 DIAGNOSIS — I739 Peripheral vascular disease, unspecified: Secondary | ICD-10-CM | POA: Diagnosis not present

## 2017-01-25 DIAGNOSIS — B351 Tinea unguium: Secondary | ICD-10-CM | POA: Diagnosis not present

## 2017-01-25 DIAGNOSIS — M79674 Pain in right toe(s): Secondary | ICD-10-CM | POA: Diagnosis not present

## 2017-01-25 DIAGNOSIS — M86271 Subacute osteomyelitis, right ankle and foot: Secondary | ICD-10-CM | POA: Diagnosis not present

## 2017-01-25 DIAGNOSIS — L97521 Non-pressure chronic ulcer of other part of left foot limited to breakdown of skin: Secondary | ICD-10-CM | POA: Diagnosis not present

## 2017-01-25 DIAGNOSIS — L97322 Non-pressure chronic ulcer of left ankle with fat layer exposed: Secondary | ICD-10-CM | POA: Diagnosis not present

## 2017-01-25 DIAGNOSIS — M79675 Pain in left toe(s): Secondary | ICD-10-CM | POA: Diagnosis not present

## 2017-01-25 DIAGNOSIS — L97829 Non-pressure chronic ulcer of other part of left lower leg with unspecified severity: Secondary | ICD-10-CM | POA: Diagnosis not present

## 2017-01-25 DIAGNOSIS — M86661 Other chronic osteomyelitis, right tibia and fibula: Secondary | ICD-10-CM | POA: Diagnosis not present

## 2017-01-25 DIAGNOSIS — L602 Onychogryphosis: Secondary | ICD-10-CM | POA: Diagnosis not present

## 2017-01-26 DIAGNOSIS — M86661 Other chronic osteomyelitis, right tibia and fibula: Secondary | ICD-10-CM | POA: Diagnosis not present

## 2017-01-29 DIAGNOSIS — I10 Essential (primary) hypertension: Secondary | ICD-10-CM | POA: Diagnosis not present

## 2017-01-29 DIAGNOSIS — L97212 Non-pressure chronic ulcer of right calf with fat layer exposed: Secondary | ICD-10-CM | POA: Diagnosis not present

## 2017-01-29 DIAGNOSIS — S90812D Abrasion, left foot, subsequent encounter: Secondary | ICD-10-CM | POA: Diagnosis not present

## 2017-01-29 DIAGNOSIS — S90512A Abrasion, left ankle, initial encounter: Secondary | ICD-10-CM | POA: Diagnosis not present

## 2017-01-29 DIAGNOSIS — Z87891 Personal history of nicotine dependence: Secondary | ICD-10-CM | POA: Diagnosis not present

## 2017-01-29 DIAGNOSIS — M1990 Unspecified osteoarthritis, unspecified site: Secondary | ICD-10-CM | POA: Diagnosis not present

## 2017-01-29 DIAGNOSIS — Z886 Allergy status to analgesic agent status: Secondary | ICD-10-CM | POA: Diagnosis not present

## 2017-01-29 DIAGNOSIS — I872 Venous insufficiency (chronic) (peripheral): Secondary | ICD-10-CM | POA: Diagnosis not present

## 2017-01-29 DIAGNOSIS — S90512D Abrasion, left ankle, subsequent encounter: Secondary | ICD-10-CM | POA: Diagnosis not present

## 2017-01-29 DIAGNOSIS — M86661 Other chronic osteomyelitis, right tibia and fibula: Secondary | ICD-10-CM | POA: Diagnosis not present

## 2017-01-29 DIAGNOSIS — S90812A Abrasion, left foot, initial encounter: Secondary | ICD-10-CM | POA: Diagnosis not present

## 2017-01-29 DIAGNOSIS — L97812 Non-pressure chronic ulcer of other part of right lower leg with fat layer exposed: Secondary | ICD-10-CM | POA: Diagnosis not present

## 2017-01-29 DIAGNOSIS — Z880 Allergy status to penicillin: Secondary | ICD-10-CM | POA: Diagnosis not present

## 2017-01-30 DIAGNOSIS — S90812D Abrasion, left foot, subsequent encounter: Secondary | ICD-10-CM | POA: Diagnosis not present

## 2017-01-30 DIAGNOSIS — L97322 Non-pressure chronic ulcer of left ankle with fat layer exposed: Secondary | ICD-10-CM | POA: Diagnosis not present

## 2017-01-30 DIAGNOSIS — S90512D Abrasion, left ankle, subsequent encounter: Secondary | ICD-10-CM | POA: Diagnosis not present

## 2017-01-30 DIAGNOSIS — M8668 Other chronic osteomyelitis, other site: Secondary | ICD-10-CM | POA: Diagnosis not present

## 2017-01-30 DIAGNOSIS — M86271 Subacute osteomyelitis, right ankle and foot: Secondary | ICD-10-CM | POA: Diagnosis not present

## 2017-01-30 DIAGNOSIS — M150 Primary generalized (osteo)arthritis: Secondary | ICD-10-CM | POA: Diagnosis not present

## 2017-01-30 DIAGNOSIS — L97813 Non-pressure chronic ulcer of other part of right lower leg with necrosis of muscle: Secondary | ICD-10-CM | POA: Diagnosis not present

## 2017-01-30 DIAGNOSIS — M86661 Other chronic osteomyelitis, right tibia and fibula: Secondary | ICD-10-CM | POA: Diagnosis not present

## 2017-01-30 DIAGNOSIS — I10 Essential (primary) hypertension: Secondary | ICD-10-CM | POA: Diagnosis not present

## 2017-01-30 DIAGNOSIS — L97521 Non-pressure chronic ulcer of other part of left foot limited to breakdown of skin: Secondary | ICD-10-CM | POA: Diagnosis not present

## 2017-01-30 DIAGNOSIS — L97829 Non-pressure chronic ulcer of other part of left lower leg with unspecified severity: Secondary | ICD-10-CM | POA: Diagnosis not present

## 2017-01-30 DIAGNOSIS — L97812 Non-pressure chronic ulcer of other part of right lower leg with fat layer exposed: Secondary | ICD-10-CM | POA: Diagnosis not present

## 2017-01-30 DIAGNOSIS — M1990 Unspecified osteoarthritis, unspecified site: Secondary | ICD-10-CM | POA: Diagnosis not present

## 2017-01-31 DIAGNOSIS — I83891 Varicose veins of right lower extremities with other complications: Secondary | ICD-10-CM | POA: Diagnosis not present

## 2017-01-31 DIAGNOSIS — Z87891 Personal history of nicotine dependence: Secondary | ICD-10-CM | POA: Diagnosis not present

## 2017-01-31 DIAGNOSIS — Z880 Allergy status to penicillin: Secondary | ICD-10-CM | POA: Diagnosis not present

## 2017-01-31 DIAGNOSIS — Z888 Allergy status to other drugs, medicaments and biological substances status: Secondary | ICD-10-CM | POA: Diagnosis not present

## 2017-01-31 DIAGNOSIS — M1990 Unspecified osteoarthritis, unspecified site: Secondary | ICD-10-CM | POA: Diagnosis not present

## 2017-01-31 DIAGNOSIS — M8668 Other chronic osteomyelitis, other site: Secondary | ICD-10-CM | POA: Diagnosis not present

## 2017-01-31 DIAGNOSIS — L97329 Non-pressure chronic ulcer of left ankle with unspecified severity: Secondary | ICD-10-CM | POA: Diagnosis not present

## 2017-01-31 DIAGNOSIS — M86661 Other chronic osteomyelitis, right tibia and fibula: Secondary | ICD-10-CM | POA: Diagnosis not present

## 2017-01-31 DIAGNOSIS — I10 Essential (primary) hypertension: Secondary | ICD-10-CM | POA: Diagnosis not present

## 2017-01-31 DIAGNOSIS — S90512D Abrasion, left ankle, subsequent encounter: Secondary | ICD-10-CM | POA: Diagnosis not present

## 2017-01-31 DIAGNOSIS — I87309 Chronic venous hypertension (idiopathic) without complications of unspecified lower extremity: Secondary | ICD-10-CM | POA: Diagnosis not present

## 2017-01-31 DIAGNOSIS — I87333 Chronic venous hypertension (idiopathic) with ulcer and inflammation of bilateral lower extremity: Secondary | ICD-10-CM | POA: Diagnosis not present

## 2017-01-31 DIAGNOSIS — M868X8 Other osteomyelitis, other site: Secondary | ICD-10-CM | POA: Diagnosis not present

## 2017-01-31 DIAGNOSIS — L97812 Non-pressure chronic ulcer of other part of right lower leg with fat layer exposed: Secondary | ICD-10-CM | POA: Diagnosis not present

## 2017-01-31 DIAGNOSIS — S90812D Abrasion, left foot, subsequent encounter: Secondary | ICD-10-CM | POA: Diagnosis not present

## 2017-01-31 DIAGNOSIS — Z79899 Other long term (current) drug therapy: Secondary | ICD-10-CM | POA: Diagnosis not present

## 2017-01-31 DIAGNOSIS — Z886 Allergy status to analgesic agent status: Secondary | ICD-10-CM | POA: Diagnosis not present

## 2017-01-31 DIAGNOSIS — I872 Venous insufficiency (chronic) (peripheral): Secondary | ICD-10-CM | POA: Diagnosis not present

## 2017-01-31 DIAGNOSIS — L97219 Non-pressure chronic ulcer of right calf with unspecified severity: Secondary | ICD-10-CM | POA: Diagnosis not present

## 2017-02-01 DIAGNOSIS — M86661 Other chronic osteomyelitis, right tibia and fibula: Secondary | ICD-10-CM | POA: Diagnosis not present

## 2017-02-01 DIAGNOSIS — I10 Essential (primary) hypertension: Secondary | ICD-10-CM | POA: Diagnosis not present

## 2017-02-01 DIAGNOSIS — S90512D Abrasion, left ankle, subsequent encounter: Secondary | ICD-10-CM | POA: Diagnosis not present

## 2017-02-01 DIAGNOSIS — S90812D Abrasion, left foot, subsequent encounter: Secondary | ICD-10-CM | POA: Diagnosis not present

## 2017-02-01 DIAGNOSIS — M1990 Unspecified osteoarthritis, unspecified site: Secondary | ICD-10-CM | POA: Diagnosis not present

## 2017-02-01 DIAGNOSIS — L97812 Non-pressure chronic ulcer of other part of right lower leg with fat layer exposed: Secondary | ICD-10-CM | POA: Diagnosis not present

## 2017-02-02 DIAGNOSIS — M868X8 Other osteomyelitis, other site: Secondary | ICD-10-CM | POA: Diagnosis not present

## 2017-02-02 DIAGNOSIS — L97329 Non-pressure chronic ulcer of left ankle with unspecified severity: Secondary | ICD-10-CM | POA: Diagnosis not present

## 2017-02-02 DIAGNOSIS — I87309 Chronic venous hypertension (idiopathic) without complications of unspecified lower extremity: Secondary | ICD-10-CM | POA: Diagnosis not present

## 2017-02-02 DIAGNOSIS — Z888 Allergy status to other drugs, medicaments and biological substances status: Secondary | ICD-10-CM | POA: Diagnosis not present

## 2017-02-02 DIAGNOSIS — M1990 Unspecified osteoarthritis, unspecified site: Secondary | ICD-10-CM | POA: Diagnosis not present

## 2017-02-02 DIAGNOSIS — S90812D Abrasion, left foot, subsequent encounter: Secondary | ICD-10-CM | POA: Diagnosis not present

## 2017-02-02 DIAGNOSIS — I10 Essential (primary) hypertension: Secondary | ICD-10-CM | POA: Diagnosis not present

## 2017-02-02 DIAGNOSIS — Z880 Allergy status to penicillin: Secondary | ICD-10-CM | POA: Diagnosis not present

## 2017-02-02 DIAGNOSIS — I872 Venous insufficiency (chronic) (peripheral): Secondary | ICD-10-CM | POA: Diagnosis not present

## 2017-02-02 DIAGNOSIS — M86661 Other chronic osteomyelitis, right tibia and fibula: Secondary | ICD-10-CM | POA: Diagnosis not present

## 2017-02-02 DIAGNOSIS — L97812 Non-pressure chronic ulcer of other part of right lower leg with fat layer exposed: Secondary | ICD-10-CM | POA: Diagnosis not present

## 2017-02-02 DIAGNOSIS — M8668 Other chronic osteomyelitis, other site: Secondary | ICD-10-CM | POA: Diagnosis not present

## 2017-02-02 DIAGNOSIS — Z79899 Other long term (current) drug therapy: Secondary | ICD-10-CM | POA: Diagnosis not present

## 2017-02-02 DIAGNOSIS — Z48812 Encounter for surgical aftercare following surgery on the circulatory system: Secondary | ICD-10-CM | POA: Diagnosis not present

## 2017-02-02 DIAGNOSIS — L97219 Non-pressure chronic ulcer of right calf with unspecified severity: Secondary | ICD-10-CM | POA: Diagnosis not present

## 2017-02-02 DIAGNOSIS — I83892 Varicose veins of left lower extremities with other complications: Secondary | ICD-10-CM | POA: Diagnosis not present

## 2017-02-02 DIAGNOSIS — S90512D Abrasion, left ankle, subsequent encounter: Secondary | ICD-10-CM | POA: Diagnosis not present

## 2017-02-02 DIAGNOSIS — I87313 Chronic venous hypertension (idiopathic) with ulcer of bilateral lower extremity: Secondary | ICD-10-CM | POA: Diagnosis not present

## 2017-02-06 DIAGNOSIS — M86671 Other chronic osteomyelitis, right ankle and foot: Secondary | ICD-10-CM | POA: Diagnosis not present

## 2017-02-06 DIAGNOSIS — S90812D Abrasion, left foot, subsequent encounter: Secondary | ICD-10-CM | POA: Diagnosis not present

## 2017-02-06 DIAGNOSIS — M86661 Other chronic osteomyelitis, right tibia and fibula: Secondary | ICD-10-CM | POA: Diagnosis not present

## 2017-02-06 DIAGNOSIS — Z880 Allergy status to penicillin: Secondary | ICD-10-CM | POA: Diagnosis not present

## 2017-02-06 DIAGNOSIS — I872 Venous insufficiency (chronic) (peripheral): Secondary | ICD-10-CM | POA: Diagnosis not present

## 2017-02-06 DIAGNOSIS — L97212 Non-pressure chronic ulcer of right calf with fat layer exposed: Secondary | ICD-10-CM | POA: Diagnosis not present

## 2017-02-06 DIAGNOSIS — I10 Essential (primary) hypertension: Secondary | ICD-10-CM | POA: Diagnosis not present

## 2017-02-06 DIAGNOSIS — Z886 Allergy status to analgesic agent status: Secondary | ICD-10-CM | POA: Diagnosis not present

## 2017-02-06 DIAGNOSIS — Z9889 Other specified postprocedural states: Secondary | ICD-10-CM | POA: Diagnosis not present

## 2017-02-06 DIAGNOSIS — S90512D Abrasion, left ankle, subsequent encounter: Secondary | ICD-10-CM | POA: Diagnosis not present

## 2017-02-06 DIAGNOSIS — S90512A Abrasion, left ankle, initial encounter: Secondary | ICD-10-CM | POA: Diagnosis not present

## 2017-02-06 DIAGNOSIS — Z87891 Personal history of nicotine dependence: Secondary | ICD-10-CM | POA: Diagnosis not present

## 2017-02-06 DIAGNOSIS — S90812A Abrasion, left foot, initial encounter: Secondary | ICD-10-CM | POA: Diagnosis not present

## 2017-02-06 DIAGNOSIS — S81812D Laceration without foreign body, left lower leg, subsequent encounter: Secondary | ICD-10-CM | POA: Diagnosis not present

## 2017-02-06 DIAGNOSIS — S81812A Laceration without foreign body, left lower leg, initial encounter: Secondary | ICD-10-CM | POA: Diagnosis not present

## 2017-02-06 DIAGNOSIS — M1990 Unspecified osteoarthritis, unspecified site: Secondary | ICD-10-CM | POA: Diagnosis not present

## 2017-02-06 DIAGNOSIS — L97812 Non-pressure chronic ulcer of other part of right lower leg with fat layer exposed: Secondary | ICD-10-CM | POA: Diagnosis not present

## 2017-02-07 DIAGNOSIS — M8668 Other chronic osteomyelitis, other site: Secondary | ICD-10-CM | POA: Diagnosis not present

## 2017-02-07 DIAGNOSIS — M86661 Other chronic osteomyelitis, right tibia and fibula: Secondary | ICD-10-CM | POA: Diagnosis not present

## 2017-02-07 DIAGNOSIS — S90512D Abrasion, left ankle, subsequent encounter: Secondary | ICD-10-CM | POA: Diagnosis not present

## 2017-02-07 DIAGNOSIS — S81812D Laceration without foreign body, left lower leg, subsequent encounter: Secondary | ICD-10-CM | POA: Diagnosis not present

## 2017-02-07 DIAGNOSIS — M86671 Other chronic osteomyelitis, right ankle and foot: Secondary | ICD-10-CM | POA: Diagnosis not present

## 2017-02-07 DIAGNOSIS — S90812D Abrasion, left foot, subsequent encounter: Secondary | ICD-10-CM | POA: Diagnosis not present

## 2017-02-07 DIAGNOSIS — L97812 Non-pressure chronic ulcer of other part of right lower leg with fat layer exposed: Secondary | ICD-10-CM | POA: Diagnosis not present

## 2017-02-08 DIAGNOSIS — M86661 Other chronic osteomyelitis, right tibia and fibula: Secondary | ICD-10-CM | POA: Diagnosis not present

## 2017-02-08 DIAGNOSIS — S90512D Abrasion, left ankle, subsequent encounter: Secondary | ICD-10-CM | POA: Diagnosis not present

## 2017-02-08 DIAGNOSIS — M86671 Other chronic osteomyelitis, right ankle and foot: Secondary | ICD-10-CM | POA: Diagnosis not present

## 2017-02-08 DIAGNOSIS — M8668 Other chronic osteomyelitis, other site: Secondary | ICD-10-CM | POA: Diagnosis not present

## 2017-02-08 DIAGNOSIS — S81812D Laceration without foreign body, left lower leg, subsequent encounter: Secondary | ICD-10-CM | POA: Diagnosis not present

## 2017-02-08 DIAGNOSIS — L97812 Non-pressure chronic ulcer of other part of right lower leg with fat layer exposed: Secondary | ICD-10-CM | POA: Diagnosis not present

## 2017-02-08 DIAGNOSIS — S90812D Abrasion, left foot, subsequent encounter: Secondary | ICD-10-CM | POA: Diagnosis not present

## 2017-02-09 DIAGNOSIS — M86661 Other chronic osteomyelitis, right tibia and fibula: Secondary | ICD-10-CM | POA: Diagnosis not present

## 2017-02-09 DIAGNOSIS — L97521 Non-pressure chronic ulcer of other part of left foot limited to breakdown of skin: Secondary | ICD-10-CM | POA: Diagnosis not present

## 2017-02-09 DIAGNOSIS — M8668 Other chronic osteomyelitis, other site: Secondary | ICD-10-CM | POA: Diagnosis not present

## 2017-02-09 DIAGNOSIS — S90812D Abrasion, left foot, subsequent encounter: Secondary | ICD-10-CM | POA: Diagnosis not present

## 2017-02-09 DIAGNOSIS — M150 Primary generalized (osteo)arthritis: Secondary | ICD-10-CM | POA: Diagnosis not present

## 2017-02-09 DIAGNOSIS — L97812 Non-pressure chronic ulcer of other part of right lower leg with fat layer exposed: Secondary | ICD-10-CM | POA: Diagnosis not present

## 2017-02-09 DIAGNOSIS — S90512D Abrasion, left ankle, subsequent encounter: Secondary | ICD-10-CM | POA: Diagnosis not present

## 2017-02-09 DIAGNOSIS — M86671 Other chronic osteomyelitis, right ankle and foot: Secondary | ICD-10-CM | POA: Diagnosis not present

## 2017-02-09 DIAGNOSIS — L97829 Non-pressure chronic ulcer of other part of left lower leg with unspecified severity: Secondary | ICD-10-CM | POA: Diagnosis not present

## 2017-02-09 DIAGNOSIS — I872 Venous insufficiency (chronic) (peripheral): Secondary | ICD-10-CM | POA: Diagnosis not present

## 2017-02-09 DIAGNOSIS — L97322 Non-pressure chronic ulcer of left ankle with fat layer exposed: Secondary | ICD-10-CM | POA: Diagnosis not present

## 2017-02-09 DIAGNOSIS — L97813 Non-pressure chronic ulcer of other part of right lower leg with necrosis of muscle: Secondary | ICD-10-CM | POA: Diagnosis not present

## 2017-02-09 DIAGNOSIS — M86271 Subacute osteomyelitis, right ankle and foot: Secondary | ICD-10-CM | POA: Diagnosis not present

## 2017-02-09 DIAGNOSIS — Z9889 Other specified postprocedural states: Secondary | ICD-10-CM | POA: Diagnosis not present

## 2017-02-09 DIAGNOSIS — S81812D Laceration without foreign body, left lower leg, subsequent encounter: Secondary | ICD-10-CM | POA: Diagnosis not present

## 2017-02-12 DIAGNOSIS — L97212 Non-pressure chronic ulcer of right calf with fat layer exposed: Secondary | ICD-10-CM | POA: Diagnosis not present

## 2017-02-12 DIAGNOSIS — S90512A Abrasion, left ankle, initial encounter: Secondary | ICD-10-CM | POA: Diagnosis not present

## 2017-02-12 DIAGNOSIS — M1990 Unspecified osteoarthritis, unspecified site: Secondary | ICD-10-CM | POA: Diagnosis not present

## 2017-02-12 DIAGNOSIS — Z880 Allergy status to penicillin: Secondary | ICD-10-CM | POA: Diagnosis not present

## 2017-02-12 DIAGNOSIS — S90812A Abrasion, left foot, initial encounter: Secondary | ICD-10-CM | POA: Diagnosis not present

## 2017-02-12 DIAGNOSIS — S90512D Abrasion, left ankle, subsequent encounter: Secondary | ICD-10-CM | POA: Diagnosis not present

## 2017-02-12 DIAGNOSIS — I10 Essential (primary) hypertension: Secondary | ICD-10-CM | POA: Diagnosis not present

## 2017-02-12 DIAGNOSIS — Z87891 Personal history of nicotine dependence: Secondary | ICD-10-CM | POA: Diagnosis not present

## 2017-02-12 DIAGNOSIS — S81812A Laceration without foreign body, left lower leg, initial encounter: Secondary | ICD-10-CM | POA: Diagnosis not present

## 2017-02-12 DIAGNOSIS — S81812D Laceration without foreign body, left lower leg, subsequent encounter: Secondary | ICD-10-CM | POA: Diagnosis not present

## 2017-02-12 DIAGNOSIS — M86661 Other chronic osteomyelitis, right tibia and fibula: Secondary | ICD-10-CM | POA: Diagnosis not present

## 2017-02-12 DIAGNOSIS — S90812D Abrasion, left foot, subsequent encounter: Secondary | ICD-10-CM | POA: Diagnosis not present

## 2017-02-12 DIAGNOSIS — L97812 Non-pressure chronic ulcer of other part of right lower leg with fat layer exposed: Secondary | ICD-10-CM | POA: Diagnosis not present

## 2017-02-12 DIAGNOSIS — M8668 Other chronic osteomyelitis, other site: Secondary | ICD-10-CM | POA: Diagnosis not present

## 2017-02-12 DIAGNOSIS — Z886 Allergy status to analgesic agent status: Secondary | ICD-10-CM | POA: Diagnosis not present

## 2017-02-12 DIAGNOSIS — I872 Venous insufficiency (chronic) (peripheral): Secondary | ICD-10-CM | POA: Diagnosis not present

## 2017-02-13 DIAGNOSIS — S81812D Laceration without foreign body, left lower leg, subsequent encounter: Secondary | ICD-10-CM | POA: Diagnosis not present

## 2017-02-13 DIAGNOSIS — M86661 Other chronic osteomyelitis, right tibia and fibula: Secondary | ICD-10-CM | POA: Diagnosis not present

## 2017-02-13 DIAGNOSIS — S90812D Abrasion, left foot, subsequent encounter: Secondary | ICD-10-CM | POA: Diagnosis not present

## 2017-02-13 DIAGNOSIS — L97521 Non-pressure chronic ulcer of other part of left foot limited to breakdown of skin: Secondary | ICD-10-CM | POA: Diagnosis not present

## 2017-02-13 DIAGNOSIS — I10 Essential (primary) hypertension: Secondary | ICD-10-CM | POA: Diagnosis not present

## 2017-02-13 DIAGNOSIS — M8668 Other chronic osteomyelitis, other site: Secondary | ICD-10-CM | POA: Diagnosis not present

## 2017-02-13 DIAGNOSIS — L97829 Non-pressure chronic ulcer of other part of left lower leg with unspecified severity: Secondary | ICD-10-CM | POA: Diagnosis not present

## 2017-02-13 DIAGNOSIS — M150 Primary generalized (osteo)arthritis: Secondary | ICD-10-CM | POA: Diagnosis not present

## 2017-02-13 DIAGNOSIS — M86271 Subacute osteomyelitis, right ankle and foot: Secondary | ICD-10-CM | POA: Diagnosis not present

## 2017-02-13 DIAGNOSIS — L97813 Non-pressure chronic ulcer of other part of right lower leg with necrosis of muscle: Secondary | ICD-10-CM | POA: Diagnosis not present

## 2017-02-13 DIAGNOSIS — S90512D Abrasion, left ankle, subsequent encounter: Secondary | ICD-10-CM | POA: Diagnosis not present

## 2017-02-13 DIAGNOSIS — L97812 Non-pressure chronic ulcer of other part of right lower leg with fat layer exposed: Secondary | ICD-10-CM | POA: Diagnosis not present

## 2017-02-13 DIAGNOSIS — L97322 Non-pressure chronic ulcer of left ankle with fat layer exposed: Secondary | ICD-10-CM | POA: Diagnosis not present

## 2017-02-14 DIAGNOSIS — M86661 Other chronic osteomyelitis, right tibia and fibula: Secondary | ICD-10-CM | POA: Diagnosis not present

## 2017-02-14 DIAGNOSIS — I10 Essential (primary) hypertension: Secondary | ICD-10-CM | POA: Diagnosis not present

## 2017-02-14 DIAGNOSIS — S90812D Abrasion, left foot, subsequent encounter: Secondary | ICD-10-CM | POA: Diagnosis not present

## 2017-02-14 DIAGNOSIS — M8668 Other chronic osteomyelitis, other site: Secondary | ICD-10-CM | POA: Diagnosis not present

## 2017-02-14 DIAGNOSIS — L97812 Non-pressure chronic ulcer of other part of right lower leg with fat layer exposed: Secondary | ICD-10-CM | POA: Diagnosis not present

## 2017-02-14 DIAGNOSIS — S90512D Abrasion, left ankle, subsequent encounter: Secondary | ICD-10-CM | POA: Diagnosis not present

## 2017-02-14 DIAGNOSIS — S81812D Laceration without foreign body, left lower leg, subsequent encounter: Secondary | ICD-10-CM | POA: Diagnosis not present

## 2017-02-15 DIAGNOSIS — S90812D Abrasion, left foot, subsequent encounter: Secondary | ICD-10-CM | POA: Diagnosis not present

## 2017-02-15 DIAGNOSIS — Z880 Allergy status to penicillin: Secondary | ICD-10-CM | POA: Diagnosis not present

## 2017-02-15 DIAGNOSIS — I872 Venous insufficiency (chronic) (peripheral): Secondary | ICD-10-CM | POA: Diagnosis not present

## 2017-02-15 DIAGNOSIS — Z87891 Personal history of nicotine dependence: Secondary | ICD-10-CM | POA: Diagnosis not present

## 2017-02-15 DIAGNOSIS — Z79899 Other long term (current) drug therapy: Secondary | ICD-10-CM | POA: Diagnosis not present

## 2017-02-15 DIAGNOSIS — M8668 Other chronic osteomyelitis, other site: Secondary | ICD-10-CM | POA: Diagnosis not present

## 2017-02-15 DIAGNOSIS — M1990 Unspecified osteoarthritis, unspecified site: Secondary | ICD-10-CM | POA: Diagnosis not present

## 2017-02-15 DIAGNOSIS — M869 Osteomyelitis, unspecified: Secondary | ICD-10-CM | POA: Diagnosis not present

## 2017-02-15 DIAGNOSIS — S90512D Abrasion, left ankle, subsequent encounter: Secondary | ICD-10-CM | POA: Diagnosis not present

## 2017-02-15 DIAGNOSIS — L97812 Non-pressure chronic ulcer of other part of right lower leg with fat layer exposed: Secondary | ICD-10-CM | POA: Diagnosis not present

## 2017-02-15 DIAGNOSIS — M86661 Other chronic osteomyelitis, right tibia and fibula: Secondary | ICD-10-CM | POA: Diagnosis not present

## 2017-02-15 DIAGNOSIS — I10 Essential (primary) hypertension: Secondary | ICD-10-CM | POA: Diagnosis not present

## 2017-02-15 DIAGNOSIS — S81812D Laceration without foreign body, left lower leg, subsequent encounter: Secondary | ICD-10-CM | POA: Diagnosis not present

## 2017-02-15 DIAGNOSIS — I83891 Varicose veins of right lower extremities with other complications: Secondary | ICD-10-CM | POA: Diagnosis not present

## 2017-02-15 DIAGNOSIS — Z886 Allergy status to analgesic agent status: Secondary | ICD-10-CM | POA: Diagnosis not present

## 2017-02-16 DIAGNOSIS — S90512D Abrasion, left ankle, subsequent encounter: Secondary | ICD-10-CM | POA: Diagnosis not present

## 2017-02-16 DIAGNOSIS — L97521 Non-pressure chronic ulcer of other part of left foot limited to breakdown of skin: Secondary | ICD-10-CM | POA: Diagnosis not present

## 2017-02-16 DIAGNOSIS — M150 Primary generalized (osteo)arthritis: Secondary | ICD-10-CM | POA: Diagnosis not present

## 2017-02-16 DIAGNOSIS — L97812 Non-pressure chronic ulcer of other part of right lower leg with fat layer exposed: Secondary | ICD-10-CM | POA: Diagnosis not present

## 2017-02-16 DIAGNOSIS — M8668 Other chronic osteomyelitis, other site: Secondary | ICD-10-CM | POA: Diagnosis not present

## 2017-02-16 DIAGNOSIS — S90812D Abrasion, left foot, subsequent encounter: Secondary | ICD-10-CM | POA: Diagnosis not present

## 2017-02-16 DIAGNOSIS — M86661 Other chronic osteomyelitis, right tibia and fibula: Secondary | ICD-10-CM | POA: Diagnosis not present

## 2017-02-16 DIAGNOSIS — S81812D Laceration without foreign body, left lower leg, subsequent encounter: Secondary | ICD-10-CM | POA: Diagnosis not present

## 2017-02-16 DIAGNOSIS — L97813 Non-pressure chronic ulcer of other part of right lower leg with necrosis of muscle: Secondary | ICD-10-CM | POA: Diagnosis not present

## 2017-02-16 DIAGNOSIS — I10 Essential (primary) hypertension: Secondary | ICD-10-CM | POA: Diagnosis not present

## 2017-02-16 DIAGNOSIS — M86271 Subacute osteomyelitis, right ankle and foot: Secondary | ICD-10-CM | POA: Diagnosis not present

## 2017-02-16 DIAGNOSIS — L97322 Non-pressure chronic ulcer of left ankle with fat layer exposed: Secondary | ICD-10-CM | POA: Diagnosis not present

## 2017-02-16 DIAGNOSIS — L97829 Non-pressure chronic ulcer of other part of left lower leg with unspecified severity: Secondary | ICD-10-CM | POA: Diagnosis not present

## 2017-02-19 DIAGNOSIS — L97212 Non-pressure chronic ulcer of right calf with fat layer exposed: Secondary | ICD-10-CM | POA: Diagnosis not present

## 2017-02-19 DIAGNOSIS — I10 Essential (primary) hypertension: Secondary | ICD-10-CM | POA: Diagnosis not present

## 2017-02-19 DIAGNOSIS — S90812D Abrasion, left foot, subsequent encounter: Secondary | ICD-10-CM | POA: Diagnosis not present

## 2017-02-19 DIAGNOSIS — S91002A Unspecified open wound, left ankle, initial encounter: Secondary | ICD-10-CM | POA: Diagnosis not present

## 2017-02-19 DIAGNOSIS — Z886 Allergy status to analgesic agent status: Secondary | ICD-10-CM | POA: Diagnosis not present

## 2017-02-19 DIAGNOSIS — Z880 Allergy status to penicillin: Secondary | ICD-10-CM | POA: Diagnosis not present

## 2017-02-19 DIAGNOSIS — Z48812 Encounter for surgical aftercare following surgery on the circulatory system: Secondary | ICD-10-CM | POA: Diagnosis not present

## 2017-02-19 DIAGNOSIS — S81812D Laceration without foreign body, left lower leg, subsequent encounter: Secondary | ICD-10-CM | POA: Diagnosis not present

## 2017-02-19 DIAGNOSIS — I872 Venous insufficiency (chronic) (peripheral): Secondary | ICD-10-CM | POA: Diagnosis not present

## 2017-02-19 DIAGNOSIS — L97812 Non-pressure chronic ulcer of other part of right lower leg with fat layer exposed: Secondary | ICD-10-CM | POA: Diagnosis not present

## 2017-02-19 DIAGNOSIS — S91302A Unspecified open wound, left foot, initial encounter: Secondary | ICD-10-CM | POA: Diagnosis not present

## 2017-02-19 DIAGNOSIS — S81812A Laceration without foreign body, left lower leg, initial encounter: Secondary | ICD-10-CM | POA: Diagnosis not present

## 2017-02-19 DIAGNOSIS — M1990 Unspecified osteoarthritis, unspecified site: Secondary | ICD-10-CM | POA: Diagnosis not present

## 2017-02-19 DIAGNOSIS — Z87891 Personal history of nicotine dependence: Secondary | ICD-10-CM | POA: Diagnosis not present

## 2017-02-19 DIAGNOSIS — M86662 Other chronic osteomyelitis, left tibia and fibula: Secondary | ICD-10-CM | POA: Diagnosis not present

## 2017-02-19 DIAGNOSIS — M86661 Other chronic osteomyelitis, right tibia and fibula: Secondary | ICD-10-CM | POA: Diagnosis not present

## 2017-02-19 DIAGNOSIS — S90512D Abrasion, left ankle, subsequent encounter: Secondary | ICD-10-CM | POA: Diagnosis not present

## 2017-02-20 DIAGNOSIS — L97812 Non-pressure chronic ulcer of other part of right lower leg with fat layer exposed: Secondary | ICD-10-CM | POA: Diagnosis not present

## 2017-02-20 DIAGNOSIS — S90512D Abrasion, left ankle, subsequent encounter: Secondary | ICD-10-CM | POA: Diagnosis not present

## 2017-02-20 DIAGNOSIS — M8668 Other chronic osteomyelitis, other site: Secondary | ICD-10-CM | POA: Diagnosis not present

## 2017-02-20 DIAGNOSIS — M86661 Other chronic osteomyelitis, right tibia and fibula: Secondary | ICD-10-CM | POA: Diagnosis not present

## 2017-02-20 DIAGNOSIS — I10 Essential (primary) hypertension: Secondary | ICD-10-CM | POA: Diagnosis not present

## 2017-02-20 DIAGNOSIS — Z48812 Encounter for surgical aftercare following surgery on the circulatory system: Secondary | ICD-10-CM | POA: Diagnosis not present

## 2017-02-20 DIAGNOSIS — S90812D Abrasion, left foot, subsequent encounter: Secondary | ICD-10-CM | POA: Diagnosis not present

## 2017-02-20 DIAGNOSIS — I872 Venous insufficiency (chronic) (peripheral): Secondary | ICD-10-CM | POA: Diagnosis not present

## 2017-02-20 DIAGNOSIS — S81812D Laceration without foreign body, left lower leg, subsequent encounter: Secondary | ICD-10-CM | POA: Diagnosis not present

## 2017-02-21 DIAGNOSIS — L97322 Non-pressure chronic ulcer of left ankle with fat layer exposed: Secondary | ICD-10-CM | POA: Diagnosis not present

## 2017-02-21 DIAGNOSIS — M86371 Chronic multifocal osteomyelitis, right ankle and foot: Secondary | ICD-10-CM | POA: Diagnosis not present

## 2017-02-21 DIAGNOSIS — L97829 Non-pressure chronic ulcer of other part of left lower leg with unspecified severity: Secondary | ICD-10-CM | POA: Diagnosis not present

## 2017-02-21 DIAGNOSIS — M86271 Subacute osteomyelitis, right ankle and foot: Secondary | ICD-10-CM | POA: Diagnosis not present

## 2017-02-21 DIAGNOSIS — L97813 Non-pressure chronic ulcer of other part of right lower leg with necrosis of muscle: Secondary | ICD-10-CM | POA: Diagnosis not present

## 2017-02-21 DIAGNOSIS — M86361 Chronic multifocal osteomyelitis, right tibia and fibula: Secondary | ICD-10-CM | POA: Diagnosis not present

## 2017-02-21 DIAGNOSIS — L97521 Non-pressure chronic ulcer of other part of left foot limited to breakdown of skin: Secondary | ICD-10-CM | POA: Diagnosis not present

## 2017-02-21 DIAGNOSIS — M150 Primary generalized (osteo)arthritis: Secondary | ICD-10-CM | POA: Diagnosis not present

## 2017-02-23 DIAGNOSIS — L97829 Non-pressure chronic ulcer of other part of left lower leg with unspecified severity: Secondary | ICD-10-CM | POA: Diagnosis not present

## 2017-02-23 DIAGNOSIS — L97322 Non-pressure chronic ulcer of left ankle with fat layer exposed: Secondary | ICD-10-CM | POA: Diagnosis not present

## 2017-02-23 DIAGNOSIS — L97521 Non-pressure chronic ulcer of other part of left foot limited to breakdown of skin: Secondary | ICD-10-CM | POA: Diagnosis not present

## 2017-02-23 DIAGNOSIS — M86271 Subacute osteomyelitis, right ankle and foot: Secondary | ICD-10-CM | POA: Diagnosis not present

## 2017-02-23 DIAGNOSIS — M150 Primary generalized (osteo)arthritis: Secondary | ICD-10-CM | POA: Diagnosis not present

## 2017-02-23 DIAGNOSIS — L97813 Non-pressure chronic ulcer of other part of right lower leg with necrosis of muscle: Secondary | ICD-10-CM | POA: Diagnosis not present

## 2017-02-26 DIAGNOSIS — I10 Essential (primary) hypertension: Secondary | ICD-10-CM | POA: Diagnosis not present

## 2017-02-26 DIAGNOSIS — S90812D Abrasion, left foot, subsequent encounter: Secondary | ICD-10-CM | POA: Diagnosis not present

## 2017-02-26 DIAGNOSIS — M86661 Other chronic osteomyelitis, right tibia and fibula: Secondary | ICD-10-CM | POA: Diagnosis not present

## 2017-02-26 DIAGNOSIS — Z886 Allergy status to analgesic agent status: Secondary | ICD-10-CM | POA: Diagnosis not present

## 2017-02-26 DIAGNOSIS — L97812 Non-pressure chronic ulcer of other part of right lower leg with fat layer exposed: Secondary | ICD-10-CM | POA: Diagnosis not present

## 2017-02-26 DIAGNOSIS — S90512A Abrasion, left ankle, initial encounter: Secondary | ICD-10-CM | POA: Diagnosis not present

## 2017-02-26 DIAGNOSIS — S90512D Abrasion, left ankle, subsequent encounter: Secondary | ICD-10-CM | POA: Diagnosis not present

## 2017-02-26 DIAGNOSIS — L97212 Non-pressure chronic ulcer of right calf with fat layer exposed: Secondary | ICD-10-CM | POA: Diagnosis not present

## 2017-02-26 DIAGNOSIS — I872 Venous insufficiency (chronic) (peripheral): Secondary | ICD-10-CM | POA: Diagnosis not present

## 2017-02-26 DIAGNOSIS — Z87891 Personal history of nicotine dependence: Secondary | ICD-10-CM | POA: Diagnosis not present

## 2017-02-26 DIAGNOSIS — M1990 Unspecified osteoarthritis, unspecified site: Secondary | ICD-10-CM | POA: Diagnosis not present

## 2017-02-26 DIAGNOSIS — S81812A Laceration without foreign body, left lower leg, initial encounter: Secondary | ICD-10-CM | POA: Diagnosis not present

## 2017-02-26 DIAGNOSIS — S90812A Abrasion, left foot, initial encounter: Secondary | ICD-10-CM | POA: Diagnosis not present

## 2017-02-26 DIAGNOSIS — L97909 Non-pressure chronic ulcer of unspecified part of unspecified lower leg with unspecified severity: Secondary | ICD-10-CM | POA: Diagnosis not present

## 2017-02-26 DIAGNOSIS — M2550 Pain in unspecified joint: Secondary | ICD-10-CM | POA: Diagnosis not present

## 2017-02-26 DIAGNOSIS — M869 Osteomyelitis, unspecified: Secondary | ICD-10-CM | POA: Diagnosis not present

## 2017-02-26 DIAGNOSIS — E7800 Pure hypercholesterolemia, unspecified: Secondary | ICD-10-CM | POA: Diagnosis not present

## 2017-02-26 DIAGNOSIS — Z880 Allergy status to penicillin: Secondary | ICD-10-CM | POA: Diagnosis not present

## 2017-02-26 DIAGNOSIS — E559 Vitamin D deficiency, unspecified: Secondary | ICD-10-CM | POA: Diagnosis not present

## 2017-02-26 DIAGNOSIS — D649 Anemia, unspecified: Secondary | ICD-10-CM | POA: Diagnosis not present

## 2017-02-27 DIAGNOSIS — Z79899 Other long term (current) drug therapy: Secondary | ICD-10-CM | POA: Diagnosis not present

## 2017-02-27 DIAGNOSIS — L97909 Non-pressure chronic ulcer of unspecified part of unspecified lower leg with unspecified severity: Secondary | ICD-10-CM | POA: Diagnosis not present

## 2017-02-27 DIAGNOSIS — L97329 Non-pressure chronic ulcer of left ankle with unspecified severity: Secondary | ICD-10-CM | POA: Diagnosis not present

## 2017-02-27 DIAGNOSIS — M1990 Unspecified osteoarthritis, unspecified site: Secondary | ICD-10-CM | POA: Diagnosis not present

## 2017-02-27 DIAGNOSIS — M869 Osteomyelitis, unspecified: Secondary | ICD-10-CM | POA: Diagnosis not present

## 2017-02-27 DIAGNOSIS — M8668 Other chronic osteomyelitis, other site: Secondary | ICD-10-CM | POA: Diagnosis not present

## 2017-02-27 DIAGNOSIS — I87309 Chronic venous hypertension (idiopathic) without complications of unspecified lower extremity: Secondary | ICD-10-CM | POA: Diagnosis not present

## 2017-02-27 DIAGNOSIS — I87313 Chronic venous hypertension (idiopathic) with ulcer of bilateral lower extremity: Secondary | ICD-10-CM | POA: Diagnosis not present

## 2017-02-27 DIAGNOSIS — I83892 Varicose veins of left lower extremities with other complications: Secondary | ICD-10-CM | POA: Diagnosis not present

## 2017-02-27 DIAGNOSIS — I10 Essential (primary) hypertension: Secondary | ICD-10-CM | POA: Diagnosis not present

## 2017-02-27 DIAGNOSIS — M868X8 Other osteomyelitis, other site: Secondary | ICD-10-CM | POA: Diagnosis not present

## 2017-02-27 DIAGNOSIS — Z888 Allergy status to other drugs, medicaments and biological substances status: Secondary | ICD-10-CM | POA: Diagnosis not present

## 2017-02-27 DIAGNOSIS — Z87891 Personal history of nicotine dependence: Secondary | ICD-10-CM | POA: Diagnosis not present

## 2017-02-27 DIAGNOSIS — Z880 Allergy status to penicillin: Secondary | ICD-10-CM | POA: Diagnosis not present

## 2017-02-27 DIAGNOSIS — S90512D Abrasion, left ankle, subsequent encounter: Secondary | ICD-10-CM | POA: Diagnosis not present

## 2017-02-27 DIAGNOSIS — S90812D Abrasion, left foot, subsequent encounter: Secondary | ICD-10-CM | POA: Diagnosis not present

## 2017-02-27 DIAGNOSIS — L97219 Non-pressure chronic ulcer of right calf with unspecified severity: Secondary | ICD-10-CM | POA: Diagnosis not present

## 2017-02-27 DIAGNOSIS — I872 Venous insufficiency (chronic) (peripheral): Secondary | ICD-10-CM | POA: Diagnosis not present

## 2017-02-27 DIAGNOSIS — M86661 Other chronic osteomyelitis, right tibia and fibula: Secondary | ICD-10-CM | POA: Diagnosis not present

## 2017-02-27 DIAGNOSIS — Z886 Allergy status to analgesic agent status: Secondary | ICD-10-CM | POA: Diagnosis not present

## 2017-02-27 DIAGNOSIS — L97812 Non-pressure chronic ulcer of other part of right lower leg with fat layer exposed: Secondary | ICD-10-CM | POA: Diagnosis not present

## 2017-02-28 DIAGNOSIS — S90812D Abrasion, left foot, subsequent encounter: Secondary | ICD-10-CM | POA: Diagnosis not present

## 2017-02-28 DIAGNOSIS — S90512D Abrasion, left ankle, subsequent encounter: Secondary | ICD-10-CM | POA: Diagnosis not present

## 2017-02-28 DIAGNOSIS — L97909 Non-pressure chronic ulcer of unspecified part of unspecified lower leg with unspecified severity: Secondary | ICD-10-CM | POA: Diagnosis not present

## 2017-02-28 DIAGNOSIS — M8668 Other chronic osteomyelitis, other site: Secondary | ICD-10-CM | POA: Diagnosis not present

## 2017-02-28 DIAGNOSIS — M869 Osteomyelitis, unspecified: Secondary | ICD-10-CM | POA: Diagnosis not present

## 2017-02-28 DIAGNOSIS — I10 Essential (primary) hypertension: Secondary | ICD-10-CM | POA: Diagnosis not present

## 2017-02-28 DIAGNOSIS — L97812 Non-pressure chronic ulcer of other part of right lower leg with fat layer exposed: Secondary | ICD-10-CM | POA: Diagnosis not present

## 2017-02-28 DIAGNOSIS — M1990 Unspecified osteoarthritis, unspecified site: Secondary | ICD-10-CM | POA: Diagnosis not present

## 2017-02-28 DIAGNOSIS — M86661 Other chronic osteomyelitis, right tibia and fibula: Secondary | ICD-10-CM | POA: Diagnosis not present

## 2017-03-01 DIAGNOSIS — M869 Osteomyelitis, unspecified: Secondary | ICD-10-CM | POA: Diagnosis not present

## 2017-03-01 DIAGNOSIS — S90812D Abrasion, left foot, subsequent encounter: Secondary | ICD-10-CM | POA: Diagnosis not present

## 2017-03-01 DIAGNOSIS — L97812 Non-pressure chronic ulcer of other part of right lower leg with fat layer exposed: Secondary | ICD-10-CM | POA: Diagnosis not present

## 2017-03-01 DIAGNOSIS — S90512D Abrasion, left ankle, subsequent encounter: Secondary | ICD-10-CM | POA: Diagnosis not present

## 2017-03-01 DIAGNOSIS — M8668 Other chronic osteomyelitis, other site: Secondary | ICD-10-CM | POA: Diagnosis not present

## 2017-03-01 DIAGNOSIS — L97909 Non-pressure chronic ulcer of unspecified part of unspecified lower leg with unspecified severity: Secondary | ICD-10-CM | POA: Diagnosis not present

## 2017-03-01 DIAGNOSIS — M86661 Other chronic osteomyelitis, right tibia and fibula: Secondary | ICD-10-CM | POA: Diagnosis not present

## 2017-03-01 DIAGNOSIS — M1990 Unspecified osteoarthritis, unspecified site: Secondary | ICD-10-CM | POA: Diagnosis not present

## 2017-03-01 DIAGNOSIS — I10 Essential (primary) hypertension: Secondary | ICD-10-CM | POA: Diagnosis not present

## 2017-03-02 DIAGNOSIS — I872 Venous insufficiency (chronic) (peripheral): Secondary | ICD-10-CM | POA: Diagnosis not present

## 2017-03-02 DIAGNOSIS — M1990 Unspecified osteoarthritis, unspecified site: Secondary | ICD-10-CM | POA: Diagnosis not present

## 2017-03-02 DIAGNOSIS — L97909 Non-pressure chronic ulcer of unspecified part of unspecified lower leg with unspecified severity: Secondary | ICD-10-CM | POA: Diagnosis not present

## 2017-03-02 DIAGNOSIS — M8668 Other chronic osteomyelitis, other site: Secondary | ICD-10-CM | POA: Diagnosis not present

## 2017-03-02 DIAGNOSIS — I10 Essential (primary) hypertension: Secondary | ICD-10-CM | POA: Diagnosis not present

## 2017-03-02 DIAGNOSIS — Z48812 Encounter for surgical aftercare following surgery on the circulatory system: Secondary | ICD-10-CM | POA: Diagnosis not present

## 2017-03-02 DIAGNOSIS — M869 Osteomyelitis, unspecified: Secondary | ICD-10-CM | POA: Diagnosis not present

## 2017-03-02 DIAGNOSIS — S90812D Abrasion, left foot, subsequent encounter: Secondary | ICD-10-CM | POA: Diagnosis not present

## 2017-03-02 DIAGNOSIS — M86661 Other chronic osteomyelitis, right tibia and fibula: Secondary | ICD-10-CM | POA: Diagnosis not present

## 2017-03-02 DIAGNOSIS — L97812 Non-pressure chronic ulcer of other part of right lower leg with fat layer exposed: Secondary | ICD-10-CM | POA: Diagnosis not present

## 2017-03-02 DIAGNOSIS — S90512D Abrasion, left ankle, subsequent encounter: Secondary | ICD-10-CM | POA: Diagnosis not present

## 2017-03-03 DIAGNOSIS — M150 Primary generalized (osteo)arthritis: Secondary | ICD-10-CM | POA: Diagnosis not present

## 2017-03-03 DIAGNOSIS — M869 Osteomyelitis, unspecified: Secondary | ICD-10-CM | POA: Diagnosis not present

## 2017-03-03 DIAGNOSIS — L97829 Non-pressure chronic ulcer of other part of left lower leg with unspecified severity: Secondary | ICD-10-CM | POA: Diagnosis not present

## 2017-03-03 DIAGNOSIS — L97813 Non-pressure chronic ulcer of other part of right lower leg with necrosis of muscle: Secondary | ICD-10-CM | POA: Diagnosis not present

## 2017-03-03 DIAGNOSIS — M86271 Subacute osteomyelitis, right ankle and foot: Secondary | ICD-10-CM | POA: Diagnosis not present

## 2017-03-03 DIAGNOSIS — L97909 Non-pressure chronic ulcer of unspecified part of unspecified lower leg with unspecified severity: Secondary | ICD-10-CM | POA: Diagnosis not present

## 2017-03-03 DIAGNOSIS — L97322 Non-pressure chronic ulcer of left ankle with fat layer exposed: Secondary | ICD-10-CM | POA: Diagnosis not present

## 2017-03-03 DIAGNOSIS — L97521 Non-pressure chronic ulcer of other part of left foot limited to breakdown of skin: Secondary | ICD-10-CM | POA: Diagnosis not present

## 2017-03-04 DIAGNOSIS — M869 Osteomyelitis, unspecified: Secondary | ICD-10-CM | POA: Diagnosis not present

## 2017-03-04 DIAGNOSIS — L97909 Non-pressure chronic ulcer of unspecified part of unspecified lower leg with unspecified severity: Secondary | ICD-10-CM | POA: Diagnosis not present

## 2017-03-05 DIAGNOSIS — Z880 Allergy status to penicillin: Secondary | ICD-10-CM | POA: Diagnosis not present

## 2017-03-05 DIAGNOSIS — L97312 Non-pressure chronic ulcer of right ankle with fat layer exposed: Secondary | ICD-10-CM | POA: Diagnosis not present

## 2017-03-05 DIAGNOSIS — E7800 Pure hypercholesterolemia, unspecified: Secondary | ICD-10-CM | POA: Diagnosis not present

## 2017-03-05 DIAGNOSIS — Z87891 Personal history of nicotine dependence: Secondary | ICD-10-CM | POA: Diagnosis not present

## 2017-03-05 DIAGNOSIS — G629 Polyneuropathy, unspecified: Secondary | ICD-10-CM | POA: Diagnosis not present

## 2017-03-05 DIAGNOSIS — L97812 Non-pressure chronic ulcer of other part of right lower leg with fat layer exposed: Secondary | ICD-10-CM | POA: Diagnosis not present

## 2017-03-05 DIAGNOSIS — M1990 Unspecified osteoarthritis, unspecified site: Secondary | ICD-10-CM | POA: Diagnosis not present

## 2017-03-05 DIAGNOSIS — I872 Venous insufficiency (chronic) (peripheral): Secondary | ICD-10-CM | POA: Diagnosis not present

## 2017-03-05 DIAGNOSIS — L97322 Non-pressure chronic ulcer of left ankle with fat layer exposed: Secondary | ICD-10-CM | POA: Diagnosis not present

## 2017-03-05 DIAGNOSIS — E559 Vitamin D deficiency, unspecified: Secondary | ICD-10-CM | POA: Diagnosis not present

## 2017-03-05 DIAGNOSIS — L97909 Non-pressure chronic ulcer of unspecified part of unspecified lower leg with unspecified severity: Secondary | ICD-10-CM | POA: Diagnosis not present

## 2017-03-05 DIAGNOSIS — M8668 Other chronic osteomyelitis, other site: Secondary | ICD-10-CM | POA: Diagnosis not present

## 2017-03-05 DIAGNOSIS — M869 Osteomyelitis, unspecified: Secondary | ICD-10-CM | POA: Diagnosis not present

## 2017-03-05 DIAGNOSIS — D649 Anemia, unspecified: Secondary | ICD-10-CM | POA: Diagnosis not present

## 2017-03-05 DIAGNOSIS — L97522 Non-pressure chronic ulcer of other part of left foot with fat layer exposed: Secondary | ICD-10-CM | POA: Diagnosis not present

## 2017-03-05 DIAGNOSIS — I10 Essential (primary) hypertension: Secondary | ICD-10-CM | POA: Diagnosis not present

## 2017-03-05 DIAGNOSIS — M2550 Pain in unspecified joint: Secondary | ICD-10-CM | POA: Diagnosis not present

## 2017-03-05 DIAGNOSIS — I2510 Atherosclerotic heart disease of native coronary artery without angina pectoris: Secondary | ICD-10-CM | POA: Diagnosis not present

## 2017-03-05 DIAGNOSIS — Z79899 Other long term (current) drug therapy: Secondary | ICD-10-CM | POA: Diagnosis not present

## 2017-03-05 DIAGNOSIS — M86661 Other chronic osteomyelitis, right tibia and fibula: Secondary | ICD-10-CM | POA: Diagnosis not present

## 2017-03-05 DIAGNOSIS — Z886 Allergy status to analgesic agent status: Secondary | ICD-10-CM | POA: Diagnosis not present

## 2017-03-06 DIAGNOSIS — L97813 Non-pressure chronic ulcer of other part of right lower leg with necrosis of muscle: Secondary | ICD-10-CM | POA: Diagnosis not present

## 2017-03-06 DIAGNOSIS — M86271 Subacute osteomyelitis, right ankle and foot: Secondary | ICD-10-CM | POA: Diagnosis not present

## 2017-03-06 DIAGNOSIS — L97829 Non-pressure chronic ulcer of other part of left lower leg with unspecified severity: Secondary | ICD-10-CM | POA: Diagnosis not present

## 2017-03-06 DIAGNOSIS — M150 Primary generalized (osteo)arthritis: Secondary | ICD-10-CM | POA: Diagnosis not present

## 2017-03-06 DIAGNOSIS — L97521 Non-pressure chronic ulcer of other part of left foot limited to breakdown of skin: Secondary | ICD-10-CM | POA: Diagnosis not present

## 2017-03-06 DIAGNOSIS — L97322 Non-pressure chronic ulcer of left ankle with fat layer exposed: Secondary | ICD-10-CM | POA: Diagnosis not present

## 2017-03-06 DIAGNOSIS — L97812 Non-pressure chronic ulcer of other part of right lower leg with fat layer exposed: Secondary | ICD-10-CM | POA: Diagnosis not present

## 2017-03-06 DIAGNOSIS — L97522 Non-pressure chronic ulcer of other part of left foot with fat layer exposed: Secondary | ICD-10-CM | POA: Diagnosis not present

## 2017-03-06 DIAGNOSIS — M8668 Other chronic osteomyelitis, other site: Secondary | ICD-10-CM | POA: Diagnosis not present

## 2017-03-06 DIAGNOSIS — M170 Bilateral primary osteoarthritis of knee: Secondary | ICD-10-CM | POA: Diagnosis not present

## 2017-03-06 DIAGNOSIS — E559 Vitamin D deficiency, unspecified: Secondary | ICD-10-CM | POA: Diagnosis not present

## 2017-03-06 DIAGNOSIS — M86661 Other chronic osteomyelitis, right tibia and fibula: Secondary | ICD-10-CM | POA: Diagnosis not present

## 2017-03-06 DIAGNOSIS — I10 Essential (primary) hypertension: Secondary | ICD-10-CM | POA: Diagnosis not present

## 2017-03-06 DIAGNOSIS — M869 Osteomyelitis, unspecified: Secondary | ICD-10-CM | POA: Diagnosis not present

## 2017-03-06 DIAGNOSIS — L97909 Non-pressure chronic ulcer of unspecified part of unspecified lower leg with unspecified severity: Secondary | ICD-10-CM | POA: Diagnosis not present

## 2017-03-07 DIAGNOSIS — M869 Osteomyelitis, unspecified: Secondary | ICD-10-CM | POA: Diagnosis not present

## 2017-03-07 DIAGNOSIS — L97909 Non-pressure chronic ulcer of unspecified part of unspecified lower leg with unspecified severity: Secondary | ICD-10-CM | POA: Diagnosis not present

## 2017-03-07 DIAGNOSIS — M86361 Chronic multifocal osteomyelitis, right tibia and fibula: Secondary | ICD-10-CM | POA: Diagnosis not present

## 2017-03-08 DIAGNOSIS — L97829 Non-pressure chronic ulcer of other part of left lower leg with unspecified severity: Secondary | ICD-10-CM | POA: Diagnosis not present

## 2017-03-08 DIAGNOSIS — M869 Osteomyelitis, unspecified: Secondary | ICD-10-CM | POA: Diagnosis not present

## 2017-03-08 DIAGNOSIS — L97909 Non-pressure chronic ulcer of unspecified part of unspecified lower leg with unspecified severity: Secondary | ICD-10-CM | POA: Diagnosis not present

## 2017-03-08 DIAGNOSIS — L97322 Non-pressure chronic ulcer of left ankle with fat layer exposed: Secondary | ICD-10-CM | POA: Diagnosis not present

## 2017-03-08 DIAGNOSIS — L97521 Non-pressure chronic ulcer of other part of left foot limited to breakdown of skin: Secondary | ICD-10-CM | POA: Diagnosis not present

## 2017-03-08 DIAGNOSIS — L97813 Non-pressure chronic ulcer of other part of right lower leg with necrosis of muscle: Secondary | ICD-10-CM | POA: Diagnosis not present

## 2017-03-08 DIAGNOSIS — M86271 Subacute osteomyelitis, right ankle and foot: Secondary | ICD-10-CM | POA: Diagnosis not present

## 2017-03-08 DIAGNOSIS — M150 Primary generalized (osteo)arthritis: Secondary | ICD-10-CM | POA: Diagnosis not present

## 2017-03-09 DIAGNOSIS — M869 Osteomyelitis, unspecified: Secondary | ICD-10-CM | POA: Diagnosis not present

## 2017-03-09 DIAGNOSIS — L97909 Non-pressure chronic ulcer of unspecified part of unspecified lower leg with unspecified severity: Secondary | ICD-10-CM | POA: Diagnosis not present

## 2017-03-10 DIAGNOSIS — L97909 Non-pressure chronic ulcer of unspecified part of unspecified lower leg with unspecified severity: Secondary | ICD-10-CM | POA: Diagnosis not present

## 2017-03-10 DIAGNOSIS — M869 Osteomyelitis, unspecified: Secondary | ICD-10-CM | POA: Diagnosis not present

## 2017-03-11 DIAGNOSIS — L97909 Non-pressure chronic ulcer of unspecified part of unspecified lower leg with unspecified severity: Secondary | ICD-10-CM | POA: Diagnosis not present

## 2017-03-11 DIAGNOSIS — M869 Osteomyelitis, unspecified: Secondary | ICD-10-CM | POA: Diagnosis not present

## 2017-03-12 DIAGNOSIS — I2510 Atherosclerotic heart disease of native coronary artery without angina pectoris: Secondary | ICD-10-CM | POA: Diagnosis not present

## 2017-03-12 DIAGNOSIS — D649 Anemia, unspecified: Secondary | ICD-10-CM | POA: Diagnosis not present

## 2017-03-12 DIAGNOSIS — L97212 Non-pressure chronic ulcer of right calf with fat layer exposed: Secondary | ICD-10-CM | POA: Diagnosis not present

## 2017-03-12 DIAGNOSIS — E7800 Pure hypercholesterolemia, unspecified: Secondary | ICD-10-CM | POA: Diagnosis not present

## 2017-03-12 DIAGNOSIS — L97909 Non-pressure chronic ulcer of unspecified part of unspecified lower leg with unspecified severity: Secondary | ICD-10-CM | POA: Diagnosis not present

## 2017-03-12 DIAGNOSIS — I10 Essential (primary) hypertension: Secondary | ICD-10-CM | POA: Diagnosis not present

## 2017-03-12 DIAGNOSIS — I872 Venous insufficiency (chronic) (peripheral): Secondary | ICD-10-CM | POA: Diagnosis not present

## 2017-03-12 DIAGNOSIS — L97522 Non-pressure chronic ulcer of other part of left foot with fat layer exposed: Secondary | ICD-10-CM | POA: Diagnosis not present

## 2017-03-12 DIAGNOSIS — M1990 Unspecified osteoarthritis, unspecified site: Secondary | ICD-10-CM | POA: Diagnosis not present

## 2017-03-12 DIAGNOSIS — M869 Osteomyelitis, unspecified: Secondary | ICD-10-CM | POA: Diagnosis not present

## 2017-03-12 DIAGNOSIS — G629 Polyneuropathy, unspecified: Secondary | ICD-10-CM | POA: Diagnosis not present

## 2017-03-12 DIAGNOSIS — M8668 Other chronic osteomyelitis, other site: Secondary | ICD-10-CM | POA: Diagnosis not present

## 2017-03-12 DIAGNOSIS — Z87891 Personal history of nicotine dependence: Secondary | ICD-10-CM | POA: Diagnosis not present

## 2017-03-12 DIAGNOSIS — M2550 Pain in unspecified joint: Secondary | ICD-10-CM | POA: Diagnosis not present

## 2017-03-12 DIAGNOSIS — Z880 Allergy status to penicillin: Secondary | ICD-10-CM | POA: Diagnosis not present

## 2017-03-12 DIAGNOSIS — L97812 Non-pressure chronic ulcer of other part of right lower leg with fat layer exposed: Secondary | ICD-10-CM | POA: Diagnosis not present

## 2017-03-12 DIAGNOSIS — L97322 Non-pressure chronic ulcer of left ankle with fat layer exposed: Secondary | ICD-10-CM | POA: Diagnosis not present

## 2017-03-12 DIAGNOSIS — Z79899 Other long term (current) drug therapy: Secondary | ICD-10-CM | POA: Diagnosis not present

## 2017-03-12 DIAGNOSIS — M86161 Other acute osteomyelitis, right tibia and fibula: Secondary | ICD-10-CM | POA: Diagnosis not present

## 2017-03-12 DIAGNOSIS — Z886 Allergy status to analgesic agent status: Secondary | ICD-10-CM | POA: Diagnosis not present

## 2017-03-13 DIAGNOSIS — M869 Osteomyelitis, unspecified: Secondary | ICD-10-CM | POA: Diagnosis not present

## 2017-03-13 DIAGNOSIS — L97909 Non-pressure chronic ulcer of unspecified part of unspecified lower leg with unspecified severity: Secondary | ICD-10-CM | POA: Diagnosis not present

## 2017-03-14 DIAGNOSIS — L97909 Non-pressure chronic ulcer of unspecified part of unspecified lower leg with unspecified severity: Secondary | ICD-10-CM | POA: Diagnosis not present

## 2017-03-14 DIAGNOSIS — M869 Osteomyelitis, unspecified: Secondary | ICD-10-CM | POA: Diagnosis not present

## 2017-03-15 DIAGNOSIS — L97813 Non-pressure chronic ulcer of other part of right lower leg with necrosis of muscle: Secondary | ICD-10-CM | POA: Diagnosis not present

## 2017-03-15 DIAGNOSIS — M86271 Subacute osteomyelitis, right ankle and foot: Secondary | ICD-10-CM | POA: Diagnosis not present

## 2017-03-15 DIAGNOSIS — M150 Primary generalized (osteo)arthritis: Secondary | ICD-10-CM | POA: Diagnosis not present

## 2017-03-15 DIAGNOSIS — L97909 Non-pressure chronic ulcer of unspecified part of unspecified lower leg with unspecified severity: Secondary | ICD-10-CM | POA: Diagnosis not present

## 2017-03-15 DIAGNOSIS — M869 Osteomyelitis, unspecified: Secondary | ICD-10-CM | POA: Diagnosis not present

## 2017-03-15 DIAGNOSIS — L97829 Non-pressure chronic ulcer of other part of left lower leg with unspecified severity: Secondary | ICD-10-CM | POA: Diagnosis not present

## 2017-03-15 DIAGNOSIS — L97521 Non-pressure chronic ulcer of other part of left foot limited to breakdown of skin: Secondary | ICD-10-CM | POA: Diagnosis not present

## 2017-03-15 DIAGNOSIS — L97322 Non-pressure chronic ulcer of left ankle with fat layer exposed: Secondary | ICD-10-CM | POA: Diagnosis not present

## 2017-03-16 DIAGNOSIS — L97909 Non-pressure chronic ulcer of unspecified part of unspecified lower leg with unspecified severity: Secondary | ICD-10-CM | POA: Diagnosis not present

## 2017-03-16 DIAGNOSIS — M869 Osteomyelitis, unspecified: Secondary | ICD-10-CM | POA: Diagnosis not present

## 2017-03-17 DIAGNOSIS — L97909 Non-pressure chronic ulcer of unspecified part of unspecified lower leg with unspecified severity: Secondary | ICD-10-CM | POA: Diagnosis not present

## 2017-03-17 DIAGNOSIS — M869 Osteomyelitis, unspecified: Secondary | ICD-10-CM | POA: Diagnosis not present

## 2017-03-18 DIAGNOSIS — L97909 Non-pressure chronic ulcer of unspecified part of unspecified lower leg with unspecified severity: Secondary | ICD-10-CM | POA: Diagnosis not present

## 2017-03-18 DIAGNOSIS — M869 Osteomyelitis, unspecified: Secondary | ICD-10-CM | POA: Diagnosis not present

## 2017-03-19 DIAGNOSIS — Z79899 Other long term (current) drug therapy: Secondary | ICD-10-CM | POA: Diagnosis not present

## 2017-03-19 DIAGNOSIS — M86661 Other chronic osteomyelitis, right tibia and fibula: Secondary | ICD-10-CM | POA: Diagnosis not present

## 2017-03-19 DIAGNOSIS — M2550 Pain in unspecified joint: Secondary | ICD-10-CM | POA: Diagnosis not present

## 2017-03-19 DIAGNOSIS — Z880 Allergy status to penicillin: Secondary | ICD-10-CM | POA: Diagnosis not present

## 2017-03-19 DIAGNOSIS — L97322 Non-pressure chronic ulcer of left ankle with fat layer exposed: Secondary | ICD-10-CM | POA: Diagnosis not present

## 2017-03-19 DIAGNOSIS — D649 Anemia, unspecified: Secondary | ICD-10-CM | POA: Diagnosis not present

## 2017-03-19 DIAGNOSIS — Z87891 Personal history of nicotine dependence: Secondary | ICD-10-CM | POA: Diagnosis not present

## 2017-03-19 DIAGNOSIS — I2510 Atherosclerotic heart disease of native coronary artery without angina pectoris: Secondary | ICD-10-CM | POA: Diagnosis not present

## 2017-03-19 DIAGNOSIS — Z886 Allergy status to analgesic agent status: Secondary | ICD-10-CM | POA: Diagnosis not present

## 2017-03-19 DIAGNOSIS — M869 Osteomyelitis, unspecified: Secondary | ICD-10-CM | POA: Diagnosis not present

## 2017-03-19 DIAGNOSIS — L97812 Non-pressure chronic ulcer of other part of right lower leg with fat layer exposed: Secondary | ICD-10-CM | POA: Diagnosis not present

## 2017-03-19 DIAGNOSIS — E7800 Pure hypercholesterolemia, unspecified: Secondary | ICD-10-CM | POA: Diagnosis not present

## 2017-03-19 DIAGNOSIS — I872 Venous insufficiency (chronic) (peripheral): Secondary | ICD-10-CM | POA: Diagnosis not present

## 2017-03-19 DIAGNOSIS — L97212 Non-pressure chronic ulcer of right calf with fat layer exposed: Secondary | ICD-10-CM | POA: Diagnosis not present

## 2017-03-19 DIAGNOSIS — I10 Essential (primary) hypertension: Secondary | ICD-10-CM | POA: Diagnosis not present

## 2017-03-19 DIAGNOSIS — G629 Polyneuropathy, unspecified: Secondary | ICD-10-CM | POA: Diagnosis not present

## 2017-03-19 DIAGNOSIS — M1990 Unspecified osteoarthritis, unspecified site: Secondary | ICD-10-CM | POA: Diagnosis not present

## 2017-03-19 DIAGNOSIS — L97909 Non-pressure chronic ulcer of unspecified part of unspecified lower leg with unspecified severity: Secondary | ICD-10-CM | POA: Diagnosis not present

## 2017-03-19 DIAGNOSIS — L97522 Non-pressure chronic ulcer of other part of left foot with fat layer exposed: Secondary | ICD-10-CM | POA: Diagnosis not present

## 2017-03-20 DIAGNOSIS — M869 Osteomyelitis, unspecified: Secondary | ICD-10-CM | POA: Diagnosis not present

## 2017-03-20 DIAGNOSIS — M86661 Other chronic osteomyelitis, right tibia and fibula: Secondary | ICD-10-CM | POA: Diagnosis not present

## 2017-03-20 DIAGNOSIS — L97909 Non-pressure chronic ulcer of unspecified part of unspecified lower leg with unspecified severity: Secondary | ICD-10-CM | POA: Diagnosis not present

## 2017-03-20 DIAGNOSIS — L97322 Non-pressure chronic ulcer of left ankle with fat layer exposed: Secondary | ICD-10-CM | POA: Diagnosis not present

## 2017-03-20 DIAGNOSIS — M1990 Unspecified osteoarthritis, unspecified site: Secondary | ICD-10-CM | POA: Diagnosis not present

## 2017-03-20 DIAGNOSIS — M8668 Other chronic osteomyelitis, other site: Secondary | ICD-10-CM | POA: Diagnosis not present

## 2017-03-20 DIAGNOSIS — I10 Essential (primary) hypertension: Secondary | ICD-10-CM | POA: Diagnosis not present

## 2017-03-20 DIAGNOSIS — L97522 Non-pressure chronic ulcer of other part of left foot with fat layer exposed: Secondary | ICD-10-CM | POA: Diagnosis not present

## 2017-03-20 DIAGNOSIS — L97812 Non-pressure chronic ulcer of other part of right lower leg with fat layer exposed: Secondary | ICD-10-CM | POA: Diagnosis not present

## 2017-03-21 DIAGNOSIS — L97522 Non-pressure chronic ulcer of other part of left foot with fat layer exposed: Secondary | ICD-10-CM | POA: Diagnosis not present

## 2017-03-21 DIAGNOSIS — I10 Essential (primary) hypertension: Secondary | ICD-10-CM | POA: Diagnosis not present

## 2017-03-21 DIAGNOSIS — L97322 Non-pressure chronic ulcer of left ankle with fat layer exposed: Secondary | ICD-10-CM | POA: Diagnosis not present

## 2017-03-21 DIAGNOSIS — M8668 Other chronic osteomyelitis, other site: Secondary | ICD-10-CM | POA: Diagnosis not present

## 2017-03-21 DIAGNOSIS — L97812 Non-pressure chronic ulcer of other part of right lower leg with fat layer exposed: Secondary | ICD-10-CM | POA: Diagnosis not present

## 2017-03-21 DIAGNOSIS — M86661 Other chronic osteomyelitis, right tibia and fibula: Secondary | ICD-10-CM | POA: Diagnosis not present

## 2017-03-21 DIAGNOSIS — M869 Osteomyelitis, unspecified: Secondary | ICD-10-CM | POA: Diagnosis not present

## 2017-03-21 DIAGNOSIS — L97909 Non-pressure chronic ulcer of unspecified part of unspecified lower leg with unspecified severity: Secondary | ICD-10-CM | POA: Diagnosis not present

## 2017-03-21 DIAGNOSIS — M1990 Unspecified osteoarthritis, unspecified site: Secondary | ICD-10-CM | POA: Diagnosis not present

## 2017-03-22 DIAGNOSIS — L97813 Non-pressure chronic ulcer of other part of right lower leg with necrosis of muscle: Secondary | ICD-10-CM | POA: Diagnosis not present

## 2017-03-22 DIAGNOSIS — M1990 Unspecified osteoarthritis, unspecified site: Secondary | ICD-10-CM | POA: Diagnosis not present

## 2017-03-22 DIAGNOSIS — L97812 Non-pressure chronic ulcer of other part of right lower leg with fat layer exposed: Secondary | ICD-10-CM | POA: Diagnosis not present

## 2017-03-22 DIAGNOSIS — I10 Essential (primary) hypertension: Secondary | ICD-10-CM | POA: Diagnosis not present

## 2017-03-22 DIAGNOSIS — L97522 Non-pressure chronic ulcer of other part of left foot with fat layer exposed: Secondary | ICD-10-CM | POA: Diagnosis not present

## 2017-03-22 DIAGNOSIS — L97322 Non-pressure chronic ulcer of left ankle with fat layer exposed: Secondary | ICD-10-CM | POA: Diagnosis not present

## 2017-03-22 DIAGNOSIS — M86271 Subacute osteomyelitis, right ankle and foot: Secondary | ICD-10-CM | POA: Diagnosis not present

## 2017-03-22 DIAGNOSIS — L97909 Non-pressure chronic ulcer of unspecified part of unspecified lower leg with unspecified severity: Secondary | ICD-10-CM | POA: Diagnosis not present

## 2017-03-22 DIAGNOSIS — L97829 Non-pressure chronic ulcer of other part of left lower leg with unspecified severity: Secondary | ICD-10-CM | POA: Diagnosis not present

## 2017-03-22 DIAGNOSIS — M150 Primary generalized (osteo)arthritis: Secondary | ICD-10-CM | POA: Diagnosis not present

## 2017-03-22 DIAGNOSIS — L97521 Non-pressure chronic ulcer of other part of left foot limited to breakdown of skin: Secondary | ICD-10-CM | POA: Diagnosis not present

## 2017-03-22 DIAGNOSIS — M869 Osteomyelitis, unspecified: Secondary | ICD-10-CM | POA: Diagnosis not present

## 2017-03-22 DIAGNOSIS — M86661 Other chronic osteomyelitis, right tibia and fibula: Secondary | ICD-10-CM | POA: Diagnosis not present

## 2017-03-22 DIAGNOSIS — M8668 Other chronic osteomyelitis, other site: Secondary | ICD-10-CM | POA: Diagnosis not present

## 2017-03-23 DIAGNOSIS — L97909 Non-pressure chronic ulcer of unspecified part of unspecified lower leg with unspecified severity: Secondary | ICD-10-CM | POA: Diagnosis not present

## 2017-03-23 DIAGNOSIS — M86661 Other chronic osteomyelitis, right tibia and fibula: Secondary | ICD-10-CM | POA: Diagnosis not present

## 2017-03-23 DIAGNOSIS — Z886 Allergy status to analgesic agent status: Secondary | ICD-10-CM | POA: Diagnosis not present

## 2017-03-23 DIAGNOSIS — M869 Osteomyelitis, unspecified: Secondary | ICD-10-CM | POA: Diagnosis not present

## 2017-03-23 DIAGNOSIS — Z880 Allergy status to penicillin: Secondary | ICD-10-CM | POA: Diagnosis not present

## 2017-03-24 DIAGNOSIS — M869 Osteomyelitis, unspecified: Secondary | ICD-10-CM | POA: Diagnosis not present

## 2017-03-24 DIAGNOSIS — L97909 Non-pressure chronic ulcer of unspecified part of unspecified lower leg with unspecified severity: Secondary | ICD-10-CM | POA: Diagnosis not present

## 2017-03-25 DIAGNOSIS — M869 Osteomyelitis, unspecified: Secondary | ICD-10-CM | POA: Diagnosis not present

## 2017-03-25 DIAGNOSIS — L97909 Non-pressure chronic ulcer of unspecified part of unspecified lower leg with unspecified severity: Secondary | ICD-10-CM | POA: Diagnosis not present

## 2017-03-26 DIAGNOSIS — L97812 Non-pressure chronic ulcer of other part of right lower leg with fat layer exposed: Secondary | ICD-10-CM | POA: Diagnosis not present

## 2017-03-26 DIAGNOSIS — E7800 Pure hypercholesterolemia, unspecified: Secondary | ICD-10-CM | POA: Diagnosis not present

## 2017-03-26 DIAGNOSIS — Z87891 Personal history of nicotine dependence: Secondary | ICD-10-CM | POA: Diagnosis not present

## 2017-03-26 DIAGNOSIS — I2510 Atherosclerotic heart disease of native coronary artery without angina pectoris: Secondary | ICD-10-CM | POA: Diagnosis not present

## 2017-03-26 DIAGNOSIS — L97322 Non-pressure chronic ulcer of left ankle with fat layer exposed: Secondary | ICD-10-CM | POA: Diagnosis not present

## 2017-03-26 DIAGNOSIS — Z79899 Other long term (current) drug therapy: Secondary | ICD-10-CM | POA: Diagnosis not present

## 2017-03-26 DIAGNOSIS — Z886 Allergy status to analgesic agent status: Secondary | ICD-10-CM | POA: Diagnosis not present

## 2017-03-26 DIAGNOSIS — I872 Venous insufficiency (chronic) (peripheral): Secondary | ICD-10-CM | POA: Diagnosis not present

## 2017-03-26 DIAGNOSIS — G629 Polyneuropathy, unspecified: Secondary | ICD-10-CM | POA: Diagnosis not present

## 2017-03-26 DIAGNOSIS — M2550 Pain in unspecified joint: Secondary | ICD-10-CM | POA: Diagnosis not present

## 2017-03-26 DIAGNOSIS — Z880 Allergy status to penicillin: Secondary | ICD-10-CM | POA: Diagnosis not present

## 2017-03-26 DIAGNOSIS — D649 Anemia, unspecified: Secondary | ICD-10-CM | POA: Diagnosis not present

## 2017-03-26 DIAGNOSIS — M86661 Other chronic osteomyelitis, right tibia and fibula: Secondary | ICD-10-CM | POA: Diagnosis not present

## 2017-03-26 DIAGNOSIS — I10 Essential (primary) hypertension: Secondary | ICD-10-CM | POA: Diagnosis not present

## 2017-03-26 DIAGNOSIS — L97909 Non-pressure chronic ulcer of unspecified part of unspecified lower leg with unspecified severity: Secondary | ICD-10-CM | POA: Diagnosis not present

## 2017-03-26 DIAGNOSIS — L97212 Non-pressure chronic ulcer of right calf with fat layer exposed: Secondary | ICD-10-CM | POA: Diagnosis not present

## 2017-03-26 DIAGNOSIS — M869 Osteomyelitis, unspecified: Secondary | ICD-10-CM | POA: Diagnosis not present

## 2017-03-26 DIAGNOSIS — L97522 Non-pressure chronic ulcer of other part of left foot with fat layer exposed: Secondary | ICD-10-CM | POA: Diagnosis not present

## 2017-03-26 DIAGNOSIS — M1990 Unspecified osteoarthritis, unspecified site: Secondary | ICD-10-CM | POA: Diagnosis not present

## 2017-03-27 DIAGNOSIS — M86661 Other chronic osteomyelitis, right tibia and fibula: Secondary | ICD-10-CM | POA: Diagnosis not present

## 2017-03-27 DIAGNOSIS — L97522 Non-pressure chronic ulcer of other part of left foot with fat layer exposed: Secondary | ICD-10-CM | POA: Diagnosis not present

## 2017-03-27 DIAGNOSIS — M1990 Unspecified osteoarthritis, unspecified site: Secondary | ICD-10-CM | POA: Diagnosis not present

## 2017-03-27 DIAGNOSIS — L97812 Non-pressure chronic ulcer of other part of right lower leg with fat layer exposed: Secondary | ICD-10-CM | POA: Diagnosis not present

## 2017-03-27 DIAGNOSIS — M869 Osteomyelitis, unspecified: Secondary | ICD-10-CM | POA: Diagnosis not present

## 2017-03-27 DIAGNOSIS — L97322 Non-pressure chronic ulcer of left ankle with fat layer exposed: Secondary | ICD-10-CM | POA: Diagnosis not present

## 2017-03-27 DIAGNOSIS — L97909 Non-pressure chronic ulcer of unspecified part of unspecified lower leg with unspecified severity: Secondary | ICD-10-CM | POA: Diagnosis not present

## 2017-03-27 DIAGNOSIS — I10 Essential (primary) hypertension: Secondary | ICD-10-CM | POA: Diagnosis not present

## 2017-03-28 DIAGNOSIS — I10 Essential (primary) hypertension: Secondary | ICD-10-CM | POA: Diagnosis not present

## 2017-03-28 DIAGNOSIS — L97812 Non-pressure chronic ulcer of other part of right lower leg with fat layer exposed: Secondary | ICD-10-CM | POA: Diagnosis not present

## 2017-03-28 DIAGNOSIS — L97909 Non-pressure chronic ulcer of unspecified part of unspecified lower leg with unspecified severity: Secondary | ICD-10-CM | POA: Diagnosis not present

## 2017-03-28 DIAGNOSIS — L97322 Non-pressure chronic ulcer of left ankle with fat layer exposed: Secondary | ICD-10-CM | POA: Diagnosis not present

## 2017-03-28 DIAGNOSIS — M869 Osteomyelitis, unspecified: Secondary | ICD-10-CM | POA: Diagnosis not present

## 2017-03-28 DIAGNOSIS — L97522 Non-pressure chronic ulcer of other part of left foot with fat layer exposed: Secondary | ICD-10-CM | POA: Diagnosis not present

## 2017-03-28 DIAGNOSIS — M8668 Other chronic osteomyelitis, other site: Secondary | ICD-10-CM | POA: Diagnosis not present

## 2017-03-28 DIAGNOSIS — M1990 Unspecified osteoarthritis, unspecified site: Secondary | ICD-10-CM | POA: Diagnosis not present

## 2017-03-28 DIAGNOSIS — M86661 Other chronic osteomyelitis, right tibia and fibula: Secondary | ICD-10-CM | POA: Diagnosis not present

## 2017-03-29 DIAGNOSIS — L97813 Non-pressure chronic ulcer of other part of right lower leg with necrosis of muscle: Secondary | ICD-10-CM | POA: Diagnosis not present

## 2017-03-29 DIAGNOSIS — L97521 Non-pressure chronic ulcer of other part of left foot limited to breakdown of skin: Secondary | ICD-10-CM | POA: Diagnosis not present

## 2017-03-29 DIAGNOSIS — M150 Primary generalized (osteo)arthritis: Secondary | ICD-10-CM | POA: Diagnosis not present

## 2017-03-29 DIAGNOSIS — M86661 Other chronic osteomyelitis, right tibia and fibula: Secondary | ICD-10-CM | POA: Diagnosis not present

## 2017-03-29 DIAGNOSIS — L97829 Non-pressure chronic ulcer of other part of left lower leg with unspecified severity: Secondary | ICD-10-CM | POA: Diagnosis not present

## 2017-03-29 DIAGNOSIS — L97522 Non-pressure chronic ulcer of other part of left foot with fat layer exposed: Secondary | ICD-10-CM | POA: Diagnosis not present

## 2017-03-29 DIAGNOSIS — L97909 Non-pressure chronic ulcer of unspecified part of unspecified lower leg with unspecified severity: Secondary | ICD-10-CM | POA: Diagnosis not present

## 2017-03-29 DIAGNOSIS — M869 Osteomyelitis, unspecified: Secondary | ICD-10-CM | POA: Diagnosis not present

## 2017-03-29 DIAGNOSIS — I10 Essential (primary) hypertension: Secondary | ICD-10-CM | POA: Diagnosis not present

## 2017-03-29 DIAGNOSIS — M86271 Subacute osteomyelitis, right ankle and foot: Secondary | ICD-10-CM | POA: Diagnosis not present

## 2017-03-29 DIAGNOSIS — M8668 Other chronic osteomyelitis, other site: Secondary | ICD-10-CM | POA: Diagnosis not present

## 2017-03-29 DIAGNOSIS — L97322 Non-pressure chronic ulcer of left ankle with fat layer exposed: Secondary | ICD-10-CM | POA: Diagnosis not present

## 2017-03-29 DIAGNOSIS — M1990 Unspecified osteoarthritis, unspecified site: Secondary | ICD-10-CM | POA: Diagnosis not present

## 2017-03-29 DIAGNOSIS — L97812 Non-pressure chronic ulcer of other part of right lower leg with fat layer exposed: Secondary | ICD-10-CM | POA: Diagnosis not present

## 2017-03-30 DIAGNOSIS — I10 Essential (primary) hypertension: Secondary | ICD-10-CM | POA: Diagnosis not present

## 2017-03-30 DIAGNOSIS — M170 Bilateral primary osteoarthritis of knee: Secondary | ICD-10-CM | POA: Diagnosis not present

## 2017-03-31 DIAGNOSIS — L97909 Non-pressure chronic ulcer of unspecified part of unspecified lower leg with unspecified severity: Secondary | ICD-10-CM | POA: Diagnosis not present

## 2017-03-31 DIAGNOSIS — M869 Osteomyelitis, unspecified: Secondary | ICD-10-CM | POA: Diagnosis not present

## 2017-04-01 DIAGNOSIS — M869 Osteomyelitis, unspecified: Secondary | ICD-10-CM | POA: Diagnosis not present

## 2017-04-01 DIAGNOSIS — L97909 Non-pressure chronic ulcer of unspecified part of unspecified lower leg with unspecified severity: Secondary | ICD-10-CM | POA: Diagnosis not present

## 2017-04-02 DIAGNOSIS — L97812 Non-pressure chronic ulcer of other part of right lower leg with fat layer exposed: Secondary | ICD-10-CM | POA: Diagnosis not present

## 2017-04-02 DIAGNOSIS — G629 Polyneuropathy, unspecified: Secondary | ICD-10-CM | POA: Diagnosis not present

## 2017-04-02 DIAGNOSIS — M869 Osteomyelitis, unspecified: Secondary | ICD-10-CM | POA: Diagnosis not present

## 2017-04-02 DIAGNOSIS — I872 Venous insufficiency (chronic) (peripheral): Secondary | ICD-10-CM | POA: Diagnosis not present

## 2017-04-02 DIAGNOSIS — E7800 Pure hypercholesterolemia, unspecified: Secondary | ICD-10-CM | POA: Diagnosis not present

## 2017-04-02 DIAGNOSIS — D649 Anemia, unspecified: Secondary | ICD-10-CM | POA: Diagnosis not present

## 2017-04-02 DIAGNOSIS — M1990 Unspecified osteoarthritis, unspecified site: Secondary | ICD-10-CM | POA: Diagnosis not present

## 2017-04-02 DIAGNOSIS — Z1389 Encounter for screening for other disorder: Secondary | ICD-10-CM | POA: Diagnosis not present

## 2017-04-02 DIAGNOSIS — L97522 Non-pressure chronic ulcer of other part of left foot with fat layer exposed: Secondary | ICD-10-CM | POA: Diagnosis not present

## 2017-04-02 DIAGNOSIS — Z87891 Personal history of nicotine dependence: Secondary | ICD-10-CM | POA: Diagnosis not present

## 2017-04-02 DIAGNOSIS — Z880 Allergy status to penicillin: Secondary | ICD-10-CM | POA: Diagnosis not present

## 2017-04-02 DIAGNOSIS — M2550 Pain in unspecified joint: Secondary | ICD-10-CM | POA: Diagnosis not present

## 2017-04-02 DIAGNOSIS — G4709 Other insomnia: Secondary | ICD-10-CM | POA: Diagnosis not present

## 2017-04-02 DIAGNOSIS — M170 Bilateral primary osteoarthritis of knee: Secondary | ICD-10-CM | POA: Diagnosis not present

## 2017-04-02 DIAGNOSIS — Z0000 Encounter for general adult medical examination without abnormal findings: Secondary | ICD-10-CM | POA: Diagnosis not present

## 2017-04-02 DIAGNOSIS — M86661 Other chronic osteomyelitis, right tibia and fibula: Secondary | ICD-10-CM | POA: Diagnosis not present

## 2017-04-02 DIAGNOSIS — L97322 Non-pressure chronic ulcer of left ankle with fat layer exposed: Secondary | ICD-10-CM | POA: Diagnosis not present

## 2017-04-02 DIAGNOSIS — L97909 Non-pressure chronic ulcer of unspecified part of unspecified lower leg with unspecified severity: Secondary | ICD-10-CM | POA: Diagnosis not present

## 2017-04-02 DIAGNOSIS — I2510 Atherosclerotic heart disease of native coronary artery without angina pectoris: Secondary | ICD-10-CM | POA: Diagnosis not present

## 2017-04-02 DIAGNOSIS — L97212 Non-pressure chronic ulcer of right calf with fat layer exposed: Secondary | ICD-10-CM | POA: Diagnosis not present

## 2017-04-02 DIAGNOSIS — I10 Essential (primary) hypertension: Secondary | ICD-10-CM | POA: Diagnosis not present

## 2017-04-02 DIAGNOSIS — Z886 Allergy status to analgesic agent status: Secondary | ICD-10-CM | POA: Diagnosis not present

## 2017-04-02 DIAGNOSIS — Z79899 Other long term (current) drug therapy: Secondary | ICD-10-CM | POA: Diagnosis not present

## 2017-04-03 DIAGNOSIS — L97322 Non-pressure chronic ulcer of left ankle with fat layer exposed: Secondary | ICD-10-CM | POA: Diagnosis not present

## 2017-04-03 DIAGNOSIS — L97812 Non-pressure chronic ulcer of other part of right lower leg with fat layer exposed: Secondary | ICD-10-CM | POA: Diagnosis not present

## 2017-04-03 DIAGNOSIS — L97909 Non-pressure chronic ulcer of unspecified part of unspecified lower leg with unspecified severity: Secondary | ICD-10-CM | POA: Diagnosis not present

## 2017-04-03 DIAGNOSIS — M869 Osteomyelitis, unspecified: Secondary | ICD-10-CM | POA: Diagnosis not present

## 2017-04-03 DIAGNOSIS — I10 Essential (primary) hypertension: Secondary | ICD-10-CM | POA: Diagnosis not present

## 2017-04-03 DIAGNOSIS — M86661 Other chronic osteomyelitis, right tibia and fibula: Secondary | ICD-10-CM | POA: Diagnosis not present

## 2017-04-03 DIAGNOSIS — M1990 Unspecified osteoarthritis, unspecified site: Secondary | ICD-10-CM | POA: Diagnosis not present

## 2017-04-03 DIAGNOSIS — L97522 Non-pressure chronic ulcer of other part of left foot with fat layer exposed: Secondary | ICD-10-CM | POA: Diagnosis not present

## 2017-04-03 DIAGNOSIS — M8668 Other chronic osteomyelitis, other site: Secondary | ICD-10-CM | POA: Diagnosis not present

## 2017-04-04 DIAGNOSIS — L97522 Non-pressure chronic ulcer of other part of left foot with fat layer exposed: Secondary | ICD-10-CM | POA: Diagnosis not present

## 2017-04-04 DIAGNOSIS — L97812 Non-pressure chronic ulcer of other part of right lower leg with fat layer exposed: Secondary | ICD-10-CM | POA: Diagnosis not present

## 2017-04-04 DIAGNOSIS — L97909 Non-pressure chronic ulcer of unspecified part of unspecified lower leg with unspecified severity: Secondary | ICD-10-CM | POA: Diagnosis not present

## 2017-04-04 DIAGNOSIS — M869 Osteomyelitis, unspecified: Secondary | ICD-10-CM | POA: Diagnosis not present

## 2017-04-04 DIAGNOSIS — M86661 Other chronic osteomyelitis, right tibia and fibula: Secondary | ICD-10-CM | POA: Diagnosis not present

## 2017-04-04 DIAGNOSIS — M8668 Other chronic osteomyelitis, other site: Secondary | ICD-10-CM | POA: Diagnosis not present

## 2017-04-04 DIAGNOSIS — M1990 Unspecified osteoarthritis, unspecified site: Secondary | ICD-10-CM | POA: Diagnosis not present

## 2017-04-04 DIAGNOSIS — L97322 Non-pressure chronic ulcer of left ankle with fat layer exposed: Secondary | ICD-10-CM | POA: Diagnosis not present

## 2017-04-04 DIAGNOSIS — I10 Essential (primary) hypertension: Secondary | ICD-10-CM | POA: Diagnosis not present

## 2017-04-05 DIAGNOSIS — L97909 Non-pressure chronic ulcer of unspecified part of unspecified lower leg with unspecified severity: Secondary | ICD-10-CM | POA: Diagnosis not present

## 2017-04-05 DIAGNOSIS — M150 Primary generalized (osteo)arthritis: Secondary | ICD-10-CM | POA: Diagnosis not present

## 2017-04-05 DIAGNOSIS — L97813 Non-pressure chronic ulcer of other part of right lower leg with necrosis of muscle: Secondary | ICD-10-CM | POA: Diagnosis not present

## 2017-04-05 DIAGNOSIS — M8668 Other chronic osteomyelitis, other site: Secondary | ICD-10-CM | POA: Diagnosis not present

## 2017-04-05 DIAGNOSIS — M86271 Subacute osteomyelitis, right ankle and foot: Secondary | ICD-10-CM | POA: Diagnosis not present

## 2017-04-05 DIAGNOSIS — M86661 Other chronic osteomyelitis, right tibia and fibula: Secondary | ICD-10-CM | POA: Diagnosis not present

## 2017-04-05 DIAGNOSIS — M1990 Unspecified osteoarthritis, unspecified site: Secondary | ICD-10-CM | POA: Diagnosis not present

## 2017-04-05 DIAGNOSIS — L97829 Non-pressure chronic ulcer of other part of left lower leg with unspecified severity: Secondary | ICD-10-CM | POA: Diagnosis not present

## 2017-04-05 DIAGNOSIS — L97322 Non-pressure chronic ulcer of left ankle with fat layer exposed: Secondary | ICD-10-CM | POA: Diagnosis not present

## 2017-04-05 DIAGNOSIS — I10 Essential (primary) hypertension: Secondary | ICD-10-CM | POA: Diagnosis not present

## 2017-04-05 DIAGNOSIS — L97812 Non-pressure chronic ulcer of other part of right lower leg with fat layer exposed: Secondary | ICD-10-CM | POA: Diagnosis not present

## 2017-04-05 DIAGNOSIS — M869 Osteomyelitis, unspecified: Secondary | ICD-10-CM | POA: Diagnosis not present

## 2017-04-05 DIAGNOSIS — L97522 Non-pressure chronic ulcer of other part of left foot with fat layer exposed: Secondary | ICD-10-CM | POA: Diagnosis not present

## 2017-04-05 DIAGNOSIS — L97521 Non-pressure chronic ulcer of other part of left foot limited to breakdown of skin: Secondary | ICD-10-CM | POA: Diagnosis not present

## 2017-04-06 DIAGNOSIS — M1990 Unspecified osteoarthritis, unspecified site: Secondary | ICD-10-CM | POA: Diagnosis not present

## 2017-04-06 DIAGNOSIS — I10 Essential (primary) hypertension: Secondary | ICD-10-CM | POA: Diagnosis not present

## 2017-04-06 DIAGNOSIS — M8668 Other chronic osteomyelitis, other site: Secondary | ICD-10-CM | POA: Diagnosis not present

## 2017-04-06 DIAGNOSIS — L97812 Non-pressure chronic ulcer of other part of right lower leg with fat layer exposed: Secondary | ICD-10-CM | POA: Diagnosis not present

## 2017-04-06 DIAGNOSIS — L97322 Non-pressure chronic ulcer of left ankle with fat layer exposed: Secondary | ICD-10-CM | POA: Diagnosis not present

## 2017-04-06 DIAGNOSIS — L97909 Non-pressure chronic ulcer of unspecified part of unspecified lower leg with unspecified severity: Secondary | ICD-10-CM | POA: Diagnosis not present

## 2017-04-06 DIAGNOSIS — L97522 Non-pressure chronic ulcer of other part of left foot with fat layer exposed: Secondary | ICD-10-CM | POA: Diagnosis not present

## 2017-04-06 DIAGNOSIS — M869 Osteomyelitis, unspecified: Secondary | ICD-10-CM | POA: Diagnosis not present

## 2017-04-06 DIAGNOSIS — M86661 Other chronic osteomyelitis, right tibia and fibula: Secondary | ICD-10-CM | POA: Diagnosis not present

## 2017-04-07 DIAGNOSIS — L97909 Non-pressure chronic ulcer of unspecified part of unspecified lower leg with unspecified severity: Secondary | ICD-10-CM | POA: Diagnosis not present

## 2017-04-07 DIAGNOSIS — M869 Osteomyelitis, unspecified: Secondary | ICD-10-CM | POA: Diagnosis not present

## 2017-04-08 DIAGNOSIS — M869 Osteomyelitis, unspecified: Secondary | ICD-10-CM | POA: Diagnosis not present

## 2017-04-08 DIAGNOSIS — L97909 Non-pressure chronic ulcer of unspecified part of unspecified lower leg with unspecified severity: Secondary | ICD-10-CM | POA: Diagnosis not present

## 2017-04-09 DIAGNOSIS — M8668 Other chronic osteomyelitis, other site: Secondary | ICD-10-CM | POA: Diagnosis not present

## 2017-04-09 DIAGNOSIS — M869 Osteomyelitis, unspecified: Secondary | ICD-10-CM | POA: Diagnosis not present

## 2017-04-09 DIAGNOSIS — L97812 Non-pressure chronic ulcer of other part of right lower leg with fat layer exposed: Secondary | ICD-10-CM | POA: Diagnosis not present

## 2017-04-09 DIAGNOSIS — E7800 Pure hypercholesterolemia, unspecified: Secondary | ICD-10-CM | POA: Diagnosis not present

## 2017-04-09 DIAGNOSIS — Z79899 Other long term (current) drug therapy: Secondary | ICD-10-CM | POA: Diagnosis not present

## 2017-04-09 DIAGNOSIS — L97312 Non-pressure chronic ulcer of right ankle with fat layer exposed: Secondary | ICD-10-CM | POA: Diagnosis not present

## 2017-04-09 DIAGNOSIS — I2510 Atherosclerotic heart disease of native coronary artery without angina pectoris: Secondary | ICD-10-CM | POA: Diagnosis not present

## 2017-04-09 DIAGNOSIS — G629 Polyneuropathy, unspecified: Secondary | ICD-10-CM | POA: Diagnosis not present

## 2017-04-09 DIAGNOSIS — L97909 Non-pressure chronic ulcer of unspecified part of unspecified lower leg with unspecified severity: Secondary | ICD-10-CM | POA: Diagnosis not present

## 2017-04-09 DIAGNOSIS — L97522 Non-pressure chronic ulcer of other part of left foot with fat layer exposed: Secondary | ICD-10-CM | POA: Diagnosis not present

## 2017-04-09 DIAGNOSIS — L97322 Non-pressure chronic ulcer of left ankle with fat layer exposed: Secondary | ICD-10-CM | POA: Diagnosis not present

## 2017-04-09 DIAGNOSIS — M2550 Pain in unspecified joint: Secondary | ICD-10-CM | POA: Diagnosis not present

## 2017-04-09 DIAGNOSIS — Z87891 Personal history of nicotine dependence: Secondary | ICD-10-CM | POA: Diagnosis not present

## 2017-04-09 DIAGNOSIS — M1990 Unspecified osteoarthritis, unspecified site: Secondary | ICD-10-CM | POA: Diagnosis not present

## 2017-04-09 DIAGNOSIS — Z886 Allergy status to analgesic agent status: Secondary | ICD-10-CM | POA: Diagnosis not present

## 2017-04-09 DIAGNOSIS — D649 Anemia, unspecified: Secondary | ICD-10-CM | POA: Diagnosis not present

## 2017-04-09 DIAGNOSIS — Z91010 Allergy to peanuts: Secondary | ICD-10-CM | POA: Diagnosis not present

## 2017-04-09 DIAGNOSIS — I10 Essential (primary) hypertension: Secondary | ICD-10-CM | POA: Diagnosis not present

## 2017-04-09 DIAGNOSIS — S81811A Laceration without foreign body, right lower leg, initial encounter: Secondary | ICD-10-CM | POA: Diagnosis not present

## 2017-04-09 DIAGNOSIS — M86661 Other chronic osteomyelitis, right tibia and fibula: Secondary | ICD-10-CM | POA: Diagnosis not present

## 2017-04-09 DIAGNOSIS — I872 Venous insufficiency (chronic) (peripheral): Secondary | ICD-10-CM | POA: Diagnosis not present

## 2017-04-10 DIAGNOSIS — M869 Osteomyelitis, unspecified: Secondary | ICD-10-CM | POA: Diagnosis not present

## 2017-04-10 DIAGNOSIS — L97909 Non-pressure chronic ulcer of unspecified part of unspecified lower leg with unspecified severity: Secondary | ICD-10-CM | POA: Diagnosis not present

## 2017-04-10 DIAGNOSIS — I10 Essential (primary) hypertension: Secondary | ICD-10-CM | POA: Diagnosis not present

## 2017-04-10 DIAGNOSIS — L97812 Non-pressure chronic ulcer of other part of right lower leg with fat layer exposed: Secondary | ICD-10-CM | POA: Diagnosis not present

## 2017-04-10 DIAGNOSIS — L97322 Non-pressure chronic ulcer of left ankle with fat layer exposed: Secondary | ICD-10-CM | POA: Diagnosis not present

## 2017-04-10 DIAGNOSIS — M86661 Other chronic osteomyelitis, right tibia and fibula: Secondary | ICD-10-CM | POA: Diagnosis not present

## 2017-04-10 DIAGNOSIS — M8668 Other chronic osteomyelitis, other site: Secondary | ICD-10-CM | POA: Diagnosis not present

## 2017-04-10 DIAGNOSIS — S81811A Laceration without foreign body, right lower leg, initial encounter: Secondary | ICD-10-CM | POA: Diagnosis not present

## 2017-04-10 DIAGNOSIS — L97522 Non-pressure chronic ulcer of other part of left foot with fat layer exposed: Secondary | ICD-10-CM | POA: Diagnosis not present

## 2017-04-11 DIAGNOSIS — L97812 Non-pressure chronic ulcer of other part of right lower leg with fat layer exposed: Secondary | ICD-10-CM | POA: Diagnosis not present

## 2017-04-11 DIAGNOSIS — L97909 Non-pressure chronic ulcer of unspecified part of unspecified lower leg with unspecified severity: Secondary | ICD-10-CM | POA: Diagnosis not present

## 2017-04-11 DIAGNOSIS — L97322 Non-pressure chronic ulcer of left ankle with fat layer exposed: Secondary | ICD-10-CM | POA: Diagnosis not present

## 2017-04-11 DIAGNOSIS — S81811A Laceration without foreign body, right lower leg, initial encounter: Secondary | ICD-10-CM | POA: Diagnosis not present

## 2017-04-11 DIAGNOSIS — M8668 Other chronic osteomyelitis, other site: Secondary | ICD-10-CM | POA: Diagnosis not present

## 2017-04-11 DIAGNOSIS — L97522 Non-pressure chronic ulcer of other part of left foot with fat layer exposed: Secondary | ICD-10-CM | POA: Diagnosis not present

## 2017-04-11 DIAGNOSIS — M869 Osteomyelitis, unspecified: Secondary | ICD-10-CM | POA: Diagnosis not present

## 2017-04-11 DIAGNOSIS — M86661 Other chronic osteomyelitis, right tibia and fibula: Secondary | ICD-10-CM | POA: Diagnosis not present

## 2017-04-11 DIAGNOSIS — I10 Essential (primary) hypertension: Secondary | ICD-10-CM | POA: Diagnosis not present

## 2017-04-12 DIAGNOSIS — M8668 Other chronic osteomyelitis, other site: Secondary | ICD-10-CM | POA: Diagnosis not present

## 2017-04-12 DIAGNOSIS — L97521 Non-pressure chronic ulcer of other part of left foot limited to breakdown of skin: Secondary | ICD-10-CM | POA: Diagnosis not present

## 2017-04-12 DIAGNOSIS — M86661 Other chronic osteomyelitis, right tibia and fibula: Secondary | ICD-10-CM | POA: Diagnosis not present

## 2017-04-12 DIAGNOSIS — L97829 Non-pressure chronic ulcer of other part of left lower leg with unspecified severity: Secondary | ICD-10-CM | POA: Diagnosis not present

## 2017-04-12 DIAGNOSIS — M869 Osteomyelitis, unspecified: Secondary | ICD-10-CM | POA: Diagnosis not present

## 2017-04-12 DIAGNOSIS — L97522 Non-pressure chronic ulcer of other part of left foot with fat layer exposed: Secondary | ICD-10-CM | POA: Diagnosis not present

## 2017-04-12 DIAGNOSIS — L97813 Non-pressure chronic ulcer of other part of right lower leg with necrosis of muscle: Secondary | ICD-10-CM | POA: Diagnosis not present

## 2017-04-12 DIAGNOSIS — M86271 Subacute osteomyelitis, right ankle and foot: Secondary | ICD-10-CM | POA: Diagnosis not present

## 2017-04-12 DIAGNOSIS — I10 Essential (primary) hypertension: Secondary | ICD-10-CM | POA: Diagnosis not present

## 2017-04-12 DIAGNOSIS — M150 Primary generalized (osteo)arthritis: Secondary | ICD-10-CM | POA: Diagnosis not present

## 2017-04-12 DIAGNOSIS — L97909 Non-pressure chronic ulcer of unspecified part of unspecified lower leg with unspecified severity: Secondary | ICD-10-CM | POA: Diagnosis not present

## 2017-04-12 DIAGNOSIS — L97322 Non-pressure chronic ulcer of left ankle with fat layer exposed: Secondary | ICD-10-CM | POA: Diagnosis not present

## 2017-04-12 DIAGNOSIS — S81811A Laceration without foreign body, right lower leg, initial encounter: Secondary | ICD-10-CM | POA: Diagnosis not present

## 2017-04-12 DIAGNOSIS — L97812 Non-pressure chronic ulcer of other part of right lower leg with fat layer exposed: Secondary | ICD-10-CM | POA: Diagnosis not present

## 2017-04-13 DIAGNOSIS — M869 Osteomyelitis, unspecified: Secondary | ICD-10-CM | POA: Diagnosis not present

## 2017-04-13 DIAGNOSIS — M86661 Other chronic osteomyelitis, right tibia and fibula: Secondary | ICD-10-CM | POA: Diagnosis not present

## 2017-04-13 DIAGNOSIS — I10 Essential (primary) hypertension: Secondary | ICD-10-CM | POA: Diagnosis not present

## 2017-04-13 DIAGNOSIS — L97909 Non-pressure chronic ulcer of unspecified part of unspecified lower leg with unspecified severity: Secondary | ICD-10-CM | POA: Diagnosis not present

## 2017-04-13 DIAGNOSIS — L97322 Non-pressure chronic ulcer of left ankle with fat layer exposed: Secondary | ICD-10-CM | POA: Diagnosis not present

## 2017-04-13 DIAGNOSIS — M8668 Other chronic osteomyelitis, other site: Secondary | ICD-10-CM | POA: Diagnosis not present

## 2017-04-13 DIAGNOSIS — L97522 Non-pressure chronic ulcer of other part of left foot with fat layer exposed: Secondary | ICD-10-CM | POA: Diagnosis not present

## 2017-04-13 DIAGNOSIS — L97812 Non-pressure chronic ulcer of other part of right lower leg with fat layer exposed: Secondary | ICD-10-CM | POA: Diagnosis not present

## 2017-04-13 DIAGNOSIS — S81811A Laceration without foreign body, right lower leg, initial encounter: Secondary | ICD-10-CM | POA: Diagnosis not present

## 2017-04-14 DIAGNOSIS — M869 Osteomyelitis, unspecified: Secondary | ICD-10-CM | POA: Diagnosis not present

## 2017-04-14 DIAGNOSIS — L97909 Non-pressure chronic ulcer of unspecified part of unspecified lower leg with unspecified severity: Secondary | ICD-10-CM | POA: Diagnosis not present

## 2017-04-15 DIAGNOSIS — L97909 Non-pressure chronic ulcer of unspecified part of unspecified lower leg with unspecified severity: Secondary | ICD-10-CM | POA: Diagnosis not present

## 2017-04-15 DIAGNOSIS — M869 Osteomyelitis, unspecified: Secondary | ICD-10-CM | POA: Diagnosis not present

## 2017-04-16 DIAGNOSIS — M86661 Other chronic osteomyelitis, right tibia and fibula: Secondary | ICD-10-CM | POA: Diagnosis not present

## 2017-04-16 DIAGNOSIS — Z880 Allergy status to penicillin: Secondary | ICD-10-CM | POA: Diagnosis not present

## 2017-04-16 DIAGNOSIS — Z886 Allergy status to analgesic agent status: Secondary | ICD-10-CM | POA: Diagnosis not present

## 2017-04-16 DIAGNOSIS — M2550 Pain in unspecified joint: Secondary | ICD-10-CM | POA: Diagnosis not present

## 2017-04-16 DIAGNOSIS — E7800 Pure hypercholesterolemia, unspecified: Secondary | ICD-10-CM | POA: Diagnosis not present

## 2017-04-16 DIAGNOSIS — G629 Polyneuropathy, unspecified: Secondary | ICD-10-CM | POA: Diagnosis not present

## 2017-04-16 DIAGNOSIS — M1990 Unspecified osteoarthritis, unspecified site: Secondary | ICD-10-CM | POA: Diagnosis not present

## 2017-04-16 DIAGNOSIS — M8668 Other chronic osteomyelitis, other site: Secondary | ICD-10-CM | POA: Diagnosis not present

## 2017-04-16 DIAGNOSIS — L97322 Non-pressure chronic ulcer of left ankle with fat layer exposed: Secondary | ICD-10-CM | POA: Diagnosis not present

## 2017-04-16 DIAGNOSIS — L97522 Non-pressure chronic ulcer of other part of left foot with fat layer exposed: Secondary | ICD-10-CM | POA: Diagnosis not present

## 2017-04-16 DIAGNOSIS — L97909 Non-pressure chronic ulcer of unspecified part of unspecified lower leg with unspecified severity: Secondary | ICD-10-CM | POA: Diagnosis not present

## 2017-04-16 DIAGNOSIS — L97212 Non-pressure chronic ulcer of right calf with fat layer exposed: Secondary | ICD-10-CM | POA: Diagnosis not present

## 2017-04-16 DIAGNOSIS — M869 Osteomyelitis, unspecified: Secondary | ICD-10-CM | POA: Diagnosis not present

## 2017-04-16 DIAGNOSIS — I872 Venous insufficiency (chronic) (peripheral): Secondary | ICD-10-CM | POA: Diagnosis not present

## 2017-04-16 DIAGNOSIS — I10 Essential (primary) hypertension: Secondary | ICD-10-CM | POA: Diagnosis not present

## 2017-04-16 DIAGNOSIS — I2510 Atherosclerotic heart disease of native coronary artery without angina pectoris: Secondary | ICD-10-CM | POA: Diagnosis not present

## 2017-04-16 DIAGNOSIS — D649 Anemia, unspecified: Secondary | ICD-10-CM | POA: Diagnosis not present

## 2017-04-16 DIAGNOSIS — L97812 Non-pressure chronic ulcer of other part of right lower leg with fat layer exposed: Secondary | ICD-10-CM | POA: Diagnosis not present

## 2017-04-16 DIAGNOSIS — Z87891 Personal history of nicotine dependence: Secondary | ICD-10-CM | POA: Diagnosis not present

## 2017-04-17 DIAGNOSIS — L97909 Non-pressure chronic ulcer of unspecified part of unspecified lower leg with unspecified severity: Secondary | ICD-10-CM | POA: Diagnosis not present

## 2017-04-17 DIAGNOSIS — M869 Osteomyelitis, unspecified: Secondary | ICD-10-CM | POA: Diagnosis not present

## 2017-04-18 DIAGNOSIS — L97909 Non-pressure chronic ulcer of unspecified part of unspecified lower leg with unspecified severity: Secondary | ICD-10-CM | POA: Diagnosis not present

## 2017-04-18 DIAGNOSIS — M869 Osteomyelitis, unspecified: Secondary | ICD-10-CM | POA: Diagnosis not present

## 2017-04-19 DIAGNOSIS — L97909 Non-pressure chronic ulcer of unspecified part of unspecified lower leg with unspecified severity: Secondary | ICD-10-CM | POA: Diagnosis not present

## 2017-04-19 DIAGNOSIS — M86271 Subacute osteomyelitis, right ankle and foot: Secondary | ICD-10-CM | POA: Diagnosis not present

## 2017-04-19 DIAGNOSIS — L97322 Non-pressure chronic ulcer of left ankle with fat layer exposed: Secondary | ICD-10-CM | POA: Diagnosis not present

## 2017-04-19 DIAGNOSIS — L97521 Non-pressure chronic ulcer of other part of left foot limited to breakdown of skin: Secondary | ICD-10-CM | POA: Diagnosis not present

## 2017-04-19 DIAGNOSIS — L97813 Non-pressure chronic ulcer of other part of right lower leg with necrosis of muscle: Secondary | ICD-10-CM | POA: Diagnosis not present

## 2017-04-19 DIAGNOSIS — M150 Primary generalized (osteo)arthritis: Secondary | ICD-10-CM | POA: Diagnosis not present

## 2017-04-19 DIAGNOSIS — L97829 Non-pressure chronic ulcer of other part of left lower leg with unspecified severity: Secondary | ICD-10-CM | POA: Diagnosis not present

## 2017-04-19 DIAGNOSIS — M869 Osteomyelitis, unspecified: Secondary | ICD-10-CM | POA: Diagnosis not present

## 2017-04-20 DIAGNOSIS — M869 Osteomyelitis, unspecified: Secondary | ICD-10-CM | POA: Diagnosis not present

## 2017-04-20 DIAGNOSIS — L97909 Non-pressure chronic ulcer of unspecified part of unspecified lower leg with unspecified severity: Secondary | ICD-10-CM | POA: Diagnosis not present

## 2017-04-21 DIAGNOSIS — M869 Osteomyelitis, unspecified: Secondary | ICD-10-CM | POA: Diagnosis not present

## 2017-04-21 DIAGNOSIS — L97909 Non-pressure chronic ulcer of unspecified part of unspecified lower leg with unspecified severity: Secondary | ICD-10-CM | POA: Diagnosis not present

## 2017-04-22 DIAGNOSIS — L97909 Non-pressure chronic ulcer of unspecified part of unspecified lower leg with unspecified severity: Secondary | ICD-10-CM | POA: Diagnosis not present

## 2017-04-22 DIAGNOSIS — M869 Osteomyelitis, unspecified: Secondary | ICD-10-CM | POA: Diagnosis not present

## 2017-04-23 DIAGNOSIS — E7800 Pure hypercholesterolemia, unspecified: Secondary | ICD-10-CM | POA: Diagnosis not present

## 2017-04-23 DIAGNOSIS — G629 Polyneuropathy, unspecified: Secondary | ICD-10-CM | POA: Diagnosis not present

## 2017-04-23 DIAGNOSIS — Z87891 Personal history of nicotine dependence: Secondary | ICD-10-CM | POA: Diagnosis not present

## 2017-04-23 DIAGNOSIS — L97812 Non-pressure chronic ulcer of other part of right lower leg with fat layer exposed: Secondary | ICD-10-CM | POA: Diagnosis not present

## 2017-04-23 DIAGNOSIS — L97322 Non-pressure chronic ulcer of left ankle with fat layer exposed: Secondary | ICD-10-CM | POA: Diagnosis not present

## 2017-04-23 DIAGNOSIS — I2510 Atherosclerotic heart disease of native coronary artery without angina pectoris: Secondary | ICD-10-CM | POA: Diagnosis not present

## 2017-04-23 DIAGNOSIS — L97212 Non-pressure chronic ulcer of right calf with fat layer exposed: Secondary | ICD-10-CM | POA: Diagnosis not present

## 2017-04-23 DIAGNOSIS — D649 Anemia, unspecified: Secondary | ICD-10-CM | POA: Diagnosis not present

## 2017-04-23 DIAGNOSIS — Z886 Allergy status to analgesic agent status: Secondary | ICD-10-CM | POA: Diagnosis not present

## 2017-04-23 DIAGNOSIS — I10 Essential (primary) hypertension: Secondary | ICD-10-CM | POA: Diagnosis not present

## 2017-04-23 DIAGNOSIS — M869 Osteomyelitis, unspecified: Secondary | ICD-10-CM | POA: Diagnosis not present

## 2017-04-23 DIAGNOSIS — M1990 Unspecified osteoarthritis, unspecified site: Secondary | ICD-10-CM | POA: Diagnosis not present

## 2017-04-23 DIAGNOSIS — L97909 Non-pressure chronic ulcer of unspecified part of unspecified lower leg with unspecified severity: Secondary | ICD-10-CM | POA: Diagnosis not present

## 2017-04-23 DIAGNOSIS — M2550 Pain in unspecified joint: Secondary | ICD-10-CM | POA: Diagnosis not present

## 2017-04-23 DIAGNOSIS — Z880 Allergy status to penicillin: Secondary | ICD-10-CM | POA: Diagnosis not present

## 2017-04-23 DIAGNOSIS — I872 Venous insufficiency (chronic) (peripheral): Secondary | ICD-10-CM | POA: Diagnosis not present

## 2017-04-23 DIAGNOSIS — L97522 Non-pressure chronic ulcer of other part of left foot with fat layer exposed: Secondary | ICD-10-CM | POA: Diagnosis not present

## 2017-04-24 DIAGNOSIS — L97909 Non-pressure chronic ulcer of unspecified part of unspecified lower leg with unspecified severity: Secondary | ICD-10-CM | POA: Diagnosis not present

## 2017-04-24 DIAGNOSIS — M869 Osteomyelitis, unspecified: Secondary | ICD-10-CM | POA: Diagnosis not present

## 2017-04-25 DIAGNOSIS — L97909 Non-pressure chronic ulcer of unspecified part of unspecified lower leg with unspecified severity: Secondary | ICD-10-CM | POA: Diagnosis not present

## 2017-04-25 DIAGNOSIS — M869 Osteomyelitis, unspecified: Secondary | ICD-10-CM | POA: Diagnosis not present

## 2017-04-26 DIAGNOSIS — M150 Primary generalized (osteo)arthritis: Secondary | ICD-10-CM | POA: Diagnosis not present

## 2017-04-26 DIAGNOSIS — M869 Osteomyelitis, unspecified: Secondary | ICD-10-CM | POA: Diagnosis not present

## 2017-04-26 DIAGNOSIS — L97813 Non-pressure chronic ulcer of other part of right lower leg with necrosis of muscle: Secondary | ICD-10-CM | POA: Diagnosis not present

## 2017-04-26 DIAGNOSIS — M86271 Subacute osteomyelitis, right ankle and foot: Secondary | ICD-10-CM | POA: Diagnosis not present

## 2017-04-26 DIAGNOSIS — L97909 Non-pressure chronic ulcer of unspecified part of unspecified lower leg with unspecified severity: Secondary | ICD-10-CM | POA: Diagnosis not present

## 2017-04-26 DIAGNOSIS — L97322 Non-pressure chronic ulcer of left ankle with fat layer exposed: Secondary | ICD-10-CM | POA: Diagnosis not present

## 2017-04-26 DIAGNOSIS — L97521 Non-pressure chronic ulcer of other part of left foot limited to breakdown of skin: Secondary | ICD-10-CM | POA: Diagnosis not present

## 2017-04-26 DIAGNOSIS — L97829 Non-pressure chronic ulcer of other part of left lower leg with unspecified severity: Secondary | ICD-10-CM | POA: Diagnosis not present

## 2017-04-27 DIAGNOSIS — M869 Osteomyelitis, unspecified: Secondary | ICD-10-CM | POA: Diagnosis not present

## 2017-04-27 DIAGNOSIS — L97909 Non-pressure chronic ulcer of unspecified part of unspecified lower leg with unspecified severity: Secondary | ICD-10-CM | POA: Diagnosis not present

## 2017-04-28 DIAGNOSIS — L97909 Non-pressure chronic ulcer of unspecified part of unspecified lower leg with unspecified severity: Secondary | ICD-10-CM | POA: Diagnosis not present

## 2017-04-28 DIAGNOSIS — M869 Osteomyelitis, unspecified: Secondary | ICD-10-CM | POA: Diagnosis not present

## 2017-04-29 DIAGNOSIS — L97909 Non-pressure chronic ulcer of unspecified part of unspecified lower leg with unspecified severity: Secondary | ICD-10-CM | POA: Diagnosis not present

## 2017-04-29 DIAGNOSIS — M869 Osteomyelitis, unspecified: Secondary | ICD-10-CM | POA: Diagnosis not present

## 2017-04-30 DIAGNOSIS — Z886 Allergy status to analgesic agent status: Secondary | ICD-10-CM | POA: Diagnosis not present

## 2017-04-30 DIAGNOSIS — F0390 Unspecified dementia without behavioral disturbance: Secondary | ICD-10-CM | POA: Diagnosis not present

## 2017-04-30 DIAGNOSIS — L97322 Non-pressure chronic ulcer of left ankle with fat layer exposed: Secondary | ICD-10-CM | POA: Diagnosis not present

## 2017-04-30 DIAGNOSIS — L97812 Non-pressure chronic ulcer of other part of right lower leg with fat layer exposed: Secondary | ICD-10-CM | POA: Diagnosis not present

## 2017-04-30 DIAGNOSIS — L97522 Non-pressure chronic ulcer of other part of left foot with fat layer exposed: Secondary | ICD-10-CM | POA: Diagnosis not present

## 2017-04-30 DIAGNOSIS — I872 Venous insufficiency (chronic) (peripheral): Secondary | ICD-10-CM | POA: Diagnosis not present

## 2017-04-30 DIAGNOSIS — G629 Polyneuropathy, unspecified: Secondary | ICD-10-CM | POA: Diagnosis not present

## 2017-04-30 DIAGNOSIS — Z880 Allergy status to penicillin: Secondary | ICD-10-CM | POA: Diagnosis not present

## 2017-04-30 DIAGNOSIS — L97212 Non-pressure chronic ulcer of right calf with fat layer exposed: Secondary | ICD-10-CM | POA: Diagnosis not present

## 2017-04-30 DIAGNOSIS — I10 Essential (primary) hypertension: Secondary | ICD-10-CM | POA: Diagnosis not present

## 2017-05-03 DIAGNOSIS — M869 Osteomyelitis, unspecified: Secondary | ICD-10-CM | POA: Diagnosis not present

## 2017-05-03 DIAGNOSIS — M86361 Chronic multifocal osteomyelitis, right tibia and fibula: Secondary | ICD-10-CM | POA: Diagnosis not present

## 2017-05-03 DIAGNOSIS — I872 Venous insufficiency (chronic) (peripheral): Secondary | ICD-10-CM | POA: Diagnosis not present

## 2017-05-03 DIAGNOSIS — M86371 Chronic multifocal osteomyelitis, right ankle and foot: Secondary | ICD-10-CM | POA: Diagnosis not present

## 2017-05-03 DIAGNOSIS — I10 Essential (primary) hypertension: Secondary | ICD-10-CM | POA: Diagnosis not present

## 2017-05-04 DIAGNOSIS — L97813 Non-pressure chronic ulcer of other part of right lower leg with necrosis of muscle: Secondary | ICD-10-CM | POA: Diagnosis not present

## 2017-05-04 DIAGNOSIS — M86271 Subacute osteomyelitis, right ankle and foot: Secondary | ICD-10-CM | POA: Diagnosis not present

## 2017-05-04 DIAGNOSIS — L97829 Non-pressure chronic ulcer of other part of left lower leg with unspecified severity: Secondary | ICD-10-CM | POA: Diagnosis not present

## 2017-05-04 DIAGNOSIS — L97322 Non-pressure chronic ulcer of left ankle with fat layer exposed: Secondary | ICD-10-CM | POA: Diagnosis not present

## 2017-05-04 DIAGNOSIS — L97521 Non-pressure chronic ulcer of other part of left foot limited to breakdown of skin: Secondary | ICD-10-CM | POA: Diagnosis not present

## 2017-05-04 DIAGNOSIS — M150 Primary generalized (osteo)arthritis: Secondary | ICD-10-CM | POA: Diagnosis not present

## 2017-05-05 DIAGNOSIS — M150 Primary generalized (osteo)arthritis: Secondary | ICD-10-CM | POA: Diagnosis not present

## 2017-05-05 DIAGNOSIS — L97521 Non-pressure chronic ulcer of other part of left foot limited to breakdown of skin: Secondary | ICD-10-CM | POA: Diagnosis not present

## 2017-05-05 DIAGNOSIS — M86271 Subacute osteomyelitis, right ankle and foot: Secondary | ICD-10-CM | POA: Diagnosis not present

## 2017-05-05 DIAGNOSIS — L97322 Non-pressure chronic ulcer of left ankle with fat layer exposed: Secondary | ICD-10-CM | POA: Diagnosis not present

## 2017-05-05 DIAGNOSIS — L97829 Non-pressure chronic ulcer of other part of left lower leg with unspecified severity: Secondary | ICD-10-CM | POA: Diagnosis not present

## 2017-05-05 DIAGNOSIS — L97813 Non-pressure chronic ulcer of other part of right lower leg with necrosis of muscle: Secondary | ICD-10-CM | POA: Diagnosis not present

## 2017-05-07 DIAGNOSIS — G629 Polyneuropathy, unspecified: Secondary | ICD-10-CM | POA: Diagnosis not present

## 2017-05-07 DIAGNOSIS — L97322 Non-pressure chronic ulcer of left ankle with fat layer exposed: Secondary | ICD-10-CM | POA: Diagnosis not present

## 2017-05-07 DIAGNOSIS — Z833 Family history of diabetes mellitus: Secondary | ICD-10-CM | POA: Diagnosis not present

## 2017-05-07 DIAGNOSIS — I10 Essential (primary) hypertension: Secondary | ICD-10-CM | POA: Diagnosis not present

## 2017-05-07 DIAGNOSIS — L97522 Non-pressure chronic ulcer of other part of left foot with fat layer exposed: Secondary | ICD-10-CM | POA: Diagnosis not present

## 2017-05-07 DIAGNOSIS — Z87891 Personal history of nicotine dependence: Secondary | ICD-10-CM | POA: Diagnosis not present

## 2017-05-07 DIAGNOSIS — I872 Venous insufficiency (chronic) (peripheral): Secondary | ICD-10-CM | POA: Diagnosis not present

## 2017-05-07 DIAGNOSIS — L97812 Non-pressure chronic ulcer of other part of right lower leg with fat layer exposed: Secondary | ICD-10-CM | POA: Diagnosis not present

## 2017-05-07 DIAGNOSIS — M1990 Unspecified osteoarthritis, unspecified site: Secondary | ICD-10-CM | POA: Diagnosis not present

## 2017-05-07 DIAGNOSIS — Z886 Allergy status to analgesic agent status: Secondary | ICD-10-CM | POA: Diagnosis not present

## 2017-05-07 DIAGNOSIS — Z880 Allergy status to penicillin: Secondary | ICD-10-CM | POA: Diagnosis not present

## 2017-05-08 DIAGNOSIS — I2510 Atherosclerotic heart disease of native coronary artery without angina pectoris: Secondary | ICD-10-CM | POA: Diagnosis not present

## 2017-05-08 DIAGNOSIS — L97909 Non-pressure chronic ulcer of unspecified part of unspecified lower leg with unspecified severity: Secondary | ICD-10-CM | POA: Diagnosis not present

## 2017-05-08 DIAGNOSIS — M2550 Pain in unspecified joint: Secondary | ICD-10-CM | POA: Diagnosis not present

## 2017-05-08 DIAGNOSIS — M869 Osteomyelitis, unspecified: Secondary | ICD-10-CM | POA: Diagnosis not present

## 2017-05-08 DIAGNOSIS — E7800 Pure hypercholesterolemia, unspecified: Secondary | ICD-10-CM | POA: Diagnosis not present

## 2017-05-08 DIAGNOSIS — D649 Anemia, unspecified: Secondary | ICD-10-CM | POA: Diagnosis not present

## 2017-05-09 DIAGNOSIS — L97909 Non-pressure chronic ulcer of unspecified part of unspecified lower leg with unspecified severity: Secondary | ICD-10-CM | POA: Diagnosis not present

## 2017-05-09 DIAGNOSIS — M869 Osteomyelitis, unspecified: Secondary | ICD-10-CM | POA: Diagnosis not present

## 2017-05-10 DIAGNOSIS — M150 Primary generalized (osteo)arthritis: Secondary | ICD-10-CM | POA: Diagnosis not present

## 2017-05-10 DIAGNOSIS — L97909 Non-pressure chronic ulcer of unspecified part of unspecified lower leg with unspecified severity: Secondary | ICD-10-CM | POA: Diagnosis not present

## 2017-05-10 DIAGNOSIS — M869 Osteomyelitis, unspecified: Secondary | ICD-10-CM | POA: Diagnosis not present

## 2017-05-10 DIAGNOSIS — L97829 Non-pressure chronic ulcer of other part of left lower leg with unspecified severity: Secondary | ICD-10-CM | POA: Diagnosis not present

## 2017-05-10 DIAGNOSIS — L97813 Non-pressure chronic ulcer of other part of right lower leg with necrosis of muscle: Secondary | ICD-10-CM | POA: Diagnosis not present

## 2017-05-10 DIAGNOSIS — L97322 Non-pressure chronic ulcer of left ankle with fat layer exposed: Secondary | ICD-10-CM | POA: Diagnosis not present

## 2017-05-10 DIAGNOSIS — M86271 Subacute osteomyelitis, right ankle and foot: Secondary | ICD-10-CM | POA: Diagnosis not present

## 2017-05-10 DIAGNOSIS — L97521 Non-pressure chronic ulcer of other part of left foot limited to breakdown of skin: Secondary | ICD-10-CM | POA: Diagnosis not present

## 2017-05-11 DIAGNOSIS — L97909 Non-pressure chronic ulcer of unspecified part of unspecified lower leg with unspecified severity: Secondary | ICD-10-CM | POA: Diagnosis not present

## 2017-05-11 DIAGNOSIS — M869 Osteomyelitis, unspecified: Secondary | ICD-10-CM | POA: Diagnosis not present

## 2017-05-12 DIAGNOSIS — M869 Osteomyelitis, unspecified: Secondary | ICD-10-CM | POA: Diagnosis not present

## 2017-05-12 DIAGNOSIS — L97909 Non-pressure chronic ulcer of unspecified part of unspecified lower leg with unspecified severity: Secondary | ICD-10-CM | POA: Diagnosis not present

## 2017-05-13 DIAGNOSIS — M869 Osteomyelitis, unspecified: Secondary | ICD-10-CM | POA: Diagnosis not present

## 2017-05-13 DIAGNOSIS — L97909 Non-pressure chronic ulcer of unspecified part of unspecified lower leg with unspecified severity: Secondary | ICD-10-CM | POA: Diagnosis not present

## 2017-05-14 DIAGNOSIS — Z880 Allergy status to penicillin: Secondary | ICD-10-CM | POA: Diagnosis not present

## 2017-05-14 DIAGNOSIS — L97822 Non-pressure chronic ulcer of other part of left lower leg with fat layer exposed: Secondary | ICD-10-CM | POA: Diagnosis not present

## 2017-05-14 DIAGNOSIS — L97909 Non-pressure chronic ulcer of unspecified part of unspecified lower leg with unspecified severity: Secondary | ICD-10-CM | POA: Diagnosis not present

## 2017-05-14 DIAGNOSIS — G629 Polyneuropathy, unspecified: Secondary | ICD-10-CM | POA: Diagnosis not present

## 2017-05-14 DIAGNOSIS — L97219 Non-pressure chronic ulcer of right calf with unspecified severity: Secondary | ICD-10-CM | POA: Diagnosis not present

## 2017-05-14 DIAGNOSIS — M869 Osteomyelitis, unspecified: Secondary | ICD-10-CM | POA: Diagnosis not present

## 2017-05-14 DIAGNOSIS — L97322 Non-pressure chronic ulcer of left ankle with fat layer exposed: Secondary | ICD-10-CM | POA: Diagnosis not present

## 2017-05-14 DIAGNOSIS — L97522 Non-pressure chronic ulcer of other part of left foot with fat layer exposed: Secondary | ICD-10-CM | POA: Diagnosis not present

## 2017-05-14 DIAGNOSIS — L97812 Non-pressure chronic ulcer of other part of right lower leg with fat layer exposed: Secondary | ICD-10-CM | POA: Diagnosis not present

## 2017-05-14 DIAGNOSIS — I872 Venous insufficiency (chronic) (peripheral): Secondary | ICD-10-CM | POA: Diagnosis not present

## 2017-05-14 DIAGNOSIS — M1990 Unspecified osteoarthritis, unspecified site: Secondary | ICD-10-CM | POA: Diagnosis not present

## 2017-05-14 DIAGNOSIS — Z886 Allergy status to analgesic agent status: Secondary | ICD-10-CM | POA: Diagnosis not present

## 2017-05-14 DIAGNOSIS — I10 Essential (primary) hypertension: Secondary | ICD-10-CM | POA: Diagnosis not present

## 2017-05-14 DIAGNOSIS — Z87891 Personal history of nicotine dependence: Secondary | ICD-10-CM | POA: Diagnosis not present

## 2017-05-15 DIAGNOSIS — M2550 Pain in unspecified joint: Secondary | ICD-10-CM | POA: Diagnosis not present

## 2017-05-15 DIAGNOSIS — D649 Anemia, unspecified: Secondary | ICD-10-CM | POA: Diagnosis not present

## 2017-05-15 DIAGNOSIS — L97909 Non-pressure chronic ulcer of unspecified part of unspecified lower leg with unspecified severity: Secondary | ICD-10-CM | POA: Diagnosis not present

## 2017-05-15 DIAGNOSIS — E7800 Pure hypercholesterolemia, unspecified: Secondary | ICD-10-CM | POA: Diagnosis not present

## 2017-05-15 DIAGNOSIS — I2510 Atherosclerotic heart disease of native coronary artery without angina pectoris: Secondary | ICD-10-CM | POA: Diagnosis not present

## 2017-05-15 DIAGNOSIS — M869 Osteomyelitis, unspecified: Secondary | ICD-10-CM | POA: Diagnosis not present

## 2017-05-16 DIAGNOSIS — M869 Osteomyelitis, unspecified: Secondary | ICD-10-CM | POA: Diagnosis not present

## 2017-05-16 DIAGNOSIS — L97909 Non-pressure chronic ulcer of unspecified part of unspecified lower leg with unspecified severity: Secondary | ICD-10-CM | POA: Diagnosis not present

## 2017-05-17 DIAGNOSIS — L97322 Non-pressure chronic ulcer of left ankle with fat layer exposed: Secondary | ICD-10-CM | POA: Diagnosis not present

## 2017-05-17 DIAGNOSIS — M869 Osteomyelitis, unspecified: Secondary | ICD-10-CM | POA: Diagnosis not present

## 2017-05-17 DIAGNOSIS — L97521 Non-pressure chronic ulcer of other part of left foot limited to breakdown of skin: Secondary | ICD-10-CM | POA: Diagnosis not present

## 2017-05-17 DIAGNOSIS — L97829 Non-pressure chronic ulcer of other part of left lower leg with unspecified severity: Secondary | ICD-10-CM | POA: Diagnosis not present

## 2017-05-17 DIAGNOSIS — M86271 Subacute osteomyelitis, right ankle and foot: Secondary | ICD-10-CM | POA: Diagnosis not present

## 2017-05-17 DIAGNOSIS — L97909 Non-pressure chronic ulcer of unspecified part of unspecified lower leg with unspecified severity: Secondary | ICD-10-CM | POA: Diagnosis not present

## 2017-05-17 DIAGNOSIS — L97813 Non-pressure chronic ulcer of other part of right lower leg with necrosis of muscle: Secondary | ICD-10-CM | POA: Diagnosis not present

## 2017-05-17 DIAGNOSIS — M150 Primary generalized (osteo)arthritis: Secondary | ICD-10-CM | POA: Diagnosis not present

## 2017-05-18 DIAGNOSIS — M869 Osteomyelitis, unspecified: Secondary | ICD-10-CM | POA: Diagnosis not present

## 2017-05-18 DIAGNOSIS — L97909 Non-pressure chronic ulcer of unspecified part of unspecified lower leg with unspecified severity: Secondary | ICD-10-CM | POA: Diagnosis not present

## 2017-05-19 DIAGNOSIS — L97909 Non-pressure chronic ulcer of unspecified part of unspecified lower leg with unspecified severity: Secondary | ICD-10-CM | POA: Diagnosis not present

## 2017-05-19 DIAGNOSIS — M869 Osteomyelitis, unspecified: Secondary | ICD-10-CM | POA: Diagnosis not present

## 2017-05-20 DIAGNOSIS — L97909 Non-pressure chronic ulcer of unspecified part of unspecified lower leg with unspecified severity: Secondary | ICD-10-CM | POA: Diagnosis not present

## 2017-05-20 DIAGNOSIS — M869 Osteomyelitis, unspecified: Secondary | ICD-10-CM | POA: Diagnosis not present

## 2017-05-21 DIAGNOSIS — M869 Osteomyelitis, unspecified: Secondary | ICD-10-CM | POA: Diagnosis not present

## 2017-05-21 DIAGNOSIS — L97522 Non-pressure chronic ulcer of other part of left foot with fat layer exposed: Secondary | ICD-10-CM | POA: Diagnosis not present

## 2017-05-21 DIAGNOSIS — Z87891 Personal history of nicotine dependence: Secondary | ICD-10-CM | POA: Diagnosis not present

## 2017-05-21 DIAGNOSIS — I10 Essential (primary) hypertension: Secondary | ICD-10-CM | POA: Diagnosis not present

## 2017-05-21 DIAGNOSIS — L97909 Non-pressure chronic ulcer of unspecified part of unspecified lower leg with unspecified severity: Secondary | ICD-10-CM | POA: Diagnosis not present

## 2017-05-21 DIAGNOSIS — M1990 Unspecified osteoarthritis, unspecified site: Secondary | ICD-10-CM | POA: Diagnosis not present

## 2017-05-21 DIAGNOSIS — Z880 Allergy status to penicillin: Secondary | ICD-10-CM | POA: Diagnosis not present

## 2017-05-21 DIAGNOSIS — I872 Venous insufficiency (chronic) (peripheral): Secondary | ICD-10-CM | POA: Diagnosis not present

## 2017-05-21 DIAGNOSIS — L97812 Non-pressure chronic ulcer of other part of right lower leg with fat layer exposed: Secondary | ICD-10-CM | POA: Diagnosis not present

## 2017-05-21 DIAGNOSIS — L97822 Non-pressure chronic ulcer of other part of left lower leg with fat layer exposed: Secondary | ICD-10-CM | POA: Diagnosis not present

## 2017-05-21 DIAGNOSIS — L97322 Non-pressure chronic ulcer of left ankle with fat layer exposed: Secondary | ICD-10-CM | POA: Diagnosis not present

## 2017-05-21 DIAGNOSIS — G629 Polyneuropathy, unspecified: Secondary | ICD-10-CM | POA: Diagnosis not present

## 2017-05-21 DIAGNOSIS — Z886 Allergy status to analgesic agent status: Secondary | ICD-10-CM | POA: Diagnosis not present

## 2017-05-22 DIAGNOSIS — M2550 Pain in unspecified joint: Secondary | ICD-10-CM | POA: Diagnosis not present

## 2017-05-22 DIAGNOSIS — D649 Anemia, unspecified: Secondary | ICD-10-CM | POA: Diagnosis not present

## 2017-05-22 DIAGNOSIS — L97909 Non-pressure chronic ulcer of unspecified part of unspecified lower leg with unspecified severity: Secondary | ICD-10-CM | POA: Diagnosis not present

## 2017-05-22 DIAGNOSIS — E7800 Pure hypercholesterolemia, unspecified: Secondary | ICD-10-CM | POA: Diagnosis not present

## 2017-05-22 DIAGNOSIS — M869 Osteomyelitis, unspecified: Secondary | ICD-10-CM | POA: Diagnosis not present

## 2017-05-22 DIAGNOSIS — I2510 Atherosclerotic heart disease of native coronary artery without angina pectoris: Secondary | ICD-10-CM | POA: Diagnosis not present

## 2017-05-23 DIAGNOSIS — M869 Osteomyelitis, unspecified: Secondary | ICD-10-CM | POA: Diagnosis not present

## 2017-05-23 DIAGNOSIS — L97909 Non-pressure chronic ulcer of unspecified part of unspecified lower leg with unspecified severity: Secondary | ICD-10-CM | POA: Diagnosis not present

## 2017-05-24 DIAGNOSIS — L97322 Non-pressure chronic ulcer of left ankle with fat layer exposed: Secondary | ICD-10-CM | POA: Diagnosis not present

## 2017-05-24 DIAGNOSIS — M150 Primary generalized (osteo)arthritis: Secondary | ICD-10-CM | POA: Diagnosis not present

## 2017-05-24 DIAGNOSIS — L97829 Non-pressure chronic ulcer of other part of left lower leg with unspecified severity: Secondary | ICD-10-CM | POA: Diagnosis not present

## 2017-05-24 DIAGNOSIS — M869 Osteomyelitis, unspecified: Secondary | ICD-10-CM | POA: Diagnosis not present

## 2017-05-24 DIAGNOSIS — L97813 Non-pressure chronic ulcer of other part of right lower leg with necrosis of muscle: Secondary | ICD-10-CM | POA: Diagnosis not present

## 2017-05-24 DIAGNOSIS — L97909 Non-pressure chronic ulcer of unspecified part of unspecified lower leg with unspecified severity: Secondary | ICD-10-CM | POA: Diagnosis not present

## 2017-05-24 DIAGNOSIS — M86271 Subacute osteomyelitis, right ankle and foot: Secondary | ICD-10-CM | POA: Diagnosis not present

## 2017-05-24 DIAGNOSIS — L97521 Non-pressure chronic ulcer of other part of left foot limited to breakdown of skin: Secondary | ICD-10-CM | POA: Diagnosis not present

## 2017-05-25 DIAGNOSIS — M869 Osteomyelitis, unspecified: Secondary | ICD-10-CM | POA: Diagnosis not present

## 2017-05-25 DIAGNOSIS — L97909 Non-pressure chronic ulcer of unspecified part of unspecified lower leg with unspecified severity: Secondary | ICD-10-CM | POA: Diagnosis not present

## 2017-05-26 DIAGNOSIS — M869 Osteomyelitis, unspecified: Secondary | ICD-10-CM | POA: Diagnosis not present

## 2017-05-26 DIAGNOSIS — L97909 Non-pressure chronic ulcer of unspecified part of unspecified lower leg with unspecified severity: Secondary | ICD-10-CM | POA: Diagnosis not present

## 2017-05-27 DIAGNOSIS — M869 Osteomyelitis, unspecified: Secondary | ICD-10-CM | POA: Diagnosis not present

## 2017-05-27 DIAGNOSIS — L97909 Non-pressure chronic ulcer of unspecified part of unspecified lower leg with unspecified severity: Secondary | ICD-10-CM | POA: Diagnosis not present

## 2017-05-28 DIAGNOSIS — I10 Essential (primary) hypertension: Secondary | ICD-10-CM | POA: Diagnosis not present

## 2017-05-28 DIAGNOSIS — L97909 Non-pressure chronic ulcer of unspecified part of unspecified lower leg with unspecified severity: Secondary | ICD-10-CM | POA: Diagnosis not present

## 2017-05-28 DIAGNOSIS — L97522 Non-pressure chronic ulcer of other part of left foot with fat layer exposed: Secondary | ICD-10-CM | POA: Diagnosis not present

## 2017-05-28 DIAGNOSIS — Z886 Allergy status to analgesic agent status: Secondary | ICD-10-CM | POA: Diagnosis not present

## 2017-05-28 DIAGNOSIS — Z87891 Personal history of nicotine dependence: Secondary | ICD-10-CM | POA: Diagnosis not present

## 2017-05-28 DIAGNOSIS — Z880 Allergy status to penicillin: Secondary | ICD-10-CM | POA: Diagnosis not present

## 2017-05-28 DIAGNOSIS — I872 Venous insufficiency (chronic) (peripheral): Secondary | ICD-10-CM | POA: Diagnosis not present

## 2017-05-28 DIAGNOSIS — L97812 Non-pressure chronic ulcer of other part of right lower leg with fat layer exposed: Secondary | ICD-10-CM | POA: Diagnosis not present

## 2017-05-28 DIAGNOSIS — L97322 Non-pressure chronic ulcer of left ankle with fat layer exposed: Secondary | ICD-10-CM | POA: Diagnosis not present

## 2017-05-28 DIAGNOSIS — M869 Osteomyelitis, unspecified: Secondary | ICD-10-CM | POA: Diagnosis not present

## 2017-05-28 DIAGNOSIS — G629 Polyneuropathy, unspecified: Secondary | ICD-10-CM | POA: Diagnosis not present

## 2017-05-29 DIAGNOSIS — D649 Anemia, unspecified: Secondary | ICD-10-CM | POA: Diagnosis not present

## 2017-05-29 DIAGNOSIS — E7800 Pure hypercholesterolemia, unspecified: Secondary | ICD-10-CM | POA: Diagnosis not present

## 2017-05-29 DIAGNOSIS — M869 Osteomyelitis, unspecified: Secondary | ICD-10-CM | POA: Diagnosis not present

## 2017-05-29 DIAGNOSIS — M2550 Pain in unspecified joint: Secondary | ICD-10-CM | POA: Diagnosis not present

## 2017-05-29 DIAGNOSIS — L97909 Non-pressure chronic ulcer of unspecified part of unspecified lower leg with unspecified severity: Secondary | ICD-10-CM | POA: Diagnosis not present

## 2017-05-29 DIAGNOSIS — I2510 Atherosclerotic heart disease of native coronary artery without angina pectoris: Secondary | ICD-10-CM | POA: Diagnosis not present

## 2017-05-30 DIAGNOSIS — M869 Osteomyelitis, unspecified: Secondary | ICD-10-CM | POA: Diagnosis not present

## 2017-05-30 DIAGNOSIS — L97909 Non-pressure chronic ulcer of unspecified part of unspecified lower leg with unspecified severity: Secondary | ICD-10-CM | POA: Diagnosis not present

## 2017-05-31 DIAGNOSIS — M810 Age-related osteoporosis without current pathological fracture: Secondary | ICD-10-CM | POA: Diagnosis not present

## 2017-05-31 DIAGNOSIS — M85832 Other specified disorders of bone density and structure, left forearm: Secondary | ICD-10-CM | POA: Diagnosis not present

## 2017-06-01 DIAGNOSIS — M869 Osteomyelitis, unspecified: Secondary | ICD-10-CM | POA: Diagnosis not present

## 2017-06-01 DIAGNOSIS — L97322 Non-pressure chronic ulcer of left ankle with fat layer exposed: Secondary | ICD-10-CM | POA: Diagnosis not present

## 2017-06-01 DIAGNOSIS — L97909 Non-pressure chronic ulcer of unspecified part of unspecified lower leg with unspecified severity: Secondary | ICD-10-CM | POA: Diagnosis not present

## 2017-06-01 DIAGNOSIS — L97829 Non-pressure chronic ulcer of other part of left lower leg with unspecified severity: Secondary | ICD-10-CM | POA: Diagnosis not present

## 2017-06-01 DIAGNOSIS — L97521 Non-pressure chronic ulcer of other part of left foot limited to breakdown of skin: Secondary | ICD-10-CM | POA: Diagnosis not present

## 2017-06-01 DIAGNOSIS — L97813 Non-pressure chronic ulcer of other part of right lower leg with necrosis of muscle: Secondary | ICD-10-CM | POA: Diagnosis not present

## 2017-06-01 DIAGNOSIS — M150 Primary generalized (osteo)arthritis: Secondary | ICD-10-CM | POA: Diagnosis not present

## 2017-06-01 DIAGNOSIS — M86271 Subacute osteomyelitis, right ankle and foot: Secondary | ICD-10-CM | POA: Diagnosis not present

## 2017-06-02 DIAGNOSIS — L97909 Non-pressure chronic ulcer of unspecified part of unspecified lower leg with unspecified severity: Secondary | ICD-10-CM | POA: Diagnosis not present

## 2017-06-02 DIAGNOSIS — M869 Osteomyelitis, unspecified: Secondary | ICD-10-CM | POA: Diagnosis not present

## 2017-06-04 DIAGNOSIS — L84 Corns and callosities: Secondary | ICD-10-CM | POA: Diagnosis not present

## 2017-06-04 DIAGNOSIS — I872 Venous insufficiency (chronic) (peripheral): Secondary | ICD-10-CM | POA: Diagnosis not present

## 2017-06-04 DIAGNOSIS — L97522 Non-pressure chronic ulcer of other part of left foot with fat layer exposed: Secondary | ICD-10-CM | POA: Diagnosis not present

## 2017-06-04 DIAGNOSIS — Z79899 Other long term (current) drug therapy: Secondary | ICD-10-CM | POA: Diagnosis not present

## 2017-06-04 DIAGNOSIS — M1990 Unspecified osteoarthritis, unspecified site: Secondary | ICD-10-CM | POA: Diagnosis not present

## 2017-06-04 DIAGNOSIS — I10 Essential (primary) hypertension: Secondary | ICD-10-CM | POA: Diagnosis not present

## 2017-06-04 DIAGNOSIS — L97512 Non-pressure chronic ulcer of other part of right foot with fat layer exposed: Secondary | ICD-10-CM | POA: Diagnosis not present

## 2017-06-04 DIAGNOSIS — L97812 Non-pressure chronic ulcer of other part of right lower leg with fat layer exposed: Secondary | ICD-10-CM | POA: Diagnosis not present

## 2017-06-04 DIAGNOSIS — Z886 Allergy status to analgesic agent status: Secondary | ICD-10-CM | POA: Diagnosis not present

## 2017-06-04 DIAGNOSIS — Z87891 Personal history of nicotine dependence: Secondary | ICD-10-CM | POA: Diagnosis not present

## 2017-06-04 DIAGNOSIS — Z880 Allergy status to penicillin: Secondary | ICD-10-CM | POA: Diagnosis not present

## 2017-06-04 DIAGNOSIS — G629 Polyneuropathy, unspecified: Secondary | ICD-10-CM | POA: Diagnosis not present

## 2017-06-04 DIAGNOSIS — L97322 Non-pressure chronic ulcer of left ankle with fat layer exposed: Secondary | ICD-10-CM | POA: Diagnosis not present

## 2017-06-04 DIAGNOSIS — S81811A Laceration without foreign body, right lower leg, initial encounter: Secondary | ICD-10-CM | POA: Diagnosis not present

## 2017-06-05 DIAGNOSIS — D649 Anemia, unspecified: Secondary | ICD-10-CM | POA: Diagnosis not present

## 2017-06-05 DIAGNOSIS — E7800 Pure hypercholesterolemia, unspecified: Secondary | ICD-10-CM | POA: Diagnosis not present

## 2017-06-05 DIAGNOSIS — M869 Osteomyelitis, unspecified: Secondary | ICD-10-CM | POA: Diagnosis not present

## 2017-06-05 DIAGNOSIS — L97909 Non-pressure chronic ulcer of unspecified part of unspecified lower leg with unspecified severity: Secondary | ICD-10-CM | POA: Diagnosis not present

## 2017-06-05 DIAGNOSIS — I2510 Atherosclerotic heart disease of native coronary artery without angina pectoris: Secondary | ICD-10-CM | POA: Diagnosis not present

## 2017-06-05 DIAGNOSIS — M2550 Pain in unspecified joint: Secondary | ICD-10-CM | POA: Diagnosis not present

## 2017-06-06 DIAGNOSIS — M869 Osteomyelitis, unspecified: Secondary | ICD-10-CM | POA: Diagnosis not present

## 2017-06-06 DIAGNOSIS — M86361 Chronic multifocal osteomyelitis, right tibia and fibula: Secondary | ICD-10-CM | POA: Diagnosis not present

## 2017-06-06 DIAGNOSIS — M86371 Chronic multifocal osteomyelitis, right ankle and foot: Secondary | ICD-10-CM | POA: Diagnosis not present

## 2017-06-06 DIAGNOSIS — L97909 Non-pressure chronic ulcer of unspecified part of unspecified lower leg with unspecified severity: Secondary | ICD-10-CM | POA: Diagnosis not present

## 2017-06-07 DIAGNOSIS — L97813 Non-pressure chronic ulcer of other part of right lower leg with necrosis of muscle: Secondary | ICD-10-CM | POA: Diagnosis not present

## 2017-06-07 DIAGNOSIS — M150 Primary generalized (osteo)arthritis: Secondary | ICD-10-CM | POA: Diagnosis not present

## 2017-06-07 DIAGNOSIS — M86271 Subacute osteomyelitis, right ankle and foot: Secondary | ICD-10-CM | POA: Diagnosis not present

## 2017-06-07 DIAGNOSIS — L97322 Non-pressure chronic ulcer of left ankle with fat layer exposed: Secondary | ICD-10-CM | POA: Diagnosis not present

## 2017-06-07 DIAGNOSIS — L97909 Non-pressure chronic ulcer of unspecified part of unspecified lower leg with unspecified severity: Secondary | ICD-10-CM | POA: Diagnosis not present

## 2017-06-07 DIAGNOSIS — L97829 Non-pressure chronic ulcer of other part of left lower leg with unspecified severity: Secondary | ICD-10-CM | POA: Diagnosis not present

## 2017-06-07 DIAGNOSIS — M869 Osteomyelitis, unspecified: Secondary | ICD-10-CM | POA: Diagnosis not present

## 2017-06-07 DIAGNOSIS — L97521 Non-pressure chronic ulcer of other part of left foot limited to breakdown of skin: Secondary | ICD-10-CM | POA: Diagnosis not present

## 2017-06-08 DIAGNOSIS — L97909 Non-pressure chronic ulcer of unspecified part of unspecified lower leg with unspecified severity: Secondary | ICD-10-CM | POA: Diagnosis not present

## 2017-06-08 DIAGNOSIS — M869 Osteomyelitis, unspecified: Secondary | ICD-10-CM | POA: Diagnosis not present

## 2017-06-09 DIAGNOSIS — M869 Osteomyelitis, unspecified: Secondary | ICD-10-CM | POA: Diagnosis not present

## 2017-06-09 DIAGNOSIS — L97909 Non-pressure chronic ulcer of unspecified part of unspecified lower leg with unspecified severity: Secondary | ICD-10-CM | POA: Diagnosis not present

## 2017-06-10 DIAGNOSIS — M869 Osteomyelitis, unspecified: Secondary | ICD-10-CM | POA: Diagnosis not present

## 2017-06-10 DIAGNOSIS — L97909 Non-pressure chronic ulcer of unspecified part of unspecified lower leg with unspecified severity: Secondary | ICD-10-CM | POA: Diagnosis not present

## 2017-06-11 DIAGNOSIS — L97909 Non-pressure chronic ulcer of unspecified part of unspecified lower leg with unspecified severity: Secondary | ICD-10-CM | POA: Diagnosis not present

## 2017-06-11 DIAGNOSIS — L97522 Non-pressure chronic ulcer of other part of left foot with fat layer exposed: Secondary | ICD-10-CM | POA: Diagnosis not present

## 2017-06-11 DIAGNOSIS — M869 Osteomyelitis, unspecified: Secondary | ICD-10-CM | POA: Diagnosis not present

## 2017-06-11 DIAGNOSIS — Z880 Allergy status to penicillin: Secondary | ICD-10-CM | POA: Diagnosis not present

## 2017-06-11 DIAGNOSIS — M1990 Unspecified osteoarthritis, unspecified site: Secondary | ICD-10-CM | POA: Diagnosis not present

## 2017-06-11 DIAGNOSIS — I872 Venous insufficiency (chronic) (peripheral): Secondary | ICD-10-CM | POA: Diagnosis not present

## 2017-06-11 DIAGNOSIS — L97812 Non-pressure chronic ulcer of other part of right lower leg with fat layer exposed: Secondary | ICD-10-CM | POA: Diagnosis not present

## 2017-06-11 DIAGNOSIS — G629 Polyneuropathy, unspecified: Secondary | ICD-10-CM | POA: Diagnosis not present

## 2017-06-11 DIAGNOSIS — Z886 Allergy status to analgesic agent status: Secondary | ICD-10-CM | POA: Diagnosis not present

## 2017-06-11 DIAGNOSIS — Z87891 Personal history of nicotine dependence: Secondary | ICD-10-CM | POA: Diagnosis not present

## 2017-06-11 DIAGNOSIS — Z79899 Other long term (current) drug therapy: Secondary | ICD-10-CM | POA: Diagnosis not present

## 2017-06-11 DIAGNOSIS — I10 Essential (primary) hypertension: Secondary | ICD-10-CM | POA: Diagnosis not present

## 2017-06-11 DIAGNOSIS — L97322 Non-pressure chronic ulcer of left ankle with fat layer exposed: Secondary | ICD-10-CM | POA: Diagnosis not present

## 2017-06-12 DIAGNOSIS — L97909 Non-pressure chronic ulcer of unspecified part of unspecified lower leg with unspecified severity: Secondary | ICD-10-CM | POA: Diagnosis not present

## 2017-06-12 DIAGNOSIS — I2510 Atherosclerotic heart disease of native coronary artery without angina pectoris: Secondary | ICD-10-CM | POA: Diagnosis not present

## 2017-06-12 DIAGNOSIS — E7800 Pure hypercholesterolemia, unspecified: Secondary | ICD-10-CM | POA: Diagnosis not present

## 2017-06-12 DIAGNOSIS — D649 Anemia, unspecified: Secondary | ICD-10-CM | POA: Diagnosis not present

## 2017-06-12 DIAGNOSIS — M2550 Pain in unspecified joint: Secondary | ICD-10-CM | POA: Diagnosis not present

## 2017-06-12 DIAGNOSIS — M869 Osteomyelitis, unspecified: Secondary | ICD-10-CM | POA: Diagnosis not present

## 2017-06-13 DIAGNOSIS — M869 Osteomyelitis, unspecified: Secondary | ICD-10-CM | POA: Diagnosis not present

## 2017-06-13 DIAGNOSIS — L97909 Non-pressure chronic ulcer of unspecified part of unspecified lower leg with unspecified severity: Secondary | ICD-10-CM | POA: Diagnosis not present

## 2017-06-14 DIAGNOSIS — M150 Primary generalized (osteo)arthritis: Secondary | ICD-10-CM | POA: Diagnosis not present

## 2017-06-14 DIAGNOSIS — M869 Osteomyelitis, unspecified: Secondary | ICD-10-CM | POA: Diagnosis not present

## 2017-06-14 DIAGNOSIS — L97813 Non-pressure chronic ulcer of other part of right lower leg with necrosis of muscle: Secondary | ICD-10-CM | POA: Diagnosis not present

## 2017-06-14 DIAGNOSIS — L97521 Non-pressure chronic ulcer of other part of left foot limited to breakdown of skin: Secondary | ICD-10-CM | POA: Diagnosis not present

## 2017-06-14 DIAGNOSIS — L97909 Non-pressure chronic ulcer of unspecified part of unspecified lower leg with unspecified severity: Secondary | ICD-10-CM | POA: Diagnosis not present

## 2017-06-14 DIAGNOSIS — L97322 Non-pressure chronic ulcer of left ankle with fat layer exposed: Secondary | ICD-10-CM | POA: Diagnosis not present

## 2017-06-14 DIAGNOSIS — L97829 Non-pressure chronic ulcer of other part of left lower leg with unspecified severity: Secondary | ICD-10-CM | POA: Diagnosis not present

## 2017-06-14 DIAGNOSIS — M86271 Subacute osteomyelitis, right ankle and foot: Secondary | ICD-10-CM | POA: Diagnosis not present

## 2017-06-15 DIAGNOSIS — M869 Osteomyelitis, unspecified: Secondary | ICD-10-CM | POA: Diagnosis not present

## 2017-06-15 DIAGNOSIS — L97909 Non-pressure chronic ulcer of unspecified part of unspecified lower leg with unspecified severity: Secondary | ICD-10-CM | POA: Diagnosis not present

## 2017-06-16 DIAGNOSIS — L97909 Non-pressure chronic ulcer of unspecified part of unspecified lower leg with unspecified severity: Secondary | ICD-10-CM | POA: Diagnosis not present

## 2017-06-16 DIAGNOSIS — M869 Osteomyelitis, unspecified: Secondary | ICD-10-CM | POA: Diagnosis not present

## 2017-06-17 DIAGNOSIS — M869 Osteomyelitis, unspecified: Secondary | ICD-10-CM | POA: Diagnosis not present

## 2017-06-17 DIAGNOSIS — L97909 Non-pressure chronic ulcer of unspecified part of unspecified lower leg with unspecified severity: Secondary | ICD-10-CM | POA: Diagnosis not present

## 2017-06-18 DIAGNOSIS — L97909 Non-pressure chronic ulcer of unspecified part of unspecified lower leg with unspecified severity: Secondary | ICD-10-CM | POA: Diagnosis not present

## 2017-06-18 DIAGNOSIS — M86271 Subacute osteomyelitis, right ankle and foot: Secondary | ICD-10-CM | POA: Diagnosis not present

## 2017-06-18 DIAGNOSIS — M869 Osteomyelitis, unspecified: Secondary | ICD-10-CM | POA: Diagnosis not present

## 2017-06-18 DIAGNOSIS — L97322 Non-pressure chronic ulcer of left ankle with fat layer exposed: Secondary | ICD-10-CM | POA: Diagnosis not present

## 2017-06-18 DIAGNOSIS — L97829 Non-pressure chronic ulcer of other part of left lower leg with unspecified severity: Secondary | ICD-10-CM | POA: Diagnosis not present

## 2017-06-18 DIAGNOSIS — L97521 Non-pressure chronic ulcer of other part of left foot limited to breakdown of skin: Secondary | ICD-10-CM | POA: Diagnosis not present

## 2017-06-18 DIAGNOSIS — L97813 Non-pressure chronic ulcer of other part of right lower leg with necrosis of muscle: Secondary | ICD-10-CM | POA: Diagnosis not present

## 2017-06-18 DIAGNOSIS — M150 Primary generalized (osteo)arthritis: Secondary | ICD-10-CM | POA: Diagnosis not present

## 2017-06-19 DIAGNOSIS — D649 Anemia, unspecified: Secondary | ICD-10-CM | POA: Diagnosis not present

## 2017-06-19 DIAGNOSIS — I2510 Atherosclerotic heart disease of native coronary artery without angina pectoris: Secondary | ICD-10-CM | POA: Diagnosis not present

## 2017-06-19 DIAGNOSIS — M869 Osteomyelitis, unspecified: Secondary | ICD-10-CM | POA: Diagnosis not present

## 2017-06-19 DIAGNOSIS — L97909 Non-pressure chronic ulcer of unspecified part of unspecified lower leg with unspecified severity: Secondary | ICD-10-CM | POA: Diagnosis not present

## 2017-06-19 DIAGNOSIS — E7800 Pure hypercholesterolemia, unspecified: Secondary | ICD-10-CM | POA: Diagnosis not present

## 2017-06-19 DIAGNOSIS — M2550 Pain in unspecified joint: Secondary | ICD-10-CM | POA: Diagnosis not present

## 2017-06-20 DIAGNOSIS — M869 Osteomyelitis, unspecified: Secondary | ICD-10-CM | POA: Diagnosis not present

## 2017-06-20 DIAGNOSIS — L97909 Non-pressure chronic ulcer of unspecified part of unspecified lower leg with unspecified severity: Secondary | ICD-10-CM | POA: Diagnosis not present

## 2017-06-21 DIAGNOSIS — M86271 Subacute osteomyelitis, right ankle and foot: Secondary | ICD-10-CM | POA: Diagnosis not present

## 2017-06-21 DIAGNOSIS — L97909 Non-pressure chronic ulcer of unspecified part of unspecified lower leg with unspecified severity: Secondary | ICD-10-CM | POA: Diagnosis not present

## 2017-06-21 DIAGNOSIS — L97829 Non-pressure chronic ulcer of other part of left lower leg with unspecified severity: Secondary | ICD-10-CM | POA: Diagnosis not present

## 2017-06-21 DIAGNOSIS — L97521 Non-pressure chronic ulcer of other part of left foot limited to breakdown of skin: Secondary | ICD-10-CM | POA: Diagnosis not present

## 2017-06-21 DIAGNOSIS — M869 Osteomyelitis, unspecified: Secondary | ICD-10-CM | POA: Diagnosis not present

## 2017-06-21 DIAGNOSIS — L97322 Non-pressure chronic ulcer of left ankle with fat layer exposed: Secondary | ICD-10-CM | POA: Diagnosis not present

## 2017-06-21 DIAGNOSIS — L97813 Non-pressure chronic ulcer of other part of right lower leg with necrosis of muscle: Secondary | ICD-10-CM | POA: Diagnosis not present

## 2017-06-21 DIAGNOSIS — M150 Primary generalized (osteo)arthritis: Secondary | ICD-10-CM | POA: Diagnosis not present

## 2017-06-22 DIAGNOSIS — M869 Osteomyelitis, unspecified: Secondary | ICD-10-CM | POA: Diagnosis not present

## 2017-06-22 DIAGNOSIS — L97909 Non-pressure chronic ulcer of unspecified part of unspecified lower leg with unspecified severity: Secondary | ICD-10-CM | POA: Diagnosis not present

## 2017-06-23 DIAGNOSIS — M869 Osteomyelitis, unspecified: Secondary | ICD-10-CM | POA: Diagnosis not present

## 2017-06-23 DIAGNOSIS — L97909 Non-pressure chronic ulcer of unspecified part of unspecified lower leg with unspecified severity: Secondary | ICD-10-CM | POA: Diagnosis not present

## 2017-06-24 DIAGNOSIS — L97909 Non-pressure chronic ulcer of unspecified part of unspecified lower leg with unspecified severity: Secondary | ICD-10-CM | POA: Diagnosis not present

## 2017-06-24 DIAGNOSIS — M869 Osteomyelitis, unspecified: Secondary | ICD-10-CM | POA: Diagnosis not present

## 2017-06-25 DIAGNOSIS — L97909 Non-pressure chronic ulcer of unspecified part of unspecified lower leg with unspecified severity: Secondary | ICD-10-CM | POA: Diagnosis not present

## 2017-06-25 DIAGNOSIS — L97829 Non-pressure chronic ulcer of other part of left lower leg with unspecified severity: Secondary | ICD-10-CM | POA: Diagnosis not present

## 2017-06-25 DIAGNOSIS — M150 Primary generalized (osteo)arthritis: Secondary | ICD-10-CM | POA: Diagnosis not present

## 2017-06-25 DIAGNOSIS — L97521 Non-pressure chronic ulcer of other part of left foot limited to breakdown of skin: Secondary | ICD-10-CM | POA: Diagnosis not present

## 2017-06-25 DIAGNOSIS — L97322 Non-pressure chronic ulcer of left ankle with fat layer exposed: Secondary | ICD-10-CM | POA: Diagnosis not present

## 2017-06-25 DIAGNOSIS — M86271 Subacute osteomyelitis, right ankle and foot: Secondary | ICD-10-CM | POA: Diagnosis not present

## 2017-06-25 DIAGNOSIS — M869 Osteomyelitis, unspecified: Secondary | ICD-10-CM | POA: Diagnosis not present

## 2017-06-25 DIAGNOSIS — L97813 Non-pressure chronic ulcer of other part of right lower leg with necrosis of muscle: Secondary | ICD-10-CM | POA: Diagnosis not present

## 2017-06-26 DIAGNOSIS — L97909 Non-pressure chronic ulcer of unspecified part of unspecified lower leg with unspecified severity: Secondary | ICD-10-CM | POA: Diagnosis not present

## 2017-06-26 DIAGNOSIS — M869 Osteomyelitis, unspecified: Secondary | ICD-10-CM | POA: Diagnosis not present

## 2017-06-26 DIAGNOSIS — M2550 Pain in unspecified joint: Secondary | ICD-10-CM | POA: Diagnosis not present

## 2017-06-26 DIAGNOSIS — I2510 Atherosclerotic heart disease of native coronary artery without angina pectoris: Secondary | ICD-10-CM | POA: Diagnosis not present

## 2017-06-26 DIAGNOSIS — D649 Anemia, unspecified: Secondary | ICD-10-CM | POA: Diagnosis not present

## 2017-06-26 DIAGNOSIS — E7800 Pure hypercholesterolemia, unspecified: Secondary | ICD-10-CM | POA: Diagnosis not present

## 2017-06-27 DIAGNOSIS — M869 Osteomyelitis, unspecified: Secondary | ICD-10-CM | POA: Diagnosis not present

## 2017-06-27 DIAGNOSIS — L97909 Non-pressure chronic ulcer of unspecified part of unspecified lower leg with unspecified severity: Secondary | ICD-10-CM | POA: Diagnosis not present

## 2017-06-28 DIAGNOSIS — I872 Venous insufficiency (chronic) (peripheral): Secondary | ICD-10-CM | POA: Diagnosis not present

## 2017-06-28 DIAGNOSIS — G629 Polyneuropathy, unspecified: Secondary | ICD-10-CM | POA: Diagnosis not present

## 2017-06-28 DIAGNOSIS — L97909 Non-pressure chronic ulcer of unspecified part of unspecified lower leg with unspecified severity: Secondary | ICD-10-CM | POA: Diagnosis not present

## 2017-06-28 DIAGNOSIS — L97812 Non-pressure chronic ulcer of other part of right lower leg with fat layer exposed: Secondary | ICD-10-CM | POA: Diagnosis not present

## 2017-06-28 DIAGNOSIS — Z886 Allergy status to analgesic agent status: Secondary | ICD-10-CM | POA: Diagnosis not present

## 2017-06-28 DIAGNOSIS — Z79899 Other long term (current) drug therapy: Secondary | ICD-10-CM | POA: Diagnosis not present

## 2017-06-28 DIAGNOSIS — L97529 Non-pressure chronic ulcer of other part of left foot with unspecified severity: Secondary | ICD-10-CM | POA: Diagnosis not present

## 2017-06-28 DIAGNOSIS — M869 Osteomyelitis, unspecified: Secondary | ICD-10-CM | POA: Diagnosis not present

## 2017-06-28 DIAGNOSIS — L97329 Non-pressure chronic ulcer of left ankle with unspecified severity: Secondary | ICD-10-CM | POA: Diagnosis not present

## 2017-06-28 DIAGNOSIS — I10 Essential (primary) hypertension: Secondary | ICD-10-CM | POA: Diagnosis not present

## 2017-06-28 DIAGNOSIS — M1990 Unspecified osteoarthritis, unspecified site: Secondary | ICD-10-CM | POA: Diagnosis not present

## 2017-06-28 DIAGNOSIS — Z880 Allergy status to penicillin: Secondary | ICD-10-CM | POA: Diagnosis not present

## 2017-06-28 DIAGNOSIS — Z87891 Personal history of nicotine dependence: Secondary | ICD-10-CM | POA: Diagnosis not present

## 2017-06-29 DIAGNOSIS — M869 Osteomyelitis, unspecified: Secondary | ICD-10-CM | POA: Diagnosis not present

## 2017-06-29 DIAGNOSIS — L97909 Non-pressure chronic ulcer of unspecified part of unspecified lower leg with unspecified severity: Secondary | ICD-10-CM | POA: Diagnosis not present

## 2017-06-30 DIAGNOSIS — M869 Osteomyelitis, unspecified: Secondary | ICD-10-CM | POA: Diagnosis not present

## 2017-06-30 DIAGNOSIS — L97909 Non-pressure chronic ulcer of unspecified part of unspecified lower leg with unspecified severity: Secondary | ICD-10-CM | POA: Diagnosis not present

## 2017-07-01 DIAGNOSIS — L97909 Non-pressure chronic ulcer of unspecified part of unspecified lower leg with unspecified severity: Secondary | ICD-10-CM | POA: Diagnosis not present

## 2017-07-01 DIAGNOSIS — M869 Osteomyelitis, unspecified: Secondary | ICD-10-CM | POA: Diagnosis not present

## 2017-07-02 DIAGNOSIS — L97322 Non-pressure chronic ulcer of left ankle with fat layer exposed: Secondary | ICD-10-CM | POA: Diagnosis not present

## 2017-07-02 DIAGNOSIS — M86271 Subacute osteomyelitis, right ankle and foot: Secondary | ICD-10-CM | POA: Diagnosis not present

## 2017-07-02 DIAGNOSIS — M869 Osteomyelitis, unspecified: Secondary | ICD-10-CM | POA: Diagnosis not present

## 2017-07-02 DIAGNOSIS — L97521 Non-pressure chronic ulcer of other part of left foot limited to breakdown of skin: Secondary | ICD-10-CM | POA: Diagnosis not present

## 2017-07-02 DIAGNOSIS — L97829 Non-pressure chronic ulcer of other part of left lower leg with unspecified severity: Secondary | ICD-10-CM | POA: Diagnosis not present

## 2017-07-02 DIAGNOSIS — M150 Primary generalized (osteo)arthritis: Secondary | ICD-10-CM | POA: Diagnosis not present

## 2017-07-02 DIAGNOSIS — L97813 Non-pressure chronic ulcer of other part of right lower leg with necrosis of muscle: Secondary | ICD-10-CM | POA: Diagnosis not present

## 2017-07-02 DIAGNOSIS — L97909 Non-pressure chronic ulcer of unspecified part of unspecified lower leg with unspecified severity: Secondary | ICD-10-CM | POA: Diagnosis not present

## 2017-07-03 DIAGNOSIS — L97909 Non-pressure chronic ulcer of unspecified part of unspecified lower leg with unspecified severity: Secondary | ICD-10-CM | POA: Diagnosis not present

## 2017-07-03 DIAGNOSIS — M869 Osteomyelitis, unspecified: Secondary | ICD-10-CM | POA: Diagnosis not present

## 2017-07-03 DIAGNOSIS — M2550 Pain in unspecified joint: Secondary | ICD-10-CM | POA: Diagnosis not present

## 2017-07-03 DIAGNOSIS — E7800 Pure hypercholesterolemia, unspecified: Secondary | ICD-10-CM | POA: Diagnosis not present

## 2017-07-03 DIAGNOSIS — D649 Anemia, unspecified: Secondary | ICD-10-CM | POA: Diagnosis not present

## 2017-07-03 DIAGNOSIS — I2510 Atherosclerotic heart disease of native coronary artery without angina pectoris: Secondary | ICD-10-CM | POA: Diagnosis not present

## 2017-07-04 DIAGNOSIS — L97322 Non-pressure chronic ulcer of left ankle with fat layer exposed: Secondary | ICD-10-CM | POA: Diagnosis not present

## 2017-07-04 DIAGNOSIS — M86271 Subacute osteomyelitis, right ankle and foot: Secondary | ICD-10-CM | POA: Diagnosis not present

## 2017-07-04 DIAGNOSIS — L97521 Non-pressure chronic ulcer of other part of left foot limited to breakdown of skin: Secondary | ICD-10-CM | POA: Diagnosis not present

## 2017-07-04 DIAGNOSIS — M869 Osteomyelitis, unspecified: Secondary | ICD-10-CM | POA: Diagnosis not present

## 2017-07-04 DIAGNOSIS — M150 Primary generalized (osteo)arthritis: Secondary | ICD-10-CM | POA: Diagnosis not present

## 2017-07-04 DIAGNOSIS — L97829 Non-pressure chronic ulcer of other part of left lower leg with unspecified severity: Secondary | ICD-10-CM | POA: Diagnosis not present

## 2017-07-04 DIAGNOSIS — L97909 Non-pressure chronic ulcer of unspecified part of unspecified lower leg with unspecified severity: Secondary | ICD-10-CM | POA: Diagnosis not present

## 2017-07-04 DIAGNOSIS — L97813 Non-pressure chronic ulcer of other part of right lower leg with necrosis of muscle: Secondary | ICD-10-CM | POA: Diagnosis not present

## 2017-07-05 DIAGNOSIS — L97909 Non-pressure chronic ulcer of unspecified part of unspecified lower leg with unspecified severity: Secondary | ICD-10-CM | POA: Diagnosis not present

## 2017-07-05 DIAGNOSIS — L97829 Non-pressure chronic ulcer of other part of left lower leg with unspecified severity: Secondary | ICD-10-CM | POA: Diagnosis not present

## 2017-07-05 DIAGNOSIS — L97521 Non-pressure chronic ulcer of other part of left foot limited to breakdown of skin: Secondary | ICD-10-CM | POA: Diagnosis not present

## 2017-07-05 DIAGNOSIS — M869 Osteomyelitis, unspecified: Secondary | ICD-10-CM | POA: Diagnosis not present

## 2017-07-05 DIAGNOSIS — M150 Primary generalized (osteo)arthritis: Secondary | ICD-10-CM | POA: Diagnosis not present

## 2017-07-05 DIAGNOSIS — L97813 Non-pressure chronic ulcer of other part of right lower leg with necrosis of muscle: Secondary | ICD-10-CM | POA: Diagnosis not present

## 2017-07-05 DIAGNOSIS — M86271 Subacute osteomyelitis, right ankle and foot: Secondary | ICD-10-CM | POA: Diagnosis not present

## 2017-07-05 DIAGNOSIS — L97322 Non-pressure chronic ulcer of left ankle with fat layer exposed: Secondary | ICD-10-CM | POA: Diagnosis not present

## 2017-07-06 DIAGNOSIS — L97909 Non-pressure chronic ulcer of unspecified part of unspecified lower leg with unspecified severity: Secondary | ICD-10-CM | POA: Diagnosis not present

## 2017-07-06 DIAGNOSIS — M869 Osteomyelitis, unspecified: Secondary | ICD-10-CM | POA: Diagnosis not present

## 2017-07-07 DIAGNOSIS — L97909 Non-pressure chronic ulcer of unspecified part of unspecified lower leg with unspecified severity: Secondary | ICD-10-CM | POA: Diagnosis not present

## 2017-07-07 DIAGNOSIS — M869 Osteomyelitis, unspecified: Secondary | ICD-10-CM | POA: Diagnosis not present

## 2017-07-08 DIAGNOSIS — L97909 Non-pressure chronic ulcer of unspecified part of unspecified lower leg with unspecified severity: Secondary | ICD-10-CM | POA: Diagnosis not present

## 2017-07-08 DIAGNOSIS — M869 Osteomyelitis, unspecified: Secondary | ICD-10-CM | POA: Diagnosis not present

## 2017-07-09 DIAGNOSIS — L97522 Non-pressure chronic ulcer of other part of left foot with fat layer exposed: Secondary | ICD-10-CM | POA: Diagnosis not present

## 2017-07-09 DIAGNOSIS — Z886 Allergy status to analgesic agent status: Secondary | ICD-10-CM | POA: Diagnosis not present

## 2017-07-09 DIAGNOSIS — L97812 Non-pressure chronic ulcer of other part of right lower leg with fat layer exposed: Secondary | ICD-10-CM | POA: Diagnosis not present

## 2017-07-09 DIAGNOSIS — I10 Essential (primary) hypertension: Secondary | ICD-10-CM | POA: Diagnosis not present

## 2017-07-09 DIAGNOSIS — G629 Polyneuropathy, unspecified: Secondary | ICD-10-CM | POA: Diagnosis not present

## 2017-07-09 DIAGNOSIS — L97909 Non-pressure chronic ulcer of unspecified part of unspecified lower leg with unspecified severity: Secondary | ICD-10-CM | POA: Diagnosis not present

## 2017-07-09 DIAGNOSIS — M1990 Unspecified osteoarthritis, unspecified site: Secondary | ICD-10-CM | POA: Diagnosis not present

## 2017-07-09 DIAGNOSIS — M869 Osteomyelitis, unspecified: Secondary | ICD-10-CM | POA: Diagnosis not present

## 2017-07-09 DIAGNOSIS — L97322 Non-pressure chronic ulcer of left ankle with fat layer exposed: Secondary | ICD-10-CM | POA: Diagnosis not present

## 2017-07-09 DIAGNOSIS — R21 Rash and other nonspecific skin eruption: Secondary | ICD-10-CM | POA: Diagnosis not present

## 2017-07-09 DIAGNOSIS — Z880 Allergy status to penicillin: Secondary | ICD-10-CM | POA: Diagnosis not present

## 2017-07-09 DIAGNOSIS — Z79899 Other long term (current) drug therapy: Secondary | ICD-10-CM | POA: Diagnosis not present

## 2017-07-09 DIAGNOSIS — I872 Venous insufficiency (chronic) (peripheral): Secondary | ICD-10-CM | POA: Diagnosis not present

## 2017-07-10 DIAGNOSIS — I10 Essential (primary) hypertension: Secondary | ICD-10-CM | POA: Diagnosis not present

## 2017-07-10 DIAGNOSIS — M170 Bilateral primary osteoarthritis of knee: Secondary | ICD-10-CM | POA: Diagnosis not present

## 2017-07-10 DIAGNOSIS — G4709 Other insomnia: Secondary | ICD-10-CM | POA: Diagnosis not present

## 2017-07-10 DIAGNOSIS — L97909 Non-pressure chronic ulcer of unspecified part of unspecified lower leg with unspecified severity: Secondary | ICD-10-CM | POA: Diagnosis not present

## 2017-07-10 DIAGNOSIS — M869 Osteomyelitis, unspecified: Secondary | ICD-10-CM | POA: Diagnosis not present

## 2017-07-12 DIAGNOSIS — L97322 Non-pressure chronic ulcer of left ankle with fat layer exposed: Secondary | ICD-10-CM | POA: Diagnosis not present

## 2017-07-12 DIAGNOSIS — M86271 Subacute osteomyelitis, right ankle and foot: Secondary | ICD-10-CM | POA: Diagnosis not present

## 2017-07-12 DIAGNOSIS — M150 Primary generalized (osteo)arthritis: Secondary | ICD-10-CM | POA: Diagnosis not present

## 2017-07-12 DIAGNOSIS — L97829 Non-pressure chronic ulcer of other part of left lower leg with unspecified severity: Secondary | ICD-10-CM | POA: Diagnosis not present

## 2017-07-12 DIAGNOSIS — L97813 Non-pressure chronic ulcer of other part of right lower leg with necrosis of muscle: Secondary | ICD-10-CM | POA: Diagnosis not present

## 2017-07-12 DIAGNOSIS — L97521 Non-pressure chronic ulcer of other part of left foot limited to breakdown of skin: Secondary | ICD-10-CM | POA: Diagnosis not present

## 2017-07-16 DIAGNOSIS — I872 Venous insufficiency (chronic) (peripheral): Secondary | ICD-10-CM | POA: Diagnosis not present

## 2017-07-16 DIAGNOSIS — L97322 Non-pressure chronic ulcer of left ankle with fat layer exposed: Secondary | ICD-10-CM | POA: Diagnosis not present

## 2017-07-16 DIAGNOSIS — Z886 Allergy status to analgesic agent status: Secondary | ICD-10-CM | POA: Diagnosis not present

## 2017-07-16 DIAGNOSIS — Z79899 Other long term (current) drug therapy: Secondary | ICD-10-CM | POA: Diagnosis not present

## 2017-07-16 DIAGNOSIS — L97812 Non-pressure chronic ulcer of other part of right lower leg with fat layer exposed: Secondary | ICD-10-CM | POA: Diagnosis not present

## 2017-07-16 DIAGNOSIS — M1990 Unspecified osteoarthritis, unspecified site: Secondary | ICD-10-CM | POA: Diagnosis not present

## 2017-07-16 DIAGNOSIS — G4709 Other insomnia: Secondary | ICD-10-CM | POA: Diagnosis not present

## 2017-07-16 DIAGNOSIS — M170 Bilateral primary osteoarthritis of knee: Secondary | ICD-10-CM | POA: Diagnosis not present

## 2017-07-16 DIAGNOSIS — G629 Polyneuropathy, unspecified: Secondary | ICD-10-CM | POA: Diagnosis not present

## 2017-07-16 DIAGNOSIS — Z833 Family history of diabetes mellitus: Secondary | ICD-10-CM | POA: Diagnosis not present

## 2017-07-16 DIAGNOSIS — L97522 Non-pressure chronic ulcer of other part of left foot with fat layer exposed: Secondary | ICD-10-CM | POA: Diagnosis not present

## 2017-07-16 DIAGNOSIS — Z87891 Personal history of nicotine dependence: Secondary | ICD-10-CM | POA: Diagnosis not present

## 2017-07-16 DIAGNOSIS — Z880 Allergy status to penicillin: Secondary | ICD-10-CM | POA: Diagnosis not present

## 2017-07-16 DIAGNOSIS — I10 Essential (primary) hypertension: Secondary | ICD-10-CM | POA: Diagnosis not present

## 2017-07-19 DIAGNOSIS — L97813 Non-pressure chronic ulcer of other part of right lower leg with necrosis of muscle: Secondary | ICD-10-CM | POA: Diagnosis not present

## 2017-07-19 DIAGNOSIS — L97322 Non-pressure chronic ulcer of left ankle with fat layer exposed: Secondary | ICD-10-CM | POA: Diagnosis not present

## 2017-07-19 DIAGNOSIS — M150 Primary generalized (osteo)arthritis: Secondary | ICD-10-CM | POA: Diagnosis not present

## 2017-07-19 DIAGNOSIS — L97521 Non-pressure chronic ulcer of other part of left foot limited to breakdown of skin: Secondary | ICD-10-CM | POA: Diagnosis not present

## 2017-07-19 DIAGNOSIS — I10 Essential (primary) hypertension: Secondary | ICD-10-CM | POA: Diagnosis not present

## 2017-07-19 DIAGNOSIS — L97829 Non-pressure chronic ulcer of other part of left lower leg with unspecified severity: Secondary | ICD-10-CM | POA: Diagnosis not present

## 2017-07-19 DIAGNOSIS — M170 Bilateral primary osteoarthritis of knee: Secondary | ICD-10-CM | POA: Diagnosis not present

## 2017-07-19 DIAGNOSIS — M86271 Subacute osteomyelitis, right ankle and foot: Secondary | ICD-10-CM | POA: Diagnosis not present

## 2017-07-23 DIAGNOSIS — Z87891 Personal history of nicotine dependence: Secondary | ICD-10-CM | POA: Diagnosis not present

## 2017-07-23 DIAGNOSIS — G629 Polyneuropathy, unspecified: Secondary | ICD-10-CM | POA: Diagnosis not present

## 2017-07-23 DIAGNOSIS — I872 Venous insufficiency (chronic) (peripheral): Secondary | ICD-10-CM | POA: Diagnosis not present

## 2017-07-23 DIAGNOSIS — Z886 Allergy status to analgesic agent status: Secondary | ICD-10-CM | POA: Diagnosis not present

## 2017-07-23 DIAGNOSIS — I10 Essential (primary) hypertension: Secondary | ICD-10-CM | POA: Diagnosis not present

## 2017-07-23 DIAGNOSIS — M1990 Unspecified osteoarthritis, unspecified site: Secondary | ICD-10-CM | POA: Diagnosis not present

## 2017-07-23 DIAGNOSIS — Z880 Allergy status to penicillin: Secondary | ICD-10-CM | POA: Diagnosis not present

## 2017-07-23 DIAGNOSIS — L97812 Non-pressure chronic ulcer of other part of right lower leg with fat layer exposed: Secondary | ICD-10-CM | POA: Diagnosis not present

## 2017-07-23 DIAGNOSIS — L97322 Non-pressure chronic ulcer of left ankle with fat layer exposed: Secondary | ICD-10-CM | POA: Diagnosis not present

## 2017-07-23 DIAGNOSIS — L97522 Non-pressure chronic ulcer of other part of left foot with fat layer exposed: Secondary | ICD-10-CM | POA: Diagnosis not present

## 2017-07-24 DIAGNOSIS — M86371 Chronic multifocal osteomyelitis, right ankle and foot: Secondary | ICD-10-CM | POA: Diagnosis not present

## 2017-07-24 DIAGNOSIS — M86361 Chronic multifocal osteomyelitis, right tibia and fibula: Secondary | ICD-10-CM | POA: Diagnosis not present

## 2017-07-26 DIAGNOSIS — M86271 Subacute osteomyelitis, right ankle and foot: Secondary | ICD-10-CM | POA: Diagnosis not present

## 2017-07-26 DIAGNOSIS — L97322 Non-pressure chronic ulcer of left ankle with fat layer exposed: Secondary | ICD-10-CM | POA: Diagnosis not present

## 2017-07-26 DIAGNOSIS — M150 Primary generalized (osteo)arthritis: Secondary | ICD-10-CM | POA: Diagnosis not present

## 2017-07-26 DIAGNOSIS — L97829 Non-pressure chronic ulcer of other part of left lower leg with unspecified severity: Secondary | ICD-10-CM | POA: Diagnosis not present

## 2017-07-26 DIAGNOSIS — L97521 Non-pressure chronic ulcer of other part of left foot limited to breakdown of skin: Secondary | ICD-10-CM | POA: Diagnosis not present

## 2017-07-26 DIAGNOSIS — L97813 Non-pressure chronic ulcer of other part of right lower leg with necrosis of muscle: Secondary | ICD-10-CM | POA: Diagnosis not present

## 2017-07-30 DIAGNOSIS — Z87891 Personal history of nicotine dependence: Secondary | ICD-10-CM | POA: Diagnosis not present

## 2017-07-30 DIAGNOSIS — Z886 Allergy status to analgesic agent status: Secondary | ICD-10-CM | POA: Diagnosis not present

## 2017-07-30 DIAGNOSIS — I872 Venous insufficiency (chronic) (peripheral): Secondary | ICD-10-CM | POA: Diagnosis not present

## 2017-07-30 DIAGNOSIS — I10 Essential (primary) hypertension: Secondary | ICD-10-CM | POA: Diagnosis not present

## 2017-07-30 DIAGNOSIS — L97812 Non-pressure chronic ulcer of other part of right lower leg with fat layer exposed: Secondary | ICD-10-CM | POA: Diagnosis not present

## 2017-07-30 DIAGNOSIS — L97522 Non-pressure chronic ulcer of other part of left foot with fat layer exposed: Secondary | ICD-10-CM | POA: Diagnosis not present

## 2017-07-30 DIAGNOSIS — G629 Polyneuropathy, unspecified: Secondary | ICD-10-CM | POA: Diagnosis not present

## 2017-07-30 DIAGNOSIS — Z880 Allergy status to penicillin: Secondary | ICD-10-CM | POA: Diagnosis not present

## 2017-07-30 DIAGNOSIS — M1990 Unspecified osteoarthritis, unspecified site: Secondary | ICD-10-CM | POA: Diagnosis not present

## 2017-07-30 DIAGNOSIS — L84 Corns and callosities: Secondary | ICD-10-CM | POA: Diagnosis not present

## 2017-07-30 DIAGNOSIS — L97322 Non-pressure chronic ulcer of left ankle with fat layer exposed: Secondary | ICD-10-CM | POA: Diagnosis not present

## 2017-07-30 DIAGNOSIS — M170 Bilateral primary osteoarthritis of knee: Secondary | ICD-10-CM | POA: Diagnosis not present

## 2017-08-02 DIAGNOSIS — M150 Primary generalized (osteo)arthritis: Secondary | ICD-10-CM | POA: Diagnosis not present

## 2017-08-02 DIAGNOSIS — L97322 Non-pressure chronic ulcer of left ankle with fat layer exposed: Secondary | ICD-10-CM | POA: Diagnosis not present

## 2017-08-02 DIAGNOSIS — M86271 Subacute osteomyelitis, right ankle and foot: Secondary | ICD-10-CM | POA: Diagnosis not present

## 2017-08-02 DIAGNOSIS — L97521 Non-pressure chronic ulcer of other part of left foot limited to breakdown of skin: Secondary | ICD-10-CM | POA: Diagnosis not present

## 2017-08-02 DIAGNOSIS — L97829 Non-pressure chronic ulcer of other part of left lower leg with unspecified severity: Secondary | ICD-10-CM | POA: Diagnosis not present

## 2017-08-02 DIAGNOSIS — L97813 Non-pressure chronic ulcer of other part of right lower leg with necrosis of muscle: Secondary | ICD-10-CM | POA: Diagnosis not present

## 2017-08-06 DIAGNOSIS — M1990 Unspecified osteoarthritis, unspecified site: Secondary | ICD-10-CM | POA: Diagnosis not present

## 2017-08-06 DIAGNOSIS — Z79899 Other long term (current) drug therapy: Secondary | ICD-10-CM | POA: Diagnosis not present

## 2017-08-06 DIAGNOSIS — Z886 Allergy status to analgesic agent status: Secondary | ICD-10-CM | POA: Diagnosis not present

## 2017-08-06 DIAGNOSIS — G629 Polyneuropathy, unspecified: Secondary | ICD-10-CM | POA: Diagnosis not present

## 2017-08-06 DIAGNOSIS — Z87891 Personal history of nicotine dependence: Secondary | ICD-10-CM | POA: Diagnosis not present

## 2017-08-06 DIAGNOSIS — L97322 Non-pressure chronic ulcer of left ankle with fat layer exposed: Secondary | ICD-10-CM | POA: Diagnosis not present

## 2017-08-06 DIAGNOSIS — Z880 Allergy status to penicillin: Secondary | ICD-10-CM | POA: Diagnosis not present

## 2017-08-06 DIAGNOSIS — L97812 Non-pressure chronic ulcer of other part of right lower leg with fat layer exposed: Secondary | ICD-10-CM | POA: Diagnosis not present

## 2017-08-06 DIAGNOSIS — I872 Venous insufficiency (chronic) (peripheral): Secondary | ICD-10-CM | POA: Diagnosis not present

## 2017-08-06 DIAGNOSIS — L97312 Non-pressure chronic ulcer of right ankle with fat layer exposed: Secondary | ICD-10-CM | POA: Diagnosis not present

## 2017-08-06 DIAGNOSIS — L97522 Non-pressure chronic ulcer of other part of left foot with fat layer exposed: Secondary | ICD-10-CM | POA: Diagnosis not present

## 2017-08-06 DIAGNOSIS — I10 Essential (primary) hypertension: Secondary | ICD-10-CM | POA: Diagnosis not present

## 2017-08-09 DIAGNOSIS — L97521 Non-pressure chronic ulcer of other part of left foot limited to breakdown of skin: Secondary | ICD-10-CM | POA: Diagnosis not present

## 2017-08-09 DIAGNOSIS — L97829 Non-pressure chronic ulcer of other part of left lower leg with unspecified severity: Secondary | ICD-10-CM | POA: Diagnosis not present

## 2017-08-09 DIAGNOSIS — M86271 Subacute osteomyelitis, right ankle and foot: Secondary | ICD-10-CM | POA: Diagnosis not present

## 2017-08-09 DIAGNOSIS — L97322 Non-pressure chronic ulcer of left ankle with fat layer exposed: Secondary | ICD-10-CM | POA: Diagnosis not present

## 2017-08-09 DIAGNOSIS — L97813 Non-pressure chronic ulcer of other part of right lower leg with necrosis of muscle: Secondary | ICD-10-CM | POA: Diagnosis not present

## 2017-08-09 DIAGNOSIS — M150 Primary generalized (osteo)arthritis: Secondary | ICD-10-CM | POA: Diagnosis not present

## 2017-08-13 DIAGNOSIS — Z79899 Other long term (current) drug therapy: Secondary | ICD-10-CM | POA: Diagnosis not present

## 2017-08-13 DIAGNOSIS — M86371 Chronic multifocal osteomyelitis, right ankle and foot: Secondary | ICD-10-CM | POA: Diagnosis not present

## 2017-08-13 DIAGNOSIS — L97813 Non-pressure chronic ulcer of other part of right lower leg with necrosis of muscle: Secondary | ICD-10-CM | POA: Diagnosis not present

## 2017-08-13 DIAGNOSIS — M86361 Chronic multifocal osteomyelitis, right tibia and fibula: Secondary | ICD-10-CM | POA: Diagnosis not present

## 2017-08-13 DIAGNOSIS — I872 Venous insufficiency (chronic) (peripheral): Secondary | ICD-10-CM | POA: Diagnosis not present

## 2017-08-13 DIAGNOSIS — G629 Polyneuropathy, unspecified: Secondary | ICD-10-CM | POA: Diagnosis not present

## 2017-08-13 DIAGNOSIS — L97312 Non-pressure chronic ulcer of right ankle with fat layer exposed: Secondary | ICD-10-CM | POA: Diagnosis not present

## 2017-08-13 DIAGNOSIS — M86671 Other chronic osteomyelitis, right ankle and foot: Secondary | ICD-10-CM | POA: Diagnosis not present

## 2017-08-13 DIAGNOSIS — H9203 Otalgia, bilateral: Secondary | ICD-10-CM | POA: Diagnosis not present

## 2017-08-13 DIAGNOSIS — Z886 Allergy status to analgesic agent status: Secondary | ICD-10-CM | POA: Diagnosis not present

## 2017-08-13 DIAGNOSIS — L97522 Non-pressure chronic ulcer of other part of left foot with fat layer exposed: Secondary | ICD-10-CM | POA: Diagnosis not present

## 2017-08-13 DIAGNOSIS — L97812 Non-pressure chronic ulcer of other part of right lower leg with fat layer exposed: Secondary | ICD-10-CM | POA: Diagnosis not present

## 2017-08-13 DIAGNOSIS — L97521 Non-pressure chronic ulcer of other part of left foot limited to breakdown of skin: Secondary | ICD-10-CM | POA: Diagnosis not present

## 2017-08-13 DIAGNOSIS — Z880 Allergy status to penicillin: Secondary | ICD-10-CM | POA: Diagnosis not present

## 2017-08-13 DIAGNOSIS — I10 Essential (primary) hypertension: Secondary | ICD-10-CM | POA: Diagnosis not present

## 2017-08-13 DIAGNOSIS — M2550 Pain in unspecified joint: Secondary | ICD-10-CM | POA: Diagnosis not present

## 2017-08-13 DIAGNOSIS — Z87891 Personal history of nicotine dependence: Secondary | ICD-10-CM | POA: Diagnosis not present

## 2017-08-13 DIAGNOSIS — L97322 Non-pressure chronic ulcer of left ankle with fat layer exposed: Secondary | ICD-10-CM | POA: Diagnosis not present

## 2017-08-13 DIAGNOSIS — L97829 Non-pressure chronic ulcer of other part of left lower leg with unspecified severity: Secondary | ICD-10-CM | POA: Diagnosis not present

## 2017-08-13 DIAGNOSIS — M1990 Unspecified osteoarthritis, unspecified site: Secondary | ICD-10-CM | POA: Diagnosis not present

## 2017-08-13 DIAGNOSIS — E559 Vitamin D deficiency, unspecified: Secondary | ICD-10-CM | POA: Diagnosis not present

## 2017-08-13 DIAGNOSIS — M86171 Other acute osteomyelitis, right ankle and foot: Secondary | ICD-10-CM | POA: Diagnosis not present

## 2017-08-15 DIAGNOSIS — M869 Osteomyelitis, unspecified: Secondary | ICD-10-CM | POA: Diagnosis not present

## 2017-08-16 DIAGNOSIS — L97322 Non-pressure chronic ulcer of left ankle with fat layer exposed: Secondary | ICD-10-CM | POA: Diagnosis not present

## 2017-08-16 DIAGNOSIS — L97829 Non-pressure chronic ulcer of other part of left lower leg with unspecified severity: Secondary | ICD-10-CM | POA: Diagnosis not present

## 2017-08-16 DIAGNOSIS — L97521 Non-pressure chronic ulcer of other part of left foot limited to breakdown of skin: Secondary | ICD-10-CM | POA: Diagnosis not present

## 2017-08-16 DIAGNOSIS — M150 Primary generalized (osteo)arthritis: Secondary | ICD-10-CM | POA: Diagnosis not present

## 2017-08-16 DIAGNOSIS — L97813 Non-pressure chronic ulcer of other part of right lower leg with necrosis of muscle: Secondary | ICD-10-CM | POA: Diagnosis not present

## 2017-08-16 DIAGNOSIS — M86271 Subacute osteomyelitis, right ankle and foot: Secondary | ICD-10-CM | POA: Diagnosis not present

## 2017-08-17 DIAGNOSIS — L97813 Non-pressure chronic ulcer of other part of right lower leg with necrosis of muscle: Secondary | ICD-10-CM | POA: Diagnosis not present

## 2017-08-17 DIAGNOSIS — M86271 Subacute osteomyelitis, right ankle and foot: Secondary | ICD-10-CM | POA: Diagnosis not present

## 2017-08-17 DIAGNOSIS — L97322 Non-pressure chronic ulcer of left ankle with fat layer exposed: Secondary | ICD-10-CM | POA: Diagnosis not present

## 2017-08-17 DIAGNOSIS — L97521 Non-pressure chronic ulcer of other part of left foot limited to breakdown of skin: Secondary | ICD-10-CM | POA: Diagnosis not present

## 2017-08-17 DIAGNOSIS — M150 Primary generalized (osteo)arthritis: Secondary | ICD-10-CM | POA: Diagnosis not present

## 2017-08-17 DIAGNOSIS — L97829 Non-pressure chronic ulcer of other part of left lower leg with unspecified severity: Secondary | ICD-10-CM | POA: Diagnosis not present

## 2017-08-20 DIAGNOSIS — Z886 Allergy status to analgesic agent status: Secondary | ICD-10-CM | POA: Diagnosis not present

## 2017-08-20 DIAGNOSIS — I872 Venous insufficiency (chronic) (peripheral): Secondary | ICD-10-CM | POA: Diagnosis not present

## 2017-08-20 DIAGNOSIS — L97322 Non-pressure chronic ulcer of left ankle with fat layer exposed: Secondary | ICD-10-CM | POA: Diagnosis not present

## 2017-08-20 DIAGNOSIS — M1990 Unspecified osteoarthritis, unspecified site: Secondary | ICD-10-CM | POA: Diagnosis not present

## 2017-08-20 DIAGNOSIS — L97522 Non-pressure chronic ulcer of other part of left foot with fat layer exposed: Secondary | ICD-10-CM | POA: Diagnosis not present

## 2017-08-20 DIAGNOSIS — L97812 Non-pressure chronic ulcer of other part of right lower leg with fat layer exposed: Secondary | ICD-10-CM | POA: Diagnosis not present

## 2017-08-20 DIAGNOSIS — Z880 Allergy status to penicillin: Secondary | ICD-10-CM | POA: Diagnosis not present

## 2017-08-20 DIAGNOSIS — Z87891 Personal history of nicotine dependence: Secondary | ICD-10-CM | POA: Diagnosis not present

## 2017-08-20 DIAGNOSIS — E1140 Type 2 diabetes mellitus with diabetic neuropathy, unspecified: Secondary | ICD-10-CM | POA: Diagnosis not present

## 2017-08-20 DIAGNOSIS — I10 Essential (primary) hypertension: Secondary | ICD-10-CM | POA: Diagnosis not present

## 2017-08-21 DIAGNOSIS — B351 Tinea unguium: Secondary | ICD-10-CM | POA: Diagnosis not present

## 2017-08-21 DIAGNOSIS — L600 Ingrowing nail: Secondary | ICD-10-CM | POA: Diagnosis not present

## 2017-08-21 DIAGNOSIS — I739 Peripheral vascular disease, unspecified: Secondary | ICD-10-CM | POA: Diagnosis not present

## 2017-08-21 DIAGNOSIS — M79675 Pain in left toe(s): Secondary | ICD-10-CM | POA: Diagnosis not present

## 2017-08-21 DIAGNOSIS — L602 Onychogryphosis: Secondary | ICD-10-CM | POA: Diagnosis not present

## 2017-08-21 DIAGNOSIS — M79674 Pain in right toe(s): Secondary | ICD-10-CM | POA: Diagnosis not present

## 2017-08-21 DIAGNOSIS — L84 Corns and callosities: Secondary | ICD-10-CM | POA: Diagnosis not present

## 2017-08-23 DIAGNOSIS — M86271 Subacute osteomyelitis, right ankle and foot: Secondary | ICD-10-CM | POA: Diagnosis not present

## 2017-08-23 DIAGNOSIS — L97521 Non-pressure chronic ulcer of other part of left foot limited to breakdown of skin: Secondary | ICD-10-CM | POA: Diagnosis not present

## 2017-08-23 DIAGNOSIS — L97829 Non-pressure chronic ulcer of other part of left lower leg with unspecified severity: Secondary | ICD-10-CM | POA: Diagnosis not present

## 2017-08-23 DIAGNOSIS — L97813 Non-pressure chronic ulcer of other part of right lower leg with necrosis of muscle: Secondary | ICD-10-CM | POA: Diagnosis not present

## 2017-08-23 DIAGNOSIS — M150 Primary generalized (osteo)arthritis: Secondary | ICD-10-CM | POA: Diagnosis not present

## 2017-08-23 DIAGNOSIS — L97322 Non-pressure chronic ulcer of left ankle with fat layer exposed: Secondary | ICD-10-CM | POA: Diagnosis not present

## 2017-08-27 DIAGNOSIS — L97522 Non-pressure chronic ulcer of other part of left foot with fat layer exposed: Secondary | ICD-10-CM | POA: Diagnosis not present

## 2017-08-27 DIAGNOSIS — M1990 Unspecified osteoarthritis, unspecified site: Secondary | ICD-10-CM | POA: Diagnosis not present

## 2017-08-27 DIAGNOSIS — I10 Essential (primary) hypertension: Secondary | ICD-10-CM | POA: Diagnosis not present

## 2017-08-27 DIAGNOSIS — L97812 Non-pressure chronic ulcer of other part of right lower leg with fat layer exposed: Secondary | ICD-10-CM | POA: Diagnosis not present

## 2017-08-27 DIAGNOSIS — Z880 Allergy status to penicillin: Secondary | ICD-10-CM | POA: Diagnosis not present

## 2017-08-27 DIAGNOSIS — I872 Venous insufficiency (chronic) (peripheral): Secondary | ICD-10-CM | POA: Diagnosis not present

## 2017-08-27 DIAGNOSIS — L97312 Non-pressure chronic ulcer of right ankle with fat layer exposed: Secondary | ICD-10-CM | POA: Diagnosis not present

## 2017-08-27 DIAGNOSIS — G629 Polyneuropathy, unspecified: Secondary | ICD-10-CM | POA: Diagnosis not present

## 2017-08-27 DIAGNOSIS — Z886 Allergy status to analgesic agent status: Secondary | ICD-10-CM | POA: Diagnosis not present

## 2017-08-27 DIAGNOSIS — L97322 Non-pressure chronic ulcer of left ankle with fat layer exposed: Secondary | ICD-10-CM | POA: Diagnosis not present

## 2017-08-27 DIAGNOSIS — Z87891 Personal history of nicotine dependence: Secondary | ICD-10-CM | POA: Diagnosis not present

## 2017-08-29 DIAGNOSIS — I10 Essential (primary) hypertension: Secondary | ICD-10-CM | POA: Diagnosis not present

## 2017-08-29 DIAGNOSIS — M170 Bilateral primary osteoarthritis of knee: Secondary | ICD-10-CM | POA: Diagnosis not present

## 2017-08-30 DIAGNOSIS — M150 Primary generalized (osteo)arthritis: Secondary | ICD-10-CM | POA: Diagnosis not present

## 2017-08-30 DIAGNOSIS — M86271 Subacute osteomyelitis, right ankle and foot: Secondary | ICD-10-CM | POA: Diagnosis not present

## 2017-08-30 DIAGNOSIS — L97521 Non-pressure chronic ulcer of other part of left foot limited to breakdown of skin: Secondary | ICD-10-CM | POA: Diagnosis not present

## 2017-08-30 DIAGNOSIS — L97813 Non-pressure chronic ulcer of other part of right lower leg with necrosis of muscle: Secondary | ICD-10-CM | POA: Diagnosis not present

## 2017-08-30 DIAGNOSIS — L97829 Non-pressure chronic ulcer of other part of left lower leg with unspecified severity: Secondary | ICD-10-CM | POA: Diagnosis not present

## 2017-08-30 DIAGNOSIS — L97322 Non-pressure chronic ulcer of left ankle with fat layer exposed: Secondary | ICD-10-CM | POA: Diagnosis not present

## 2017-09-02 DIAGNOSIS — M86271 Subacute osteomyelitis, right ankle and foot: Secondary | ICD-10-CM | POA: Diagnosis not present

## 2017-09-02 DIAGNOSIS — L97322 Non-pressure chronic ulcer of left ankle with fat layer exposed: Secondary | ICD-10-CM | POA: Diagnosis not present

## 2017-09-02 DIAGNOSIS — M150 Primary generalized (osteo)arthritis: Secondary | ICD-10-CM | POA: Diagnosis not present

## 2017-09-02 DIAGNOSIS — L97521 Non-pressure chronic ulcer of other part of left foot limited to breakdown of skin: Secondary | ICD-10-CM | POA: Diagnosis not present

## 2017-09-02 DIAGNOSIS — L97829 Non-pressure chronic ulcer of other part of left lower leg with unspecified severity: Secondary | ICD-10-CM | POA: Diagnosis not present

## 2017-09-02 DIAGNOSIS — L97813 Non-pressure chronic ulcer of other part of right lower leg with necrosis of muscle: Secondary | ICD-10-CM | POA: Diagnosis not present

## 2017-09-03 DIAGNOSIS — L97312 Non-pressure chronic ulcer of right ankle with fat layer exposed: Secondary | ICD-10-CM | POA: Diagnosis not present

## 2017-09-03 DIAGNOSIS — L97522 Non-pressure chronic ulcer of other part of left foot with fat layer exposed: Secondary | ICD-10-CM | POA: Diagnosis not present

## 2017-09-03 DIAGNOSIS — I872 Venous insufficiency (chronic) (peripheral): Secondary | ICD-10-CM | POA: Diagnosis not present

## 2017-09-03 DIAGNOSIS — L97812 Non-pressure chronic ulcer of other part of right lower leg with fat layer exposed: Secondary | ICD-10-CM | POA: Diagnosis not present

## 2017-09-03 DIAGNOSIS — I10 Essential (primary) hypertension: Secondary | ICD-10-CM | POA: Diagnosis not present

## 2017-09-03 DIAGNOSIS — Z886 Allergy status to analgesic agent status: Secondary | ICD-10-CM | POA: Diagnosis not present

## 2017-09-03 DIAGNOSIS — Z880 Allergy status to penicillin: Secondary | ICD-10-CM | POA: Diagnosis not present

## 2017-09-03 DIAGNOSIS — Z87891 Personal history of nicotine dependence: Secondary | ICD-10-CM | POA: Diagnosis not present

## 2017-09-03 DIAGNOSIS — L97322 Non-pressure chronic ulcer of left ankle with fat layer exposed: Secondary | ICD-10-CM | POA: Diagnosis not present

## 2017-09-03 DIAGNOSIS — M1990 Unspecified osteoarthritis, unspecified site: Secondary | ICD-10-CM | POA: Diagnosis not present

## 2017-09-03 DIAGNOSIS — G629 Polyneuropathy, unspecified: Secondary | ICD-10-CM | POA: Diagnosis not present

## 2017-09-05 DIAGNOSIS — I872 Venous insufficiency (chronic) (peripheral): Secondary | ICD-10-CM | POA: Diagnosis not present

## 2017-09-05 DIAGNOSIS — M869 Osteomyelitis, unspecified: Secondary | ICD-10-CM | POA: Diagnosis not present

## 2017-09-06 DIAGNOSIS — L97829 Non-pressure chronic ulcer of other part of left lower leg with unspecified severity: Secondary | ICD-10-CM | POA: Diagnosis not present

## 2017-09-06 DIAGNOSIS — M86271 Subacute osteomyelitis, right ankle and foot: Secondary | ICD-10-CM | POA: Diagnosis not present

## 2017-09-06 DIAGNOSIS — L97813 Non-pressure chronic ulcer of other part of right lower leg with necrosis of muscle: Secondary | ICD-10-CM | POA: Diagnosis not present

## 2017-09-06 DIAGNOSIS — M150 Primary generalized (osteo)arthritis: Secondary | ICD-10-CM | POA: Diagnosis not present

## 2017-09-06 DIAGNOSIS — L97521 Non-pressure chronic ulcer of other part of left foot limited to breakdown of skin: Secondary | ICD-10-CM | POA: Diagnosis not present

## 2017-09-06 DIAGNOSIS — L97322 Non-pressure chronic ulcer of left ankle with fat layer exposed: Secondary | ICD-10-CM | POA: Diagnosis not present

## 2017-09-10 DIAGNOSIS — L97812 Non-pressure chronic ulcer of other part of right lower leg with fat layer exposed: Secondary | ICD-10-CM | POA: Diagnosis not present

## 2017-09-10 DIAGNOSIS — L97522 Non-pressure chronic ulcer of other part of left foot with fat layer exposed: Secondary | ICD-10-CM | POA: Diagnosis not present

## 2017-09-10 DIAGNOSIS — M1990 Unspecified osteoarthritis, unspecified site: Secondary | ICD-10-CM | POA: Diagnosis not present

## 2017-09-10 DIAGNOSIS — G629 Polyneuropathy, unspecified: Secondary | ICD-10-CM | POA: Diagnosis not present

## 2017-09-10 DIAGNOSIS — Z880 Allergy status to penicillin: Secondary | ICD-10-CM | POA: Diagnosis not present

## 2017-09-10 DIAGNOSIS — Z886 Allergy status to analgesic agent status: Secondary | ICD-10-CM | POA: Diagnosis not present

## 2017-09-10 DIAGNOSIS — I10 Essential (primary) hypertension: Secondary | ICD-10-CM | POA: Diagnosis not present

## 2017-09-10 DIAGNOSIS — Z87891 Personal history of nicotine dependence: Secondary | ICD-10-CM | POA: Diagnosis not present

## 2017-09-10 DIAGNOSIS — I872 Venous insufficiency (chronic) (peripheral): Secondary | ICD-10-CM | POA: Diagnosis not present

## 2017-09-10 DIAGNOSIS — L97322 Non-pressure chronic ulcer of left ankle with fat layer exposed: Secondary | ICD-10-CM | POA: Diagnosis not present

## 2017-09-13 DIAGNOSIS — L97322 Non-pressure chronic ulcer of left ankle with fat layer exposed: Secondary | ICD-10-CM | POA: Diagnosis not present

## 2017-09-13 DIAGNOSIS — M86271 Subacute osteomyelitis, right ankle and foot: Secondary | ICD-10-CM | POA: Diagnosis not present

## 2017-09-13 DIAGNOSIS — L97813 Non-pressure chronic ulcer of other part of right lower leg with necrosis of muscle: Secondary | ICD-10-CM | POA: Diagnosis not present

## 2017-09-13 DIAGNOSIS — M150 Primary generalized (osteo)arthritis: Secondary | ICD-10-CM | POA: Diagnosis not present

## 2017-09-13 DIAGNOSIS — L97829 Non-pressure chronic ulcer of other part of left lower leg with unspecified severity: Secondary | ICD-10-CM | POA: Diagnosis not present

## 2017-09-13 DIAGNOSIS — L97521 Non-pressure chronic ulcer of other part of left foot limited to breakdown of skin: Secondary | ICD-10-CM | POA: Diagnosis not present

## 2017-09-17 DIAGNOSIS — L97812 Non-pressure chronic ulcer of other part of right lower leg with fat layer exposed: Secondary | ICD-10-CM | POA: Diagnosis not present

## 2017-09-17 DIAGNOSIS — M1990 Unspecified osteoarthritis, unspecified site: Secondary | ICD-10-CM | POA: Diagnosis not present

## 2017-09-17 DIAGNOSIS — I10 Essential (primary) hypertension: Secondary | ICD-10-CM | POA: Diagnosis not present

## 2017-09-17 DIAGNOSIS — I872 Venous insufficiency (chronic) (peripheral): Secondary | ICD-10-CM | POA: Diagnosis not present

## 2017-09-17 DIAGNOSIS — L97522 Non-pressure chronic ulcer of other part of left foot with fat layer exposed: Secondary | ICD-10-CM | POA: Diagnosis not present

## 2017-09-17 DIAGNOSIS — Z87891 Personal history of nicotine dependence: Secondary | ICD-10-CM | POA: Diagnosis not present

## 2017-09-17 DIAGNOSIS — L97322 Non-pressure chronic ulcer of left ankle with fat layer exposed: Secondary | ICD-10-CM | POA: Diagnosis not present

## 2017-09-17 DIAGNOSIS — Z886 Allergy status to analgesic agent status: Secondary | ICD-10-CM | POA: Diagnosis not present

## 2017-09-17 DIAGNOSIS — L84 Corns and callosities: Secondary | ICD-10-CM | POA: Diagnosis not present

## 2017-09-17 DIAGNOSIS — Z880 Allergy status to penicillin: Secondary | ICD-10-CM | POA: Diagnosis not present

## 2017-09-17 DIAGNOSIS — G629 Polyneuropathy, unspecified: Secondary | ICD-10-CM | POA: Diagnosis not present

## 2017-09-20 DIAGNOSIS — M150 Primary generalized (osteo)arthritis: Secondary | ICD-10-CM | POA: Diagnosis not present

## 2017-09-20 DIAGNOSIS — M86271 Subacute osteomyelitis, right ankle and foot: Secondary | ICD-10-CM | POA: Diagnosis not present

## 2017-09-20 DIAGNOSIS — L97322 Non-pressure chronic ulcer of left ankle with fat layer exposed: Secondary | ICD-10-CM | POA: Diagnosis not present

## 2017-09-20 DIAGNOSIS — L97829 Non-pressure chronic ulcer of other part of left lower leg with unspecified severity: Secondary | ICD-10-CM | POA: Diagnosis not present

## 2017-09-20 DIAGNOSIS — L97521 Non-pressure chronic ulcer of other part of left foot limited to breakdown of skin: Secondary | ICD-10-CM | POA: Diagnosis not present

## 2017-09-20 DIAGNOSIS — L97813 Non-pressure chronic ulcer of other part of right lower leg with necrosis of muscle: Secondary | ICD-10-CM | POA: Diagnosis not present

## 2017-09-24 DIAGNOSIS — M86271 Subacute osteomyelitis, right ankle and foot: Secondary | ICD-10-CM | POA: Diagnosis not present

## 2017-09-24 DIAGNOSIS — M150 Primary generalized (osteo)arthritis: Secondary | ICD-10-CM | POA: Diagnosis not present

## 2017-09-24 DIAGNOSIS — L97813 Non-pressure chronic ulcer of other part of right lower leg with necrosis of muscle: Secondary | ICD-10-CM | POA: Diagnosis not present

## 2017-09-24 DIAGNOSIS — L97829 Non-pressure chronic ulcer of other part of left lower leg with unspecified severity: Secondary | ICD-10-CM | POA: Diagnosis not present

## 2017-09-24 DIAGNOSIS — L97521 Non-pressure chronic ulcer of other part of left foot limited to breakdown of skin: Secondary | ICD-10-CM | POA: Diagnosis not present

## 2017-09-24 DIAGNOSIS — L97322 Non-pressure chronic ulcer of left ankle with fat layer exposed: Secondary | ICD-10-CM | POA: Diagnosis not present

## 2017-09-27 DIAGNOSIS — L97521 Non-pressure chronic ulcer of other part of left foot limited to breakdown of skin: Secondary | ICD-10-CM | POA: Diagnosis not present

## 2017-09-27 DIAGNOSIS — L97829 Non-pressure chronic ulcer of other part of left lower leg with unspecified severity: Secondary | ICD-10-CM | POA: Diagnosis not present

## 2017-09-27 DIAGNOSIS — M86271 Subacute osteomyelitis, right ankle and foot: Secondary | ICD-10-CM | POA: Diagnosis not present

## 2017-09-27 DIAGNOSIS — L97322 Non-pressure chronic ulcer of left ankle with fat layer exposed: Secondary | ICD-10-CM | POA: Diagnosis not present

## 2017-09-27 DIAGNOSIS — M150 Primary generalized (osteo)arthritis: Secondary | ICD-10-CM | POA: Diagnosis not present

## 2017-09-27 DIAGNOSIS — L97813 Non-pressure chronic ulcer of other part of right lower leg with necrosis of muscle: Secondary | ICD-10-CM | POA: Diagnosis not present

## 2017-10-01 DIAGNOSIS — G629 Polyneuropathy, unspecified: Secondary | ICD-10-CM | POA: Diagnosis not present

## 2017-10-01 DIAGNOSIS — L97522 Non-pressure chronic ulcer of other part of left foot with fat layer exposed: Secondary | ICD-10-CM | POA: Diagnosis not present

## 2017-10-01 DIAGNOSIS — Z87891 Personal history of nicotine dependence: Secondary | ICD-10-CM | POA: Diagnosis not present

## 2017-10-01 DIAGNOSIS — I872 Venous insufficiency (chronic) (peripheral): Secondary | ICD-10-CM | POA: Diagnosis not present

## 2017-10-01 DIAGNOSIS — I10 Essential (primary) hypertension: Secondary | ICD-10-CM | POA: Diagnosis not present

## 2017-10-01 DIAGNOSIS — Z886 Allergy status to analgesic agent status: Secondary | ICD-10-CM | POA: Diagnosis not present

## 2017-10-01 DIAGNOSIS — L97322 Non-pressure chronic ulcer of left ankle with fat layer exposed: Secondary | ICD-10-CM | POA: Diagnosis not present

## 2017-10-01 DIAGNOSIS — L97812 Non-pressure chronic ulcer of other part of right lower leg with fat layer exposed: Secondary | ICD-10-CM | POA: Diagnosis not present

## 2017-10-01 DIAGNOSIS — Z880 Allergy status to penicillin: Secondary | ICD-10-CM | POA: Diagnosis not present

## 2017-10-01 DIAGNOSIS — M1990 Unspecified osteoarthritis, unspecified site: Secondary | ICD-10-CM | POA: Diagnosis not present

## 2017-10-04 DIAGNOSIS — M150 Primary generalized (osteo)arthritis: Secondary | ICD-10-CM | POA: Diagnosis not present

## 2017-10-04 DIAGNOSIS — L97322 Non-pressure chronic ulcer of left ankle with fat layer exposed: Secondary | ICD-10-CM | POA: Diagnosis not present

## 2017-10-04 DIAGNOSIS — L97813 Non-pressure chronic ulcer of other part of right lower leg with necrosis of muscle: Secondary | ICD-10-CM | POA: Diagnosis not present

## 2017-10-04 DIAGNOSIS — M86271 Subacute osteomyelitis, right ankle and foot: Secondary | ICD-10-CM | POA: Diagnosis not present

## 2017-10-04 DIAGNOSIS — L97521 Non-pressure chronic ulcer of other part of left foot limited to breakdown of skin: Secondary | ICD-10-CM | POA: Diagnosis not present

## 2017-10-04 DIAGNOSIS — L97829 Non-pressure chronic ulcer of other part of left lower leg with unspecified severity: Secondary | ICD-10-CM | POA: Diagnosis not present

## 2017-10-05 DIAGNOSIS — M170 Bilateral primary osteoarthritis of knee: Secondary | ICD-10-CM | POA: Diagnosis not present

## 2017-10-05 DIAGNOSIS — I10 Essential (primary) hypertension: Secondary | ICD-10-CM | POA: Diagnosis not present

## 2017-10-08 DIAGNOSIS — M86271 Subacute osteomyelitis, right ankle and foot: Secondary | ICD-10-CM | POA: Diagnosis not present

## 2017-10-08 DIAGNOSIS — L97521 Non-pressure chronic ulcer of other part of left foot limited to breakdown of skin: Secondary | ICD-10-CM | POA: Diagnosis not present

## 2017-10-08 DIAGNOSIS — L97322 Non-pressure chronic ulcer of left ankle with fat layer exposed: Secondary | ICD-10-CM | POA: Diagnosis not present

## 2017-10-08 DIAGNOSIS — L97829 Non-pressure chronic ulcer of other part of left lower leg with unspecified severity: Secondary | ICD-10-CM | POA: Diagnosis not present

## 2017-10-08 DIAGNOSIS — M150 Primary generalized (osteo)arthritis: Secondary | ICD-10-CM | POA: Diagnosis not present

## 2017-10-08 DIAGNOSIS — L97813 Non-pressure chronic ulcer of other part of right lower leg with necrosis of muscle: Secondary | ICD-10-CM | POA: Diagnosis not present

## 2017-10-10 DIAGNOSIS — G4709 Other insomnia: Secondary | ICD-10-CM | POA: Diagnosis not present

## 2017-10-10 DIAGNOSIS — M170 Bilateral primary osteoarthritis of knee: Secondary | ICD-10-CM | POA: Diagnosis not present

## 2017-10-10 DIAGNOSIS — M62830 Muscle spasm of back: Secondary | ICD-10-CM | POA: Diagnosis not present

## 2017-10-10 DIAGNOSIS — I10 Essential (primary) hypertension: Secondary | ICD-10-CM | POA: Diagnosis not present

## 2017-10-11 DIAGNOSIS — M86271 Subacute osteomyelitis, right ankle and foot: Secondary | ICD-10-CM | POA: Diagnosis not present

## 2017-10-11 DIAGNOSIS — M150 Primary generalized (osteo)arthritis: Secondary | ICD-10-CM | POA: Diagnosis not present

## 2017-10-11 DIAGNOSIS — L97813 Non-pressure chronic ulcer of other part of right lower leg with necrosis of muscle: Secondary | ICD-10-CM | POA: Diagnosis not present

## 2017-10-11 DIAGNOSIS — L97521 Non-pressure chronic ulcer of other part of left foot limited to breakdown of skin: Secondary | ICD-10-CM | POA: Diagnosis not present

## 2017-10-11 DIAGNOSIS — L97322 Non-pressure chronic ulcer of left ankle with fat layer exposed: Secondary | ICD-10-CM | POA: Diagnosis not present

## 2017-10-11 DIAGNOSIS — L97829 Non-pressure chronic ulcer of other part of left lower leg with unspecified severity: Secondary | ICD-10-CM | POA: Diagnosis not present

## 2017-10-15 DIAGNOSIS — Z880 Allergy status to penicillin: Secondary | ICD-10-CM | POA: Diagnosis not present

## 2017-10-15 DIAGNOSIS — I872 Venous insufficiency (chronic) (peripheral): Secondary | ICD-10-CM | POA: Diagnosis not present

## 2017-10-15 DIAGNOSIS — M1990 Unspecified osteoarthritis, unspecified site: Secondary | ICD-10-CM | POA: Diagnosis not present

## 2017-10-15 DIAGNOSIS — G629 Polyneuropathy, unspecified: Secondary | ICD-10-CM | POA: Diagnosis not present

## 2017-10-15 DIAGNOSIS — Z886 Allergy status to analgesic agent status: Secondary | ICD-10-CM | POA: Diagnosis not present

## 2017-10-15 DIAGNOSIS — L97322 Non-pressure chronic ulcer of left ankle with fat layer exposed: Secondary | ICD-10-CM | POA: Diagnosis not present

## 2017-10-15 DIAGNOSIS — L97812 Non-pressure chronic ulcer of other part of right lower leg with fat layer exposed: Secondary | ICD-10-CM | POA: Diagnosis not present

## 2017-10-15 DIAGNOSIS — L97522 Non-pressure chronic ulcer of other part of left foot with fat layer exposed: Secondary | ICD-10-CM | POA: Diagnosis not present

## 2017-10-15 DIAGNOSIS — I10 Essential (primary) hypertension: Secondary | ICD-10-CM | POA: Diagnosis not present

## 2017-10-15 DIAGNOSIS — Z87891 Personal history of nicotine dependence: Secondary | ICD-10-CM | POA: Diagnosis not present

## 2017-10-18 DIAGNOSIS — M86271 Subacute osteomyelitis, right ankle and foot: Secondary | ICD-10-CM | POA: Diagnosis not present

## 2017-10-18 DIAGNOSIS — L97829 Non-pressure chronic ulcer of other part of left lower leg with unspecified severity: Secondary | ICD-10-CM | POA: Diagnosis not present

## 2017-10-18 DIAGNOSIS — M150 Primary generalized (osteo)arthritis: Secondary | ICD-10-CM | POA: Diagnosis not present

## 2017-10-18 DIAGNOSIS — L97813 Non-pressure chronic ulcer of other part of right lower leg with necrosis of muscle: Secondary | ICD-10-CM | POA: Diagnosis not present

## 2017-10-18 DIAGNOSIS — L97322 Non-pressure chronic ulcer of left ankle with fat layer exposed: Secondary | ICD-10-CM | POA: Diagnosis not present

## 2017-10-18 DIAGNOSIS — L97521 Non-pressure chronic ulcer of other part of left foot limited to breakdown of skin: Secondary | ICD-10-CM | POA: Diagnosis not present

## 2017-10-22 DIAGNOSIS — Z880 Allergy status to penicillin: Secondary | ICD-10-CM | POA: Diagnosis not present

## 2017-10-22 DIAGNOSIS — L97812 Non-pressure chronic ulcer of other part of right lower leg with fat layer exposed: Secondary | ICD-10-CM | POA: Diagnosis not present

## 2017-10-22 DIAGNOSIS — Z87891 Personal history of nicotine dependence: Secondary | ICD-10-CM | POA: Diagnosis not present

## 2017-10-22 DIAGNOSIS — Z886 Allergy status to analgesic agent status: Secondary | ICD-10-CM | POA: Diagnosis not present

## 2017-10-22 DIAGNOSIS — M1990 Unspecified osteoarthritis, unspecified site: Secondary | ICD-10-CM | POA: Diagnosis not present

## 2017-10-22 DIAGNOSIS — I10 Essential (primary) hypertension: Secondary | ICD-10-CM | POA: Diagnosis not present

## 2017-10-22 DIAGNOSIS — I872 Venous insufficiency (chronic) (peripheral): Secondary | ICD-10-CM | POA: Diagnosis not present

## 2017-10-22 DIAGNOSIS — G629 Polyneuropathy, unspecified: Secondary | ICD-10-CM | POA: Diagnosis not present

## 2017-10-22 DIAGNOSIS — L97522 Non-pressure chronic ulcer of other part of left foot with fat layer exposed: Secondary | ICD-10-CM | POA: Diagnosis not present

## 2017-10-22 DIAGNOSIS — L97322 Non-pressure chronic ulcer of left ankle with fat layer exposed: Secondary | ICD-10-CM | POA: Diagnosis not present

## 2017-10-23 DIAGNOSIS — M86361 Chronic multifocal osteomyelitis, right tibia and fibula: Secondary | ICD-10-CM | POA: Diagnosis not present

## 2017-10-23 DIAGNOSIS — M86371 Chronic multifocal osteomyelitis, right ankle and foot: Secondary | ICD-10-CM | POA: Diagnosis not present

## 2017-10-23 DIAGNOSIS — M869 Osteomyelitis, unspecified: Secondary | ICD-10-CM | POA: Diagnosis not present

## 2017-10-25 DIAGNOSIS — L97521 Non-pressure chronic ulcer of other part of left foot limited to breakdown of skin: Secondary | ICD-10-CM | POA: Diagnosis not present

## 2017-10-25 DIAGNOSIS — M150 Primary generalized (osteo)arthritis: Secondary | ICD-10-CM | POA: Diagnosis not present

## 2017-10-25 DIAGNOSIS — L97322 Non-pressure chronic ulcer of left ankle with fat layer exposed: Secondary | ICD-10-CM | POA: Diagnosis not present

## 2017-10-25 DIAGNOSIS — L600 Ingrowing nail: Secondary | ICD-10-CM | POA: Diagnosis not present

## 2017-10-25 DIAGNOSIS — L97813 Non-pressure chronic ulcer of other part of right lower leg with necrosis of muscle: Secondary | ICD-10-CM | POA: Diagnosis not present

## 2017-10-25 DIAGNOSIS — L97829 Non-pressure chronic ulcer of other part of left lower leg with unspecified severity: Secondary | ICD-10-CM | POA: Diagnosis not present

## 2017-10-25 DIAGNOSIS — M86271 Subacute osteomyelitis, right ankle and foot: Secondary | ICD-10-CM | POA: Diagnosis not present

## 2017-10-29 DIAGNOSIS — L97812 Non-pressure chronic ulcer of other part of right lower leg with fat layer exposed: Secondary | ICD-10-CM | POA: Diagnosis not present

## 2017-10-29 DIAGNOSIS — Z87891 Personal history of nicotine dependence: Secondary | ICD-10-CM | POA: Diagnosis not present

## 2017-10-29 DIAGNOSIS — L97522 Non-pressure chronic ulcer of other part of left foot with fat layer exposed: Secondary | ICD-10-CM | POA: Diagnosis not present

## 2017-10-29 DIAGNOSIS — G629 Polyneuropathy, unspecified: Secondary | ICD-10-CM | POA: Diagnosis not present

## 2017-10-29 DIAGNOSIS — Z880 Allergy status to penicillin: Secondary | ICD-10-CM | POA: Diagnosis not present

## 2017-10-29 DIAGNOSIS — I872 Venous insufficiency (chronic) (peripheral): Secondary | ICD-10-CM | POA: Diagnosis not present

## 2017-10-29 DIAGNOSIS — Z886 Allergy status to analgesic agent status: Secondary | ICD-10-CM | POA: Diagnosis not present

## 2017-10-29 DIAGNOSIS — M869 Osteomyelitis, unspecified: Secondary | ICD-10-CM | POA: Diagnosis not present

## 2017-10-29 DIAGNOSIS — L84 Corns and callosities: Secondary | ICD-10-CM | POA: Diagnosis not present

## 2017-10-29 DIAGNOSIS — M1990 Unspecified osteoarthritis, unspecified site: Secondary | ICD-10-CM | POA: Diagnosis not present

## 2017-10-29 DIAGNOSIS — M86672 Other chronic osteomyelitis, left ankle and foot: Secondary | ICD-10-CM | POA: Diagnosis not present

## 2017-10-29 DIAGNOSIS — L97322 Non-pressure chronic ulcer of left ankle with fat layer exposed: Secondary | ICD-10-CM | POA: Diagnosis not present

## 2017-10-29 DIAGNOSIS — I10 Essential (primary) hypertension: Secondary | ICD-10-CM | POA: Diagnosis not present

## 2017-10-30 DIAGNOSIS — Z23 Encounter for immunization: Secondary | ICD-10-CM | POA: Diagnosis not present

## 2017-11-01 DIAGNOSIS — L97322 Non-pressure chronic ulcer of left ankle with fat layer exposed: Secondary | ICD-10-CM | POA: Diagnosis not present

## 2017-11-01 DIAGNOSIS — L97521 Non-pressure chronic ulcer of other part of left foot limited to breakdown of skin: Secondary | ICD-10-CM | POA: Diagnosis not present

## 2017-11-01 DIAGNOSIS — L97813 Non-pressure chronic ulcer of other part of right lower leg with necrosis of muscle: Secondary | ICD-10-CM | POA: Diagnosis not present

## 2017-11-01 DIAGNOSIS — L97829 Non-pressure chronic ulcer of other part of left lower leg with unspecified severity: Secondary | ICD-10-CM | POA: Diagnosis not present

## 2017-11-01 DIAGNOSIS — M150 Primary generalized (osteo)arthritis: Secondary | ICD-10-CM | POA: Diagnosis not present

## 2017-11-02 DIAGNOSIS — M62830 Muscle spasm of back: Secondary | ICD-10-CM | POA: Diagnosis not present

## 2017-11-02 DIAGNOSIS — I10 Essential (primary) hypertension: Secondary | ICD-10-CM | POA: Diagnosis not present

## 2017-11-02 DIAGNOSIS — M170 Bilateral primary osteoarthritis of knee: Secondary | ICD-10-CM | POA: Diagnosis not present

## 2017-11-05 DIAGNOSIS — Z886 Allergy status to analgesic agent status: Secondary | ICD-10-CM | POA: Diagnosis not present

## 2017-11-05 DIAGNOSIS — L97812 Non-pressure chronic ulcer of other part of right lower leg with fat layer exposed: Secondary | ICD-10-CM | POA: Diagnosis not present

## 2017-11-05 DIAGNOSIS — L84 Corns and callosities: Secondary | ICD-10-CM | POA: Diagnosis not present

## 2017-11-05 DIAGNOSIS — I872 Venous insufficiency (chronic) (peripheral): Secondary | ICD-10-CM | POA: Diagnosis not present

## 2017-11-05 DIAGNOSIS — M869 Osteomyelitis, unspecified: Secondary | ICD-10-CM | POA: Diagnosis not present

## 2017-11-05 DIAGNOSIS — Z87891 Personal history of nicotine dependence: Secondary | ICD-10-CM | POA: Diagnosis not present

## 2017-11-05 DIAGNOSIS — Z880 Allergy status to penicillin: Secondary | ICD-10-CM | POA: Diagnosis not present

## 2017-11-05 DIAGNOSIS — L97322 Non-pressure chronic ulcer of left ankle with fat layer exposed: Secondary | ICD-10-CM | POA: Diagnosis not present

## 2017-11-05 DIAGNOSIS — G629 Polyneuropathy, unspecified: Secondary | ICD-10-CM | POA: Diagnosis not present

## 2017-11-05 DIAGNOSIS — I10 Essential (primary) hypertension: Secondary | ICD-10-CM | POA: Diagnosis not present

## 2017-11-05 DIAGNOSIS — L97522 Non-pressure chronic ulcer of other part of left foot with fat layer exposed: Secondary | ICD-10-CM | POA: Diagnosis not present

## 2017-11-05 DIAGNOSIS — M1990 Unspecified osteoarthritis, unspecified site: Secondary | ICD-10-CM | POA: Diagnosis not present

## 2017-11-07 DIAGNOSIS — M869 Osteomyelitis, unspecified: Secondary | ICD-10-CM | POA: Diagnosis not present

## 2017-11-08 DIAGNOSIS — L97829 Non-pressure chronic ulcer of other part of left lower leg with unspecified severity: Secondary | ICD-10-CM | POA: Diagnosis not present

## 2017-11-08 DIAGNOSIS — L97322 Non-pressure chronic ulcer of left ankle with fat layer exposed: Secondary | ICD-10-CM | POA: Diagnosis not present

## 2017-11-08 DIAGNOSIS — M150 Primary generalized (osteo)arthritis: Secondary | ICD-10-CM | POA: Diagnosis not present

## 2017-11-08 DIAGNOSIS — L97813 Non-pressure chronic ulcer of other part of right lower leg with necrosis of muscle: Secondary | ICD-10-CM | POA: Diagnosis not present

## 2017-11-08 DIAGNOSIS — L97521 Non-pressure chronic ulcer of other part of left foot limited to breakdown of skin: Secondary | ICD-10-CM | POA: Diagnosis not present

## 2017-11-12 DIAGNOSIS — G629 Polyneuropathy, unspecified: Secondary | ICD-10-CM | POA: Diagnosis not present

## 2017-11-12 DIAGNOSIS — Z880 Allergy status to penicillin: Secondary | ICD-10-CM | POA: Diagnosis not present

## 2017-11-12 DIAGNOSIS — M869 Osteomyelitis, unspecified: Secondary | ICD-10-CM | POA: Diagnosis not present

## 2017-11-12 DIAGNOSIS — L97812 Non-pressure chronic ulcer of other part of right lower leg with fat layer exposed: Secondary | ICD-10-CM | POA: Diagnosis not present

## 2017-11-12 DIAGNOSIS — Z886 Allergy status to analgesic agent status: Secondary | ICD-10-CM | POA: Diagnosis not present

## 2017-11-12 DIAGNOSIS — M1990 Unspecified osteoarthritis, unspecified site: Secondary | ICD-10-CM | POA: Diagnosis not present

## 2017-11-12 DIAGNOSIS — Z87891 Personal history of nicotine dependence: Secondary | ICD-10-CM | POA: Diagnosis not present

## 2017-11-12 DIAGNOSIS — M86672 Other chronic osteomyelitis, left ankle and foot: Secondary | ICD-10-CM | POA: Diagnosis not present

## 2017-11-12 DIAGNOSIS — L84 Corns and callosities: Secondary | ICD-10-CM | POA: Diagnosis not present

## 2017-11-12 DIAGNOSIS — I10 Essential (primary) hypertension: Secondary | ICD-10-CM | POA: Diagnosis not present

## 2017-11-12 DIAGNOSIS — L97522 Non-pressure chronic ulcer of other part of left foot with fat layer exposed: Secondary | ICD-10-CM | POA: Diagnosis not present

## 2017-11-12 DIAGNOSIS — I872 Venous insufficiency (chronic) (peripheral): Secondary | ICD-10-CM | POA: Diagnosis not present

## 2017-11-12 DIAGNOSIS — L97322 Non-pressure chronic ulcer of left ankle with fat layer exposed: Secondary | ICD-10-CM | POA: Diagnosis not present

## 2017-11-15 DIAGNOSIS — L97813 Non-pressure chronic ulcer of other part of right lower leg with necrosis of muscle: Secondary | ICD-10-CM | POA: Diagnosis not present

## 2017-11-15 DIAGNOSIS — M150 Primary generalized (osteo)arthritis: Secondary | ICD-10-CM | POA: Diagnosis not present

## 2017-11-15 DIAGNOSIS — L97829 Non-pressure chronic ulcer of other part of left lower leg with unspecified severity: Secondary | ICD-10-CM | POA: Diagnosis not present

## 2017-11-15 DIAGNOSIS — L97521 Non-pressure chronic ulcer of other part of left foot limited to breakdown of skin: Secondary | ICD-10-CM | POA: Diagnosis not present

## 2017-11-15 DIAGNOSIS — L97322 Non-pressure chronic ulcer of left ankle with fat layer exposed: Secondary | ICD-10-CM | POA: Diagnosis not present

## 2017-11-19 DIAGNOSIS — G629 Polyneuropathy, unspecified: Secondary | ICD-10-CM | POA: Diagnosis not present

## 2017-11-19 DIAGNOSIS — M869 Osteomyelitis, unspecified: Secondary | ICD-10-CM | POA: Diagnosis not present

## 2017-11-19 DIAGNOSIS — L97322 Non-pressure chronic ulcer of left ankle with fat layer exposed: Secondary | ICD-10-CM | POA: Diagnosis not present

## 2017-11-19 DIAGNOSIS — M1990 Unspecified osteoarthritis, unspecified site: Secondary | ICD-10-CM | POA: Diagnosis not present

## 2017-11-19 DIAGNOSIS — Z886 Allergy status to analgesic agent status: Secondary | ICD-10-CM | POA: Diagnosis not present

## 2017-11-19 DIAGNOSIS — Z87891 Personal history of nicotine dependence: Secondary | ICD-10-CM | POA: Diagnosis not present

## 2017-11-19 DIAGNOSIS — L97522 Non-pressure chronic ulcer of other part of left foot with fat layer exposed: Secondary | ICD-10-CM | POA: Diagnosis not present

## 2017-11-19 DIAGNOSIS — Z880 Allergy status to penicillin: Secondary | ICD-10-CM | POA: Diagnosis not present

## 2017-11-19 DIAGNOSIS — I872 Venous insufficiency (chronic) (peripheral): Secondary | ICD-10-CM | POA: Diagnosis not present

## 2017-11-19 DIAGNOSIS — L84 Corns and callosities: Secondary | ICD-10-CM | POA: Diagnosis not present

## 2017-11-19 DIAGNOSIS — L97812 Non-pressure chronic ulcer of other part of right lower leg with fat layer exposed: Secondary | ICD-10-CM | POA: Diagnosis not present

## 2017-11-19 DIAGNOSIS — I10 Essential (primary) hypertension: Secondary | ICD-10-CM | POA: Diagnosis not present

## 2017-11-20 DIAGNOSIS — I739 Peripheral vascular disease, unspecified: Secondary | ICD-10-CM | POA: Diagnosis not present

## 2017-11-20 DIAGNOSIS — L602 Onychogryphosis: Secondary | ICD-10-CM | POA: Diagnosis not present

## 2017-11-20 DIAGNOSIS — M79675 Pain in left toe(s): Secondary | ICD-10-CM | POA: Diagnosis not present

## 2017-11-20 DIAGNOSIS — M79674 Pain in right toe(s): Secondary | ICD-10-CM | POA: Diagnosis not present

## 2017-11-20 DIAGNOSIS — B351 Tinea unguium: Secondary | ICD-10-CM | POA: Diagnosis not present

## 2017-11-20 DIAGNOSIS — L84 Corns and callosities: Secondary | ICD-10-CM | POA: Diagnosis not present

## 2017-11-20 DIAGNOSIS — L600 Ingrowing nail: Secondary | ICD-10-CM | POA: Diagnosis not present

## 2017-11-22 DIAGNOSIS — L97813 Non-pressure chronic ulcer of other part of right lower leg with necrosis of muscle: Secondary | ICD-10-CM | POA: Diagnosis not present

## 2017-11-22 DIAGNOSIS — M150 Primary generalized (osteo)arthritis: Secondary | ICD-10-CM | POA: Diagnosis not present

## 2017-11-22 DIAGNOSIS — L97322 Non-pressure chronic ulcer of left ankle with fat layer exposed: Secondary | ICD-10-CM | POA: Diagnosis not present

## 2017-11-22 DIAGNOSIS — L97829 Non-pressure chronic ulcer of other part of left lower leg with unspecified severity: Secondary | ICD-10-CM | POA: Diagnosis not present

## 2017-11-22 DIAGNOSIS — L97521 Non-pressure chronic ulcer of other part of left foot limited to breakdown of skin: Secondary | ICD-10-CM | POA: Diagnosis not present

## 2017-11-26 DIAGNOSIS — I10 Essential (primary) hypertension: Secondary | ICD-10-CM | POA: Diagnosis not present

## 2017-11-26 DIAGNOSIS — Z87891 Personal history of nicotine dependence: Secondary | ICD-10-CM | POA: Diagnosis not present

## 2017-11-26 DIAGNOSIS — L84 Corns and callosities: Secondary | ICD-10-CM | POA: Diagnosis not present

## 2017-11-26 DIAGNOSIS — L97812 Non-pressure chronic ulcer of other part of right lower leg with fat layer exposed: Secondary | ICD-10-CM | POA: Diagnosis not present

## 2017-11-26 DIAGNOSIS — M1990 Unspecified osteoarthritis, unspecified site: Secondary | ICD-10-CM | POA: Diagnosis not present

## 2017-11-26 DIAGNOSIS — G629 Polyneuropathy, unspecified: Secondary | ICD-10-CM | POA: Diagnosis not present

## 2017-11-26 DIAGNOSIS — L97522 Non-pressure chronic ulcer of other part of left foot with fat layer exposed: Secondary | ICD-10-CM | POA: Diagnosis not present

## 2017-11-26 DIAGNOSIS — Z880 Allergy status to penicillin: Secondary | ICD-10-CM | POA: Diagnosis not present

## 2017-11-26 DIAGNOSIS — L97322 Non-pressure chronic ulcer of left ankle with fat layer exposed: Secondary | ICD-10-CM | POA: Diagnosis not present

## 2017-11-26 DIAGNOSIS — Z886 Allergy status to analgesic agent status: Secondary | ICD-10-CM | POA: Diagnosis not present

## 2017-11-29 DIAGNOSIS — L97829 Non-pressure chronic ulcer of other part of left lower leg with unspecified severity: Secondary | ICD-10-CM | POA: Diagnosis not present

## 2017-11-29 DIAGNOSIS — L97521 Non-pressure chronic ulcer of other part of left foot limited to breakdown of skin: Secondary | ICD-10-CM | POA: Diagnosis not present

## 2017-11-29 DIAGNOSIS — L97322 Non-pressure chronic ulcer of left ankle with fat layer exposed: Secondary | ICD-10-CM | POA: Diagnosis not present

## 2017-11-29 DIAGNOSIS — L97813 Non-pressure chronic ulcer of other part of right lower leg with necrosis of muscle: Secondary | ICD-10-CM | POA: Diagnosis not present

## 2017-11-29 DIAGNOSIS — M150 Primary generalized (osteo)arthritis: Secondary | ICD-10-CM | POA: Diagnosis not present

## 2017-12-03 DIAGNOSIS — I10 Essential (primary) hypertension: Secondary | ICD-10-CM | POA: Diagnosis not present

## 2017-12-03 DIAGNOSIS — L97813 Non-pressure chronic ulcer of other part of right lower leg with necrosis of muscle: Secondary | ICD-10-CM | POA: Diagnosis not present

## 2017-12-03 DIAGNOSIS — L97829 Non-pressure chronic ulcer of other part of left lower leg with unspecified severity: Secondary | ICD-10-CM | POA: Diagnosis not present

## 2017-12-03 DIAGNOSIS — M62830 Muscle spasm of back: Secondary | ICD-10-CM | POA: Diagnosis not present

## 2017-12-03 DIAGNOSIS — L97322 Non-pressure chronic ulcer of left ankle with fat layer exposed: Secondary | ICD-10-CM | POA: Diagnosis not present

## 2017-12-03 DIAGNOSIS — M150 Primary generalized (osteo)arthritis: Secondary | ICD-10-CM | POA: Diagnosis not present

## 2017-12-03 DIAGNOSIS — M170 Bilateral primary osteoarthritis of knee: Secondary | ICD-10-CM | POA: Diagnosis not present

## 2017-12-03 DIAGNOSIS — L97521 Non-pressure chronic ulcer of other part of left foot limited to breakdown of skin: Secondary | ICD-10-CM | POA: Diagnosis not present

## 2017-12-06 DIAGNOSIS — L97829 Non-pressure chronic ulcer of other part of left lower leg with unspecified severity: Secondary | ICD-10-CM | POA: Diagnosis not present

## 2017-12-06 DIAGNOSIS — L97813 Non-pressure chronic ulcer of other part of right lower leg with necrosis of muscle: Secondary | ICD-10-CM | POA: Diagnosis not present

## 2017-12-06 DIAGNOSIS — L97521 Non-pressure chronic ulcer of other part of left foot limited to breakdown of skin: Secondary | ICD-10-CM | POA: Diagnosis not present

## 2017-12-06 DIAGNOSIS — L97322 Non-pressure chronic ulcer of left ankle with fat layer exposed: Secondary | ICD-10-CM | POA: Diagnosis not present

## 2017-12-06 DIAGNOSIS — M150 Primary generalized (osteo)arthritis: Secondary | ICD-10-CM | POA: Diagnosis not present

## 2017-12-10 DIAGNOSIS — L97812 Non-pressure chronic ulcer of other part of right lower leg with fat layer exposed: Secondary | ICD-10-CM | POA: Diagnosis not present

## 2017-12-10 DIAGNOSIS — Z87891 Personal history of nicotine dependence: Secondary | ICD-10-CM | POA: Diagnosis not present

## 2017-12-10 DIAGNOSIS — I10 Essential (primary) hypertension: Secondary | ICD-10-CM | POA: Diagnosis not present

## 2017-12-10 DIAGNOSIS — L97322 Non-pressure chronic ulcer of left ankle with fat layer exposed: Secondary | ICD-10-CM | POA: Diagnosis not present

## 2017-12-10 DIAGNOSIS — L97522 Non-pressure chronic ulcer of other part of left foot with fat layer exposed: Secondary | ICD-10-CM | POA: Diagnosis not present

## 2017-12-10 DIAGNOSIS — Z886 Allergy status to analgesic agent status: Secondary | ICD-10-CM | POA: Diagnosis not present

## 2017-12-10 DIAGNOSIS — M1990 Unspecified osteoarthritis, unspecified site: Secondary | ICD-10-CM | POA: Diagnosis not present

## 2017-12-10 DIAGNOSIS — Z880 Allergy status to penicillin: Secondary | ICD-10-CM | POA: Diagnosis not present

## 2017-12-10 DIAGNOSIS — L84 Corns and callosities: Secondary | ICD-10-CM | POA: Diagnosis not present

## 2017-12-10 DIAGNOSIS — G629 Polyneuropathy, unspecified: Secondary | ICD-10-CM | POA: Diagnosis not present

## 2017-12-12 DIAGNOSIS — M869 Osteomyelitis, unspecified: Secondary | ICD-10-CM | POA: Diagnosis not present

## 2017-12-12 DIAGNOSIS — I10 Essential (primary) hypertension: Secondary | ICD-10-CM | POA: Diagnosis not present

## 2017-12-12 DIAGNOSIS — I872 Venous insufficiency (chronic) (peripheral): Secondary | ICD-10-CM | POA: Diagnosis not present

## 2017-12-13 DIAGNOSIS — L97322 Non-pressure chronic ulcer of left ankle with fat layer exposed: Secondary | ICD-10-CM | POA: Diagnosis not present

## 2017-12-13 DIAGNOSIS — L97521 Non-pressure chronic ulcer of other part of left foot limited to breakdown of skin: Secondary | ICD-10-CM | POA: Diagnosis not present

## 2017-12-13 DIAGNOSIS — L97829 Non-pressure chronic ulcer of other part of left lower leg with unspecified severity: Secondary | ICD-10-CM | POA: Diagnosis not present

## 2017-12-13 DIAGNOSIS — L97813 Non-pressure chronic ulcer of other part of right lower leg with necrosis of muscle: Secondary | ICD-10-CM | POA: Diagnosis not present

## 2017-12-13 DIAGNOSIS — M150 Primary generalized (osteo)arthritis: Secondary | ICD-10-CM | POA: Diagnosis not present

## 2017-12-17 DIAGNOSIS — L97829 Non-pressure chronic ulcer of other part of left lower leg with unspecified severity: Secondary | ICD-10-CM | POA: Diagnosis not present

## 2017-12-17 DIAGNOSIS — M150 Primary generalized (osteo)arthritis: Secondary | ICD-10-CM | POA: Diagnosis not present

## 2017-12-17 DIAGNOSIS — L97322 Non-pressure chronic ulcer of left ankle with fat layer exposed: Secondary | ICD-10-CM | POA: Diagnosis not present

## 2017-12-17 DIAGNOSIS — L97521 Non-pressure chronic ulcer of other part of left foot limited to breakdown of skin: Secondary | ICD-10-CM | POA: Diagnosis not present

## 2017-12-17 DIAGNOSIS — L97813 Non-pressure chronic ulcer of other part of right lower leg with necrosis of muscle: Secondary | ICD-10-CM | POA: Diagnosis not present

## 2017-12-21 DIAGNOSIS — L97813 Non-pressure chronic ulcer of other part of right lower leg with necrosis of muscle: Secondary | ICD-10-CM | POA: Diagnosis not present

## 2017-12-21 DIAGNOSIS — L97521 Non-pressure chronic ulcer of other part of left foot limited to breakdown of skin: Secondary | ICD-10-CM | POA: Diagnosis not present

## 2017-12-21 DIAGNOSIS — M150 Primary generalized (osteo)arthritis: Secondary | ICD-10-CM | POA: Diagnosis not present

## 2017-12-21 DIAGNOSIS — L97829 Non-pressure chronic ulcer of other part of left lower leg with unspecified severity: Secondary | ICD-10-CM | POA: Diagnosis not present

## 2017-12-21 DIAGNOSIS — L97322 Non-pressure chronic ulcer of left ankle with fat layer exposed: Secondary | ICD-10-CM | POA: Diagnosis not present

## 2017-12-24 DIAGNOSIS — Z87891 Personal history of nicotine dependence: Secondary | ICD-10-CM | POA: Diagnosis not present

## 2017-12-24 DIAGNOSIS — I10 Essential (primary) hypertension: Secondary | ICD-10-CM | POA: Diagnosis not present

## 2017-12-24 DIAGNOSIS — L84 Corns and callosities: Secondary | ICD-10-CM | POA: Diagnosis not present

## 2017-12-24 DIAGNOSIS — M1990 Unspecified osteoarthritis, unspecified site: Secondary | ICD-10-CM | POA: Diagnosis not present

## 2017-12-24 DIAGNOSIS — Z880 Allergy status to penicillin: Secondary | ICD-10-CM | POA: Diagnosis not present

## 2017-12-24 DIAGNOSIS — L97522 Non-pressure chronic ulcer of other part of left foot with fat layer exposed: Secondary | ICD-10-CM | POA: Diagnosis not present

## 2017-12-24 DIAGNOSIS — L97322 Non-pressure chronic ulcer of left ankle with fat layer exposed: Secondary | ICD-10-CM | POA: Diagnosis not present

## 2017-12-24 DIAGNOSIS — L97812 Non-pressure chronic ulcer of other part of right lower leg with fat layer exposed: Secondary | ICD-10-CM | POA: Diagnosis not present

## 2017-12-24 DIAGNOSIS — G629 Polyneuropathy, unspecified: Secondary | ICD-10-CM | POA: Diagnosis not present

## 2017-12-24 DIAGNOSIS — Z886 Allergy status to analgesic agent status: Secondary | ICD-10-CM | POA: Diagnosis not present

## 2017-12-24 DIAGNOSIS — I872 Venous insufficiency (chronic) (peripheral): Secondary | ICD-10-CM | POA: Diagnosis not present

## 2017-12-27 DIAGNOSIS — L97829 Non-pressure chronic ulcer of other part of left lower leg with unspecified severity: Secondary | ICD-10-CM | POA: Diagnosis not present

## 2017-12-27 DIAGNOSIS — L97322 Non-pressure chronic ulcer of left ankle with fat layer exposed: Secondary | ICD-10-CM | POA: Diagnosis not present

## 2017-12-27 DIAGNOSIS — L97521 Non-pressure chronic ulcer of other part of left foot limited to breakdown of skin: Secondary | ICD-10-CM | POA: Diagnosis not present

## 2017-12-27 DIAGNOSIS — L97313 Non-pressure chronic ulcer of right ankle with necrosis of muscle: Secondary | ICD-10-CM | POA: Diagnosis not present

## 2017-12-27 DIAGNOSIS — L97813 Non-pressure chronic ulcer of other part of right lower leg with necrosis of muscle: Secondary | ICD-10-CM | POA: Diagnosis not present

## 2017-12-27 DIAGNOSIS — M150 Primary generalized (osteo)arthritis: Secondary | ICD-10-CM | POA: Diagnosis not present

## 2018-01-01 DIAGNOSIS — I10 Essential (primary) hypertension: Secondary | ICD-10-CM | POA: Diagnosis not present

## 2018-01-01 DIAGNOSIS — M62830 Muscle spasm of back: Secondary | ICD-10-CM | POA: Diagnosis not present

## 2018-01-01 DIAGNOSIS — M170 Bilateral primary osteoarthritis of knee: Secondary | ICD-10-CM | POA: Diagnosis not present

## 2018-01-07 DIAGNOSIS — R700 Elevated erythrocyte sedimentation rate: Secondary | ICD-10-CM | POA: Diagnosis not present

## 2018-01-07 DIAGNOSIS — I872 Venous insufficiency (chronic) (peripheral): Secondary | ICD-10-CM | POA: Diagnosis not present

## 2018-01-07 DIAGNOSIS — L97322 Non-pressure chronic ulcer of left ankle with fat layer exposed: Secondary | ICD-10-CM | POA: Diagnosis not present

## 2018-01-07 DIAGNOSIS — Z87891 Personal history of nicotine dependence: Secondary | ICD-10-CM | POA: Diagnosis not present

## 2018-01-07 DIAGNOSIS — I10 Essential (primary) hypertension: Secondary | ICD-10-CM | POA: Diagnosis not present

## 2018-01-07 DIAGNOSIS — G629 Polyneuropathy, unspecified: Secondary | ICD-10-CM | POA: Diagnosis not present

## 2018-01-07 DIAGNOSIS — L84 Corns and callosities: Secondary | ICD-10-CM | POA: Diagnosis not present

## 2018-01-07 DIAGNOSIS — M1990 Unspecified osteoarthritis, unspecified site: Secondary | ICD-10-CM | POA: Diagnosis not present

## 2018-01-07 DIAGNOSIS — Z880 Allergy status to penicillin: Secondary | ICD-10-CM | POA: Diagnosis not present

## 2018-01-07 DIAGNOSIS — L97522 Non-pressure chronic ulcer of other part of left foot with fat layer exposed: Secondary | ICD-10-CM | POA: Diagnosis not present

## 2018-01-07 DIAGNOSIS — L97812 Non-pressure chronic ulcer of other part of right lower leg with fat layer exposed: Secondary | ICD-10-CM | POA: Diagnosis not present

## 2018-01-07 DIAGNOSIS — Z886 Allergy status to analgesic agent status: Secondary | ICD-10-CM | POA: Diagnosis not present

## 2018-01-18 DIAGNOSIS — G4709 Other insomnia: Secondary | ICD-10-CM | POA: Diagnosis not present

## 2018-01-18 DIAGNOSIS — I10 Essential (primary) hypertension: Secondary | ICD-10-CM | POA: Diagnosis not present

## 2018-01-18 DIAGNOSIS — M170 Bilateral primary osteoarthritis of knee: Secondary | ICD-10-CM | POA: Diagnosis not present

## 2018-01-21 DIAGNOSIS — I10 Essential (primary) hypertension: Secondary | ICD-10-CM | POA: Diagnosis not present

## 2018-01-21 DIAGNOSIS — L97312 Non-pressure chronic ulcer of right ankle with fat layer exposed: Secondary | ICD-10-CM | POA: Diagnosis not present

## 2018-01-21 DIAGNOSIS — L97522 Non-pressure chronic ulcer of other part of left foot with fat layer exposed: Secondary | ICD-10-CM | POA: Diagnosis not present

## 2018-01-21 DIAGNOSIS — L84 Corns and callosities: Secondary | ICD-10-CM | POA: Diagnosis not present

## 2018-01-21 DIAGNOSIS — L97812 Non-pressure chronic ulcer of other part of right lower leg with fat layer exposed: Secondary | ICD-10-CM | POA: Diagnosis not present

## 2018-01-21 DIAGNOSIS — Z87891 Personal history of nicotine dependence: Secondary | ICD-10-CM | POA: Diagnosis not present

## 2018-01-21 DIAGNOSIS — M1990 Unspecified osteoarthritis, unspecified site: Secondary | ICD-10-CM | POA: Diagnosis not present

## 2018-01-21 DIAGNOSIS — Z880 Allergy status to penicillin: Secondary | ICD-10-CM | POA: Diagnosis not present

## 2018-01-21 DIAGNOSIS — L97322 Non-pressure chronic ulcer of left ankle with fat layer exposed: Secondary | ICD-10-CM | POA: Diagnosis not present

## 2018-01-21 DIAGNOSIS — Z886 Allergy status to analgesic agent status: Secondary | ICD-10-CM | POA: Diagnosis not present

## 2018-01-21 DIAGNOSIS — I872 Venous insufficiency (chronic) (peripheral): Secondary | ICD-10-CM | POA: Diagnosis not present

## 2018-01-21 DIAGNOSIS — G629 Polyneuropathy, unspecified: Secondary | ICD-10-CM | POA: Diagnosis not present

## 2019-09-03 DIAGNOSIS — I83029 Varicose veins of left lower extremity with ulcer of unspecified site: Secondary | ICD-10-CM | POA: Diagnosis not present

## 2019-09-03 DIAGNOSIS — L97929 Non-pressure chronic ulcer of unspecified part of left lower leg with unspecified severity: Secondary | ICD-10-CM | POA: Diagnosis not present

## 2019-09-03 DIAGNOSIS — I83019 Varicose veins of right lower extremity with ulcer of unspecified site: Secondary | ICD-10-CM | POA: Diagnosis not present

## 2019-09-03 DIAGNOSIS — L97919 Non-pressure chronic ulcer of unspecified part of right lower leg with unspecified severity: Secondary | ICD-10-CM | POA: Diagnosis not present

## 2019-09-03 DIAGNOSIS — I890 Lymphedema, not elsewhere classified: Secondary | ICD-10-CM | POA: Diagnosis not present

## 2019-11-12 DIAGNOSIS — Z23 Encounter for immunization: Secondary | ICD-10-CM | POA: Diagnosis not present

## 2020-01-20 DIAGNOSIS — I10 Essential (primary) hypertension: Secondary | ICD-10-CM | POA: Diagnosis not present

## 2020-01-20 DIAGNOSIS — L97813 Non-pressure chronic ulcer of other part of right lower leg with necrosis of muscle: Secondary | ICD-10-CM | POA: Diagnosis not present

## 2020-01-20 DIAGNOSIS — M150 Primary generalized (osteo)arthritis: Secondary | ICD-10-CM | POA: Diagnosis not present

## 2020-01-20 DIAGNOSIS — L97521 Non-pressure chronic ulcer of other part of left foot limited to breakdown of skin: Secondary | ICD-10-CM | POA: Diagnosis not present

## 2020-01-20 DIAGNOSIS — L97322 Non-pressure chronic ulcer of left ankle with fat layer exposed: Secondary | ICD-10-CM | POA: Diagnosis not present

## 2020-01-20 DIAGNOSIS — I872 Venous insufficiency (chronic) (peripheral): Secondary | ICD-10-CM | POA: Diagnosis not present

## 2020-01-22 DIAGNOSIS — L97521 Non-pressure chronic ulcer of other part of left foot limited to breakdown of skin: Secondary | ICD-10-CM | POA: Diagnosis not present

## 2020-01-22 DIAGNOSIS — M150 Primary generalized (osteo)arthritis: Secondary | ICD-10-CM | POA: Diagnosis not present

## 2020-01-22 DIAGNOSIS — I872 Venous insufficiency (chronic) (peripheral): Secondary | ICD-10-CM | POA: Diagnosis not present

## 2020-01-22 DIAGNOSIS — L97322 Non-pressure chronic ulcer of left ankle with fat layer exposed: Secondary | ICD-10-CM | POA: Diagnosis not present

## 2020-01-22 DIAGNOSIS — L97813 Non-pressure chronic ulcer of other part of right lower leg with necrosis of muscle: Secondary | ICD-10-CM | POA: Diagnosis not present

## 2020-01-22 DIAGNOSIS — I10 Essential (primary) hypertension: Secondary | ICD-10-CM | POA: Diagnosis not present

## 2020-01-26 DIAGNOSIS — M150 Primary generalized (osteo)arthritis: Secondary | ICD-10-CM | POA: Diagnosis not present

## 2020-01-26 DIAGNOSIS — L97322 Non-pressure chronic ulcer of left ankle with fat layer exposed: Secondary | ICD-10-CM | POA: Diagnosis not present

## 2020-01-26 DIAGNOSIS — I872 Venous insufficiency (chronic) (peripheral): Secondary | ICD-10-CM | POA: Diagnosis not present

## 2020-01-26 DIAGNOSIS — I10 Essential (primary) hypertension: Secondary | ICD-10-CM | POA: Diagnosis not present

## 2020-01-26 DIAGNOSIS — L97813 Non-pressure chronic ulcer of other part of right lower leg with necrosis of muscle: Secondary | ICD-10-CM | POA: Diagnosis not present

## 2020-01-26 DIAGNOSIS — L97521 Non-pressure chronic ulcer of other part of left foot limited to breakdown of skin: Secondary | ICD-10-CM | POA: Diagnosis not present

## 2020-01-28 DIAGNOSIS — I83029 Varicose veins of left lower extremity with ulcer of unspecified site: Secondary | ICD-10-CM | POA: Diagnosis not present

## 2020-01-28 DIAGNOSIS — I890 Lymphedema, not elsewhere classified: Secondary | ICD-10-CM | POA: Diagnosis not present

## 2020-01-28 DIAGNOSIS — L97929 Non-pressure chronic ulcer of unspecified part of left lower leg with unspecified severity: Secondary | ICD-10-CM | POA: Diagnosis not present

## 2020-01-28 DIAGNOSIS — L84 Corns and callosities: Secondary | ICD-10-CM | POA: Diagnosis not present

## 2020-01-28 DIAGNOSIS — R262 Difficulty in walking, not elsewhere classified: Secondary | ICD-10-CM | POA: Diagnosis not present

## 2020-01-28 DIAGNOSIS — I96 Gangrene, not elsewhere classified: Secondary | ICD-10-CM | POA: Diagnosis not present

## 2020-01-28 DIAGNOSIS — I83019 Varicose veins of right lower extremity with ulcer of unspecified site: Secondary | ICD-10-CM | POA: Diagnosis not present

## 2020-01-28 DIAGNOSIS — L97919 Non-pressure chronic ulcer of unspecified part of right lower leg with unspecified severity: Secondary | ICD-10-CM | POA: Diagnosis not present

## 2020-02-02 DIAGNOSIS — L97322 Non-pressure chronic ulcer of left ankle with fat layer exposed: Secondary | ICD-10-CM | POA: Diagnosis not present

## 2020-02-02 DIAGNOSIS — I10 Essential (primary) hypertension: Secondary | ICD-10-CM | POA: Diagnosis not present

## 2020-02-02 DIAGNOSIS — M150 Primary generalized (osteo)arthritis: Secondary | ICD-10-CM | POA: Diagnosis not present

## 2020-02-02 DIAGNOSIS — L97521 Non-pressure chronic ulcer of other part of left foot limited to breakdown of skin: Secondary | ICD-10-CM | POA: Diagnosis not present

## 2020-02-02 DIAGNOSIS — L97813 Non-pressure chronic ulcer of other part of right lower leg with necrosis of muscle: Secondary | ICD-10-CM | POA: Diagnosis not present

## 2020-02-02 DIAGNOSIS — I872 Venous insufficiency (chronic) (peripheral): Secondary | ICD-10-CM | POA: Diagnosis not present

## 2020-02-05 DIAGNOSIS — I10 Essential (primary) hypertension: Secondary | ICD-10-CM | POA: Diagnosis not present

## 2020-02-05 DIAGNOSIS — L97813 Non-pressure chronic ulcer of other part of right lower leg with necrosis of muscle: Secondary | ICD-10-CM | POA: Diagnosis not present

## 2020-02-05 DIAGNOSIS — L97521 Non-pressure chronic ulcer of other part of left foot limited to breakdown of skin: Secondary | ICD-10-CM | POA: Diagnosis not present

## 2020-02-05 DIAGNOSIS — M150 Primary generalized (osteo)arthritis: Secondary | ICD-10-CM | POA: Diagnosis not present

## 2020-02-05 DIAGNOSIS — I872 Venous insufficiency (chronic) (peripheral): Secondary | ICD-10-CM | POA: Diagnosis not present

## 2020-02-05 DIAGNOSIS — L97322 Non-pressure chronic ulcer of left ankle with fat layer exposed: Secondary | ICD-10-CM | POA: Diagnosis not present

## 2020-02-12 DIAGNOSIS — M150 Primary generalized (osteo)arthritis: Secondary | ICD-10-CM | POA: Diagnosis not present

## 2020-02-12 DIAGNOSIS — I10 Essential (primary) hypertension: Secondary | ICD-10-CM | POA: Diagnosis not present

## 2020-02-12 DIAGNOSIS — I872 Venous insufficiency (chronic) (peripheral): Secondary | ICD-10-CM | POA: Diagnosis not present

## 2020-02-12 DIAGNOSIS — L97322 Non-pressure chronic ulcer of left ankle with fat layer exposed: Secondary | ICD-10-CM | POA: Diagnosis not present

## 2020-02-12 DIAGNOSIS — L97813 Non-pressure chronic ulcer of other part of right lower leg with necrosis of muscle: Secondary | ICD-10-CM | POA: Diagnosis not present

## 2020-02-12 DIAGNOSIS — L97521 Non-pressure chronic ulcer of other part of left foot limited to breakdown of skin: Secondary | ICD-10-CM | POA: Diagnosis not present

## 2020-02-16 DIAGNOSIS — M150 Primary generalized (osteo)arthritis: Secondary | ICD-10-CM | POA: Diagnosis not present

## 2020-02-16 DIAGNOSIS — I10 Essential (primary) hypertension: Secondary | ICD-10-CM | POA: Diagnosis not present

## 2020-02-16 DIAGNOSIS — L97322 Non-pressure chronic ulcer of left ankle with fat layer exposed: Secondary | ICD-10-CM | POA: Diagnosis not present

## 2020-02-16 DIAGNOSIS — I872 Venous insufficiency (chronic) (peripheral): Secondary | ICD-10-CM | POA: Diagnosis not present

## 2020-02-16 DIAGNOSIS — L97813 Non-pressure chronic ulcer of other part of right lower leg with necrosis of muscle: Secondary | ICD-10-CM | POA: Diagnosis not present

## 2020-02-16 DIAGNOSIS — L97521 Non-pressure chronic ulcer of other part of left foot limited to breakdown of skin: Secondary | ICD-10-CM | POA: Diagnosis not present

## 2020-02-18 DIAGNOSIS — L97319 Non-pressure chronic ulcer of right ankle with unspecified severity: Secondary | ICD-10-CM | POA: Diagnosis not present

## 2020-02-18 DIAGNOSIS — Z2239 Carrier of other specified bacterial diseases: Secondary | ICD-10-CM | POA: Diagnosis not present

## 2020-02-18 DIAGNOSIS — I890 Lymphedema, not elsewhere classified: Secondary | ICD-10-CM | POA: Diagnosis not present

## 2020-02-18 DIAGNOSIS — I96 Gangrene, not elsewhere classified: Secondary | ICD-10-CM | POA: Diagnosis not present

## 2020-02-18 DIAGNOSIS — L97329 Non-pressure chronic ulcer of left ankle with unspecified severity: Secondary | ICD-10-CM | POA: Diagnosis not present

## 2020-02-18 DIAGNOSIS — L97521 Non-pressure chronic ulcer of other part of left foot limited to breakdown of skin: Secondary | ICD-10-CM | POA: Diagnosis not present

## 2020-02-19 DIAGNOSIS — L97521 Non-pressure chronic ulcer of other part of left foot limited to breakdown of skin: Secondary | ICD-10-CM | POA: Diagnosis not present

## 2020-02-19 DIAGNOSIS — I872 Venous insufficiency (chronic) (peripheral): Secondary | ICD-10-CM | POA: Diagnosis not present

## 2020-02-19 DIAGNOSIS — M150 Primary generalized (osteo)arthritis: Secondary | ICD-10-CM | POA: Diagnosis not present

## 2020-02-19 DIAGNOSIS — M170 Bilateral primary osteoarthritis of knee: Secondary | ICD-10-CM | POA: Diagnosis not present

## 2020-02-19 DIAGNOSIS — L97813 Non-pressure chronic ulcer of other part of right lower leg with necrosis of muscle: Secondary | ICD-10-CM | POA: Diagnosis not present

## 2020-02-19 DIAGNOSIS — M21062 Valgus deformity, not elsewhere classified, left knee: Secondary | ICD-10-CM | POA: Diagnosis not present

## 2020-02-19 DIAGNOSIS — I10 Essential (primary) hypertension: Secondary | ICD-10-CM | POA: Diagnosis not present

## 2020-02-19 DIAGNOSIS — M21061 Valgus deformity, not elsewhere classified, right knee: Secondary | ICD-10-CM | POA: Diagnosis not present

## 2020-02-19 DIAGNOSIS — L97322 Non-pressure chronic ulcer of left ankle with fat layer exposed: Secondary | ICD-10-CM | POA: Diagnosis not present

## 2020-02-21 DIAGNOSIS — M158 Other polyosteoarthritis: Secondary | ICD-10-CM | POA: Diagnosis not present

## 2020-02-21 DIAGNOSIS — I10 Essential (primary) hypertension: Secondary | ICD-10-CM | POA: Diagnosis not present

## 2020-02-23 DIAGNOSIS — L97813 Non-pressure chronic ulcer of other part of right lower leg with necrosis of muscle: Secondary | ICD-10-CM | POA: Diagnosis not present

## 2020-02-23 DIAGNOSIS — L97521 Non-pressure chronic ulcer of other part of left foot limited to breakdown of skin: Secondary | ICD-10-CM | POA: Diagnosis not present

## 2020-02-23 DIAGNOSIS — M150 Primary generalized (osteo)arthritis: Secondary | ICD-10-CM | POA: Diagnosis not present

## 2020-02-23 DIAGNOSIS — I872 Venous insufficiency (chronic) (peripheral): Secondary | ICD-10-CM | POA: Diagnosis not present

## 2020-02-23 DIAGNOSIS — I10 Essential (primary) hypertension: Secondary | ICD-10-CM | POA: Diagnosis not present

## 2020-02-23 DIAGNOSIS — L97322 Non-pressure chronic ulcer of left ankle with fat layer exposed: Secondary | ICD-10-CM | POA: Diagnosis not present

## 2020-02-26 DIAGNOSIS — I872 Venous insufficiency (chronic) (peripheral): Secondary | ICD-10-CM | POA: Diagnosis not present

## 2020-02-26 DIAGNOSIS — I10 Essential (primary) hypertension: Secondary | ICD-10-CM | POA: Diagnosis not present

## 2020-02-26 DIAGNOSIS — L97322 Non-pressure chronic ulcer of left ankle with fat layer exposed: Secondary | ICD-10-CM | POA: Diagnosis not present

## 2020-02-26 DIAGNOSIS — L97813 Non-pressure chronic ulcer of other part of right lower leg with necrosis of muscle: Secondary | ICD-10-CM | POA: Diagnosis not present

## 2020-02-26 DIAGNOSIS — M150 Primary generalized (osteo)arthritis: Secondary | ICD-10-CM | POA: Diagnosis not present

## 2020-02-26 DIAGNOSIS — L97521 Non-pressure chronic ulcer of other part of left foot limited to breakdown of skin: Secondary | ICD-10-CM | POA: Diagnosis not present

## 2020-03-01 DIAGNOSIS — I10 Essential (primary) hypertension: Secondary | ICD-10-CM | POA: Diagnosis not present

## 2020-03-01 DIAGNOSIS — L97813 Non-pressure chronic ulcer of other part of right lower leg with necrosis of muscle: Secondary | ICD-10-CM | POA: Diagnosis not present

## 2020-03-01 DIAGNOSIS — I872 Venous insufficiency (chronic) (peripheral): Secondary | ICD-10-CM | POA: Diagnosis not present

## 2020-03-01 DIAGNOSIS — M150 Primary generalized (osteo)arthritis: Secondary | ICD-10-CM | POA: Diagnosis not present

## 2020-03-01 DIAGNOSIS — L97322 Non-pressure chronic ulcer of left ankle with fat layer exposed: Secondary | ICD-10-CM | POA: Diagnosis not present

## 2020-03-01 DIAGNOSIS — L97521 Non-pressure chronic ulcer of other part of left foot limited to breakdown of skin: Secondary | ICD-10-CM | POA: Diagnosis not present

## 2020-03-04 DIAGNOSIS — I872 Venous insufficiency (chronic) (peripheral): Secondary | ICD-10-CM | POA: Diagnosis not present

## 2020-03-04 DIAGNOSIS — M150 Primary generalized (osteo)arthritis: Secondary | ICD-10-CM | POA: Diagnosis not present

## 2020-03-04 DIAGNOSIS — L97813 Non-pressure chronic ulcer of other part of right lower leg with necrosis of muscle: Secondary | ICD-10-CM | POA: Diagnosis not present

## 2020-03-04 DIAGNOSIS — L97521 Non-pressure chronic ulcer of other part of left foot limited to breakdown of skin: Secondary | ICD-10-CM | POA: Diagnosis not present

## 2020-03-04 DIAGNOSIS — I10 Essential (primary) hypertension: Secondary | ICD-10-CM | POA: Diagnosis not present

## 2020-03-04 DIAGNOSIS — L97322 Non-pressure chronic ulcer of left ankle with fat layer exposed: Secondary | ICD-10-CM | POA: Diagnosis not present

## 2020-03-08 DIAGNOSIS — M150 Primary generalized (osteo)arthritis: Secondary | ICD-10-CM | POA: Diagnosis not present

## 2020-03-08 DIAGNOSIS — L97813 Non-pressure chronic ulcer of other part of right lower leg with necrosis of muscle: Secondary | ICD-10-CM | POA: Diagnosis not present

## 2020-03-08 DIAGNOSIS — L97521 Non-pressure chronic ulcer of other part of left foot limited to breakdown of skin: Secondary | ICD-10-CM | POA: Diagnosis not present

## 2020-03-08 DIAGNOSIS — I10 Essential (primary) hypertension: Secondary | ICD-10-CM | POA: Diagnosis not present

## 2020-03-08 DIAGNOSIS — I872 Venous insufficiency (chronic) (peripheral): Secondary | ICD-10-CM | POA: Diagnosis not present

## 2020-03-08 DIAGNOSIS — L97322 Non-pressure chronic ulcer of left ankle with fat layer exposed: Secondary | ICD-10-CM | POA: Diagnosis not present

## 2020-03-09 DIAGNOSIS — M79674 Pain in right toe(s): Secondary | ICD-10-CM | POA: Diagnosis not present

## 2020-03-09 DIAGNOSIS — L84 Corns and callosities: Secondary | ICD-10-CM | POA: Diagnosis not present

## 2020-03-09 DIAGNOSIS — M79675 Pain in left toe(s): Secondary | ICD-10-CM | POA: Diagnosis not present

## 2020-03-09 DIAGNOSIS — I739 Peripheral vascular disease, unspecified: Secondary | ICD-10-CM | POA: Diagnosis not present

## 2020-03-09 DIAGNOSIS — L602 Onychogryphosis: Secondary | ICD-10-CM | POA: Diagnosis not present

## 2020-03-09 DIAGNOSIS — B351 Tinea unguium: Secondary | ICD-10-CM | POA: Diagnosis not present

## 2020-03-09 DIAGNOSIS — L600 Ingrowing nail: Secondary | ICD-10-CM | POA: Diagnosis not present

## 2020-03-10 DIAGNOSIS — I83029 Varicose veins of left lower extremity with ulcer of unspecified site: Secondary | ICD-10-CM | POA: Diagnosis not present

## 2020-03-10 DIAGNOSIS — L97929 Non-pressure chronic ulcer of unspecified part of left lower leg with unspecified severity: Secondary | ICD-10-CM | POA: Diagnosis not present

## 2020-03-10 DIAGNOSIS — L97919 Non-pressure chronic ulcer of unspecified part of right lower leg with unspecified severity: Secondary | ICD-10-CM | POA: Diagnosis not present

## 2020-03-10 DIAGNOSIS — I83019 Varicose veins of right lower extremity with ulcer of unspecified site: Secondary | ICD-10-CM | POA: Diagnosis not present

## 2020-03-10 DIAGNOSIS — I890 Lymphedema, not elsewhere classified: Secondary | ICD-10-CM | POA: Diagnosis not present

## 2020-03-15 DIAGNOSIS — M150 Primary generalized (osteo)arthritis: Secondary | ICD-10-CM | POA: Diagnosis not present

## 2020-03-15 DIAGNOSIS — I872 Venous insufficiency (chronic) (peripheral): Secondary | ICD-10-CM | POA: Diagnosis not present

## 2020-03-15 DIAGNOSIS — L97521 Non-pressure chronic ulcer of other part of left foot limited to breakdown of skin: Secondary | ICD-10-CM | POA: Diagnosis not present

## 2020-03-15 DIAGNOSIS — L97322 Non-pressure chronic ulcer of left ankle with fat layer exposed: Secondary | ICD-10-CM | POA: Diagnosis not present

## 2020-03-15 DIAGNOSIS — L97813 Non-pressure chronic ulcer of other part of right lower leg with necrosis of muscle: Secondary | ICD-10-CM | POA: Diagnosis not present

## 2020-03-15 DIAGNOSIS — I10 Essential (primary) hypertension: Secondary | ICD-10-CM | POA: Diagnosis not present

## 2020-03-18 DIAGNOSIS — L97322 Non-pressure chronic ulcer of left ankle with fat layer exposed: Secondary | ICD-10-CM | POA: Diagnosis not present

## 2020-03-18 DIAGNOSIS — M150 Primary generalized (osteo)arthritis: Secondary | ICD-10-CM | POA: Diagnosis not present

## 2020-03-18 DIAGNOSIS — I10 Essential (primary) hypertension: Secondary | ICD-10-CM | POA: Diagnosis not present

## 2020-03-18 DIAGNOSIS — I872 Venous insufficiency (chronic) (peripheral): Secondary | ICD-10-CM | POA: Diagnosis not present

## 2020-03-18 DIAGNOSIS — L97521 Non-pressure chronic ulcer of other part of left foot limited to breakdown of skin: Secondary | ICD-10-CM | POA: Diagnosis not present

## 2020-03-18 DIAGNOSIS — L97813 Non-pressure chronic ulcer of other part of right lower leg with necrosis of muscle: Secondary | ICD-10-CM | POA: Diagnosis not present

## 2020-03-20 DIAGNOSIS — I872 Venous insufficiency (chronic) (peripheral): Secondary | ICD-10-CM | POA: Diagnosis not present

## 2020-03-20 DIAGNOSIS — L97322 Non-pressure chronic ulcer of left ankle with fat layer exposed: Secondary | ICD-10-CM | POA: Diagnosis not present

## 2020-03-20 DIAGNOSIS — L97521 Non-pressure chronic ulcer of other part of left foot limited to breakdown of skin: Secondary | ICD-10-CM | POA: Diagnosis not present

## 2020-03-20 DIAGNOSIS — M150 Primary generalized (osteo)arthritis: Secondary | ICD-10-CM | POA: Diagnosis not present

## 2020-03-20 DIAGNOSIS — I10 Essential (primary) hypertension: Secondary | ICD-10-CM | POA: Diagnosis not present

## 2020-03-20 DIAGNOSIS — L97813 Non-pressure chronic ulcer of other part of right lower leg with necrosis of muscle: Secondary | ICD-10-CM | POA: Diagnosis not present

## 2020-03-22 DIAGNOSIS — L97322 Non-pressure chronic ulcer of left ankle with fat layer exposed: Secondary | ICD-10-CM | POA: Diagnosis not present

## 2020-03-22 DIAGNOSIS — I10 Essential (primary) hypertension: Secondary | ICD-10-CM | POA: Diagnosis not present

## 2020-03-22 DIAGNOSIS — M150 Primary generalized (osteo)arthritis: Secondary | ICD-10-CM | POA: Diagnosis not present

## 2020-03-22 DIAGNOSIS — I872 Venous insufficiency (chronic) (peripheral): Secondary | ICD-10-CM | POA: Diagnosis not present

## 2020-03-22 DIAGNOSIS — Z1231 Encounter for screening mammogram for malignant neoplasm of breast: Secondary | ICD-10-CM | POA: Diagnosis not present

## 2020-03-22 DIAGNOSIS — L97521 Non-pressure chronic ulcer of other part of left foot limited to breakdown of skin: Secondary | ICD-10-CM | POA: Diagnosis not present

## 2020-03-22 DIAGNOSIS — L97813 Non-pressure chronic ulcer of other part of right lower leg with necrosis of muscle: Secondary | ICD-10-CM | POA: Diagnosis not present

## 2020-03-23 DIAGNOSIS — M170 Bilateral primary osteoarthritis of knee: Secondary | ICD-10-CM | POA: Diagnosis not present

## 2020-03-23 DIAGNOSIS — I10 Essential (primary) hypertension: Secondary | ICD-10-CM | POA: Diagnosis not present

## 2020-03-25 DIAGNOSIS — I10 Essential (primary) hypertension: Secondary | ICD-10-CM | POA: Diagnosis not present

## 2020-03-25 DIAGNOSIS — L97322 Non-pressure chronic ulcer of left ankle with fat layer exposed: Secondary | ICD-10-CM | POA: Diagnosis not present

## 2020-03-25 DIAGNOSIS — L97521 Non-pressure chronic ulcer of other part of left foot limited to breakdown of skin: Secondary | ICD-10-CM | POA: Diagnosis not present

## 2020-03-25 DIAGNOSIS — L97813 Non-pressure chronic ulcer of other part of right lower leg with necrosis of muscle: Secondary | ICD-10-CM | POA: Diagnosis not present

## 2020-03-25 DIAGNOSIS — M150 Primary generalized (osteo)arthritis: Secondary | ICD-10-CM | POA: Diagnosis not present

## 2020-03-25 DIAGNOSIS — I872 Venous insufficiency (chronic) (peripheral): Secondary | ICD-10-CM | POA: Diagnosis not present

## 2020-03-29 DIAGNOSIS — L97521 Non-pressure chronic ulcer of other part of left foot limited to breakdown of skin: Secondary | ICD-10-CM | POA: Diagnosis not present

## 2020-03-29 DIAGNOSIS — I872 Venous insufficiency (chronic) (peripheral): Secondary | ICD-10-CM | POA: Diagnosis not present

## 2020-03-29 DIAGNOSIS — M150 Primary generalized (osteo)arthritis: Secondary | ICD-10-CM | POA: Diagnosis not present

## 2020-03-29 DIAGNOSIS — I10 Essential (primary) hypertension: Secondary | ICD-10-CM | POA: Diagnosis not present

## 2020-03-29 DIAGNOSIS — L97813 Non-pressure chronic ulcer of other part of right lower leg with necrosis of muscle: Secondary | ICD-10-CM | POA: Diagnosis not present

## 2020-03-29 DIAGNOSIS — L97322 Non-pressure chronic ulcer of left ankle with fat layer exposed: Secondary | ICD-10-CM | POA: Diagnosis not present

## 2020-03-31 DIAGNOSIS — L97521 Non-pressure chronic ulcer of other part of left foot limited to breakdown of skin: Secondary | ICD-10-CM | POA: Diagnosis not present

## 2020-03-31 DIAGNOSIS — L97311 Non-pressure chronic ulcer of right ankle limited to breakdown of skin: Secondary | ICD-10-CM | POA: Diagnosis not present

## 2020-03-31 DIAGNOSIS — I83019 Varicose veins of right lower extremity with ulcer of unspecified site: Secondary | ICD-10-CM | POA: Diagnosis not present

## 2020-03-31 DIAGNOSIS — I83029 Varicose veins of left lower extremity with ulcer of unspecified site: Secondary | ICD-10-CM | POA: Diagnosis not present

## 2020-03-31 DIAGNOSIS — L97929 Non-pressure chronic ulcer of unspecified part of left lower leg with unspecified severity: Secondary | ICD-10-CM | POA: Diagnosis not present

## 2020-03-31 DIAGNOSIS — I83011 Varicose veins of right lower extremity with ulcer of thigh: Secondary | ICD-10-CM | POA: Diagnosis not present

## 2020-03-31 DIAGNOSIS — M869 Osteomyelitis, unspecified: Secondary | ICD-10-CM | POA: Diagnosis not present

## 2020-03-31 DIAGNOSIS — R21 Rash and other nonspecific skin eruption: Secondary | ICD-10-CM | POA: Diagnosis not present

## 2020-03-31 DIAGNOSIS — I10 Essential (primary) hypertension: Secondary | ICD-10-CM | POA: Diagnosis not present

## 2020-03-31 DIAGNOSIS — L97529 Non-pressure chronic ulcer of other part of left foot with unspecified severity: Secondary | ICD-10-CM | POA: Diagnosis not present

## 2020-03-31 DIAGNOSIS — I890 Lymphedema, not elsewhere classified: Secondary | ICD-10-CM | POA: Diagnosis not present

## 2020-03-31 DIAGNOSIS — L97321 Non-pressure chronic ulcer of left ankle limited to breakdown of skin: Secondary | ICD-10-CM | POA: Diagnosis not present

## 2020-03-31 DIAGNOSIS — L97919 Non-pressure chronic ulcer of unspecified part of right lower leg with unspecified severity: Secondary | ICD-10-CM | POA: Diagnosis not present

## 2020-04-05 DIAGNOSIS — L97322 Non-pressure chronic ulcer of left ankle with fat layer exposed: Secondary | ICD-10-CM | POA: Diagnosis not present

## 2020-04-05 DIAGNOSIS — M150 Primary generalized (osteo)arthritis: Secondary | ICD-10-CM | POA: Diagnosis not present

## 2020-04-05 DIAGNOSIS — I872 Venous insufficiency (chronic) (peripheral): Secondary | ICD-10-CM | POA: Diagnosis not present

## 2020-04-05 DIAGNOSIS — L97521 Non-pressure chronic ulcer of other part of left foot limited to breakdown of skin: Secondary | ICD-10-CM | POA: Diagnosis not present

## 2020-04-05 DIAGNOSIS — L97813 Non-pressure chronic ulcer of other part of right lower leg with necrosis of muscle: Secondary | ICD-10-CM | POA: Diagnosis not present

## 2020-04-05 DIAGNOSIS — I10 Essential (primary) hypertension: Secondary | ICD-10-CM | POA: Diagnosis not present

## 2020-04-08 DIAGNOSIS — L97322 Non-pressure chronic ulcer of left ankle with fat layer exposed: Secondary | ICD-10-CM | POA: Diagnosis not present

## 2020-04-08 DIAGNOSIS — L97521 Non-pressure chronic ulcer of other part of left foot limited to breakdown of skin: Secondary | ICD-10-CM | POA: Diagnosis not present

## 2020-04-08 DIAGNOSIS — M150 Primary generalized (osteo)arthritis: Secondary | ICD-10-CM | POA: Diagnosis not present

## 2020-04-08 DIAGNOSIS — I872 Venous insufficiency (chronic) (peripheral): Secondary | ICD-10-CM | POA: Diagnosis not present

## 2020-04-08 DIAGNOSIS — L97813 Non-pressure chronic ulcer of other part of right lower leg with necrosis of muscle: Secondary | ICD-10-CM | POA: Diagnosis not present

## 2020-04-08 DIAGNOSIS — I10 Essential (primary) hypertension: Secondary | ICD-10-CM | POA: Diagnosis not present

## 2020-04-12 DIAGNOSIS — L97521 Non-pressure chronic ulcer of other part of left foot limited to breakdown of skin: Secondary | ICD-10-CM | POA: Diagnosis not present

## 2020-04-12 DIAGNOSIS — L97813 Non-pressure chronic ulcer of other part of right lower leg with necrosis of muscle: Secondary | ICD-10-CM | POA: Diagnosis not present

## 2020-04-12 DIAGNOSIS — I872 Venous insufficiency (chronic) (peripheral): Secondary | ICD-10-CM | POA: Diagnosis not present

## 2020-04-12 DIAGNOSIS — M150 Primary generalized (osteo)arthritis: Secondary | ICD-10-CM | POA: Diagnosis not present

## 2020-04-12 DIAGNOSIS — L97322 Non-pressure chronic ulcer of left ankle with fat layer exposed: Secondary | ICD-10-CM | POA: Diagnosis not present

## 2020-04-12 DIAGNOSIS — I10 Essential (primary) hypertension: Secondary | ICD-10-CM | POA: Diagnosis not present

## 2020-04-15 DIAGNOSIS — L97521 Non-pressure chronic ulcer of other part of left foot limited to breakdown of skin: Secondary | ICD-10-CM | POA: Diagnosis not present

## 2020-04-15 DIAGNOSIS — L97322 Non-pressure chronic ulcer of left ankle with fat layer exposed: Secondary | ICD-10-CM | POA: Diagnosis not present

## 2020-04-15 DIAGNOSIS — I872 Venous insufficiency (chronic) (peripheral): Secondary | ICD-10-CM | POA: Diagnosis not present

## 2020-04-15 DIAGNOSIS — I10 Essential (primary) hypertension: Secondary | ICD-10-CM | POA: Diagnosis not present

## 2020-04-15 DIAGNOSIS — M150 Primary generalized (osteo)arthritis: Secondary | ICD-10-CM | POA: Diagnosis not present

## 2020-04-15 DIAGNOSIS — L97813 Non-pressure chronic ulcer of other part of right lower leg with necrosis of muscle: Secondary | ICD-10-CM | POA: Diagnosis not present

## 2020-04-19 DIAGNOSIS — L97813 Non-pressure chronic ulcer of other part of right lower leg with necrosis of muscle: Secondary | ICD-10-CM | POA: Diagnosis not present

## 2020-04-19 DIAGNOSIS — I10 Essential (primary) hypertension: Secondary | ICD-10-CM | POA: Diagnosis not present

## 2020-04-19 DIAGNOSIS — L97521 Non-pressure chronic ulcer of other part of left foot limited to breakdown of skin: Secondary | ICD-10-CM | POA: Diagnosis not present

## 2020-04-19 DIAGNOSIS — L97322 Non-pressure chronic ulcer of left ankle with fat layer exposed: Secondary | ICD-10-CM | POA: Diagnosis not present

## 2020-04-19 DIAGNOSIS — I872 Venous insufficiency (chronic) (peripheral): Secondary | ICD-10-CM | POA: Diagnosis not present

## 2020-04-19 DIAGNOSIS — M150 Primary generalized (osteo)arthritis: Secondary | ICD-10-CM | POA: Diagnosis not present

## 2020-04-21 DIAGNOSIS — I890 Lymphedema, not elsewhere classified: Secondary | ICD-10-CM | POA: Diagnosis not present

## 2020-04-21 DIAGNOSIS — I10 Essential (primary) hypertension: Secondary | ICD-10-CM | POA: Diagnosis not present

## 2020-04-21 DIAGNOSIS — M810 Age-related osteoporosis without current pathological fracture: Secondary | ICD-10-CM | POA: Diagnosis not present

## 2020-04-21 DIAGNOSIS — L97311 Non-pressure chronic ulcer of right ankle limited to breakdown of skin: Secondary | ICD-10-CM | POA: Diagnosis not present

## 2020-04-26 DIAGNOSIS — I10 Essential (primary) hypertension: Secondary | ICD-10-CM | POA: Diagnosis not present

## 2020-04-26 DIAGNOSIS — L97322 Non-pressure chronic ulcer of left ankle with fat layer exposed: Secondary | ICD-10-CM | POA: Diagnosis not present

## 2020-04-26 DIAGNOSIS — M150 Primary generalized (osteo)arthritis: Secondary | ICD-10-CM | POA: Diagnosis not present

## 2020-04-26 DIAGNOSIS — I872 Venous insufficiency (chronic) (peripheral): Secondary | ICD-10-CM | POA: Diagnosis not present

## 2020-04-26 DIAGNOSIS — L97521 Non-pressure chronic ulcer of other part of left foot limited to breakdown of skin: Secondary | ICD-10-CM | POA: Diagnosis not present

## 2020-04-26 DIAGNOSIS — L97813 Non-pressure chronic ulcer of other part of right lower leg with necrosis of muscle: Secondary | ICD-10-CM | POA: Diagnosis not present

## 2020-04-29 DIAGNOSIS — I10 Essential (primary) hypertension: Secondary | ICD-10-CM | POA: Diagnosis not present

## 2020-04-29 DIAGNOSIS — L97813 Non-pressure chronic ulcer of other part of right lower leg with necrosis of muscle: Secondary | ICD-10-CM | POA: Diagnosis not present

## 2020-04-29 DIAGNOSIS — L97322 Non-pressure chronic ulcer of left ankle with fat layer exposed: Secondary | ICD-10-CM | POA: Diagnosis not present

## 2020-04-29 DIAGNOSIS — I872 Venous insufficiency (chronic) (peripheral): Secondary | ICD-10-CM | POA: Diagnosis not present

## 2020-04-29 DIAGNOSIS — M150 Primary generalized (osteo)arthritis: Secondary | ICD-10-CM | POA: Diagnosis not present

## 2020-04-29 DIAGNOSIS — L97521 Non-pressure chronic ulcer of other part of left foot limited to breakdown of skin: Secondary | ICD-10-CM | POA: Diagnosis not present

## 2020-05-03 DIAGNOSIS — L97322 Non-pressure chronic ulcer of left ankle with fat layer exposed: Secondary | ICD-10-CM | POA: Diagnosis not present

## 2020-05-03 DIAGNOSIS — L97813 Non-pressure chronic ulcer of other part of right lower leg with necrosis of muscle: Secondary | ICD-10-CM | POA: Diagnosis not present

## 2020-05-03 DIAGNOSIS — L97521 Non-pressure chronic ulcer of other part of left foot limited to breakdown of skin: Secondary | ICD-10-CM | POA: Diagnosis not present

## 2020-05-03 DIAGNOSIS — M150 Primary generalized (osteo)arthritis: Secondary | ICD-10-CM | POA: Diagnosis not present

## 2020-05-03 DIAGNOSIS — I10 Essential (primary) hypertension: Secondary | ICD-10-CM | POA: Diagnosis not present

## 2020-05-03 DIAGNOSIS — I872 Venous insufficiency (chronic) (peripheral): Secondary | ICD-10-CM | POA: Diagnosis not present

## 2020-05-06 DIAGNOSIS — I872 Venous insufficiency (chronic) (peripheral): Secondary | ICD-10-CM | POA: Diagnosis not present

## 2020-05-06 DIAGNOSIS — L97521 Non-pressure chronic ulcer of other part of left foot limited to breakdown of skin: Secondary | ICD-10-CM | POA: Diagnosis not present

## 2020-05-06 DIAGNOSIS — I10 Essential (primary) hypertension: Secondary | ICD-10-CM | POA: Diagnosis not present

## 2020-05-06 DIAGNOSIS — L97813 Non-pressure chronic ulcer of other part of right lower leg with necrosis of muscle: Secondary | ICD-10-CM | POA: Diagnosis not present

## 2020-05-06 DIAGNOSIS — L97322 Non-pressure chronic ulcer of left ankle with fat layer exposed: Secondary | ICD-10-CM | POA: Diagnosis not present

## 2020-05-06 DIAGNOSIS — M150 Primary generalized (osteo)arthritis: Secondary | ICD-10-CM | POA: Diagnosis not present

## 2020-05-10 DIAGNOSIS — L97521 Non-pressure chronic ulcer of other part of left foot limited to breakdown of skin: Secondary | ICD-10-CM | POA: Diagnosis not present

## 2020-05-10 DIAGNOSIS — L97813 Non-pressure chronic ulcer of other part of right lower leg with necrosis of muscle: Secondary | ICD-10-CM | POA: Diagnosis not present

## 2020-05-10 DIAGNOSIS — M150 Primary generalized (osteo)arthritis: Secondary | ICD-10-CM | POA: Diagnosis not present

## 2020-05-10 DIAGNOSIS — I10 Essential (primary) hypertension: Secondary | ICD-10-CM | POA: Diagnosis not present

## 2020-05-10 DIAGNOSIS — L97322 Non-pressure chronic ulcer of left ankle with fat layer exposed: Secondary | ICD-10-CM | POA: Diagnosis not present

## 2020-05-10 DIAGNOSIS — I872 Venous insufficiency (chronic) (peripheral): Secondary | ICD-10-CM | POA: Diagnosis not present

## 2020-05-12 DIAGNOSIS — G8929 Other chronic pain: Secondary | ICD-10-CM | POA: Diagnosis not present

## 2020-05-12 DIAGNOSIS — L97529 Non-pressure chronic ulcer of other part of left foot with unspecified severity: Secondary | ICD-10-CM | POA: Diagnosis not present

## 2020-05-12 DIAGNOSIS — I890 Lymphedema, not elsewhere classified: Secondary | ICD-10-CM | POA: Diagnosis not present

## 2020-05-12 DIAGNOSIS — L97919 Non-pressure chronic ulcer of unspecified part of right lower leg with unspecified severity: Secondary | ICD-10-CM | POA: Diagnosis not present

## 2020-05-12 DIAGNOSIS — M810 Age-related osteoporosis without current pathological fracture: Secondary | ICD-10-CM | POA: Diagnosis not present

## 2020-05-12 DIAGNOSIS — L97329 Non-pressure chronic ulcer of left ankle with unspecified severity: Secondary | ICD-10-CM | POA: Diagnosis not present

## 2020-05-12 DIAGNOSIS — L97521 Non-pressure chronic ulcer of other part of left foot limited to breakdown of skin: Secondary | ICD-10-CM | POA: Diagnosis not present

## 2020-05-12 DIAGNOSIS — I10 Essential (primary) hypertension: Secondary | ICD-10-CM | POA: Diagnosis not present

## 2020-05-17 DIAGNOSIS — I872 Venous insufficiency (chronic) (peripheral): Secondary | ICD-10-CM | POA: Diagnosis not present

## 2020-05-17 DIAGNOSIS — M150 Primary generalized (osteo)arthritis: Secondary | ICD-10-CM | POA: Diagnosis not present

## 2020-05-17 DIAGNOSIS — I10 Essential (primary) hypertension: Secondary | ICD-10-CM | POA: Diagnosis not present

## 2020-05-17 DIAGNOSIS — L97322 Non-pressure chronic ulcer of left ankle with fat layer exposed: Secondary | ICD-10-CM | POA: Diagnosis not present

## 2020-05-17 DIAGNOSIS — L97813 Non-pressure chronic ulcer of other part of right lower leg with necrosis of muscle: Secondary | ICD-10-CM | POA: Diagnosis not present

## 2020-05-17 DIAGNOSIS — L97521 Non-pressure chronic ulcer of other part of left foot limited to breakdown of skin: Secondary | ICD-10-CM | POA: Diagnosis not present

## 2020-05-18 DIAGNOSIS — L602 Onychogryphosis: Secondary | ICD-10-CM | POA: Diagnosis not present

## 2020-05-18 DIAGNOSIS — M79674 Pain in right toe(s): Secondary | ICD-10-CM | POA: Diagnosis not present

## 2020-05-18 DIAGNOSIS — B351 Tinea unguium: Secondary | ICD-10-CM | POA: Diagnosis not present

## 2020-05-18 DIAGNOSIS — M79675 Pain in left toe(s): Secondary | ICD-10-CM | POA: Diagnosis not present

## 2020-05-18 DIAGNOSIS — L600 Ingrowing nail: Secondary | ICD-10-CM | POA: Diagnosis not present

## 2020-05-18 DIAGNOSIS — L84 Corns and callosities: Secondary | ICD-10-CM | POA: Diagnosis not present

## 2020-05-18 DIAGNOSIS — I739 Peripheral vascular disease, unspecified: Secondary | ICD-10-CM | POA: Diagnosis not present

## 2020-05-19 DIAGNOSIS — M170 Bilateral primary osteoarthritis of knee: Secondary | ICD-10-CM | POA: Diagnosis not present

## 2020-05-19 DIAGNOSIS — L97521 Non-pressure chronic ulcer of other part of left foot limited to breakdown of skin: Secondary | ICD-10-CM | POA: Diagnosis not present

## 2020-05-19 DIAGNOSIS — M150 Primary generalized (osteo)arthritis: Secondary | ICD-10-CM | POA: Diagnosis not present

## 2020-05-19 DIAGNOSIS — I872 Venous insufficiency (chronic) (peripheral): Secondary | ICD-10-CM | POA: Diagnosis not present

## 2020-05-19 DIAGNOSIS — M21061 Valgus deformity, not elsewhere classified, right knee: Secondary | ICD-10-CM | POA: Diagnosis not present

## 2020-05-19 DIAGNOSIS — L97322 Non-pressure chronic ulcer of left ankle with fat layer exposed: Secondary | ICD-10-CM | POA: Diagnosis not present

## 2020-05-19 DIAGNOSIS — L97813 Non-pressure chronic ulcer of other part of right lower leg with necrosis of muscle: Secondary | ICD-10-CM | POA: Diagnosis not present

## 2020-05-19 DIAGNOSIS — I10 Essential (primary) hypertension: Secondary | ICD-10-CM | POA: Diagnosis not present

## 2020-05-20 DIAGNOSIS — L97813 Non-pressure chronic ulcer of other part of right lower leg with necrosis of muscle: Secondary | ICD-10-CM | POA: Diagnosis not present

## 2020-05-20 DIAGNOSIS — L97521 Non-pressure chronic ulcer of other part of left foot limited to breakdown of skin: Secondary | ICD-10-CM | POA: Diagnosis not present

## 2020-05-20 DIAGNOSIS — I10 Essential (primary) hypertension: Secondary | ICD-10-CM | POA: Diagnosis not present

## 2020-05-20 DIAGNOSIS — L97322 Non-pressure chronic ulcer of left ankle with fat layer exposed: Secondary | ICD-10-CM | POA: Diagnosis not present

## 2020-05-20 DIAGNOSIS — I872 Venous insufficiency (chronic) (peripheral): Secondary | ICD-10-CM | POA: Diagnosis not present

## 2020-05-20 DIAGNOSIS — M150 Primary generalized (osteo)arthritis: Secondary | ICD-10-CM | POA: Diagnosis not present

## 2020-05-24 DIAGNOSIS — M150 Primary generalized (osteo)arthritis: Secondary | ICD-10-CM | POA: Diagnosis not present

## 2020-05-24 DIAGNOSIS — L97813 Non-pressure chronic ulcer of other part of right lower leg with necrosis of muscle: Secondary | ICD-10-CM | POA: Diagnosis not present

## 2020-05-24 DIAGNOSIS — I872 Venous insufficiency (chronic) (peripheral): Secondary | ICD-10-CM | POA: Diagnosis not present

## 2020-05-24 DIAGNOSIS — L97521 Non-pressure chronic ulcer of other part of left foot limited to breakdown of skin: Secondary | ICD-10-CM | POA: Diagnosis not present

## 2020-05-24 DIAGNOSIS — I10 Essential (primary) hypertension: Secondary | ICD-10-CM | POA: Diagnosis not present

## 2020-05-24 DIAGNOSIS — L97322 Non-pressure chronic ulcer of left ankle with fat layer exposed: Secondary | ICD-10-CM | POA: Diagnosis not present

## 2020-05-27 DIAGNOSIS — L97813 Non-pressure chronic ulcer of other part of right lower leg with necrosis of muscle: Secondary | ICD-10-CM | POA: Diagnosis not present

## 2020-05-27 DIAGNOSIS — M150 Primary generalized (osteo)arthritis: Secondary | ICD-10-CM | POA: Diagnosis not present

## 2020-05-27 DIAGNOSIS — I872 Venous insufficiency (chronic) (peripheral): Secondary | ICD-10-CM | POA: Diagnosis not present

## 2020-05-27 DIAGNOSIS — I10 Essential (primary) hypertension: Secondary | ICD-10-CM | POA: Diagnosis not present

## 2020-05-27 DIAGNOSIS — L97521 Non-pressure chronic ulcer of other part of left foot limited to breakdown of skin: Secondary | ICD-10-CM | POA: Diagnosis not present

## 2020-05-27 DIAGNOSIS — L97322 Non-pressure chronic ulcer of left ankle with fat layer exposed: Secondary | ICD-10-CM | POA: Diagnosis not present

## 2020-05-31 DIAGNOSIS — L97813 Non-pressure chronic ulcer of other part of right lower leg with necrosis of muscle: Secondary | ICD-10-CM | POA: Diagnosis not present

## 2020-05-31 DIAGNOSIS — M150 Primary generalized (osteo)arthritis: Secondary | ICD-10-CM | POA: Diagnosis not present

## 2020-05-31 DIAGNOSIS — L97322 Non-pressure chronic ulcer of left ankle with fat layer exposed: Secondary | ICD-10-CM | POA: Diagnosis not present

## 2020-05-31 DIAGNOSIS — I872 Venous insufficiency (chronic) (peripheral): Secondary | ICD-10-CM | POA: Diagnosis not present

## 2020-05-31 DIAGNOSIS — I10 Essential (primary) hypertension: Secondary | ICD-10-CM | POA: Diagnosis not present

## 2020-05-31 DIAGNOSIS — L97521 Non-pressure chronic ulcer of other part of left foot limited to breakdown of skin: Secondary | ICD-10-CM | POA: Diagnosis not present

## 2020-06-02 DIAGNOSIS — I10 Essential (primary) hypertension: Secondary | ICD-10-CM | POA: Diagnosis not present

## 2020-06-02 DIAGNOSIS — I83013 Varicose veins of right lower extremity with ulcer of ankle: Secondary | ICD-10-CM | POA: Diagnosis not present

## 2020-06-02 DIAGNOSIS — M810 Age-related osteoporosis without current pathological fracture: Secondary | ICD-10-CM | POA: Diagnosis not present

## 2020-06-02 DIAGNOSIS — M21541 Acquired clubfoot, right foot: Secondary | ICD-10-CM | POA: Diagnosis not present

## 2020-06-02 DIAGNOSIS — I96 Gangrene, not elsewhere classified: Secondary | ICD-10-CM | POA: Diagnosis not present

## 2020-06-02 DIAGNOSIS — M170 Bilateral primary osteoarthritis of knee: Secondary | ICD-10-CM | POA: Diagnosis not present

## 2020-06-02 DIAGNOSIS — L97512 Non-pressure chronic ulcer of other part of right foot with fat layer exposed: Secondary | ICD-10-CM | POA: Diagnosis not present

## 2020-06-02 DIAGNOSIS — I83015 Varicose veins of right lower extremity with ulcer other part of foot: Secondary | ICD-10-CM | POA: Diagnosis not present

## 2020-06-02 DIAGNOSIS — I83019 Varicose veins of right lower extremity with ulcer of unspecified site: Secondary | ICD-10-CM | POA: Diagnosis not present

## 2020-06-02 DIAGNOSIS — I890 Lymphedema, not elsewhere classified: Secondary | ICD-10-CM | POA: Diagnosis not present

## 2020-06-02 DIAGNOSIS — L97311 Non-pressure chronic ulcer of right ankle limited to breakdown of skin: Secondary | ICD-10-CM | POA: Diagnosis not present

## 2020-06-02 DIAGNOSIS — L97929 Non-pressure chronic ulcer of unspecified part of left lower leg with unspecified severity: Secondary | ICD-10-CM | POA: Diagnosis not present

## 2020-06-02 DIAGNOSIS — I83029 Varicose veins of left lower extremity with ulcer of unspecified site: Secondary | ICD-10-CM | POA: Diagnosis not present

## 2020-06-02 DIAGNOSIS — M25371 Other instability, right ankle: Secondary | ICD-10-CM | POA: Diagnosis not present

## 2020-06-02 DIAGNOSIS — L97521 Non-pressure chronic ulcer of other part of left foot limited to breakdown of skin: Secondary | ICD-10-CM | POA: Diagnosis not present

## 2020-06-02 DIAGNOSIS — L97919 Non-pressure chronic ulcer of unspecified part of right lower leg with unspecified severity: Secondary | ICD-10-CM | POA: Diagnosis not present

## 2020-06-07 DIAGNOSIS — I872 Venous insufficiency (chronic) (peripheral): Secondary | ICD-10-CM | POA: Diagnosis not present

## 2020-06-07 DIAGNOSIS — L97521 Non-pressure chronic ulcer of other part of left foot limited to breakdown of skin: Secondary | ICD-10-CM | POA: Diagnosis not present

## 2020-06-07 DIAGNOSIS — L97322 Non-pressure chronic ulcer of left ankle with fat layer exposed: Secondary | ICD-10-CM | POA: Diagnosis not present

## 2020-06-07 DIAGNOSIS — I10 Essential (primary) hypertension: Secondary | ICD-10-CM | POA: Diagnosis not present

## 2020-06-07 DIAGNOSIS — L97813 Non-pressure chronic ulcer of other part of right lower leg with necrosis of muscle: Secondary | ICD-10-CM | POA: Diagnosis not present

## 2020-06-07 DIAGNOSIS — M150 Primary generalized (osteo)arthritis: Secondary | ICD-10-CM | POA: Diagnosis not present

## 2020-06-10 DIAGNOSIS — L97813 Non-pressure chronic ulcer of other part of right lower leg with necrosis of muscle: Secondary | ICD-10-CM | POA: Diagnosis not present

## 2020-06-10 DIAGNOSIS — L97322 Non-pressure chronic ulcer of left ankle with fat layer exposed: Secondary | ICD-10-CM | POA: Diagnosis not present

## 2020-06-10 DIAGNOSIS — I10 Essential (primary) hypertension: Secondary | ICD-10-CM | POA: Diagnosis not present

## 2020-06-10 DIAGNOSIS — L97521 Non-pressure chronic ulcer of other part of left foot limited to breakdown of skin: Secondary | ICD-10-CM | POA: Diagnosis not present

## 2020-06-10 DIAGNOSIS — I872 Venous insufficiency (chronic) (peripheral): Secondary | ICD-10-CM | POA: Diagnosis not present

## 2020-06-10 DIAGNOSIS — M150 Primary generalized (osteo)arthritis: Secondary | ICD-10-CM | POA: Diagnosis not present

## 2020-06-14 DIAGNOSIS — M150 Primary generalized (osteo)arthritis: Secondary | ICD-10-CM | POA: Diagnosis not present

## 2020-06-14 DIAGNOSIS — L97813 Non-pressure chronic ulcer of other part of right lower leg with necrosis of muscle: Secondary | ICD-10-CM | POA: Diagnosis not present

## 2020-06-14 DIAGNOSIS — I10 Essential (primary) hypertension: Secondary | ICD-10-CM | POA: Diagnosis not present

## 2020-06-14 DIAGNOSIS — I872 Venous insufficiency (chronic) (peripheral): Secondary | ICD-10-CM | POA: Diagnosis not present

## 2020-06-14 DIAGNOSIS — L97322 Non-pressure chronic ulcer of left ankle with fat layer exposed: Secondary | ICD-10-CM | POA: Diagnosis not present

## 2020-06-14 DIAGNOSIS — L97521 Non-pressure chronic ulcer of other part of left foot limited to breakdown of skin: Secondary | ICD-10-CM | POA: Diagnosis not present

## 2020-06-17 DIAGNOSIS — L97521 Non-pressure chronic ulcer of other part of left foot limited to breakdown of skin: Secondary | ICD-10-CM | POA: Diagnosis not present

## 2020-06-17 DIAGNOSIS — L97322 Non-pressure chronic ulcer of left ankle with fat layer exposed: Secondary | ICD-10-CM | POA: Diagnosis not present

## 2020-06-17 DIAGNOSIS — I10 Essential (primary) hypertension: Secondary | ICD-10-CM | POA: Diagnosis not present

## 2020-06-17 DIAGNOSIS — I872 Venous insufficiency (chronic) (peripheral): Secondary | ICD-10-CM | POA: Diagnosis not present

## 2020-06-17 DIAGNOSIS — M150 Primary generalized (osteo)arthritis: Secondary | ICD-10-CM | POA: Diagnosis not present

## 2020-06-17 DIAGNOSIS — L97813 Non-pressure chronic ulcer of other part of right lower leg with necrosis of muscle: Secondary | ICD-10-CM | POA: Diagnosis not present

## 2020-06-18 DIAGNOSIS — I872 Venous insufficiency (chronic) (peripheral): Secondary | ICD-10-CM | POA: Diagnosis not present

## 2020-06-18 DIAGNOSIS — L97322 Non-pressure chronic ulcer of left ankle with fat layer exposed: Secondary | ICD-10-CM | POA: Diagnosis not present

## 2020-06-18 DIAGNOSIS — I10 Essential (primary) hypertension: Secondary | ICD-10-CM | POA: Diagnosis not present

## 2020-06-18 DIAGNOSIS — L97521 Non-pressure chronic ulcer of other part of left foot limited to breakdown of skin: Secondary | ICD-10-CM | POA: Diagnosis not present

## 2020-06-18 DIAGNOSIS — L97813 Non-pressure chronic ulcer of other part of right lower leg with necrosis of muscle: Secondary | ICD-10-CM | POA: Diagnosis not present

## 2020-06-18 DIAGNOSIS — M150 Primary generalized (osteo)arthritis: Secondary | ICD-10-CM | POA: Diagnosis not present

## 2020-06-21 DIAGNOSIS — L97521 Non-pressure chronic ulcer of other part of left foot limited to breakdown of skin: Secondary | ICD-10-CM | POA: Diagnosis not present

## 2020-06-21 DIAGNOSIS — L97322 Non-pressure chronic ulcer of left ankle with fat layer exposed: Secondary | ICD-10-CM | POA: Diagnosis not present

## 2020-06-21 DIAGNOSIS — L97813 Non-pressure chronic ulcer of other part of right lower leg with necrosis of muscle: Secondary | ICD-10-CM | POA: Diagnosis not present

## 2020-06-21 DIAGNOSIS — I872 Venous insufficiency (chronic) (peripheral): Secondary | ICD-10-CM | POA: Diagnosis not present

## 2020-06-21 DIAGNOSIS — I10 Essential (primary) hypertension: Secondary | ICD-10-CM | POA: Diagnosis not present

## 2020-06-21 DIAGNOSIS — M150 Primary generalized (osteo)arthritis: Secondary | ICD-10-CM | POA: Diagnosis not present

## 2020-06-22 DIAGNOSIS — I10 Essential (primary) hypertension: Secondary | ICD-10-CM | POA: Diagnosis not present

## 2020-06-22 DIAGNOSIS — M170 Bilateral primary osteoarthritis of knee: Secondary | ICD-10-CM | POA: Diagnosis not present

## 2020-06-23 DIAGNOSIS — G8929 Other chronic pain: Secondary | ICD-10-CM | POA: Diagnosis not present

## 2020-06-23 DIAGNOSIS — L97522 Non-pressure chronic ulcer of other part of left foot with fat layer exposed: Secondary | ICD-10-CM | POA: Diagnosis not present

## 2020-06-23 DIAGNOSIS — L97311 Non-pressure chronic ulcer of right ankle limited to breakdown of skin: Secondary | ICD-10-CM | POA: Diagnosis not present

## 2020-06-23 DIAGNOSIS — I890 Lymphedema, not elsewhere classified: Secondary | ICD-10-CM | POA: Diagnosis not present

## 2020-06-23 DIAGNOSIS — Z2239 Carrier of other specified bacterial diseases: Secondary | ICD-10-CM | POA: Diagnosis not present

## 2020-06-23 DIAGNOSIS — L97521 Non-pressure chronic ulcer of other part of left foot limited to breakdown of skin: Secondary | ICD-10-CM | POA: Diagnosis not present

## 2020-06-23 DIAGNOSIS — I10 Essential (primary) hypertension: Secondary | ICD-10-CM | POA: Diagnosis not present

## 2020-06-28 DIAGNOSIS — I872 Venous insufficiency (chronic) (peripheral): Secondary | ICD-10-CM | POA: Diagnosis not present

## 2020-06-28 DIAGNOSIS — M150 Primary generalized (osteo)arthritis: Secondary | ICD-10-CM | POA: Diagnosis not present

## 2020-06-28 DIAGNOSIS — L97813 Non-pressure chronic ulcer of other part of right lower leg with necrosis of muscle: Secondary | ICD-10-CM | POA: Diagnosis not present

## 2020-06-28 DIAGNOSIS — I10 Essential (primary) hypertension: Secondary | ICD-10-CM | POA: Diagnosis not present

## 2020-06-28 DIAGNOSIS — L97322 Non-pressure chronic ulcer of left ankle with fat layer exposed: Secondary | ICD-10-CM | POA: Diagnosis not present

## 2020-06-28 DIAGNOSIS — L97521 Non-pressure chronic ulcer of other part of left foot limited to breakdown of skin: Secondary | ICD-10-CM | POA: Diagnosis not present

## 2020-07-01 DIAGNOSIS — I872 Venous insufficiency (chronic) (peripheral): Secondary | ICD-10-CM | POA: Diagnosis not present

## 2020-07-01 DIAGNOSIS — M150 Primary generalized (osteo)arthritis: Secondary | ICD-10-CM | POA: Diagnosis not present

## 2020-07-01 DIAGNOSIS — L97521 Non-pressure chronic ulcer of other part of left foot limited to breakdown of skin: Secondary | ICD-10-CM | POA: Diagnosis not present

## 2020-07-01 DIAGNOSIS — L97813 Non-pressure chronic ulcer of other part of right lower leg with necrosis of muscle: Secondary | ICD-10-CM | POA: Diagnosis not present

## 2020-07-01 DIAGNOSIS — L97322 Non-pressure chronic ulcer of left ankle with fat layer exposed: Secondary | ICD-10-CM | POA: Diagnosis not present

## 2020-07-01 DIAGNOSIS — I10 Essential (primary) hypertension: Secondary | ICD-10-CM | POA: Diagnosis not present

## 2020-07-05 DIAGNOSIS — I10 Essential (primary) hypertension: Secondary | ICD-10-CM | POA: Diagnosis not present

## 2020-07-05 DIAGNOSIS — L97521 Non-pressure chronic ulcer of other part of left foot limited to breakdown of skin: Secondary | ICD-10-CM | POA: Diagnosis not present

## 2020-07-05 DIAGNOSIS — I872 Venous insufficiency (chronic) (peripheral): Secondary | ICD-10-CM | POA: Diagnosis not present

## 2020-07-05 DIAGNOSIS — L97813 Non-pressure chronic ulcer of other part of right lower leg with necrosis of muscle: Secondary | ICD-10-CM | POA: Diagnosis not present

## 2020-07-05 DIAGNOSIS — M150 Primary generalized (osteo)arthritis: Secondary | ICD-10-CM | POA: Diagnosis not present

## 2020-07-05 DIAGNOSIS — L97322 Non-pressure chronic ulcer of left ankle with fat layer exposed: Secondary | ICD-10-CM | POA: Diagnosis not present

## 2020-07-08 DIAGNOSIS — M150 Primary generalized (osteo)arthritis: Secondary | ICD-10-CM | POA: Diagnosis not present

## 2020-07-08 DIAGNOSIS — I872 Venous insufficiency (chronic) (peripheral): Secondary | ICD-10-CM | POA: Diagnosis not present

## 2020-07-08 DIAGNOSIS — L97521 Non-pressure chronic ulcer of other part of left foot limited to breakdown of skin: Secondary | ICD-10-CM | POA: Diagnosis not present

## 2020-07-08 DIAGNOSIS — L97813 Non-pressure chronic ulcer of other part of right lower leg with necrosis of muscle: Secondary | ICD-10-CM | POA: Diagnosis not present

## 2020-07-08 DIAGNOSIS — L97322 Non-pressure chronic ulcer of left ankle with fat layer exposed: Secondary | ICD-10-CM | POA: Diagnosis not present

## 2020-07-08 DIAGNOSIS — I10 Essential (primary) hypertension: Secondary | ICD-10-CM | POA: Diagnosis not present

## 2020-07-12 DIAGNOSIS — I872 Venous insufficiency (chronic) (peripheral): Secondary | ICD-10-CM | POA: Diagnosis not present

## 2020-07-12 DIAGNOSIS — L97813 Non-pressure chronic ulcer of other part of right lower leg with necrosis of muscle: Secondary | ICD-10-CM | POA: Diagnosis not present

## 2020-07-12 DIAGNOSIS — I10 Essential (primary) hypertension: Secondary | ICD-10-CM | POA: Diagnosis not present

## 2020-07-12 DIAGNOSIS — L97521 Non-pressure chronic ulcer of other part of left foot limited to breakdown of skin: Secondary | ICD-10-CM | POA: Diagnosis not present

## 2020-07-12 DIAGNOSIS — L97322 Non-pressure chronic ulcer of left ankle with fat layer exposed: Secondary | ICD-10-CM | POA: Diagnosis not present

## 2020-07-12 DIAGNOSIS — M150 Primary generalized (osteo)arthritis: Secondary | ICD-10-CM | POA: Diagnosis not present

## 2020-07-15 DIAGNOSIS — L97322 Non-pressure chronic ulcer of left ankle with fat layer exposed: Secondary | ICD-10-CM | POA: Diagnosis not present

## 2020-07-15 DIAGNOSIS — M150 Primary generalized (osteo)arthritis: Secondary | ICD-10-CM | POA: Diagnosis not present

## 2020-07-15 DIAGNOSIS — I872 Venous insufficiency (chronic) (peripheral): Secondary | ICD-10-CM | POA: Diagnosis not present

## 2020-07-15 DIAGNOSIS — I10 Essential (primary) hypertension: Secondary | ICD-10-CM | POA: Diagnosis not present

## 2020-07-15 DIAGNOSIS — L97813 Non-pressure chronic ulcer of other part of right lower leg with necrosis of muscle: Secondary | ICD-10-CM | POA: Diagnosis not present

## 2020-07-15 DIAGNOSIS — L97521 Non-pressure chronic ulcer of other part of left foot limited to breakdown of skin: Secondary | ICD-10-CM | POA: Diagnosis not present

## 2020-07-18 DIAGNOSIS — I872 Venous insufficiency (chronic) (peripheral): Secondary | ICD-10-CM | POA: Diagnosis not present

## 2020-07-18 DIAGNOSIS — M150 Primary generalized (osteo)arthritis: Secondary | ICD-10-CM | POA: Diagnosis not present

## 2020-07-18 DIAGNOSIS — L97322 Non-pressure chronic ulcer of left ankle with fat layer exposed: Secondary | ICD-10-CM | POA: Diagnosis not present

## 2020-07-18 DIAGNOSIS — L97521 Non-pressure chronic ulcer of other part of left foot limited to breakdown of skin: Secondary | ICD-10-CM | POA: Diagnosis not present

## 2020-07-18 DIAGNOSIS — L97813 Non-pressure chronic ulcer of other part of right lower leg with necrosis of muscle: Secondary | ICD-10-CM | POA: Diagnosis not present

## 2020-07-18 DIAGNOSIS — I10 Essential (primary) hypertension: Secondary | ICD-10-CM | POA: Diagnosis not present

## 2020-07-19 DIAGNOSIS — I872 Venous insufficiency (chronic) (peripheral): Secondary | ICD-10-CM | POA: Diagnosis not present

## 2020-07-19 DIAGNOSIS — M150 Primary generalized (osteo)arthritis: Secondary | ICD-10-CM | POA: Diagnosis not present

## 2020-07-19 DIAGNOSIS — L97322 Non-pressure chronic ulcer of left ankle with fat layer exposed: Secondary | ICD-10-CM | POA: Diagnosis not present

## 2020-07-19 DIAGNOSIS — I10 Essential (primary) hypertension: Secondary | ICD-10-CM | POA: Diagnosis not present

## 2020-07-19 DIAGNOSIS — L97521 Non-pressure chronic ulcer of other part of left foot limited to breakdown of skin: Secondary | ICD-10-CM | POA: Diagnosis not present

## 2020-07-19 DIAGNOSIS — L97813 Non-pressure chronic ulcer of other part of right lower leg with necrosis of muscle: Secondary | ICD-10-CM | POA: Diagnosis not present

## 2020-07-21 DIAGNOSIS — S91302A Unspecified open wound, left foot, initial encounter: Secondary | ICD-10-CM | POA: Diagnosis not present

## 2020-07-21 DIAGNOSIS — S91002A Unspecified open wound, left ankle, initial encounter: Secondary | ICD-10-CM | POA: Diagnosis not present

## 2020-07-21 DIAGNOSIS — M79671 Pain in right foot: Secondary | ICD-10-CM | POA: Diagnosis not present

## 2020-07-21 DIAGNOSIS — M25571 Pain in right ankle and joints of right foot: Secondary | ICD-10-CM | POA: Diagnosis not present

## 2020-07-21 DIAGNOSIS — M25572 Pain in left ankle and joints of left foot: Secondary | ICD-10-CM | POA: Diagnosis not present

## 2020-07-21 DIAGNOSIS — M810 Age-related osteoporosis without current pathological fracture: Secondary | ICD-10-CM | POA: Diagnosis not present

## 2020-07-21 DIAGNOSIS — M25371 Other instability, right ankle: Secondary | ICD-10-CM | POA: Diagnosis not present

## 2020-07-21 DIAGNOSIS — G8929 Other chronic pain: Secondary | ICD-10-CM | POA: Diagnosis not present

## 2020-07-21 DIAGNOSIS — Z87891 Personal history of nicotine dependence: Secondary | ICD-10-CM | POA: Diagnosis not present

## 2020-07-21 DIAGNOSIS — M79672 Pain in left foot: Secondary | ICD-10-CM | POA: Diagnosis not present

## 2020-07-21 DIAGNOSIS — M2011 Hallux valgus (acquired), right foot: Secondary | ICD-10-CM | POA: Diagnosis not present

## 2020-07-21 DIAGNOSIS — S91302D Unspecified open wound, left foot, subsequent encounter: Secondary | ICD-10-CM | POA: Diagnosis not present

## 2020-07-21 DIAGNOSIS — M19071 Primary osteoarthritis, right ankle and foot: Secondary | ICD-10-CM | POA: Diagnosis not present

## 2020-07-21 DIAGNOSIS — M21541 Acquired clubfoot, right foot: Secondary | ICD-10-CM | POA: Diagnosis not present

## 2020-07-21 DIAGNOSIS — R600 Localized edema: Secondary | ICD-10-CM | POA: Diagnosis not present

## 2020-07-21 DIAGNOSIS — Z79899 Other long term (current) drug therapy: Secondary | ICD-10-CM | POA: Diagnosis not present

## 2020-07-21 DIAGNOSIS — S91002D Unspecified open wound, left ankle, subsequent encounter: Secondary | ICD-10-CM | POA: Diagnosis not present

## 2020-07-22 DIAGNOSIS — S91002S Unspecified open wound, left ankle, sequela: Secondary | ICD-10-CM | POA: Diagnosis not present

## 2020-07-22 DIAGNOSIS — M79672 Pain in left foot: Secondary | ICD-10-CM | POA: Diagnosis not present

## 2020-07-22 DIAGNOSIS — M25371 Other instability, right ankle: Secondary | ICD-10-CM | POA: Diagnosis not present

## 2020-07-27 DIAGNOSIS — L97813 Non-pressure chronic ulcer of other part of right lower leg with necrosis of muscle: Secondary | ICD-10-CM | POA: Diagnosis not present

## 2020-07-27 DIAGNOSIS — I872 Venous insufficiency (chronic) (peripheral): Secondary | ICD-10-CM | POA: Diagnosis not present

## 2020-07-27 DIAGNOSIS — M150 Primary generalized (osteo)arthritis: Secondary | ICD-10-CM | POA: Diagnosis not present

## 2020-07-27 DIAGNOSIS — L97322 Non-pressure chronic ulcer of left ankle with fat layer exposed: Secondary | ICD-10-CM | POA: Diagnosis not present

## 2020-07-27 DIAGNOSIS — I10 Essential (primary) hypertension: Secondary | ICD-10-CM | POA: Diagnosis not present

## 2020-07-27 DIAGNOSIS — L97521 Non-pressure chronic ulcer of other part of left foot limited to breakdown of skin: Secondary | ICD-10-CM | POA: Diagnosis not present

## 2020-07-29 DIAGNOSIS — I10 Essential (primary) hypertension: Secondary | ICD-10-CM | POA: Diagnosis not present

## 2020-07-29 DIAGNOSIS — L97521 Non-pressure chronic ulcer of other part of left foot limited to breakdown of skin: Secondary | ICD-10-CM | POA: Diagnosis not present

## 2020-07-29 DIAGNOSIS — I872 Venous insufficiency (chronic) (peripheral): Secondary | ICD-10-CM | POA: Diagnosis not present

## 2020-07-29 DIAGNOSIS — M150 Primary generalized (osteo)arthritis: Secondary | ICD-10-CM | POA: Diagnosis not present

## 2020-07-29 DIAGNOSIS — L97813 Non-pressure chronic ulcer of other part of right lower leg with necrosis of muscle: Secondary | ICD-10-CM | POA: Diagnosis not present

## 2020-07-29 DIAGNOSIS — L97322 Non-pressure chronic ulcer of left ankle with fat layer exposed: Secondary | ICD-10-CM | POA: Diagnosis not present

## 2020-08-02 DIAGNOSIS — I10 Essential (primary) hypertension: Secondary | ICD-10-CM | POA: Diagnosis not present

## 2020-08-02 DIAGNOSIS — L97813 Non-pressure chronic ulcer of other part of right lower leg with necrosis of muscle: Secondary | ICD-10-CM | POA: Diagnosis not present

## 2020-08-02 DIAGNOSIS — M150 Primary generalized (osteo)arthritis: Secondary | ICD-10-CM | POA: Diagnosis not present

## 2020-08-02 DIAGNOSIS — L97322 Non-pressure chronic ulcer of left ankle with fat layer exposed: Secondary | ICD-10-CM | POA: Diagnosis not present

## 2020-08-02 DIAGNOSIS — L97521 Non-pressure chronic ulcer of other part of left foot limited to breakdown of skin: Secondary | ICD-10-CM | POA: Diagnosis not present

## 2020-08-02 DIAGNOSIS — I872 Venous insufficiency (chronic) (peripheral): Secondary | ICD-10-CM | POA: Diagnosis not present

## 2020-08-03 DIAGNOSIS — L602 Onychogryphosis: Secondary | ICD-10-CM | POA: Diagnosis not present

## 2020-08-03 DIAGNOSIS — M79675 Pain in left toe(s): Secondary | ICD-10-CM | POA: Diagnosis not present

## 2020-08-03 DIAGNOSIS — B351 Tinea unguium: Secondary | ICD-10-CM | POA: Diagnosis not present

## 2020-08-03 DIAGNOSIS — M79674 Pain in right toe(s): Secondary | ICD-10-CM | POA: Diagnosis not present

## 2020-08-03 DIAGNOSIS — L84 Corns and callosities: Secondary | ICD-10-CM | POA: Diagnosis not present

## 2020-08-03 DIAGNOSIS — I739 Peripheral vascular disease, unspecified: Secondary | ICD-10-CM | POA: Diagnosis not present

## 2020-08-03 DIAGNOSIS — L600 Ingrowing nail: Secondary | ICD-10-CM | POA: Diagnosis not present

## 2020-08-05 DIAGNOSIS — L97813 Non-pressure chronic ulcer of other part of right lower leg with necrosis of muscle: Secondary | ICD-10-CM | POA: Diagnosis not present

## 2020-08-05 DIAGNOSIS — L97521 Non-pressure chronic ulcer of other part of left foot limited to breakdown of skin: Secondary | ICD-10-CM | POA: Diagnosis not present

## 2020-08-05 DIAGNOSIS — I10 Essential (primary) hypertension: Secondary | ICD-10-CM | POA: Diagnosis not present

## 2020-08-05 DIAGNOSIS — L97322 Non-pressure chronic ulcer of left ankle with fat layer exposed: Secondary | ICD-10-CM | POA: Diagnosis not present

## 2020-08-05 DIAGNOSIS — M150 Primary generalized (osteo)arthritis: Secondary | ICD-10-CM | POA: Diagnosis not present

## 2020-08-05 DIAGNOSIS — I872 Venous insufficiency (chronic) (peripheral): Secondary | ICD-10-CM | POA: Diagnosis not present

## 2020-08-09 DIAGNOSIS — L97322 Non-pressure chronic ulcer of left ankle with fat layer exposed: Secondary | ICD-10-CM | POA: Diagnosis not present

## 2020-08-09 DIAGNOSIS — I10 Essential (primary) hypertension: Secondary | ICD-10-CM | POA: Diagnosis not present

## 2020-08-09 DIAGNOSIS — I872 Venous insufficiency (chronic) (peripheral): Secondary | ICD-10-CM | POA: Diagnosis not present

## 2020-08-09 DIAGNOSIS — M150 Primary generalized (osteo)arthritis: Secondary | ICD-10-CM | POA: Diagnosis not present

## 2020-08-09 DIAGNOSIS — L97813 Non-pressure chronic ulcer of other part of right lower leg with necrosis of muscle: Secondary | ICD-10-CM | POA: Diagnosis not present

## 2020-08-09 DIAGNOSIS — L97521 Non-pressure chronic ulcer of other part of left foot limited to breakdown of skin: Secondary | ICD-10-CM | POA: Diagnosis not present

## 2020-08-10 DIAGNOSIS — Z2239 Carrier of other specified bacterial diseases: Secondary | ICD-10-CM | POA: Diagnosis not present

## 2020-08-10 DIAGNOSIS — L97311 Non-pressure chronic ulcer of right ankle limited to breakdown of skin: Secondary | ICD-10-CM | POA: Diagnosis not present

## 2020-08-10 DIAGNOSIS — I890 Lymphedema, not elsewhere classified: Secondary | ICD-10-CM | POA: Diagnosis not present

## 2020-08-10 DIAGNOSIS — I872 Venous insufficiency (chronic) (peripheral): Secondary | ICD-10-CM | POA: Diagnosis not present

## 2020-08-10 DIAGNOSIS — L97521 Non-pressure chronic ulcer of other part of left foot limited to breakdown of skin: Secondary | ICD-10-CM | POA: Diagnosis not present

## 2020-08-12 DIAGNOSIS — M150 Primary generalized (osteo)arthritis: Secondary | ICD-10-CM | POA: Diagnosis not present

## 2020-08-12 DIAGNOSIS — L97521 Non-pressure chronic ulcer of other part of left foot limited to breakdown of skin: Secondary | ICD-10-CM | POA: Diagnosis not present

## 2020-08-12 DIAGNOSIS — I10 Essential (primary) hypertension: Secondary | ICD-10-CM | POA: Diagnosis not present

## 2020-08-12 DIAGNOSIS — I872 Venous insufficiency (chronic) (peripheral): Secondary | ICD-10-CM | POA: Diagnosis not present

## 2020-08-12 DIAGNOSIS — L97813 Non-pressure chronic ulcer of other part of right lower leg with necrosis of muscle: Secondary | ICD-10-CM | POA: Diagnosis not present

## 2020-08-12 DIAGNOSIS — L97322 Non-pressure chronic ulcer of left ankle with fat layer exposed: Secondary | ICD-10-CM | POA: Diagnosis not present

## 2020-08-16 DIAGNOSIS — L97322 Non-pressure chronic ulcer of left ankle with fat layer exposed: Secondary | ICD-10-CM | POA: Diagnosis not present

## 2020-08-16 DIAGNOSIS — I10 Essential (primary) hypertension: Secondary | ICD-10-CM | POA: Diagnosis not present

## 2020-08-16 DIAGNOSIS — M150 Primary generalized (osteo)arthritis: Secondary | ICD-10-CM | POA: Diagnosis not present

## 2020-08-16 DIAGNOSIS — L97813 Non-pressure chronic ulcer of other part of right lower leg with necrosis of muscle: Secondary | ICD-10-CM | POA: Diagnosis not present

## 2020-08-16 DIAGNOSIS — L97521 Non-pressure chronic ulcer of other part of left foot limited to breakdown of skin: Secondary | ICD-10-CM | POA: Diagnosis not present

## 2020-08-16 DIAGNOSIS — I872 Venous insufficiency (chronic) (peripheral): Secondary | ICD-10-CM | POA: Diagnosis not present

## 2020-08-17 DIAGNOSIS — L97521 Non-pressure chronic ulcer of other part of left foot limited to breakdown of skin: Secondary | ICD-10-CM | POA: Diagnosis not present

## 2020-08-17 DIAGNOSIS — L97813 Non-pressure chronic ulcer of other part of right lower leg with necrosis of muscle: Secondary | ICD-10-CM | POA: Diagnosis not present

## 2020-08-17 DIAGNOSIS — I10 Essential (primary) hypertension: Secondary | ICD-10-CM | POA: Diagnosis not present

## 2020-08-17 DIAGNOSIS — M150 Primary generalized (osteo)arthritis: Secondary | ICD-10-CM | POA: Diagnosis not present

## 2020-08-17 DIAGNOSIS — L97322 Non-pressure chronic ulcer of left ankle with fat layer exposed: Secondary | ICD-10-CM | POA: Diagnosis not present

## 2020-08-17 DIAGNOSIS — I872 Venous insufficiency (chronic) (peripheral): Secondary | ICD-10-CM | POA: Diagnosis not present

## 2020-08-18 DIAGNOSIS — M170 Bilateral primary osteoarthritis of knee: Secondary | ICD-10-CM | POA: Diagnosis not present

## 2020-08-18 DIAGNOSIS — M21061 Valgus deformity, not elsewhere classified, right knee: Secondary | ICD-10-CM | POA: Diagnosis not present

## 2020-08-18 DIAGNOSIS — I83019 Varicose veins of right lower extremity with ulcer of unspecified site: Secondary | ICD-10-CM | POA: Diagnosis not present

## 2020-08-18 DIAGNOSIS — L97929 Non-pressure chronic ulcer of unspecified part of left lower leg with unspecified severity: Secondary | ICD-10-CM | POA: Diagnosis not present

## 2020-08-18 DIAGNOSIS — I83029 Varicose veins of left lower extremity with ulcer of unspecified site: Secondary | ICD-10-CM | POA: Diagnosis not present

## 2020-08-18 DIAGNOSIS — L97919 Non-pressure chronic ulcer of unspecified part of right lower leg with unspecified severity: Secondary | ICD-10-CM | POA: Diagnosis not present

## 2020-08-19 DIAGNOSIS — L97521 Non-pressure chronic ulcer of other part of left foot limited to breakdown of skin: Secondary | ICD-10-CM | POA: Diagnosis not present

## 2020-08-19 DIAGNOSIS — L97322 Non-pressure chronic ulcer of left ankle with fat layer exposed: Secondary | ICD-10-CM | POA: Diagnosis not present

## 2020-08-19 DIAGNOSIS — L97813 Non-pressure chronic ulcer of other part of right lower leg with necrosis of muscle: Secondary | ICD-10-CM | POA: Diagnosis not present

## 2020-08-19 DIAGNOSIS — I10 Essential (primary) hypertension: Secondary | ICD-10-CM | POA: Diagnosis not present

## 2020-08-19 DIAGNOSIS — M150 Primary generalized (osteo)arthritis: Secondary | ICD-10-CM | POA: Diagnosis not present

## 2020-08-19 DIAGNOSIS — I872 Venous insufficiency (chronic) (peripheral): Secondary | ICD-10-CM | POA: Diagnosis not present

## 2020-08-23 DIAGNOSIS — I872 Venous insufficiency (chronic) (peripheral): Secondary | ICD-10-CM | POA: Diagnosis not present

## 2020-08-23 DIAGNOSIS — L97521 Non-pressure chronic ulcer of other part of left foot limited to breakdown of skin: Secondary | ICD-10-CM | POA: Diagnosis not present

## 2020-08-23 DIAGNOSIS — I10 Essential (primary) hypertension: Secondary | ICD-10-CM | POA: Diagnosis not present

## 2020-08-23 DIAGNOSIS — L97322 Non-pressure chronic ulcer of left ankle with fat layer exposed: Secondary | ICD-10-CM | POA: Diagnosis not present

## 2020-08-23 DIAGNOSIS — L97813 Non-pressure chronic ulcer of other part of right lower leg with necrosis of muscle: Secondary | ICD-10-CM | POA: Diagnosis not present

## 2020-08-23 DIAGNOSIS — M150 Primary generalized (osteo)arthritis: Secondary | ICD-10-CM | POA: Diagnosis not present

## 2020-08-26 DIAGNOSIS — L97322 Non-pressure chronic ulcer of left ankle with fat layer exposed: Secondary | ICD-10-CM | POA: Diagnosis not present

## 2020-08-26 DIAGNOSIS — M150 Primary generalized (osteo)arthritis: Secondary | ICD-10-CM | POA: Diagnosis not present

## 2020-08-26 DIAGNOSIS — L97521 Non-pressure chronic ulcer of other part of left foot limited to breakdown of skin: Secondary | ICD-10-CM | POA: Diagnosis not present

## 2020-08-26 DIAGNOSIS — I10 Essential (primary) hypertension: Secondary | ICD-10-CM | POA: Diagnosis not present

## 2020-08-26 DIAGNOSIS — I872 Venous insufficiency (chronic) (peripheral): Secondary | ICD-10-CM | POA: Diagnosis not present

## 2020-08-26 DIAGNOSIS — L97813 Non-pressure chronic ulcer of other part of right lower leg with necrosis of muscle: Secondary | ICD-10-CM | POA: Diagnosis not present

## 2020-08-30 DIAGNOSIS — L97322 Non-pressure chronic ulcer of left ankle with fat layer exposed: Secondary | ICD-10-CM | POA: Diagnosis not present

## 2020-08-30 DIAGNOSIS — I872 Venous insufficiency (chronic) (peripheral): Secondary | ICD-10-CM | POA: Diagnosis not present

## 2020-08-30 DIAGNOSIS — M150 Primary generalized (osteo)arthritis: Secondary | ICD-10-CM | POA: Diagnosis not present

## 2020-08-30 DIAGNOSIS — L97521 Non-pressure chronic ulcer of other part of left foot limited to breakdown of skin: Secondary | ICD-10-CM | POA: Diagnosis not present

## 2020-08-30 DIAGNOSIS — L97813 Non-pressure chronic ulcer of other part of right lower leg with necrosis of muscle: Secondary | ICD-10-CM | POA: Diagnosis not present

## 2020-08-30 DIAGNOSIS — I10 Essential (primary) hypertension: Secondary | ICD-10-CM | POA: Diagnosis not present

## 2020-09-01 DIAGNOSIS — M25371 Other instability, right ankle: Secondary | ICD-10-CM | POA: Diagnosis not present

## 2020-09-01 DIAGNOSIS — M160 Bilateral primary osteoarthritis of hip: Secondary | ICD-10-CM | POA: Diagnosis not present

## 2020-09-01 DIAGNOSIS — M21541 Acquired clubfoot, right foot: Secondary | ICD-10-CM | POA: Diagnosis not present

## 2020-09-01 DIAGNOSIS — R2681 Unsteadiness on feet: Secondary | ICD-10-CM | POA: Diagnosis not present

## 2020-09-06 DIAGNOSIS — I872 Venous insufficiency (chronic) (peripheral): Secondary | ICD-10-CM | POA: Diagnosis not present

## 2020-09-06 DIAGNOSIS — L97521 Non-pressure chronic ulcer of other part of left foot limited to breakdown of skin: Secondary | ICD-10-CM | POA: Diagnosis not present

## 2020-09-06 DIAGNOSIS — L97322 Non-pressure chronic ulcer of left ankle with fat layer exposed: Secondary | ICD-10-CM | POA: Diagnosis not present

## 2020-09-06 DIAGNOSIS — M150 Primary generalized (osteo)arthritis: Secondary | ICD-10-CM | POA: Diagnosis not present

## 2020-09-06 DIAGNOSIS — I10 Essential (primary) hypertension: Secondary | ICD-10-CM | POA: Diagnosis not present

## 2020-09-06 DIAGNOSIS — L97813 Non-pressure chronic ulcer of other part of right lower leg with necrosis of muscle: Secondary | ICD-10-CM | POA: Diagnosis not present

## 2020-09-07 DIAGNOSIS — Z79891 Long term (current) use of opiate analgesic: Secondary | ICD-10-CM | POA: Diagnosis not present

## 2020-09-07 DIAGNOSIS — M1710 Unilateral primary osteoarthritis, unspecified knee: Secondary | ICD-10-CM | POA: Diagnosis not present

## 2020-09-07 DIAGNOSIS — M7918 Myalgia, other site: Secondary | ICD-10-CM | POA: Diagnosis not present

## 2020-09-09 DIAGNOSIS — I872 Venous insufficiency (chronic) (peripheral): Secondary | ICD-10-CM | POA: Diagnosis not present

## 2020-09-09 DIAGNOSIS — L97521 Non-pressure chronic ulcer of other part of left foot limited to breakdown of skin: Secondary | ICD-10-CM | POA: Diagnosis not present

## 2020-09-09 DIAGNOSIS — I10 Essential (primary) hypertension: Secondary | ICD-10-CM | POA: Diagnosis not present

## 2020-09-09 DIAGNOSIS — L97813 Non-pressure chronic ulcer of other part of right lower leg with necrosis of muscle: Secondary | ICD-10-CM | POA: Diagnosis not present

## 2020-09-09 DIAGNOSIS — M150 Primary generalized (osteo)arthritis: Secondary | ICD-10-CM | POA: Diagnosis not present

## 2020-09-09 DIAGNOSIS — L97322 Non-pressure chronic ulcer of left ankle with fat layer exposed: Secondary | ICD-10-CM | POA: Diagnosis not present

## 2020-09-13 DIAGNOSIS — M150 Primary generalized (osteo)arthritis: Secondary | ICD-10-CM | POA: Diagnosis not present

## 2020-09-13 DIAGNOSIS — L97521 Non-pressure chronic ulcer of other part of left foot limited to breakdown of skin: Secondary | ICD-10-CM | POA: Diagnosis not present

## 2020-09-13 DIAGNOSIS — L97322 Non-pressure chronic ulcer of left ankle with fat layer exposed: Secondary | ICD-10-CM | POA: Diagnosis not present

## 2020-09-13 DIAGNOSIS — L97813 Non-pressure chronic ulcer of other part of right lower leg with necrosis of muscle: Secondary | ICD-10-CM | POA: Diagnosis not present

## 2020-09-13 DIAGNOSIS — I10 Essential (primary) hypertension: Secondary | ICD-10-CM | POA: Diagnosis not present

## 2020-09-13 DIAGNOSIS — I872 Venous insufficiency (chronic) (peripheral): Secondary | ICD-10-CM | POA: Diagnosis not present

## 2020-09-14 DIAGNOSIS — Z961 Presence of intraocular lens: Secondary | ICD-10-CM | POA: Diagnosis not present

## 2020-09-14 DIAGNOSIS — H43813 Vitreous degeneration, bilateral: Secondary | ICD-10-CM | POA: Diagnosis not present

## 2020-09-14 DIAGNOSIS — H26493 Other secondary cataract, bilateral: Secondary | ICD-10-CM | POA: Diagnosis not present

## 2020-09-16 DIAGNOSIS — L97322 Non-pressure chronic ulcer of left ankle with fat layer exposed: Secondary | ICD-10-CM | POA: Diagnosis not present

## 2020-09-16 DIAGNOSIS — L97813 Non-pressure chronic ulcer of other part of right lower leg with necrosis of muscle: Secondary | ICD-10-CM | POA: Diagnosis not present

## 2020-09-16 DIAGNOSIS — I872 Venous insufficiency (chronic) (peripheral): Secondary | ICD-10-CM | POA: Diagnosis not present

## 2020-09-16 DIAGNOSIS — I10 Essential (primary) hypertension: Secondary | ICD-10-CM | POA: Diagnosis not present

## 2020-09-16 DIAGNOSIS — M150 Primary generalized (osteo)arthritis: Secondary | ICD-10-CM | POA: Diagnosis not present

## 2020-09-16 DIAGNOSIS — L97521 Non-pressure chronic ulcer of other part of left foot limited to breakdown of skin: Secondary | ICD-10-CM | POA: Diagnosis not present

## 2020-09-20 DIAGNOSIS — M150 Primary generalized (osteo)arthritis: Secondary | ICD-10-CM | POA: Diagnosis not present

## 2020-09-20 DIAGNOSIS — I872 Venous insufficiency (chronic) (peripheral): Secondary | ICD-10-CM | POA: Diagnosis not present

## 2020-09-20 DIAGNOSIS — L97322 Non-pressure chronic ulcer of left ankle with fat layer exposed: Secondary | ICD-10-CM | POA: Diagnosis not present

## 2020-09-20 DIAGNOSIS — L97521 Non-pressure chronic ulcer of other part of left foot limited to breakdown of skin: Secondary | ICD-10-CM | POA: Diagnosis not present

## 2020-09-20 DIAGNOSIS — L97813 Non-pressure chronic ulcer of other part of right lower leg with necrosis of muscle: Secondary | ICD-10-CM | POA: Diagnosis not present

## 2020-09-20 DIAGNOSIS — I10 Essential (primary) hypertension: Secondary | ICD-10-CM | POA: Diagnosis not present

## 2020-09-21 DIAGNOSIS — L97521 Non-pressure chronic ulcer of other part of left foot limited to breakdown of skin: Secondary | ICD-10-CM | POA: Diagnosis not present

## 2020-09-21 DIAGNOSIS — L97311 Non-pressure chronic ulcer of right ankle limited to breakdown of skin: Secondary | ICD-10-CM | POA: Diagnosis not present

## 2020-09-21 DIAGNOSIS — I890 Lymphedema, not elsewhere classified: Secondary | ICD-10-CM | POA: Diagnosis not present

## 2020-09-21 DIAGNOSIS — A498 Other bacterial infections of unspecified site: Secondary | ICD-10-CM | POA: Diagnosis not present

## 2020-09-22 ENCOUNTER — Encounter (HOSPITAL_COMMUNITY): Payer: Self-pay | Admitting: Emergency Medicine

## 2020-09-22 ENCOUNTER — Emergency Department (HOSPITAL_COMMUNITY): Payer: Medicare Other

## 2020-09-22 ENCOUNTER — Other Ambulatory Visit: Payer: Self-pay

## 2020-09-22 ENCOUNTER — Observation Stay (HOSPITAL_COMMUNITY)
Admission: EM | Admit: 2020-09-22 | Discharge: 2020-09-24 | Disposition: A | Discharge status: Home / Self Care | Payer: Medicare Other | Attending: Family Medicine | Admitting: Family Medicine

## 2020-09-22 DIAGNOSIS — L97929 Non-pressure chronic ulcer of unspecified part of left lower leg with unspecified severity: Secondary | ICD-10-CM | POA: Diagnosis not present

## 2020-09-22 DIAGNOSIS — L97919 Non-pressure chronic ulcer of unspecified part of right lower leg with unspecified severity: Secondary | ICD-10-CM

## 2020-09-22 DIAGNOSIS — G8929 Other chronic pain: Secondary | ICD-10-CM | POA: Diagnosis not present

## 2020-09-22 DIAGNOSIS — Z87891 Personal history of nicotine dependence: Secondary | ICD-10-CM | POA: Insufficient documentation

## 2020-09-22 DIAGNOSIS — I83019 Varicose veins of right lower extremity with ulcer of unspecified site: Secondary | ICD-10-CM

## 2020-09-22 DIAGNOSIS — L97522 Non-pressure chronic ulcer of other part of left foot with fat layer exposed: Secondary | ICD-10-CM | POA: Diagnosis not present

## 2020-09-22 DIAGNOSIS — M170 Bilateral primary osteoarthritis of knee: Secondary | ICD-10-CM | POA: Diagnosis not present

## 2020-09-22 DIAGNOSIS — I83029 Varicose veins of left lower extremity with ulcer of unspecified site: Secondary | ICD-10-CM | POA: Diagnosis not present

## 2020-09-22 DIAGNOSIS — Z0000 Encounter for general adult medical examination without abnormal findings: Secondary | ICD-10-CM | POA: Diagnosis not present

## 2020-09-22 DIAGNOSIS — Z96643 Presence of artificial hip joint, bilateral: Secondary | ICD-10-CM | POA: Diagnosis not present

## 2020-09-22 DIAGNOSIS — I10 Essential (primary) hypertension: Secondary | ICD-10-CM | POA: Insufficient documentation

## 2020-09-22 DIAGNOSIS — Z20822 Contact with and (suspected) exposure to covid-19: Secondary | ICD-10-CM | POA: Insufficient documentation

## 2020-09-22 DIAGNOSIS — Z1331 Encounter for screening for depression: Secondary | ICD-10-CM | POA: Diagnosis not present

## 2020-09-22 DIAGNOSIS — L089 Local infection of the skin and subcutaneous tissue, unspecified: Secondary | ICD-10-CM | POA: Diagnosis not present

## 2020-09-22 DIAGNOSIS — Z79899 Other long term (current) drug therapy: Secondary | ICD-10-CM | POA: Diagnosis not present

## 2020-09-22 DIAGNOSIS — M19072 Primary osteoarthritis, left ankle and foot: Secondary | ICD-10-CM | POA: Diagnosis not present

## 2020-09-22 DIAGNOSIS — M19071 Primary osteoarthritis, right ankle and foot: Secondary | ICD-10-CM | POA: Diagnosis not present

## 2020-09-22 DIAGNOSIS — M2011 Hallux valgus (acquired), right foot: Secondary | ICD-10-CM | POA: Diagnosis not present

## 2020-09-22 DIAGNOSIS — L97409 Non-pressure chronic ulcer of unspecified heel and midfoot with unspecified severity: Secondary | ICD-10-CM | POA: Diagnosis not present

## 2020-09-22 HISTORY — DX: Osteomyelitis, unspecified: M86.9

## 2020-09-22 HISTORY — DX: Essential (primary) hypertension: I10

## 2020-09-22 HISTORY — DX: Osteopetrosis: Q78.2

## 2020-09-22 LAB — CBC WITH DIFFERENTIAL/PLATELET
Abs Immature Granulocytes: 0 10*3/uL (ref 0.00–0.07)
Basophils Absolute: 0.1 10*3/uL (ref 0.0–0.1)
Basophils Relative: 1 %
Eosinophils Absolute: 0.1 10*3/uL (ref 0.0–0.5)
Eosinophils Relative: 3 %
HCT: 36.1 % (ref 36.0–46.0)
Hemoglobin: 11.4 g/dL — ABNORMAL LOW (ref 12.0–15.0)
Immature Granulocytes: 0 %
Lymphocytes Relative: 33 %
Lymphs Abs: 1.2 10*3/uL (ref 0.7–4.0)
MCH: 32.7 pg (ref 26.0–34.0)
MCHC: 31.6 g/dL (ref 30.0–36.0)
MCV: 103.4 fL — ABNORMAL HIGH (ref 80.0–100.0)
Monocytes Absolute: 0.4 10*3/uL (ref 0.1–1.0)
Monocytes Relative: 12 %
Neutro Abs: 1.8 10*3/uL (ref 1.7–7.7)
Neutrophils Relative %: 51 %
Platelets: 340 10*3/uL (ref 150–400)
RBC: 3.49 MIL/uL — ABNORMAL LOW (ref 3.87–5.11)
RDW: 13.8 % (ref 11.5–15.5)
WBC: 3.7 10*3/uL — ABNORMAL LOW (ref 4.0–10.5)
nRBC: 0 % (ref 0.0–0.2)

## 2020-09-22 LAB — COMPREHENSIVE METABOLIC PANEL
ALT: 10 U/L (ref 0–44)
AST: 16 U/L (ref 15–41)
Albumin: 3.7 g/dL (ref 3.5–5.0)
Alkaline Phosphatase: 64 U/L (ref 38–126)
Anion gap: 6 (ref 5–15)
BUN: 22 mg/dL (ref 8–23)
CO2: 29 mmol/L (ref 22–32)
Calcium: 9.1 mg/dL (ref 8.9–10.3)
Chloride: 105 mmol/L (ref 98–111)
Creatinine, Ser: 0.65 mg/dL (ref 0.44–1.00)
GFR, Estimated: >60 mL/min (ref >60)
Glucose, Bld: 101 mg/dL — ABNORMAL HIGH (ref 70–99)
Potassium: 3.9 mmol/L (ref 3.5–5.1)
Sodium: 140 mmol/L (ref 135–145)
Total Bilirubin: 0.2 mg/dL — ABNORMAL LOW (ref 0.3–1.2)
Total Protein: 7.3 g/dL (ref 6.5–8.1)

## 2020-09-22 LAB — RESP PANEL BY RT-PCR (FLU A&B, COVID) ARPGX2
Influenza A by PCR: NEGATIVE
Influenza B by PCR: NEGATIVE
SARS Coronavirus 2 by RT PCR: NEGATIVE

## 2020-09-22 LAB — SEDIMENTATION RATE: Sed Rate: 47 mm/hr — ABNORMAL HIGH (ref 0–22)

## 2020-09-22 LAB — C-REACTIVE PROTEIN: CRP: 2 mg/dL — ABNORMAL HIGH (ref <1.0)

## 2020-09-22 LAB — LACTIC ACID, PLASMA
Lactic Acid, Venous: 1 mmol/L (ref 0.5–1.9)
Lactic Acid, Venous: 1.3 mmol/L (ref 0.5–1.9)

## 2020-09-22 MED ORDER — ACETAMINOPHEN 325 MG PO TABS
650.0000 mg | ORAL_TABLET | Freq: Four times a day (QID) | ORAL | Status: DC | PRN
  Administered 2020-09-23: 650 mg via ORAL
  Filled 2020-09-22: qty 2

## 2020-09-22 MED ORDER — AMLODIPINE BESYLATE 5 MG PO TABS
5.0000 mg | ORAL_TABLET | Freq: Every day | ORAL | Status: DC
  Administered 2020-09-23 – 2020-09-24 (×2): 5 mg via ORAL
  Filled 2020-09-22 (×2): qty 1

## 2020-09-22 MED ORDER — DICLOFENAC SODIUM 75 MG PO TBEC
75.0000 mg | DELAYED_RELEASE_TABLET | Freq: Two times a day (BID) | ORAL | Status: DC | PRN
  Filled 2020-09-22: qty 1

## 2020-09-22 MED ORDER — ACETAMINOPHEN 650 MG RE SUPP: 650.0000 mg | Freq: Four times a day (QID) | RECTAL | Status: DC | PRN

## 2020-09-22 MED ORDER — TRAMADOL HCL 50 MG PO TABS
50.0000 mg | ORAL_TABLET | Freq: Four times a day (QID) | ORAL | Status: DC | PRN
  Administered 2020-09-24: 50 mg via ORAL
  Filled 2020-09-22: qty 1

## 2020-09-22 MED ORDER — DOCUSATE SODIUM 100 MG PO CAPS
100.0000 mg | ORAL_CAPSULE | Freq: Two times a day (BID) | ORAL | Status: DC
  Administered 2020-09-22: 100 mg via ORAL
  Filled 2020-09-22 (×4): qty 1

## 2020-09-22 MED ORDER — ENOXAPARIN SODIUM 40 MG/0.4ML IJ SOSY
40.0000 mg | PREFILLED_SYRINGE | INTRAMUSCULAR | Status: DC
  Administered 2020-09-22 – 2020-09-23 (×2): 40 mg via SUBCUTANEOUS
  Filled 2020-09-22 (×2): qty 0.4

## 2020-09-22 MED ORDER — AMLODIPINE BESYLATE 5 MG PO TABS: 5.0000 mg | ORAL_TABLET | Freq: Every day | ORAL | Status: DC

## 2020-09-22 MED ORDER — DICLOFENAC SODIUM 75 MG PO TBEC
75.0000 mg | DELAYED_RELEASE_TABLET | Freq: Once | ORAL | Status: AC
  Administered 2020-09-22: 75 mg via ORAL
  Filled 2020-09-22: qty 1
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
## 2020-09-22 NOTE — ED Notes (Signed)
MD notified of pt's request for pain med, asked if pt needed an iv, per md pt does not need an iv at this point.

## 2020-09-22 NOTE — Assessment & Plan Note (Signed)
Stable

## 2020-09-22 NOTE — Subjective & Objective (Signed)
CC: left foot ulerations HPI: 75 year old African-American female with a history of hypertension, osteoarthritis, chronic venous ulcerations of both feet who presents to the ER today from her PCP office.  Patient lives in [place].  She had been going to the [facility] wound care clinic previously.  Patient has a greater than 10-year history of chronic nonhealing wounds of her feet.  Patient states that she was referred to the [facility] wound care center due to failure to improve while she was at the [facility] wound care clinic.  She has been followed there at the [facility] wound care clinic since April 2021.  Patient has undergone multiple rounds of antibiotics since then.  Her wound cultures have grown Pseudomonas.  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  Over the last several weeks, the patient has had worsening wound of her left leg.  She has not had any fever or chills.  She has not developed any white count.  Apparently the patient went to the wound care clinic yesterday and was told that she should be admitted to the hospital for IV antibiotics.  Not sure why the patient was not admitted to [facility].  Then the patient went to her PCP today which is in [facility] next to [facility].  Instead of being admitted to the [facility] system for IV antibiotics, family drove down here to [facility] for evaluation here in the ER.  Patient has not had any fever or chills.  She has had some drainage of her feet.  This has been green in color but similar to her other episodes in the past.  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  Evaluation in the ER showed patient was afebrile.  Temp 99.1.  Laboratory evaluation White count 3.7.  Hemoglobin 0.4.  Serum glucose of 101.  CRP was slightly elevated 2.0.  Sed rate was slightly elevated at 47.  Lactic acid was normal at 1.0.  Patient's COVID-negative.  X-rays of her left and  right feet were negative for osteomyelitis.  Patient or family states that she had a vascular examination which may have included an angiogram performed at [facility] in [place].  There are no records available in care everywhere.  There are also no other vascular studies in the system at all.  Review of the wound care notes from April 2021 from [facility] described that they had reviewed the outside records I am assuming from [place] that showed no evidence of arterial or venous insufficiency from arterial and venous Dopplers.  There is no mention of the patient receiving an angiogram.  After long and detailed discussion with the patient, her niece Daryl Eastern and the patient's caregiver Marcelino Duster, it was decided that the patient would likely need a repeat evaluation performed by the wound care team, possibly infectious disease, possibly vascular surgery, possibly plastic surgery, possibly orthopedics to determine whether or not the patient has potential for healing these chronic wounds.  The services are not available here at [facility] and the patient will be need to be transferred to [facility] for further evaluation.  I did offer to the patient the choice of place the patient in the hospital for IV antibiotics and then convert this to home IV antibiotics so that the wound care center at [facility] could manage this.  Patient has no signs of systemic illness and I think that the risk of being in the hospital with multidrug-resistant organisms  outweighs the benefit of IV antibiotics on the inpatient side.  The patient and family want to have this wound reevaluated to see if there is any healing potential and have chosen to go to [facility] for further evaluation.  They understand that there is a possibility that it may be recommended the patient receive an amputation due to the inability for her wounds to heal.

## 2020-09-22 NOTE — Assessment & Plan Note (Addendum)
Transfer to [facility] for wound care evaluation along with vascular evaluation. May need angiogram and toe pressure to evaluate for potential for wound healing. Family and patient aware that this may take some time to get all evaluation needed.  Pt's wound are not draining pus, have active inflammation. 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 Pt has been on multiple rounds of quinolones therapy in the last 6 months for pseudomonas colonization. I doubt that IV abx are going to clear up her ulcerations.  Pt and family aware that amputation is also a possibility if pt's wound healing potential is felt to be poor/limited. Pt and family agree to transfer to [facility] for evaluation.

## 2020-09-22 NOTE — Assessment & Plan Note (Signed)
Continue ultram prn.

## 2020-09-22 NOTE — ED Triage Notes (Signed)
Pt brought in from home by caregiver with c/o wounds to bilateral feet. Pt was recently treated with PO Levaquin, but treatment failed to improve her condition. Pt had follow up with her doctor today and told to come to the ED due to increased drainage, redness, swelling and need for IV antibiotics.

## 2020-09-22 NOTE — Assessment & Plan Note (Signed)
Chronic. 
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
## 2020-09-22 NOTE — ED Notes (Signed)
Dressings placed on bilateral lower extremities per md instructions, xeroform gauze with wet to dry, and kerlex.  Pt tolerated well and approved of the dressings placed, pt has small quarter sized wound on R ankle, and on L foot has wounds to top of foot back to her heel.

## 2020-09-22 NOTE — ED Notes (Signed)
Pt in bed, pt appears calm and relaxed, pt reports 10/10 knee pain, family at bedside,

## 2020-09-22 NOTE — ED Provider Notes (Signed)
[facility] EMERGENCY DEPARTMENT Provider Note   CSN: [id number] Arrival date & time: 09/22/20  1212     History Chief Complaint  Patient presents with   Foot Wounds    Kayla Reynolds is a 75 y.o. female.  HPI Patient presents with infections of her feet.  Sent in by wound care and provider for admission.  Has been on Levaquin for pseudomonal infection of the feet.  Has increased redness and drainage.  Has chronic wounds here.  Has been on Levaquin on and off since January.  Patient feels okay otherwise.  No fevers or chills.  Has been managed through [facility] wound care.  Notes are available through care everywhere    Past Medical History:  Diagnosis Date   Arthritis    Hypertension    Osteomyelitis (HCC)    Osteopetrosis    Peripheral vascular disease (HCC)    Ulcer    BLE ulcerations    Patient Active Problem List   Diagnosis Date Noted   Atherosclerosis of native arteries of the extremities with ulceration(440.23) 09/26/2011    Past Surgical History:  Procedure Laterality Date   EYE SURGERY     Bilateral cataract surgery   JOINT REPLACEMENT     Bilateral Hip replacements   SKIN GRAFT     Graft from Thigh --- to left foot   TOE AMPUTATION     Left great toe amputation     OB History   No obstetric history on file.     Family History  Problem Relation Age of Onset   Cancer Other    Hypertension Other    Stroke Other    Diabetes Other     Social History   Tobacco Use   Smoking status: Former    Types: Cigarettes    Quit date: 01/24/2000    Years since quitting: 20.6   Smokeless tobacco: Never  Substance Use Topics   Alcohol use: No   Drug use: No    Home Medications Prior to Admission medications   Medication Sig Start Date End Date Taking? Authorizing Provider  acetaminophen (TYLENOL) 500 MG tablet Take 500 mg by mouth every 6 (six) hours as needed.   Yes [provider]  amLODipine (NORVASC) 5 MG tablet Take 5 mg by mouth daily.  07/12/20  Yes [provider]  Cholecalciferol 25 MCG (1000 UT) capsule Take 5,000 Units by mouth daily. 12/07/15  Yes [provider]  diclofenac (VOLTAREN) 75 MG EC tablet Take 75 mg by mouth 2 (two) times daily.   Yes [provider]  Emollient (RESTA) CREA Apply 1 application topically daily.   Yes [provider]  furosemide (LASIX) 20 MG tablet as needed.   Yes [provider]  ibandronate (BONIVA) 150 MG tablet Take 150 mg by mouth every 30 (thirty) days. 09/22/20  Yes [provider]  triamcinolone cream (KENALOG) 0.1 % Apply 1 application topically daily as needed (itching).   Yes [provider]  benazepril (LOTENSIN) 20 MG tablet Take 20 mg by mouth daily. Patient not taking: No sig reported    [provider]  benazepril-hydrochlorthiazide (LOTENSIN HCT) 10-12.5 MG tablet Take by mouth. Patient not taking: No sig reported 03/04/19   [provider]  bupivacaine (MARCAINE) 0.5 % injection  08/18/20   [provider]  ciprofloxacin (CIPRO) 500 MG tablet Take 500 mg by mouth 2 (two) times daily. Patient not taking: No sig reported    [provider]  doxycycline (VIBRAMYCIN)  100 MG capsule Take 1 capsule (100 mg total) by mouth 2 (two) times daily. Patient not taking: No sig reported 07/29/14   Kirichenko, Lemont Fillers, PA-C  fluorometholone (FML) 0.1 % ophthalmic suspension Apply 1 drop to eye every 4 (four) hours. Patient not taking: No sig reported    [provider]  gabapentin (NEURONTIN) 600 MG tablet Take 600 mg by mouth 3 (three) times daily. Patient not taking: No sig reported    [provider]  Pincus Sanes 1 G injection  09/15/11   [provider]  meclizine (ANTIVERT) 12.5 MG tablet Take 12.5 mg by mouth 3 (three) times daily as needed. Patient not taking: No sig reported    [provider]  traMADol (ULTRAM) 50 MG tablet Take by mouth. Patient not taking:  No sig reported 09/07/20 10/07/20  [provider]  triamcinolone acetonide (TRIESENCE) 40 MG/ML SUSP Inject into the articular space. Patient not taking: Reported on 09/22/2020 08/18/20   [provider]    Allergies    Asa [aspirin], Penicillins, and Sulfa antibiotics  Review of Systems   Review of Systems  Constitutional:  Negative for appetite change and fatigue.  HENT:  Negative for congestion.   Respiratory:  Negative for shortness of breath.   Cardiovascular:  Negative for chest pain.  Gastrointestinal:  Negative for abdominal pain.  Genitourinary:  Negative for flank pain.  Skin:  Positive for wound.  Neurological:  Negative for weakness.  Psychiatric/Behavioral:  Negative for confusion.    Physical Exam Updated Vital Signs BP 104/60 (BP Location: Right Arm)   Pulse 80   Temp 97.8 F (36.6 C) (Oral)   Resp 17   Ht 5\' 3"  (1.6 m)   Wt 77.1 kg   SpO2 100%   BMI 30.11 kg/m   Physical Exam Vitals and nursing note reviewed.  Constitutional:      Appearance: Normal appearance.  HENT:     Head: Atraumatic.  Cardiovascular:     Rate and Rhythm: Regular rhythm.  Abdominal:     Tenderness: There is no abdominal tenderness.  Musculoskeletal:     Cervical back: Neck supple.     Comments: Patient presents with pressure dressings on bilateral lower legs.  Visualized pictures of wounds today.  Dorsal lateral left foot  Skin:    General: Skin is warm.  Neurological:     Mental Status: She is alert. Mental status is at baseline.         ED Results / Procedures / Treatments   Labs (all labs ordered are listed, but only abnormal results are displayed) Labs Reviewed  COMPREHENSIVE METABOLIC PANEL - Abnormal; Notable for the following components:      Result Value   Glucose, Bld 101 (*)    Total Bilirubin 0.2 (*)    All other components within normal limits  CBC WITH DIFFERENTIAL/PLATELET - Abnormal; Notable for the following components:   WBC 3.7  (*)    RBC 3.49 (*)    Hemoglobin 11.4 (*)    MCV 103.4 (*)    All other components within normal limits  RESP PANEL BY RT-PCR (FLU A&B, COVID) [id number]  LACTIC ACID, PLASMA  LACTIC ACID, PLASMA  SEDIMENTATION RATE  C-REACTIVE PROTEIN    EKG None  Radiology DG Foot Complete Left  Result Date: 09/22/2020 CLINICAL DATA:  Source/infection on feet that have been treated with antibiotics, getting worse EXAM: LEFT FOOT - COMPLETE 3+ VIEW; RIGHT FOOT COMPLETE - 3+ VIEW COMPARISON:  Bilateral foot radiographs  07/21/2020 FINDINGS: Right: There is no evidence of osseous destruction. There is no acute fracture. 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 Alignment is otherwise stable, with unchanged hammertoe deformity and varus angulation of the midfoot relative to the hindfoot. There is moderate degenerative change about the midfoot, unchanged. The soft tissues are unremarkable.  There is no soft tissue gas. Left: The patient is status post amputation of the great toe phalanges. There is no evidence of osseous destruction. There is no acute fracture. Alignment is stable, with unchanged hammertoe deformity. There is mild degenerative change about the midfoot. The soft tissues are unremarkable. There is no soft tissue gas. IMPRESSION: No radiographic evidence of osteomyelitis in the feet. If there is persistent clinical concern, MRI may be obtained. Electronically Signed   By: Lesia Hausen M.D.   On: 09/22/2020 16:08   DG Foot Complete Right  Result Date: 09/22/2020 CLINICAL DATA:  Source/infection on feet that have been treated with antibiotics, getting worse EXAM: LEFT FOOT - COMPLETE 3+ VIEW; RIGHT FOOT COMPLETE - 3+ VIEW COMPARISON:  Bilateral foot radiographs 07/21/2020 FINDINGS: Right: There is no evidence of osseous destruction. There is no acute fracture. 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 Alignment is otherwise stable, with unchanged hammertoe deformity and varus angulation of the  midfoot relative to the hindfoot. There is moderate degenerative change about the midfoot, unchanged. The soft tissues are unremarkable.  There is no soft tissue gas. Left: The patient is status post amputation of the great toe phalanges. There is no evidence of osseous destruction. There is no acute fracture. Alignment is stable, with unchanged hammertoe deformity. There is mild degenerative change about the midfoot. The soft tissues are unremarkable. There is no soft tissue gas. IMPRESSION: No radiographic evidence of osteomyelitis in the feet. If there is persistent clinical concern, MRI may be obtained. Electronically Signed   By: Lesia Hausen M.D.   On: 09/22/2020 16:08    Procedures Procedures   Medications Ordered in ED Medications - No data to display  ED Course  I have reviewed the triage vital signs and the nursing notes.  Pertinent labs & imaging results that were available during my care of the patient were reviewed by me and considered in my medical decision making (see chart for details).    MDM Rules/Calculators/A&P                           Patient with chronic wounds of bilateral feet.  Seen at wound care.  Has been on Levaquin.  Worsening infections.  Sent in for IV antibiotics.  X-rays reassuring did not show osteomyelitis will likely need MRI.  Does not appear septic at this time.  Will discuss with hospitalist Final Clinical Impression(s) / ED Diagnoses Final diagnoses:  Foot infection    Rx / DC Orders ED Discharge Orders     None        Benjiman Core, MD 09/22/20 [phone]

## 2020-09-23 ENCOUNTER — Observation Stay (HOSPITAL_COMMUNITY): Payer: Medicare Other

## 2020-09-23 DIAGNOSIS — I83019 Varicose veins of right lower extremity with ulcer of unspecified site: Secondary | ICD-10-CM | POA: Diagnosis not present

## 2020-09-23 DIAGNOSIS — G8929 Other chronic pain: Secondary | ICD-10-CM | POA: Diagnosis not present

## 2020-09-23 DIAGNOSIS — L97519 Non-pressure chronic ulcer of other part of right foot with unspecified severity: Secondary | ICD-10-CM | POA: Diagnosis not present

## 2020-09-23 DIAGNOSIS — L97929 Non-pressure chronic ulcer of unspecified part of left lower leg with unspecified severity: Secondary | ICD-10-CM | POA: Diagnosis not present

## 2020-09-23 DIAGNOSIS — I872 Venous insufficiency (chronic) (peripheral): Secondary | ICD-10-CM | POA: Diagnosis not present

## 2020-09-23 DIAGNOSIS — L97919 Non-pressure chronic ulcer of unspecified part of right lower leg with unspecified severity: Secondary | ICD-10-CM | POA: Diagnosis not present

## 2020-09-23 DIAGNOSIS — M170 Bilateral primary osteoarthritis of knee: Secondary | ICD-10-CM | POA: Diagnosis not present

## 2020-09-23 DIAGNOSIS — R600 Localized edema: Secondary | ICD-10-CM | POA: Diagnosis not present

## 2020-09-23 DIAGNOSIS — I10 Essential (primary) hypertension: Secondary | ICD-10-CM | POA: Diagnosis not present

## 2020-09-23 DIAGNOSIS — Z87891 Personal history of nicotine dependence: Secondary | ICD-10-CM | POA: Diagnosis not present

## 2020-09-23 DIAGNOSIS — I83029 Varicose veins of left lower extremity with ulcer of unspecified site: Secondary | ICD-10-CM | POA: Diagnosis not present

## 2020-09-23 DIAGNOSIS — Z20822 Contact with and (suspected) exposure to covid-19: Secondary | ICD-10-CM | POA: Diagnosis not present

## 2020-09-23 DIAGNOSIS — Z96643 Presence of artificial hip joint, bilateral: Secondary | ICD-10-CM | POA: Diagnosis not present

## 2020-09-23 DIAGNOSIS — L97529 Non-pressure chronic ulcer of other part of left foot with unspecified severity: Secondary | ICD-10-CM | POA: Diagnosis not present

## 2020-09-23 DIAGNOSIS — Z79899 Other long term (current) drug therapy: Secondary | ICD-10-CM | POA: Diagnosis not present

## 2020-09-23 DIAGNOSIS — L97522 Non-pressure chronic ulcer of other part of left foot with fat layer exposed: Secondary | ICD-10-CM | POA: Diagnosis not present

## 2020-09-23 DIAGNOSIS — M7989 Other specified soft tissue disorders: Secondary | ICD-10-CM | POA: Diagnosis not present

## 2020-09-23 MED ORDER — GADOBUTROL 1 MMOL/ML IV SOLN
7.0000 mL | Freq: Once | INTRAVENOUS | Status: AC | PRN
  Administered 2020-09-23: 7 mL via INTRAVENOUS

## 2020-09-23 NOTE — ED Notes (Signed)
Attempted to give report x1 at 1235

## 2020-09-23 NOTE — ED Notes (Signed)
Pt remains resting comfortably with no complaints or needs at this time.

## 2020-09-23 NOTE — Progress Notes (Signed)
PROGRESS NOTE   Kayla Reynolds  OZH:[id number] DOB: May 27, 1945 DOA: 09/22/2020 PCP: Toma Deiters, MD   Chief Complaint  Patient presents with   Foot Wounds   Level of care: Med-Surg  Brief Admission History:  75 year old African-American female with a history of hypertension, osteoarthritis, chronic venous ulcerations of both feet who presents to the ER today from her PCP office.  Patient lives in [place].  She had been going to the [facility] wound care clinic previously.  Patient has a greater than 10-year history of chronic nonhealing wounds of her feet.  Patient states that she was referred to the [facility] wound care center due to failure to improve while she was at the [facility] wound care clinic.  She has been followed there at the [facility] wound care clinic since April 2021.  Patient has undergone multiple rounds of antibiotics since then.  Her wound cultures have grown Pseudomonas.  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  Over the last several weeks, the patient has had worsening wound of her left leg.  She has not had any fever or chills.  She has not developed any white count.  Apparently the patient went to the wound care clinic yesterday and was told that she should be admitted to the hospital for IV antibiotics.   Family requested admission to [facility] for secondary opinion regarding management of these chronic wounds.    Assessment & Plan:   Principal Problem:   Foot ulceration, left, with fat layer exposed (HCC) Active Problems:   Essential hypertension   Chronic pain   Primary osteoarthritis of knees, bilateral   Venous ulcers of both lower extremities (HCC)   Foot ulceration, left, with fat layer exposed (HCC) Transfer to [facility] for wound care evaluation along with vascular evaluation. May need angiogram and toe pressure to evaluate for potential for wound healing. Family and patient aware that this may take some time to get all evaluation  needed.  Pt's wound are not draining pus, have active inflammation. 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 Pt has been on multiple rounds of quinolones therapy in the last 6 months for pseudomonas colonization. I doubt that IV abx are going to clear up her ulcerations.  Pt and family aware that amputation is also a possibility if pt's wound healing potential is felt to be poor/limited. Pt and family requesting to transfer to [facility] for evaluation for secondary opinion regarding management of these chronic wounds.            Venous ulcers of both lower extremities (HCC) Chronic. Pt and family state pt has had skin grafts on both feet in the past. Vascular surgery consultation after transfer to [facility] hospital.    Essential hypertension Stable.   Chronic pain Continue ultram prn.   Primary osteoarthritis of knees, bilateral Chronic.    DVT prophylaxis: enoxaparin Code Status: Full  Family Communication: sister updated by telephone 9/1 Disposition: TBD Status is: Observation  The patient remains OBS appropriate and will d/c before 2 midnights.  Dispo: The patient is from: Home              Anticipated d/c is to: Home              Patient currently is not medically stable to d/c.   Difficult to place patient No  Consultants:  Pending transfer to [facility]  Procedures:    Antimicrobials:   Subjective: Pt is without any specific complaints.  Objective: Vitals:   09/23/20 1030 09/23/20 1039 09/23/20 1100 09/23/20 1332  BP: (!) 99/55 (!) 99/55 (!) 97/52 (!) 101/57  Pulse: 71 74 73 78  Resp:  16  18  Temp:    98 F (36.7 C)  TempSrc:    Oral  SpO2: 99% 98% 98% 99%  Weight:      Height:       No intake or output data in the 24 hours ending 09/23/20 1509 Filed Weights   09/22/20 1257  Weight: 77.1 kg    Examination:  General exam: frail, elderly emaciated appearing female, Appears calm and comfortable  Respiratory system: Clear to auscultation.  Respiratory effort normal. Cardiovascular system: normal S1 & S2 heard. No JVD, murmurs, rubs, gallops or clicks. No pedal edema. Gastrointestinal system: Abdomen is nondistended, soft and nontender. No organomegaly or masses felt. Normal bowel sounds heard. Central nervous system: Alert and oriented. No focal neurological deficits. Extremities: severe chronic ulcerations and wounds of both feet and lower extremities. Palpable DP pulses bilateral.  Skin: No rashes, lesions or ulcers Psychiatry: Judgement and insight appear normal. Mood & affect flat.   Data Reviewed: I have personally reviewed following labs and imaging studies  CBC: Recent Labs  Lab 09/22/20 1410  WBC 3.7*  NEUTROABS 1.8  HGB 11.4*  HCT 36.1  MCV 103.4*  PLT 340    Basic Metabolic Panel: Recent Labs  Lab 09/22/20 1410  NA 140  K 3.9  CL 105  CO2 29  GLUCOSE 101*  BUN 22  CREATININE 0.65  CALCIUM 9.1    GFR: Estimated Creatinine Clearance: 59.8 mL/min (by C-G formula based on SCr of 0.65 mg/dL).  Liver Function Tests: Recent Labs  Lab 09/22/20 1410  AST 16  ALT 10  ALKPHOS 64  BILITOT 0.2*  PROT 7.3  ALBUMIN 3.7    CBG: No results for input(s): GLUCAP in the last 168 hours.  Recent Results (from the past 240 hour(s))  Resp Panel by RT-PCR (Flu A&B, Covid) Nasopharyngeal Swab     Status: None   Collection Time: 09/22/20  3:25 PM   Specimen: Nasopharyngeal Swab; Nasopharyngeal(NP) swabs in vial transport medium  Result Value Ref Range Status   SARS Coronavirus 2 by RT PCR NEGATIVE NEGATIVE Final    Comment: (NOTE) SARS-CoV-2 target nucleic acids are NOT DETECTED.  The SARS-CoV-2 RNA is generally detectable in upper respiratory specimens during the acute phase of infection. The lowest concentration of SARS-CoV-2 viral copies this assay can detect is 138 copies/mL. A negative result does not preclude SARS-Cov-2 infection and should not be used as the sole basis for treatment or other  patient management decisions. A negative result may occur with  improper specimen collection/handling, submission of specimen other than nasopharyngeal swab, presence of viral mutation(s) within the areas targeted by this assay, and inadequate number of viral copies(<138 copies/mL). A negative result must be combined with clinical observations, patient history, and epidemiological information. The expected result is Negative.  Fact Sheet for Patients:  BloggerCourse.com  Fact Sheet for Healthcare Providers:  SeriousBroker.it  This test is no t yet approved or cleared by the [place] FDA and  has been authorized for detection and/or diagnosis of SARS-CoV-2 by FDA under an Emergency Use Authorization (EUA). This EUA will remain  in effect (meaning this test can be used) for the duration of the COVID-19 declaration under Section 564(b)(1) of the Act, 21 U.S.C.section 360bbb-3(b)(1), unless the authorization is terminated  or revoked sooner.  Influenza A by PCR NEGATIVE NEGATIVE Final   Influenza B by PCR NEGATIVE NEGATIVE Final    Comment: (NOTE) The Xpert Xpress SARS-CoV-2/FLU/RSV plus assay is intended as an aid in the diagnosis of influenza from Nasopharyngeal swab specimens and should not be used as a sole basis for treatment. Nasal washings and aspirates are unacceptable for Xpert Xpress SARS-CoV-2/FLU/RSV testing.  Fact Sheet for Patients: BloggerCourse.com  Fact Sheet for Healthcare Providers: SeriousBroker.it  This test is not yet approved or cleared by the [place] FDA and has been authorized for detection and/or diagnosis of SARS-CoV-2 by FDA under an Emergency Use Authorization (EUA). This EUA will remain in effect (meaning this test can be used) for the duration of the COVID-19 declaration under Section 564(b)(1) of the Act, 21 U.S.C. section  360bbb-3(b)(1), unless the authorization is terminated or revoked.  Performed at [facility], [address], [place], [place] [address]      Radiology Studies: MR FOOT LEFT W WO CONTRAST  Result Date: 09/23/2020 CLINICAL DATA:  Foot swelling, nondiabetic, osteomyelitis suspected EXAM: MRI OF THE LEFT FOREFOOT WITHOUT AND WITH CONTRAST TECHNIQUE: Multiplanar, multisequence MR imaging of the left foot was performed both before and after administration of intravenous contrast. CONTRAST:  52mL GADAVIST GADOBUTROL 1 MMOL/ML IV SOLN COMPARISON:  Left foot radiograph 09/22/2020 FINDINGS: Bones/Joint/Cartilage The cortex is intact. There is no significant marrow signal alteration. Prior great toe amputation. There is dorsiflexion at the metatarsophalangeal joints of the lesser digits and flexion at the proximal interphalangeal joints. Ligaments The Lisfranc ligament is intact. Muscles and Tendons Mild muscle atrophy in the foot with intramuscular edema as can be seen in diabetics. No acute tendon tear. Soft tissues Diffuse soft tissue swelling. There is soft tissue thickening and ulceration along the dorsal forefoot. IMPRESSION: Soft tissue swelling with ulceration along the dorsal foot. No evidence of osteomyelitis or soft tissue abscess. Electronically Signed   By: Caprice Renshaw M.D.   On: 09/23/2020 08:54   DG Foot Complete Left  Result Date: 09/22/2020 CLINICAL DATA:  Source/infection on feet that have been treated with antibiotics, getting worse EXAM: LEFT FOOT - COMPLETE 3+ VIEW; RIGHT FOOT COMPLETE - 3+ VIEW COMPARISON:  Bilateral foot radiographs 07/21/2020 FINDINGS: Right: There is no evidence of osseous destruction. There is no acute fracture. 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 Alignment is otherwise stable, with unchanged hammertoe deformity and varus angulation of the midfoot relative to the hindfoot. There is moderate degenerative change about the midfoot, unchanged. The soft tissues are  unremarkable.  There is no soft tissue gas. Left: The patient is status post amputation of the great toe phalanges. There is no evidence of osseous destruction. There is no acute fracture. Alignment is stable, with unchanged hammertoe deformity. There is mild degenerative change about the midfoot. The soft tissues are unremarkable. There is no soft tissue gas. IMPRESSION: No radiographic evidence of osteomyelitis in the feet. If there is persistent clinical concern, MRI may be obtained. Electronically Signed   By: Lesia Hausen M.D.   On: 09/22/2020 16:08   DG Foot Complete Right  Result Date: 09/22/2020 CLINICAL DATA:  Source/infection on feet that have been treated with antibiotics, getting worse EXAM: LEFT FOOT - COMPLETE 3+ VIEW; RIGHT FOOT COMPLETE - 3+ VIEW COMPARISON:  Bilateral foot radiographs 07/21/2020 FINDINGS: Right: There is no evidence of osseous destruction. There is no acute fracture. 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 Alignment is otherwise stable, with unchanged hammertoe deformity and varus angulation of the  midfoot relative to the hindfoot. There is moderate degenerative change about the midfoot, unchanged. The soft tissues are unremarkable.  There is no soft tissue gas. Left: The patient is status post amputation of the great toe phalanges. There is no evidence of osseous destruction. There is no acute fracture. Alignment is stable, with unchanged hammertoe deformity. There is mild degenerative change about the midfoot. The soft tissues are unremarkable. There is no soft tissue gas. IMPRESSION: No radiographic evidence of osteomyelitis in the feet. If there is persistent clinical concern, MRI may be obtained. Electronically Signed   By: Lesia Hausen M.D.   On: 09/22/2020 16:08    Scheduled Meds:  amLODipine  5 mg Oral Daily   docusate sodium  100 mg Oral BID   enoxaparin (LOVENOX) injection  40 mg Subcutaneous Q24H   Continuous Infusions:   LOS: 0 days   Time spent: 38  mins   Roben Tatsch Laural Benes, MD How to contact the [facility] Attending or Consulting provider 7A - 7P or covering provider during after hours 7P -7A, for this patient?  Check the care team in [facility] and look for a) attending/consulting [facility] provider listed and b) the [facility] team listed Log into [email or web address] and use [place]'s universal password to access. If you do not have the password, please contact the hospital operator. Locate the [facility] provider you are looking for under [facility] and page to a number that you can be directly reached. If you still have difficulty reaching the provider, please page the [facility] (Director on Call) for the Hospitalists listed on amion for assistance.  09/23/2020, 3:09 PM

## 2020-09-23 NOTE — Consult Note (Addendum)
Hospital Consult    Reason for Consult:  non healing wounds BLE Requesting Physician:  [facility] MRN #:  [id number]  History of Present Illness: This is a 75 y.o. female with past medical history significant for hypertension, osteoporosis, and long history of off-and-on wounds of bilateral lower extremities over a 10-year history.  She has been involved in wound care which includes skin grafting, hyperbaric oxygen, Unna boots, and numerous procedures.  She was seen by her PCP in office today who recommended that she report to the emergency department due to question of infection of left foot wound.  She presented to [facility] however was transferred to [facility] for IV antibiotics.  She states she has had 2 arteriograms to evaluate circulation over the past 2 years when in [place], and most recently in [place].  She states that both arteriograms were negative for hemodynamically significant stenosis.  She also states that the wound center providers mention to her that she has adequate blood flow to her feet.  She denies any rest pain in her feet.  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  She denies tobacco use.  She also denies medical history of diabetes.  Past Medical History:  Diagnosis Date   Arthritis    Hypertension    Osteomyelitis (HCC)    Osteopetrosis    Peripheral vascular disease (HCC)    Ulcer    BLE ulcerations    Past Surgical History:  Procedure Laterality Date   EYE SURGERY     Bilateral cataract surgery   JOINT REPLACEMENT     Bilateral Hip replacements   SKIN GRAFT     Graft from Thigh --- to left foot   TOE AMPUTATION     Left great toe amputation    Allergies  Allergen Reactions   Asa [Aspirin]    Penicillins    Sulfa Antibiotics     Prior to Admission medications   Medication Sig Start Date End Date Taking? Authorizing Provider  acetaminophen (TYLENOL) 500 MG  tablet Take 500 mg by mouth every 6 (six) hours as needed.   Yes [provider]  amLODipine (NORVASC) 5 MG tablet Take 5 mg by mouth daily. 07/12/20  Yes [provider]  Cholecalciferol 25 MCG (1000 UT) capsule Take 5,000 Units by mouth daily. 12/07/15  Yes [provider]  diclofenac (VOLTAREN) 75 MG EC tablet Take 75 mg by mouth 2 (two) times daily.   Yes [provider]  Emollient (RESTA) CREA Apply 1 application topically daily.   Yes [provider]  furosemide (LASIX) 20 MG tablet as needed.   Yes [provider]  ibandronate (BONIVA) 150 MG tablet Take 150 mg by mouth every 30 (thirty) days. 09/22/20  Yes [provider]  triamcinolone cream (KENALOG) 0.1 % Apply 1 application topically daily as needed (itching).   Yes [provider]  benazepril (LOTENSIN) 20 MG tablet Take 20 mg by mouth daily. Patient not taking: No sig reported    [provider]  benazepril-hydrochlorthiazide (LOTENSIN HCT) 10-12.5 MG tablet Take by mouth. Patient not taking: No sig reported 03/04/19   [provider]  bupivacaine (MARCAINE) 0.5 % injection  08/18/20   [provider]  ciprofloxacin (CIPRO) 500 MG tablet Take 500 mg by mouth 2 (two) times daily. Patient not taking: No sig reported    [provider]  doxycycline (VIBRAMYCIN) 100 MG capsule  Take 1 capsule (100 mg total) by mouth 2 (two) times daily. Patient not taking: No sig reported 07/29/14   Kirichenko, Lemont Fillers, PA-C  fluorometholone (FML) 0.1 % ophthalmic suspension Apply 1 drop to eye every 4 (four) hours. Patient not taking: No sig reported    [provider]  gabapentin (NEURONTIN) 600 MG tablet Take 600 mg by mouth 3 (three) times daily. Patient not taking: No sig reported    [provider]  Pincus Sanes 1 G injection  09/15/11   [provider]  meclizine (ANTIVERT) 12.5 MG tablet Take 12.5 mg by mouth 3 (three) times  daily as needed. Patient not taking: No sig reported    [provider]  traMADol (ULTRAM) 50 MG tablet Take by mouth. Patient not taking: No sig reported 09/07/20 10/07/20  [provider]  triamcinolone acetonide (TRIESENCE) 40 MG/ML SUSP Inject into the articular space. Patient not taking: Reported on 09/22/2020 08/18/20   [provider]    Social History   Socioeconomic History   Marital status: Single    Spouse name: Not on file   Number of children: Not on file   Years of education: Not on file   Highest education level: Not on file  Occupational History   Not on file  Tobacco Use   Smoking status: Former    Types: Cigarettes    Quit date: 01/24/2000    Years since quitting: 20.6   Smokeless tobacco: Never  Substance and Sexual Activity   Alcohol use: No   Drug use: No   Sexual activity: Not on file  Other Topics Concern   Not on file  Social History Narrative   Not on file   Social Determinants of Health   Financial Resource Strain: Not on file  Food Insecurity: Not on file  Transportation Needs: Not on file  Physical Activity: Not on file  Stress: Not on file  Social Connections: Not on file  Intimate Partner Violence: Not on file     Family History  Problem Relation Age of Onset   Cancer Other    Hypertension Other    Stroke Other    Diabetes Other     ROS: Otherwise negative unless mentioned in HPI  Physical Examination  Vitals:   09/23/20 1100 09/23/20 1332  BP: (!) 97/52 (!) 101/57  Pulse: 73 78  Resp:  18  Temp:  98 F (36.7 C)  SpO2: 98% 99%   Body mass index is 30.11 kg/m.  General:  WDWN in NAD Gait: Not observed HENT: WNL, normocephalic Pulmonary: normal non-labored breathing, without Rales, rhonchi,  wheezing Cardiac: regular Abdomen:  soft, NT/ND, no masses Skin: without rashes Vascular Exam/Pulses: Easily palpable and symmetrical popliteal pulses Extremities: Pictures noted of extensive left dorsal  foot and lateral foot tissue loss; medial right lower leg skin graft nearly healed; dressings left in place during exam today Musculoskeletal: no muscle wasting or atrophy  Neurologic: A&O X 3;  No focal weakness or paresthesias are detected; speech is fluent/normal Psychiatric:  The pt has Normal affect. Lymph:  Unremarkable  CBC    Component Value Date/Time   WBC 3.7 (L) 09/22/2020 1410   RBC 3.49 (L) 09/22/2020 1410   HGB 11.4 (L) 09/22/2020 1410   HCT 36.1 09/22/2020 1410   PLT 340 09/22/2020 1410   MCV 103.4 (H) 09/22/2020 1410   MCH 32.7 09/22/2020 1410   MCHC 31.6 09/22/2020 1410   RDW 13.8 09/22/2020 1410   LYMPHSABS 1.2 09/22/2020 1410  MONOABS 0.4 09/22/2020 1410   EOSABS 0.1 09/22/2020 1410   BASOSABS 0.1 09/22/2020 1410    BMET    Component Value Date/Time   NA 140 09/22/2020 1410   K 3.9 09/22/2020 1410   CL 105 09/22/2020 1410   CO2 29 09/22/2020 1410   GLUCOSE 101 (H) 09/22/2020 1410   BUN 22 09/22/2020 1410   CREATININE 0.65 09/22/2020 1410   CALCIUM 9.1 09/22/2020 1410   GFRNONAA >60 09/22/2020 1410   GFRAA >60 07/29/2014 1526    COAGS: No results found for: INR, PROTIME   Non-Invasive Vascular Imaging:   ABI pending    ASSESSMENT/PLAN: This is a 75 y.o. female with extensive history of wound care for nonhealing wounds of bilateral lower extremities  -Patient recently had dressing changed of bilateral lower extremities.  Looking at pictures taken earlier today I do not see any obvious purulence or infection.  MRI also negative for osteomyelitis.  Agree with continued dressing changes and IV antibiotics.  She has had 2 negative arteriograms for flow-limiting stenosis in the past 2 years however we will check an ABI to see if readings are compatible with wound healing.  She has easily palpable and symmetrical femoral and popliteal pulses.  On-call vascular surgeon Dr. Lenell Antu will evaluate the patient later today and provide further treatment  plans.   Emilie Rutter PA-C [facility] [phone]  VASCULAR STAFF ADDENDUM: I have independently interviewed and examined the patient. I agree with the above.  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 Needs medicated multilayer compression garments (Unna boots) which has already started.  Recent angiograms without flow limiting stenosis by report. Will check ABI in AM to ensure no evidence of arterial disease.  Rande Brunt. Lenell Antu, MD [facility] Phone Number: [phone] 09/23/2020 5:07 PM

## 2020-09-23 NOTE — Progress Notes (Signed)
Interim progress note.   Patient transferred from [facility] to [facility] for vascular surgery evaluation regarding chronic wound/ulcer involving the left foot that has not improved with multiple rounds of antibiotics targeting a +culture growing pseudomonas previously. There was suspicion that IV antibiotics would be required.  Fortunately the patient appears hemodynamically stable, afebrile, without leukocytosis. MRI of the involved area is not suggestive of osteomyelitis. Notes from previous MD includes palpable DP pulse.    - Due to clinical stability, will not initiate further antibiotics. Were she to become septic, of course would start broad coverage inclusive of pseudomonas. I've consulted ID to assist in decision making for abx going forward.  - Will continue wound dressings per WOC recommendations.  - I've ordered ABIs to confirm adequate arterial flow, though based on reassuring arteriograms in the past and palpable pulses without claudication, suspect venous insufficiency is primary driver of poor wound healing.  - Consulted vascular surgery and appreciate their recommendations.  - Will proceed with A/P otherwise per Dr. Henriette Combs progress note today.   Kayla Junker, MD 09/23/2020 4:55 PM

## 2020-09-23 NOTE — Progress Notes (Signed)
Pt's sister Liliane Bade) cell number [phone]

## 2020-09-23 NOTE — ED Notes (Signed)
ED TO INPATIENT HANDOFF REPORT  ED Nurse Name and Phone #: [phone]  S Name/Age/Gender Kayla Reynolds 75 y.o. female Room/Bed: APAH2/APAH2  Code Status   Code Status: Full Code  Home/SNF/Other Home Patient oriented to: self Is this baseline? Yes   Triage Complete: Triage complete  Chief Complaint Foot ulceration, left, with fat layer exposed [facility]) [L97.522]  Triage Note Pt brought in from home by caregiver with c/o wounds to bilateral feet. Pt was recently treated with PO Levaquin, but treatment failed to improve her condition. Pt had follow up with her doctor today and told to come to the ED due to increased drainage, redness, swelling and need for IV antibiotics.    Allergies Allergies  Allergen Reactions  . Asa [Aspirin]   . Penicillins   . Sulfa Antibiotics     Level of Care/Admitting Diagnosis ED Disposition    ED Disposition  Admit   Condition  --   Comment  Hospital Area: [facility] [[id number]]  Level of Care: Med-Surg [16]  May place patient in observation at [facility] or [facility] if equivalent level of care is available:: Yes  Covid Evaluation: Confirmed COVID Negative  Diagnosis: Foot ulceration, left, with fat layer exposed [facility]) [[id number]]  Admitting Physician: Imogene Burn ERIC [[id number]]  Attending Physician: Imogene Burn, ERIC [[id number]]         B Medical/Surgery History Past Medical History:  Diagnosis Date  . Arthritis   . Hypertension   . Osteomyelitis (HCC)   . Osteopetrosis   . Peripheral vascular disease (HCC)   . Ulcer    BLE ulcerations   Past Surgical History:  Procedure Laterality Date  . EYE SURGERY     Bilateral cataract surgery  . JOINT REPLACEMENT     Bilateral Hip replacements  . SKIN GRAFT     Graft from Thigh --- to left foot  . TOE AMPUTATION     Left great toe amputation     A IV Location/Drains/Wounds Patient Lines/Drains/Airways Status    Active Line/Drains/Airways    Name Placement date Placement time Site  Days   Peripheral IV 09/22/20 20 G Right Forearm 09/22/20  2212  Forearm  1   External Urinary Catheter 09/23/20  0601  --  less than 1   Pressure Ulcer 07/29/14 Stage II -  Partial thickness loss of dermis presenting as a shallow open ulcer with a red, pink wound bed without slough. left lateral semicircular wound with previous skin graft 07/29/14  1441  -- 2248   Wound / Incision (Open or Dehisced) 09/23/20 Non-pressure wound Foot Left;Distal dorsal; distal 09/23/20  0855  Foot  less than 1   Wound / Incision (Open or Dehisced) 09/23/20 Non-pressure wound Foot Left;Proximal dorsal; proximal to other wound 09/23/20  0856  Foot  less than 1   Wound / Incision (Open or Dehisced) 09/23/20 Non-pressure wound Ankle Right;Medial 09/23/20  0856  Ankle  less than 1          Intake/Output Last 24 hours No intake or output data in the 24 hours ending 09/23/20 1154  Labs/Imaging Results for orders placed or performed during the hospital encounter of 09/22/20 (from the past 48 hour(s))  Lactic acid, plasma     Status: None   Collection Time: 09/22/20  2:10 PM  Result Value Ref Range   Lactic Acid, Venous 1.0 0.5 - 1.9 mmol/L    Comment: Performed at [facility], [address], [place], [place] [address]  Comprehensive metabolic panel  Status: Abnormal   Collection Time: 09/22/20  2:10 PM  Result Value Ref Range   Sodium 140 135 - 145 mmol/L   Potassium 3.9 3.5 - 5.1 mmol/L   Chloride 105 98 - 111 mmol/L   CO2 29 22 - 32 mmol/L   Glucose, Bld 101 (H) 70 - 99 mg/dL    Comment: Glucose reference range applies only to samples taken after fasting for at least 8 hours.   BUN 22 8 - 23 mg/dL   Creatinine, Ser 1.61 0.44 - 1.00 mg/dL   Calcium 9.1 8.9 - 09.6 mg/dL   Total Protein 7.3 6.5 - 8.1 g/dL   Albumin 3.7 3.5 - 5.0 g/dL   AST 16 15 - 41 U/L   ALT 10 0 - 44 U/L   Alkaline Phosphatase 64 38 - 126 U/L   Total Bilirubin 0.2 (L) 0.3 - 1.2 mg/dL   GFR, Estimated >04 >54 mL/min    Comment:  (NOTE) Calculated using the CKD-EPI Creatinine Equation (2021)    Anion gap 6 5 - 15    Comment: Performed at [facility], [address], [place], [place] [address]  CBC with Differential     Status: Abnormal   Collection Time: 09/22/20  2:10 PM  Result Value Ref Range   WBC 3.7 (L) 4.0 - 10.5 K/uL   RBC 3.49 (L) 3.87 - 5.11 MIL/uL   Hemoglobin 11.4 (L) 12.0 - 15.0 g/dL   HCT 91.4 78.2 - 95.6 %   MCV 103.4 (H) 80.0 - 100.0 fL   MCH 32.7 26.0 - 34.0 pg   MCHC 31.6 30.0 - 36.0 g/dL   RDW 21.3 08.6 - 57.8 %   Platelets 340 150 - 400 K/uL   nRBC 0.0 0.0 - 0.2 %   Neutrophils Relative % 51 %   Neutro Abs 1.8 1.7 - 7.7 K/uL   Lymphocytes Relative 33 %   Lymphs Abs 1.2 0.7 - 4.0 K/uL   Monocytes Relative 12 %   Monocytes Absolute 0.4 0.1 - 1.0 K/uL   Eosinophils Relative 3 %   Eosinophils Absolute 0.1 0.0 - 0.5 K/uL   Basophils Relative 1 %   Basophils Absolute 0.1 0.0 - 0.1 K/uL   Immature Granulocytes 0 %   Abs Immature Granulocytes 0.00 0.00 - 0.07 K/uL    Comment: Performed at [facility], [address], [place], [place] [address]  Resp Panel by RT-PCR (Flu A&B, Covid) Nasopharyngeal Swab     Status: None   Collection Time: 09/22/20  3:25 PM   Specimen: Nasopharyngeal Swab; Nasopharyngeal(NP) swabs in vial transport medium  Result Value Ref Range   SARS Coronavirus 2 by RT PCR NEGATIVE NEGATIVE    Comment: (NOTE) SARS-CoV-2 target nucleic acids are NOT DETECTED.  The SARS-CoV-2 RNA is generally detectable in upper respiratory specimens during the acute phase of infection. The lowest concentration of SARS-CoV-2 viral copies this assay can detect is 138 copies/mL. A negative result does not preclude SARS-Cov-2 infection and should not be used as the sole basis for treatment or other patient management decisions. A negative result may occur with  improper specimen collection/handling, submission of specimen other than nasopharyngeal swab, presence of viral mutation(s) within  the areas targeted by this assay, and inadequate number of viral copies(<138 copies/mL). A negative result must be combined with clinical observations, patient history, and epidemiological information. The expected result is Negative.  Fact Sheet for Patients:  BloggerCourse.com  Fact Sheet for Healthcare Providers:  SeriousBroker.it  This  test is no t yet approved or cleared by the [place] and  has been authorized for detection and/or diagnosis of SARS-CoV-2 by FDA under an Emergency Use Authorization (EUA). This EUA will remain  in effect (meaning this test can be used) for the duration of the COVID-19 declaration under Section 564(b)(1) of the Act, 21 U.S.C.section 360bbb-3(b)(1), unless the authorization is terminated  or revoked sooner.       Influenza A by PCR NEGATIVE NEGATIVE   Influenza B by PCR NEGATIVE NEGATIVE    Comment: (NOTE) The Xpert Xpress SARS-CoV-2/FLU/RSV plus assay is intended as an aid in the diagnosis of influenza from Nasopharyngeal swab specimens and should not be used as a sole basis for treatment. Nasal washings and aspirates are unacceptable for Xpert Xpress SARS-CoV-2/FLU/RSV testing.  Fact Sheet for Patients: BloggerCourse.com  Fact Sheet for Healthcare Providers: SeriousBroker.it  This test is not yet approved or cleared by the [place] FDA and has been authorized for detection and/or diagnosis of SARS-CoV-2 by FDA under an Emergency Use Authorization (EUA). This EUA will remain in effect (meaning this test can be used) for the duration of the COVID-19 declaration under Section 564(b)(1) of the Act, 21 U.S.C. section 360bbb-3(b)(1), unless the authorization is terminated or revoked.  Performed at [facility], [address], [place], [place] [address]   Lactic acid, plasma     Status: None   Collection Time: 09/22/20  3:40 PM   Result Value Ref Range   Lactic Acid, Venous 1.3 0.5 - 1.9 mmol/L    Comment: Performed at [facility], [address], [place], [place] [address]  Sedimentation rate     Status: Abnormal   Collection Time: 09/22/20  3:40 PM  Result Value Ref Range   Sed Rate 47 (H) 0 - 22 mm/hr    Comment: Performed at [facility], [address], [place], [place] [address]  C-reactive protein     Status: Abnormal   Collection Time: 09/22/20  3:40 PM  Result Value Ref Range   CRP 2.0 (H) <1.0 mg/dL    Comment: Performed at [facility], [address] [place], [place] [address]   MR FOOT LEFT W WO CONTRAST  Result Date: 09/23/2020 CLINICAL DATA:  Foot swelling, nondiabetic, osteomyelitis suspected EXAM: MRI OF THE LEFT FOREFOOT WITHOUT AND WITH CONTRAST TECHNIQUE: Multiplanar, multisequence MR imaging of the left foot was performed both before and after administration of intravenous contrast. CONTRAST:  41mL GADAVIST GADOBUTROL 1 MMOL/ML IV SOLN COMPARISON:  Left foot radiograph 09/22/2020 FINDINGS: Bones/Joint/Cartilage The cortex is intact. There is no significant marrow signal alteration. Prior great toe amputation. There is dorsiflexion at the metatarsophalangeal joints of the lesser digits and flexion at the proximal interphalangeal joints. Ligaments The Lisfranc ligament is intact. Muscles and Tendons Mild muscle atrophy in the foot with intramuscular edema as can be seen in diabetics. No acute tendon tear. Soft tissues Diffuse soft tissue swelling. There is soft tissue thickening and ulceration along the dorsal forefoot. IMPRESSION: Soft tissue swelling with ulceration along the dorsal foot. No evidence of osteomyelitis or soft tissue abscess. Electronically Signed   By: Caprice Renshaw M.D.   On: 09/23/2020 08:54   DG Foot Complete Left  Result Date: 09/22/2020 CLINICAL DATA:  Source/infection on feet that have been treated with antibiotics, getting worse EXAM: LEFT FOOT - COMPLETE 3+ VIEW; RIGHT FOOT  COMPLETE - 3+ VIEW COMPARISON:  Bilateral foot radiographs 07/21/2020 FINDINGS: Right: There is no evidence of osseous destruction.  There is no acute fracture. 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 Alignment is otherwise stable, with unchanged hammertoe deformity and varus angulation of the midfoot relative to the hindfoot. There is moderate degenerative change about the midfoot, unchanged. The soft tissues are unremarkable.  There is no soft tissue gas. Left: The patient is status post amputation of the great toe phalanges. There is no evidence of osseous destruction. There is no acute fracture. Alignment is stable, with unchanged hammertoe deformity. There is mild degenerative change about the midfoot. The soft tissues are unremarkable. There is no soft tissue gas. IMPRESSION: No radiographic evidence of osteomyelitis in the feet. If there is persistent clinical concern, MRI may be obtained. Electronically Signed   By: Lesia Hausen M.D.   On: 09/22/2020 16:08   DG Foot Complete Right  Result Date: 09/22/2020 CLINICAL DATA:  Source/infection on feet that have been treated with antibiotics, getting worse EXAM: LEFT FOOT - COMPLETE 3+ VIEW; RIGHT FOOT COMPLETE - 3+ VIEW COMPARISON:  Bilateral foot radiographs 07/21/2020 FINDINGS: Right: There is no evidence of osseous destruction. There is no acute fracture. 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 Alignment is otherwise stable, with unchanged hammertoe deformity and varus angulation of the midfoot relative to the hindfoot. There is moderate degenerative change about the midfoot, unchanged. The soft tissues are unremarkable.  There is no soft tissue gas. Left: The patient is status post amputation of the great toe phalanges. There is no evidence of osseous destruction. There is no acute fracture. Alignment is stable, with unchanged hammertoe deformity. There is mild degenerative change about the midfoot. The soft tissues are unremarkable. There is no  soft tissue gas. IMPRESSION: No radiographic evidence of osteomyelitis in the feet. If there is persistent clinical concern, MRI may be obtained. Electronically Signed   By: Lesia Hausen M.D.   On: 09/22/2020 16:08    Pending Labs Unresulted Labs (From admission, onward)   None      Vitals/Pain Today's Vitals   09/23/20 1015 09/23/20 1030 09/23/20 1039 09/23/20 1100  BP: (!) 104/57 (!) 99/55 (!) 99/55 (!) 97/52  Pulse:  71 74 73  Resp:   16   Temp:      TempSrc:      SpO2:  99% 98% 98%  Weight:      Height:      PainSc:        Isolation Precautions No active isolations  Medications Medications  diclofenac (VOLTAREN) EC tablet 75 mg (has no administration in time range)  traMADol (ULTRAM) tablet 50 mg (has no administration in time range)  enoxaparin (LOVENOX) injection 40 mg (40 mg Subcutaneous Given 09/22/20 2150)  acetaminophen (TYLENOL) tablet 650 mg (has no administration in time range)    Or  acetaminophen (TYLENOL) suppository 650 mg (has no administration in time range)  docusate sodium (COLACE) capsule 100 mg (100 mg Oral Patient Refused/Not Given 09/23/20 1012)  amLODipine (NORVASC) tablet 5 mg (5 mg Oral Given 09/23/20 1012)  diclofenac (VOLTAREN) EC tablet 75 mg (75 mg Oral Given 09/22/20 2149)  gadobutrol (GADAVIST) 1 MMOL/ML injection 7 mL (7 mLs Intravenous Contrast Given 09/23/20 4174)    Mobility walks with device Moderate fall risk   Focused Assessments    R Recommendations: See Admitting Provider Note  Report given to:

## 2020-09-23 NOTE — Consult Note (Signed)
WOC Nurse wound consult note Consultation was completed by review of records, images and assistance from the bedside nurse/clinical staff.   Reason for Consult: foot wounds Patient with 10 year history of extensive bilateral foot wounds; she has been followed by [facility] and [facility] wound care. Was recommended to come in for IV abtx. She has been offered to transfer to [facility] for further work up despite extensive history from outside facilities Wound type: full thickness ulcerations bilateral LEs; most likely venous  Pressure Injury POA: NA Measurement: see nursing FS; however note ED staff have not measured wounds, will need to be performed with 2person RN skin assessment when she is admitted  Wound bed: L  dorsal foot: 100% pale, pink L lateral; just behind malleolus; 100% pale pink R medial malleolus; 100% dry; pale Drainage (amount, consistency, odor) minimal noted in images on current dressings  Periwound: evidence of scarring from other wounds Dressing procedure/placement/frequency:  Single layer of xeroform over all wounds of the feet, cover with dry dressings. Change daily.    Re consult if needed, will not follow at this time. Thanks  Hagan [employer]altz [employer], RN,CWOCN, CNS, CWON-AP [phone])

## 2020-09-24 ENCOUNTER — Observation Stay (HOSPITAL_BASED_OUTPATIENT_CLINIC_OR_DEPARTMENT_OTHER): Payer: Medicare Other

## 2020-09-24 ENCOUNTER — Observation Stay (HOSPITAL_COMMUNITY): Payer: Medicare Other

## 2020-09-24 DIAGNOSIS — L0390 Cellulitis, unspecified: Secondary | ICD-10-CM | POA: Diagnosis not present

## 2020-09-24 DIAGNOSIS — L97929 Non-pressure chronic ulcer of unspecified part of left lower leg with unspecified severity: Secondary | ICD-10-CM | POA: Diagnosis not present

## 2020-09-24 DIAGNOSIS — G8929 Other chronic pain: Secondary | ICD-10-CM | POA: Diagnosis not present

## 2020-09-24 DIAGNOSIS — I83029 Varicose veins of left lower extremity with ulcer of unspecified site: Secondary | ICD-10-CM | POA: Diagnosis not present

## 2020-09-24 DIAGNOSIS — I739 Peripheral vascular disease, unspecified: Secondary | ICD-10-CM

## 2020-09-24 DIAGNOSIS — L97919 Non-pressure chronic ulcer of unspecified part of right lower leg with unspecified severity: Secondary | ICD-10-CM | POA: Diagnosis not present

## 2020-09-24 DIAGNOSIS — I83019 Varicose veins of right lower extremity with ulcer of unspecified site: Secondary | ICD-10-CM | POA: Diagnosis not present

## 2020-09-24 DIAGNOSIS — M170 Bilateral primary osteoarthritis of knee: Secondary | ICD-10-CM | POA: Diagnosis not present

## 2020-09-24 DIAGNOSIS — L97522 Non-pressure chronic ulcer of other part of left foot with fat layer exposed: Secondary | ICD-10-CM | POA: Diagnosis not present

## 2020-09-24 DIAGNOSIS — I10 Essential (primary) hypertension: Secondary | ICD-10-CM | POA: Diagnosis not present

## 2020-09-24 LAB — CBC WITH DIFFERENTIAL/PLATELET
Abs Immature Granulocytes: 0.01 10*3/uL (ref 0.00–0.07)
Basophils Absolute: 0.1 10*3/uL (ref 0.0–0.1)
Basophils Relative: 1 %
Eosinophils Absolute: 0.2 10*3/uL (ref 0.0–0.5)
Eosinophils Relative: 5 %
HCT: 32.1 % — ABNORMAL LOW (ref 36.0–46.0)
Hemoglobin: 10 g/dL — ABNORMAL LOW (ref 12.0–15.0)
Immature Granulocytes: 0 %
Lymphocytes Relative: 36 %
Lymphs Abs: 1.6 10*3/uL (ref 0.7–4.0)
MCH: 31.7 pg (ref 26.0–34.0)
MCHC: 31.2 g/dL (ref 30.0–36.0)
MCV: 101.9 fL — ABNORMAL HIGH (ref 80.0–100.0)
Monocytes Absolute: 0.5 10*3/uL (ref 0.1–1.0)
Monocytes Relative: 12 %
Neutro Abs: 1.9 10*3/uL (ref 1.7–7.7)
Neutrophils Relative %: 46 %
Platelets: 316 10*3/uL (ref 150–400)
RBC: 3.15 MIL/uL — ABNORMAL LOW (ref 3.87–5.11)
RDW: 13.8 % (ref 11.5–15.5)
WBC: 4.3 10*3/uL (ref 4.0–10.5)
nRBC: 0 % (ref 0.0–0.2)

## 2020-09-24 LAB — COMPREHENSIVE METABOLIC PANEL
ALT: 8 U/L (ref 0–44)
AST: 14 U/L — ABNORMAL LOW (ref 15–41)
Albumin: 2.4 g/dL — ABNORMAL LOW (ref 3.5–5.0)
Alkaline Phosphatase: 47 U/L (ref 38–126)
Anion gap: 7 (ref 5–15)
BUN: 20 mg/dL (ref 8–23)
CO2: 27 mmol/L (ref 22–32)
Calcium: 8.3 mg/dL — ABNORMAL LOW (ref 8.9–10.3)
Chloride: 106 mmol/L (ref 98–111)
Creatinine, Ser: 0.64 mg/dL (ref 0.44–1.00)
GFR, Estimated: >60 mL/min (ref >60)
Glucose, Bld: 104 mg/dL — ABNORMAL HIGH (ref 70–99)
Potassium: 3.8 mmol/L (ref 3.5–5.1)
Sodium: 140 mmol/L (ref 135–145)
Total Bilirubin: 0.4 mg/dL (ref 0.3–1.2)
Total Protein: 5.2 g/dL — ABNORMAL LOW (ref 6.5–8.1)

## 2020-09-24 LAB — MAGNESIUM: Magnesium: 1.7 mg/dL (ref 1.7–2.4)

## 2020-09-24 NOTE — Progress Notes (Signed)
ABI has been completed.   Preliminary results in CV Proc.   Aundra Millet Virdia Ziesmer 09/24/2020 1:46 PM

## 2020-09-24 NOTE — Progress Notes (Signed)
[place] to be D/C'd  per MD order.  Discussed with the patient and all questions fully answered.  VSS, Skin clean, dry and intact without evidence of skin break down, no evidence of skin tears noted.  IV catheter discontinued intact. Site without signs and symptoms of complications. Dressing and pressure applied.  An After Visit Summary was printed and given to the patient. Dress bilateral lower extremities with Xeroform, kelex, and coband.   Patient instructed to return to ED, call 911, or call MD for any changes in condition.   Patient to be escorted via WC, and D/C home via private auto.

## 2020-09-24 NOTE — Progress Notes (Signed)
  Brief note  Pt seen and examined this morning. Smiling, appears much better than previous.  Pt with long-standing venous insufficiency.  Unaqboots in place - to be changed today per nursing and pt.   Awaiting ABI  - hopefull to be completed between unaboot changes to assess arterial flow.   Plan - Continue current therapy. Appreciate ID recommendations and primary teams care.  Will follow up ABI.   Victorino Sparrow MD [facility] [phone] 09/24/2020  10:09 AM

## 2020-09-24 NOTE — Discharge Summary (Signed)
Physician Discharge Summary  Alejandrina Killins ZOX:[id number] DOB: [birth year] DOA: 09/22/2020  PCP: Toma Deiters, MD  Admit date: 09/22/2020 Discharge date: 09/24/2020  Admitted From: Home by way of [facility] Disposition: Home   Recommendations for Outpatient Follow-up:  Follow up with wound care center, consider unna boots in addition to continued routine care. Recommend against superficial wound cultures.  Home Health: None new Equipment/Devices: None new Discharge Condition: Stable CODE STATUS: Full Diet recommendation: Heart healthy  Brief/Interim Summary: Heavenli Kear is a 75 y.o. female with a history of poorly healing leg ulcers felt to be complicated by venous insufficiency. She had been referred to the hospital by her wound care center for consideration of IV antibiotics after not seeing improvement with multiple rounds of oral fluoroquinolones due to wound swab revealing pseudomonas. Though she usually receives care in the [facility] system, a different opinion was sought by the patient opting to present to [facility] on 8/31 where she was found to be afebrile, hemodynamically stable without leukocytosis. The wounds involving the left foot appeared to have no exudate or significant surrounding erythema. DP pulses were palpable. MRI of the left foot was reassuring against deep infection. She was transferred to [facility] to have vascular surgery evaluation. After normal ABI, confirming reports of normal arteriograms in the past, vascular surgery has recommended unna boots/compressive therapy and continued local wound care. Infectious disease specialist was consulted and concurred with medical team's plan to not start additional antibiotics at this time. The findings of no arterial insufficiency or infection complicating wound healing were shared with the patient and her family. Having maximized benefit of hospitalization, she is discharged in stable condition with plans to continue  wound care as routine.   Discharge Diagnoses:  Principal Problem:   Foot ulceration, left, with fat layer exposed (HCC) Active Problems:   Essential hypertension   Chronic pain   Primary osteoarthritis of knees, bilateral   Venous ulcers of both lower extremities (HCC)  Obesity: Estimated body mass index is 30.11 kg/m as calculated from the following:   Height as of this encounter: 5\' 3"  (1.6 m).   Weight as of this encounter: 77.1 kg.  Discharge Instructions Discharge Instructions     Diet - low sodium heart healthy   Complete by: As directed    Discharge instructions   Complete by: As directed    You were evaluated by vascular surgery and infectious disease specialists. Fortunately, there is no evidence of blockage of arterial blood flow on testing today. Vascular surgery has recommended that you continue elevating legs, unna boots, and regular wound center follow up. ID recommends against any antibiotics at this time. Please follow up with wound care center and seek medical attention sooner if the wounds show evidence of worsening redness or pus production or if you develop fever.  No medication changes are recommended other than to not continue any antibiotics at this time.   No wound care   Complete by: As directed       Allergies as of 09/24/2020       Reactions   Asa [aspirin]    Penicillins    Sulfa Antibiotics         Medication List     STOP taking these medications    benazepril 20 MG tablet Commonly known as: LOTENSIN   benazepril-hydrochlorthiazide 10-12.5 MG tablet Commonly known as: LOTENSIN HCT   bupivacaine 0.5 % injection Commonly known as: MARCAINE   ciprofloxacin 500 MG tablet Commonly known  as: CIPRO   doxycycline 100 MG capsule Commonly known as: VIBRAMYCIN   fluorometholone 0.1 % ophthalmic suspension Commonly known as: FML   gabapentin 600 MG tablet Commonly known as: NEURONTIN   INVanz 1 g injection Generic drug: ertapenem    meclizine 12.5 MG tablet Commonly known as: ANTIVERT   triamcinolone acetonide 40 MG/ML Susp Commonly known as: TRIESENCE       TAKE these medications    acetaminophen 500 MG tablet Commonly known as: TYLENOL Take 500 mg by mouth every 6 (six) hours as needed.   amLODipine 5 MG tablet Commonly known as: NORVASC Take 5 mg by mouth daily.   Cholecalciferol 25 MCG (1000 UT) capsule Take 5,000 Units by mouth daily.   diclofenac 75 MG EC tablet Commonly known as: VOLTAREN Take 75 mg by mouth 2 (two) times daily.   furosemide 20 MG tablet Commonly known as: LASIX as needed.   ibandronate 150 MG tablet Commonly known as: BONIVA Take 150 mg by mouth every 30 (thirty) days.   Resta Crea Apply 1 application topically daily.   traMADol 50 MG tablet Commonly known as: ULTRAM Take by mouth.   triamcinolone cream 0.1 % Commonly known as: KENALOG Apply 1 application topically daily as needed (itching).        Follow-up Information     Toma Deiters, MD Follow up.   Specialty: Internal Medicine Contact information: [address] [place] [address] [phone]         Sluss, Enid Baas, [place] Follow up.   Specialty: Neurology Contact information: [address] [place] [address] [phone]                Allergies  Allergen Reactions   Asa [Aspirin]    Penicillins    Sulfa Antibiotics     Consultations: Vascular surgery, Dr. Lenell Antu  Procedures/Studies: MR FOOT LEFT W WO CONTRAST  Result Date: 09/23/2020 CLINICAL DATA:  Foot swelling, nondiabetic, osteomyelitis suspected EXAM: MRI OF THE LEFT FOREFOOT WITHOUT AND WITH CONTRAST TECHNIQUE: Multiplanar, multisequence MR imaging of the left foot was performed both before and after administration of intravenous contrast. CONTRAST:  7mL GADAVIST GADOBUTROL 1 MMOL/ML IV SOLN COMPARISON:  Left foot radiograph 09/22/2020 FINDINGS: Bones/Joint/Cartilage The cortex is  intact. There is no significant marrow signal alteration. Prior great toe amputation. There is dorsiflexion at the metatarsophalangeal joints of the lesser digits and flexion at the proximal interphalangeal joints. Ligaments The Lisfranc ligament is intact. Muscles and Tendons Mild muscle atrophy in the foot with intramuscular edema as can be seen in diabetics. No acute tendon tear. Soft tissues Diffuse soft tissue swelling. There is soft tissue thickening and ulceration along the dorsal forefoot. IMPRESSION: Soft tissue swelling with ulceration along the dorsal foot. No evidence of osteomyelitis or soft tissue abscess. Electronically Signed   By: Caprice Renshaw M.D.   On: 09/23/2020 08:54   DG Foot Complete Left  Result Date: 09/22/2020 CLINICAL DATA:  Source/infection on feet that have been treated with antibiotics, getting worse EXAM: LEFT FOOT - COMPLETE 3+ VIEW; RIGHT FOOT COMPLETE - 3+ VIEW COMPARISON:  Bilateral foot radiographs 07/21/2020 FINDINGS: Right: There is no evidence of osseous destruction. There is no acute fracture. 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 Alignment is otherwise stable, with unchanged hammertoe deformity and varus angulation of the midfoot relative to the hindfoot. There is moderate degenerative change about the midfoot, unchanged. The soft tissues are unremarkable.  There is no soft  tissue gas. Left: The patient is status post amputation of the great toe phalanges. There is no evidence of osseous destruction. There is no acute fracture. Alignment is stable, with unchanged hammertoe deformity. There is mild degenerative change about the midfoot. The soft tissues are unremarkable. There is no soft tissue gas. IMPRESSION: No radiographic evidence of osteomyelitis in the feet. If there is persistent clinical concern, MRI may be obtained. Electronically Signed   By: Lesia Hausen M.D.   On: 09/22/2020 16:08   DG Foot Complete Right  Result Date: 09/22/2020 CLINICAL DATA:   Source/infection on feet that have been treated with antibiotics, getting worse EXAM: LEFT FOOT - COMPLETE 3+ VIEW; RIGHT FOOT COMPLETE - 3+ VIEW COMPARISON:  Bilateral foot radiographs 07/21/2020 FINDINGS: Right: There is no evidence of osseous destruction. There is no acute fracture. 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 Alignment is otherwise stable, with unchanged hammertoe deformity and varus angulation of the midfoot relative to the hindfoot. There is moderate degenerative change about the midfoot, unchanged. The soft tissues are unremarkable.  There is no soft tissue gas. Left: The patient is status post amputation of the great toe phalanges. There is no evidence of osseous destruction. There is no acute fracture. Alignment is stable, with unchanged hammertoe deformity. There is mild degenerative change about the midfoot. The soft tissues are unremarkable. There is no soft tissue gas. IMPRESSION: No radiographic evidence of osteomyelitis in the feet. If there is persistent clinical concern, MRI may be obtained. Electronically Signed   By: Lesia Hausen M.D.   On: 09/22/2020 16:08   VAS [place] ABI WITH/WO TBI  Result Date: 09/24/2020  LOWER EXTREMITY DOPPLER STUDY Patient Name:  Elie Kant  Date of Exam:   09/24/2020 Medical Rec #: [id number]      Accession #:    [id number] Date of Birth: 1945/02/02      Patient Gender: F Patient Age:   75 years Exam Location:  [facility] Procedure:      VAS [place] ABI WITH/WO TBI Referring Phys: Hazeline Junker --------------------------------------------------------------------------------  Indications: Ulceration, and peripheral artery disease. High Risk Factors: Hypertension.  Limitations: Today's exam was limited due to an open wound and patient              intolerant to cuff pressure. Comparison Study: no prior Performing Technologist: [place] Ponder RVS  Examination Guidelines: A complete evaluation includes at minimum, Doppler waveform signals and systolic blood  pressure reading at the level of bilateral brachial, anterior tibial, and posterior tibial arteries, when vessel segments are accessible. Bilateral testing is considered an integral part of a complete examination. Photoelectric Plethysmograph (PPG) waveforms and toe systolic pressure readings are included as required and additional duplex testing as needed. Limited examinations for reoccurring indications may be performed as noted.  ABI Findings: +--------+------------------+-----+---------+--------+ Right   Rt Pressure (mmHg)IndexWaveform Comment  +--------+------------------+-----+---------+--------+ [id number]                    triphasic         +--------+------------------+-----+---------+--------+ PTA     103               0.88 biphasic          +--------+------------------+-----+---------+--------+ DP      128               1.09 triphasic         +--------+------------------+-----+---------+--------+ +--------+------------------+-----+---------+-------+ Left    Lt Pressure (mmHg)IndexWaveform Comment +--------+------------------+-----+---------+-------+ [id number]  triphasic        +--------+------------------+-----+---------+-------+ PTA                            triphasic        +--------+------------------+-----+---------+-------+ DP                             triphasic        +--------+------------------+-----+---------+-------+ +-------+-----------+-----------+------------+------------+ ABI/TBIToday's ABIToday's TBIPrevious ABIPrevious TBI +-------+-----------+-----------+------------+------------+ Right  1.09                                           +-------+-----------+-----------+------------+------------+  Summary: Right: Resting right ankle-brachial index is within normal range. No evidence of significant right lower extremity arterial disease. Left: Unable to obtain pressures due to wound location on ankle.  *See  table(s) above for measurements and observations.     Preliminary     Subjective: No new complaints. No fever.   Discharge Exam: Vitals:   09/24/20 0811 09/24/20 1132  BP: 103/61 119/62  Pulse: 71 87  Resp: 18 18  Temp: 98.3 F (36.8 C) 98.6 F (37 C)  SpO2: 100% 100%   General: Pt is alert, awake, not in acute distress Cardiovascular: RRR, S1/S2 +, no rubs, no gallops Respiratory: CTA bilaterally, no wheezing, no rhonchi Abdominal: Soft, NT, ND, bowel sounds + Extremities:        Labs: BNP (last 3 results) No results for input(s): BNP in the last 8760 hours. Basic Metabolic Panel: Recent Labs  Lab 09/22/20 1410 09/24/20 0105  NA 140 140  K 3.9 3.8  CL 105 106  CO2 29 27  GLUCOSE 101* 104*  BUN 22 20  CREATININE 0.65 0.64  CALCIUM 9.1 8.3*  MG  --  1.7   Liver Function Tests: Recent Labs  Lab 09/22/20 1410 09/24/20 0105  AST 16 14*  ALT 10 8  ALKPHOS 64 47  BILITOT 0.2* 0.4  PROT 7.3 5.2*  ALBUMIN 3.7 2.4*   No results for input(s): LIPASE, AMYLASE in the last 168 hours. No results for input(s): AMMONIA in the last 168 hours. CBC: Recent Labs  Lab 09/22/20 1410 09/24/20 0105  WBC 3.7* 4.3  NEUTROABS 1.8 1.9  HGB 11.4* 10.0*  HCT 36.1 32.1*  MCV 103.4* 101.9*  PLT 340 316   Cardiac Enzymes: No results for input(s): CKTOTAL, CKMB, CKMBINDEX, TROPONINI in the last 168 hours. BNP: Invalid input(s): POCBNP CBG: No results for input(s): GLUCAP in the last 168 hours. D-Dimer No results for input(s): DDIMER in the last 72 hours. Hgb A1c No results for input(s): HGBA1C in the last 72 hours. Lipid Profile No results for input(s): CHOL, HDL, LDLCALC, TRIG, CHOLHDL, LDLDIRECT in the last 72 hours. Thyroid function studies No results for input(s): TSH, T4TOTAL, T3FREE, THYROIDAB in the last 72 hours.  Invalid input(s): FREET3 Anemia work up No results for input(s): VITAMINB12, FOLATE, FERRITIN, TIBC, IRON, RETICCTPCT in the last 72  hours. Urinalysis No results found for: COLORURINE, APPEARANCEUR, LABSPEC, PHURINE, GLUCOSEU, HGBUR, BILIRUBINUR, KETONESUR, PROTEINUR, UROBILINOGEN, NITRITE, LEUKOCYTESUR  Microbiology Recent Results (from the past 240 hour(s))  Resp Panel by RT-PCR (Flu A&B, Covid) Nasopharyngeal Swab     Status: None   Collection Time: 09/22/20  3:25 PM   Specimen: Nasopharyngeal Swab; Nasopharyngeal(NP) swabs in vial transport medium  Result Value Ref Range Status   SARS Coronavirus 2 by RT PCR NEGATIVE NEGATIVE Final    Comment: (NOTE) SARS-CoV-2 target nucleic acids are NOT DETECTED.  The SARS-CoV-2 RNA is generally detectable in upper respiratory specimens during the acute phase of infection. The lowest concentration of SARS-CoV-2 viral copies this assay can detect is 138 copies/mL. A negative result does not preclude SARS-Cov-2 infection and should not be used as the sole basis for treatment or other patient management decisions. A negative result may occur with  improper specimen collection/handling, submission of specimen other than nasopharyngeal swab, presence of viral mutation(s) within the areas targeted by this assay, and inadequate number of viral copies(<138 copies/mL). A negative result must be combined with clinical observations, patient history, and epidemiological information. The expected result is Negative.  Fact Sheet for Patients:  BloggerCourse.com  Fact Sheet for Healthcare Providers:  SeriousBroker.it  This test is no t yet approved or cleared by the [place] FDA and  has been authorized for detection and/or diagnosis of SARS-CoV-2 by FDA under an Emergency Use Authorization (EUA). This EUA will remain  in effect (meaning this test can be used) for the duration of the COVID-19 declaration under Section 564(b)(1) of the Act, 21 U.S.C.section 360bbb-3(b)(1), unless the authorization is terminated  or revoked sooner.        Influenza A by PCR NEGATIVE NEGATIVE Final   Influenza B by PCR NEGATIVE NEGATIVE Final    Comment: (NOTE) The Xpert Xpress SARS-CoV-2/FLU/RSV plus assay is intended as an aid in the diagnosis of influenza from Nasopharyngeal swab specimens and should not be used as a sole basis for treatment. Nasal washings and aspirates are unacceptable for Xpert Xpress SARS-CoV-2/FLU/RSV testing.  Fact Sheet for Patients: BloggerCourse.com  Fact Sheet for Healthcare Providers: SeriousBroker.it  This test is not yet approved or cleared by the [place] FDA and has been authorized for detection and/or diagnosis of SARS-CoV-2 by FDA under an Emergency Use Authorization (EUA). This EUA will remain in effect (meaning this test can be used) for the duration of the COVID-19 declaration under Section 564(b)(1) of the Act, 21 U.S.C. section 360bbb-3(b)(1), unless the authorization is terminated or revoked.  Performed at [facility], [address] [place], [place] [address]     Time coordinating discharge: Approximately 40 minutes  Tyrone Nine, MD  [facility] 09/24/2020, 2:20 PM

## 2020-09-24 NOTE — Consult Note (Signed)
[facility]    Date of Admission:  09/22/2020   Total days of antibiotics 0               Reason for Consult: Chronic nonhealing wound of left LE    Referring Provider: Dr. Jarvis Newcomer Primary Care Provider: Dr. Olena Leatherwood  Assessment: Patient with chronic venous insufficiencies and chronic nonhealing wound of bilateral feet for 10 years who is here for possible IV antibiotic need.  She has been following wound care at [facility].  Her wound swab culture grew Pseudomonas since January 2022.  She was on multiple rounds of fluoroquinolone for the last few months.  The wound appears clean and noninfected on exam. MRI was negative for osteomyelitis or abscess.  Patient is hemodynamically stable without any fever, tachycardia or leukocytosis.  I do not believe that the wound is actively infected.  She could be colonized for Pseudomonas that does not need treatment.  Advised patient to continue compression with good wound care.  Vascular surgery was also consulted and waiting for ABI.  Plan: No antibiotic indicated Continue compression with Unna boots Follow-up with wound care  Principal Problem:   Foot ulceration, left, with fat layer exposed (HCC) Active Problems:   Essential hypertension   Chronic pain   Primary osteoarthritis of knees, bilateral   Venous ulcers of both lower extremities (HCC)   Scheduled Meds:  amLODipine  5 mg Oral Daily   docusate sodium  100 mg Oral BID   enoxaparin (LOVENOX) injection  40 mg Subcutaneous Q24H   Continuous Infusions: PRN Meds:.acetaminophen **OR** acetaminophen, diclofenac, traMADol  HPI: Kayla Reynolds is a 75 y.o. female with past medical history of hypertension, chronic insufficiencies and chronic nonhealing wounds of bilateral feet for 10 years.  She is following by [facility] wound care.  Wound culture grew Pseudomonas since January 2022.  She was on multiple rounds of for quinolones for the last few months.  She has seen by  wound care recently and was advised to visit the ED for IV antibiotics.  On admission, the wound appears clean and noninfected.  MRI at was negative for osteomyelitis or abscess.  Patient was hemodynamically stable.  IV antibiotic was not started.  Patient is seen at bedside today.  She appears comfortable in no acute distress.  She denies fever or chills or pain of her foot.  Review of Systems: Per HPI  Past Medical History:  Diagnosis Date   Arthritis    Hypertension    Osteomyelitis (HCC)    Osteopetrosis    Peripheral vascular disease (HCC)    Ulcer    BLE ulcerations    Social History   Tobacco Use   Smoking status: Former    Types: Cigarettes    Quit date: 01/24/2000    Years since quitting: 20.6   Smokeless tobacco: Never  Substance Use Topics   Alcohol use: No   Drug use: No    Family History  Problem Relation Age of Onset   Cancer Other    Hypertension Other    Stroke Other    Diabetes Other    Allergies  Allergen Reactions   Asa [Aspirin]    Penicillins    Sulfa Antibiotics     OBJECTIVE: Blood pressure 103/61, pulse 71, temperature 98.3 F (36.8 C), temperature source Oral, resp. rate 18, height 5\' 3"  (1.6 m), weight 77.1 kg, SpO2 100 %.  Physical Exam Constitutional:  General: She is not in acute distress.    Appearance: She is not ill-appearing.  HENT:     Head: Normocephalic.  Pulmonary:     Effort: Pulmonary effort is normal.  Musculoskeletal:     Comments:  A large open wound extend from dorsal surface to heel seen on left foot. The surrounding skin is nonerythematous.  No signs of active infection.  There is a small wound seen on the medial side right foot, also appears clean and noninfected.  Skin:    General: Skin is warm.     Coloration: Skin is not jaundiced.  Neurological:     Mental Status: She is alert and oriented to person, place, and time.  Psychiatric:        Mood and Affect: Mood normal.        Behavior: Behavior  normal.    Lab Results Lab Results  Component Value Date   WBC 4.3 09/24/2020   HGB 10.0 (L) 09/24/2020   HCT 32.1 (L) 09/24/2020   MCV 101.9 (H) 09/24/2020   PLT 316 09/24/2020    Lab Results  Component Value Date   CREATININE 0.64 09/24/2020   BUN 20 09/24/2020   NA 140 09/24/2020   K 3.8 09/24/2020   CL 106 09/24/2020   CO2 27 09/24/2020    Lab Results  Component Value Date   ALT 8 09/24/2020   AST 14 (L) 09/24/2020   ALKPHOS 47 09/24/2020   BILITOT 0.4 09/24/2020     Microbiology: Recent Results (from the past 240 hour(s))  Resp Panel by RT-PCR (Flu A&B, Covid) Nasopharyngeal Swab     Status: None   Collection Time: 09/22/20  3:25 PM   Specimen: Nasopharyngeal Swab; Nasopharyngeal(NP) swabs in vial transport medium  Result Value Ref Range Status   SARS Coronavirus 2 by RT PCR NEGATIVE NEGATIVE Final    Comment: (NOTE) SARS-CoV-2 target nucleic acids are NOT DETECTED.  The SARS-CoV-2 RNA is generally detectable in upper respiratory specimens during the acute phase of infection. The lowest concentration of SARS-CoV-2 viral copies this assay can detect is 138 copies/mL. A negative result does not preclude SARS-Cov-2 infection and should not be used as the sole basis for treatment or other patient management decisions. A negative result may occur with  improper specimen collection/handling, submission of specimen other than nasopharyngeal swab, presence of viral mutation(s) within the areas targeted by this assay, and inadequate number of viral copies(<138 copies/mL). A negative result must be combined with clinical observations, patient history, and epidemiological information. The expected result is Negative.  Fact Sheet for Patients:  BloggerCourse.com  Fact Sheet for Healthcare Providers:  SeriousBroker.it  This test is no t yet approved or cleared by the [place] FDA and  has been authorized for  detection and/or diagnosis of SARS-CoV-2 by FDA under an Emergency Use Authorization (EUA). This EUA will remain  in effect (meaning this test can be used) for the duration of the COVID-19 declaration under Section 564(b)(1) of the Act, 21 U.S.C.section 360bbb-3(b)(1), unless the authorization is terminated  or revoked sooner.       Influenza A by PCR NEGATIVE NEGATIVE Final   Influenza B by PCR NEGATIVE NEGATIVE Final    Comment: (NOTE) The Xpert Xpress SARS-CoV-2/FLU/RSV plus assay is intended as an aid in the diagnosis of influenza from Nasopharyngeal swab specimens and should not be used as a sole basis for treatment. Nasal washings and aspirates are unacceptable for Xpert Xpress SARS-CoV-2/FLU/RSV testing.  Fact Sheet for  Patients: BloggerCourse.com  Fact Sheet for Healthcare Providers: SeriousBroker.it  This test is not yet approved or cleared by the [place] FDA and has been authorized for detection and/or diagnosis of SARS-CoV-2 by FDA under an Emergency Use Authorization (EUA). This EUA will remain in effect (meaning this test can be used) for the duration of the COVID-19 declaration under Section 564(b)(1) of the Act, 21 U.S.C. section 360bbb-3(b)(1), unless the authorization is terminated or revoked.  Performed at [facility], [address] [place], [place] [address]     Doran Stabler, DO [facility] [phone] pager    09/24/2020, 10:34 AM

## 2020-09-30 DIAGNOSIS — I10 Essential (primary) hypertension: Secondary | ICD-10-CM | POA: Diagnosis not present

## 2020-09-30 DIAGNOSIS — L97813 Non-pressure chronic ulcer of other part of right lower leg with necrosis of muscle: Secondary | ICD-10-CM | POA: Diagnosis not present

## 2020-09-30 DIAGNOSIS — L97322 Non-pressure chronic ulcer of left ankle with fat layer exposed: Secondary | ICD-10-CM | POA: Diagnosis not present

## 2020-09-30 DIAGNOSIS — I872 Venous insufficiency (chronic) (peripheral): Secondary | ICD-10-CM | POA: Diagnosis not present

## 2020-09-30 DIAGNOSIS — M150 Primary generalized (osteo)arthritis: Secondary | ICD-10-CM | POA: Diagnosis not present

## 2020-09-30 DIAGNOSIS — L97521 Non-pressure chronic ulcer of other part of left foot limited to breakdown of skin: Secondary | ICD-10-CM | POA: Diagnosis not present

## 2020-10-04 DIAGNOSIS — I872 Venous insufficiency (chronic) (peripheral): Secondary | ICD-10-CM | POA: Diagnosis not present

## 2020-10-04 DIAGNOSIS — L97322 Non-pressure chronic ulcer of left ankle with fat layer exposed: Secondary | ICD-10-CM | POA: Diagnosis not present

## 2020-10-04 DIAGNOSIS — M150 Primary generalized (osteo)arthritis: Secondary | ICD-10-CM | POA: Diagnosis not present

## 2020-10-04 DIAGNOSIS — I10 Essential (primary) hypertension: Secondary | ICD-10-CM | POA: Diagnosis not present

## 2020-10-04 DIAGNOSIS — L97521 Non-pressure chronic ulcer of other part of left foot limited to breakdown of skin: Secondary | ICD-10-CM | POA: Diagnosis not present

## 2020-10-04 DIAGNOSIS — L97813 Non-pressure chronic ulcer of other part of right lower leg with necrosis of muscle: Secondary | ICD-10-CM | POA: Diagnosis not present

## 2020-10-05 DIAGNOSIS — B351 Tinea unguium: Secondary | ICD-10-CM | POA: Diagnosis not present

## 2020-10-05 DIAGNOSIS — M79671 Pain in right foot: Secondary | ICD-10-CM | POA: Diagnosis not present

## 2020-10-05 DIAGNOSIS — M79675 Pain in left toe(s): Secondary | ICD-10-CM | POA: Diagnosis not present

## 2020-10-05 DIAGNOSIS — M79672 Pain in left foot: Secondary | ICD-10-CM | POA: Diagnosis not present

## 2020-10-05 DIAGNOSIS — M79674 Pain in right toe(s): Secondary | ICD-10-CM | POA: Diagnosis not present

## 2020-10-05 DIAGNOSIS — I739 Peripheral vascular disease, unspecified: Secondary | ICD-10-CM | POA: Diagnosis not present

## 2020-10-05 DIAGNOSIS — L84 Corns and callosities: Secondary | ICD-10-CM | POA: Diagnosis not present

## 2020-10-05 DIAGNOSIS — L600 Ingrowing nail: Secondary | ICD-10-CM | POA: Diagnosis not present

## 2020-10-07 DIAGNOSIS — I10 Essential (primary) hypertension: Secondary | ICD-10-CM | POA: Diagnosis not present

## 2020-10-07 DIAGNOSIS — L97322 Non-pressure chronic ulcer of left ankle with fat layer exposed: Secondary | ICD-10-CM | POA: Diagnosis not present

## 2020-10-07 DIAGNOSIS — M150 Primary generalized (osteo)arthritis: Secondary | ICD-10-CM | POA: Diagnosis not present

## 2020-10-07 DIAGNOSIS — L97521 Non-pressure chronic ulcer of other part of left foot limited to breakdown of skin: Secondary | ICD-10-CM | POA: Diagnosis not present

## 2020-10-07 DIAGNOSIS — L97813 Non-pressure chronic ulcer of other part of right lower leg with necrosis of muscle: Secondary | ICD-10-CM | POA: Diagnosis not present

## 2020-10-07 DIAGNOSIS — I872 Venous insufficiency (chronic) (peripheral): Secondary | ICD-10-CM | POA: Diagnosis not present

## 2020-10-12 DIAGNOSIS — I10 Essential (primary) hypertension: Secondary | ICD-10-CM | POA: Diagnosis not present

## 2020-10-12 DIAGNOSIS — L97521 Non-pressure chronic ulcer of other part of left foot limited to breakdown of skin: Secondary | ICD-10-CM | POA: Diagnosis not present

## 2020-10-12 DIAGNOSIS — M150 Primary generalized (osteo)arthritis: Secondary | ICD-10-CM | POA: Diagnosis not present

## 2020-10-12 DIAGNOSIS — L97322 Non-pressure chronic ulcer of left ankle with fat layer exposed: Secondary | ICD-10-CM | POA: Diagnosis not present

## 2020-10-12 DIAGNOSIS — I872 Venous insufficiency (chronic) (peripheral): Secondary | ICD-10-CM | POA: Diagnosis not present

## 2020-10-12 DIAGNOSIS — L97813 Non-pressure chronic ulcer of other part of right lower leg with necrosis of muscle: Secondary | ICD-10-CM | POA: Diagnosis not present

## 2020-10-14 DIAGNOSIS — L97521 Non-pressure chronic ulcer of other part of left foot limited to breakdown of skin: Secondary | ICD-10-CM | POA: Diagnosis not present

## 2020-10-14 DIAGNOSIS — I10 Essential (primary) hypertension: Secondary | ICD-10-CM | POA: Diagnosis not present

## 2020-10-14 DIAGNOSIS — A498 Other bacterial infections of unspecified site: Secondary | ICD-10-CM | POA: Diagnosis not present

## 2020-10-14 DIAGNOSIS — A4902 Methicillin resistant Staphylococcus aureus infection, unspecified site: Secondary | ICD-10-CM | POA: Diagnosis not present

## 2020-10-14 DIAGNOSIS — I890 Lymphedema, not elsewhere classified: Secondary | ICD-10-CM | POA: Diagnosis not present

## 2020-10-14 DIAGNOSIS — M810 Age-related osteoporosis without current pathological fracture: Secondary | ICD-10-CM | POA: Diagnosis not present

## 2020-10-14 DIAGNOSIS — L97311 Non-pressure chronic ulcer of right ankle limited to breakdown of skin: Secondary | ICD-10-CM | POA: Diagnosis not present

## 2020-10-16 DIAGNOSIS — I872 Venous insufficiency (chronic) (peripheral): Secondary | ICD-10-CM | POA: Diagnosis not present

## 2020-10-16 DIAGNOSIS — L97521 Non-pressure chronic ulcer of other part of left foot limited to breakdown of skin: Secondary | ICD-10-CM | POA: Diagnosis not present

## 2020-10-16 DIAGNOSIS — L97322 Non-pressure chronic ulcer of left ankle with fat layer exposed: Secondary | ICD-10-CM | POA: Diagnosis not present

## 2020-10-16 DIAGNOSIS — M150 Primary generalized (osteo)arthritis: Secondary | ICD-10-CM | POA: Diagnosis not present

## 2020-10-16 DIAGNOSIS — I10 Essential (primary) hypertension: Secondary | ICD-10-CM | POA: Diagnosis not present

## 2020-10-16 DIAGNOSIS — L97813 Non-pressure chronic ulcer of other part of right lower leg with necrosis of muscle: Secondary | ICD-10-CM | POA: Diagnosis not present

## 2020-10-18 DIAGNOSIS — M150 Primary generalized (osteo)arthritis: Secondary | ICD-10-CM | POA: Diagnosis not present

## 2020-10-18 DIAGNOSIS — L97521 Non-pressure chronic ulcer of other part of left foot limited to breakdown of skin: Secondary | ICD-10-CM | POA: Diagnosis not present

## 2020-10-18 DIAGNOSIS — L97813 Non-pressure chronic ulcer of other part of right lower leg with necrosis of muscle: Secondary | ICD-10-CM | POA: Diagnosis not present

## 2020-10-18 DIAGNOSIS — I872 Venous insufficiency (chronic) (peripheral): Secondary | ICD-10-CM | POA: Diagnosis not present

## 2020-10-18 DIAGNOSIS — I10 Essential (primary) hypertension: Secondary | ICD-10-CM | POA: Diagnosis not present

## 2020-10-18 DIAGNOSIS — L97322 Non-pressure chronic ulcer of left ankle with fat layer exposed: Secondary | ICD-10-CM | POA: Diagnosis not present

## 2020-10-19 DIAGNOSIS — Z23 Encounter for immunization: Secondary | ICD-10-CM | POA: Diagnosis not present

## 2020-10-21 DIAGNOSIS — L97521 Non-pressure chronic ulcer of other part of left foot limited to breakdown of skin: Secondary | ICD-10-CM | POA: Diagnosis not present

## 2020-10-21 DIAGNOSIS — I10 Essential (primary) hypertension: Secondary | ICD-10-CM | POA: Diagnosis not present

## 2020-10-21 DIAGNOSIS — L97322 Non-pressure chronic ulcer of left ankle with fat layer exposed: Secondary | ICD-10-CM | POA: Diagnosis not present

## 2020-10-21 DIAGNOSIS — I872 Venous insufficiency (chronic) (peripheral): Secondary | ICD-10-CM | POA: Diagnosis not present

## 2020-10-21 DIAGNOSIS — L97813 Non-pressure chronic ulcer of other part of right lower leg with necrosis of muscle: Secondary | ICD-10-CM | POA: Diagnosis not present

## 2020-10-21 DIAGNOSIS — M150 Primary generalized (osteo)arthritis: Secondary | ICD-10-CM | POA: Diagnosis not present

## 2020-10-25 DIAGNOSIS — L97813 Non-pressure chronic ulcer of other part of right lower leg with necrosis of muscle: Secondary | ICD-10-CM | POA: Diagnosis not present

## 2020-10-25 DIAGNOSIS — L97521 Non-pressure chronic ulcer of other part of left foot limited to breakdown of skin: Secondary | ICD-10-CM | POA: Diagnosis not present

## 2020-10-25 DIAGNOSIS — L97322 Non-pressure chronic ulcer of left ankle with fat layer exposed: Secondary | ICD-10-CM | POA: Diagnosis not present

## 2020-10-25 DIAGNOSIS — M150 Primary generalized (osteo)arthritis: Secondary | ICD-10-CM | POA: Diagnosis not present

## 2020-10-25 DIAGNOSIS — I872 Venous insufficiency (chronic) (peripheral): Secondary | ICD-10-CM | POA: Diagnosis not present

## 2020-10-25 DIAGNOSIS — I10 Essential (primary) hypertension: Secondary | ICD-10-CM | POA: Diagnosis not present

## 2020-10-27 DIAGNOSIS — I10 Essential (primary) hypertension: Secondary | ICD-10-CM | POA: Diagnosis not present

## 2020-10-27 DIAGNOSIS — L97521 Non-pressure chronic ulcer of other part of left foot limited to breakdown of skin: Secondary | ICD-10-CM | POA: Diagnosis not present

## 2020-10-27 DIAGNOSIS — Z8614 Personal history of Methicillin resistant Staphylococcus aureus infection: Secondary | ICD-10-CM | POA: Diagnosis not present

## 2020-10-27 DIAGNOSIS — I890 Lymphedema, not elsewhere classified: Secondary | ICD-10-CM | POA: Diagnosis not present

## 2020-10-27 DIAGNOSIS — L97911 Non-pressure chronic ulcer of unspecified part of right lower leg limited to breakdown of skin: Secondary | ICD-10-CM | POA: Diagnosis not present

## 2020-11-01 DIAGNOSIS — L97529 Non-pressure chronic ulcer of other part of left foot with unspecified severity: Secondary | ICD-10-CM | POA: Diagnosis not present

## 2020-11-01 DIAGNOSIS — Z48817 Encounter for surgical aftercare following surgery on the skin and subcutaneous tissue: Secondary | ICD-10-CM | POA: Diagnosis not present

## 2020-11-01 DIAGNOSIS — Z23 Encounter for immunization: Secondary | ICD-10-CM | POA: Diagnosis not present

## 2020-11-01 DIAGNOSIS — I890 Lymphedema, not elsewhere classified: Secondary | ICD-10-CM | POA: Diagnosis not present

## 2020-11-01 DIAGNOSIS — L97519 Non-pressure chronic ulcer of other part of right foot with unspecified severity: Secondary | ICD-10-CM | POA: Diagnosis not present

## 2020-11-01 DIAGNOSIS — Z20822 Contact with and (suspected) exposure to covid-19: Secondary | ICD-10-CM | POA: Diagnosis not present

## 2020-11-01 DIAGNOSIS — M7989 Other specified soft tissue disorders: Secondary | ICD-10-CM | POA: Diagnosis not present

## 2020-11-01 DIAGNOSIS — Z87891 Personal history of nicotine dependence: Secondary | ICD-10-CM | POA: Diagnosis not present

## 2020-11-01 DIAGNOSIS — D72819 Decreased white blood cell count, unspecified: Secondary | ICD-10-CM | POA: Diagnosis not present

## 2020-11-01 DIAGNOSIS — G8929 Other chronic pain: Secondary | ICD-10-CM | POA: Diagnosis not present

## 2020-11-01 DIAGNOSIS — Z886 Allergy status to analgesic agent status: Secondary | ICD-10-CM | POA: Diagnosis not present

## 2020-11-01 DIAGNOSIS — M6281 Muscle weakness (generalized): Secondary | ICD-10-CM | POA: Diagnosis not present

## 2020-11-01 DIAGNOSIS — Z1159 Encounter for screening for other viral diseases: Secondary | ICD-10-CM | POA: Diagnosis not present

## 2020-11-01 DIAGNOSIS — R279 Unspecified lack of coordination: Secondary | ICD-10-CM | POA: Diagnosis not present

## 2020-11-01 DIAGNOSIS — L97929 Non-pressure chronic ulcer of unspecified part of left lower leg with unspecified severity: Secondary | ICD-10-CM | POA: Diagnosis not present

## 2020-11-01 DIAGNOSIS — E441 Mild protein-calorie malnutrition: Secondary | ICD-10-CM | POA: Diagnosis not present

## 2020-11-01 DIAGNOSIS — Z791 Long term (current) use of non-steroidal anti-inflammatories (NSAID): Secondary | ICD-10-CM | POA: Diagnosis not present

## 2020-11-01 DIAGNOSIS — Z91018 Allergy to other foods: Secondary | ICD-10-CM | POA: Diagnosis not present

## 2020-11-01 DIAGNOSIS — K219 Gastro-esophageal reflux disease without esophagitis: Secondary | ICD-10-CM | POA: Diagnosis not present

## 2020-11-01 DIAGNOSIS — I878 Other specified disorders of veins: Secondary | ICD-10-CM | POA: Diagnosis not present

## 2020-11-01 DIAGNOSIS — R5381 Other malaise: Secondary | ICD-10-CM | POA: Diagnosis not present

## 2020-11-01 DIAGNOSIS — L97829 Non-pressure chronic ulcer of other part of left lower leg with unspecified severity: Secondary | ICD-10-CM | POA: Diagnosis not present

## 2020-11-01 DIAGNOSIS — I10 Essential (primary) hypertension: Secondary | ICD-10-CM | POA: Diagnosis not present

## 2020-11-01 DIAGNOSIS — E785 Hyperlipidemia, unspecified: Secondary | ICD-10-CM | POA: Diagnosis not present

## 2020-11-01 DIAGNOSIS — I7389 Other specified peripheral vascular diseases: Secondary | ICD-10-CM | POA: Diagnosis not present

## 2020-11-01 DIAGNOSIS — L97819 Non-pressure chronic ulcer of other part of right lower leg with unspecified severity: Secondary | ICD-10-CM | POA: Diagnosis not present

## 2020-11-01 DIAGNOSIS — Z6828 Body mass index (BMI) 28.0-28.9, adult: Secondary | ICD-10-CM | POA: Diagnosis not present

## 2020-11-01 DIAGNOSIS — M810 Age-related osteoporosis without current pathological fracture: Secondary | ICD-10-CM | POA: Diagnosis not present

## 2020-11-01 DIAGNOSIS — Z7983 Long term (current) use of bisphosphonates: Secondary | ICD-10-CM | POA: Diagnosis not present

## 2020-11-01 DIAGNOSIS — Z9181 History of falling: Secondary | ICD-10-CM | POA: Diagnosis not present

## 2020-11-01 DIAGNOSIS — Z880 Allergy status to penicillin: Secondary | ICD-10-CM | POA: Diagnosis not present

## 2020-11-01 DIAGNOSIS — R488 Other symbolic dysfunctions: Secondary | ICD-10-CM | POA: Diagnosis not present

## 2020-11-01 DIAGNOSIS — Z882 Allergy status to sulfonamides status: Secondary | ICD-10-CM | POA: Diagnosis not present

## 2020-11-01 DIAGNOSIS — M170 Bilateral primary osteoarthritis of knee: Secondary | ICD-10-CM | POA: Diagnosis not present

## 2020-11-01 DIAGNOSIS — L97919 Non-pressure chronic ulcer of unspecified part of right lower leg with unspecified severity: Secondary | ICD-10-CM | POA: Diagnosis not present

## 2020-11-01 DIAGNOSIS — I872 Venous insufficiency (chronic) (peripheral): Secondary | ICD-10-CM | POA: Diagnosis not present

## 2020-11-01 DIAGNOSIS — G894 Chronic pain syndrome: Secondary | ICD-10-CM | POA: Diagnosis not present

## 2020-11-01 DIAGNOSIS — I83018 Varicose veins of right lower extremity with ulcer other part of lower leg: Secondary | ICD-10-CM | POA: Diagnosis not present

## 2020-11-01 DIAGNOSIS — B9562 Methicillin resistant Staphylococcus aureus infection as the cause of diseases classified elsewhere: Secondary | ICD-10-CM | POA: Diagnosis not present

## 2020-11-04 DIAGNOSIS — I83019 Varicose veins of right lower extremity with ulcer of unspecified site: Secondary | ICD-10-CM | POA: Diagnosis not present

## 2020-11-04 DIAGNOSIS — M170 Bilateral primary osteoarthritis of knee: Secondary | ICD-10-CM | POA: Diagnosis not present

## 2020-11-04 DIAGNOSIS — L0390 Cellulitis, unspecified: Secondary | ICD-10-CM | POA: Diagnosis not present

## 2020-11-04 DIAGNOSIS — L97919 Non-pressure chronic ulcer of unspecified part of right lower leg with unspecified severity: Secondary | ICD-10-CM | POA: Diagnosis not present

## 2020-11-04 DIAGNOSIS — Z23 Encounter for immunization: Secondary | ICD-10-CM | POA: Diagnosis not present

## 2020-11-04 DIAGNOSIS — Z79899 Other long term (current) drug therapy: Secondary | ICD-10-CM | POA: Diagnosis not present

## 2020-11-04 DIAGNOSIS — L97519 Non-pressure chronic ulcer of other part of right foot with unspecified severity: Secondary | ICD-10-CM | POA: Diagnosis not present

## 2020-11-04 DIAGNOSIS — R531 Weakness: Secondary | ICD-10-CM | POA: Diagnosis not present

## 2020-11-04 DIAGNOSIS — I890 Lymphedema, not elsewhere classified: Secondary | ICD-10-CM | POA: Diagnosis not present

## 2020-11-04 DIAGNOSIS — M25561 Pain in right knee: Secondary | ICD-10-CM | POA: Diagnosis not present

## 2020-11-04 DIAGNOSIS — I87312 Chronic venous hypertension (idiopathic) with ulcer of left lower extremity: Secondary | ICD-10-CM | POA: Diagnosis not present

## 2020-11-04 DIAGNOSIS — B9562 Methicillin resistant Staphylococcus aureus infection as the cause of diseases classified elsewhere: Secondary | ICD-10-CM | POA: Diagnosis not present

## 2020-11-04 DIAGNOSIS — I872 Venous insufficiency (chronic) (peripheral): Secondary | ICD-10-CM | POA: Diagnosis not present

## 2020-11-04 DIAGNOSIS — L97529 Non-pressure chronic ulcer of other part of left foot with unspecified severity: Secondary | ICD-10-CM | POA: Diagnosis not present

## 2020-11-04 DIAGNOSIS — R279 Unspecified lack of coordination: Secondary | ICD-10-CM | POA: Diagnosis not present

## 2020-11-04 DIAGNOSIS — T148XXA Other injury of unspecified body region, initial encounter: Secondary | ICD-10-CM | POA: Diagnosis not present

## 2020-11-04 DIAGNOSIS — Z1159 Encounter for screening for other viral diseases: Secondary | ICD-10-CM | POA: Diagnosis not present

## 2020-11-04 DIAGNOSIS — L97521 Non-pressure chronic ulcer of other part of left foot limited to breakdown of skin: Secondary | ICD-10-CM | POA: Diagnosis not present

## 2020-11-04 DIAGNOSIS — G894 Chronic pain syndrome: Secondary | ICD-10-CM | POA: Diagnosis not present

## 2020-11-04 DIAGNOSIS — Z22322 Carrier or suspected carrier of Methicillin resistant Staphylococcus aureus: Secondary | ICD-10-CM | POA: Diagnosis not present

## 2020-11-04 DIAGNOSIS — M25562 Pain in left knee: Secondary | ICD-10-CM | POA: Diagnosis not present

## 2020-11-04 DIAGNOSIS — L97329 Non-pressure chronic ulcer of left ankle with unspecified severity: Secondary | ICD-10-CM | POA: Diagnosis not present

## 2020-11-04 DIAGNOSIS — G8929 Other chronic pain: Secondary | ICD-10-CM | POA: Diagnosis not present

## 2020-11-04 DIAGNOSIS — R5381 Other malaise: Secondary | ICD-10-CM | POA: Diagnosis not present

## 2020-11-04 DIAGNOSIS — L97929 Non-pressure chronic ulcer of unspecified part of left lower leg with unspecified severity: Secondary | ICD-10-CM | POA: Diagnosis not present

## 2020-11-04 DIAGNOSIS — I83029 Varicose veins of left lower extremity with ulcer of unspecified site: Secondary | ICD-10-CM | POA: Diagnosis not present

## 2020-11-04 DIAGNOSIS — R062 Wheezing: Secondary | ICD-10-CM | POA: Diagnosis not present

## 2020-11-04 DIAGNOSIS — L97928 Non-pressure chronic ulcer of unspecified part of left lower leg with other specified severity: Secondary | ICD-10-CM | POA: Diagnosis not present

## 2020-11-04 DIAGNOSIS — R488 Other symbolic dysfunctions: Secondary | ICD-10-CM | POA: Diagnosis not present

## 2020-11-04 DIAGNOSIS — M6281 Muscle weakness (generalized): Secondary | ICD-10-CM | POA: Diagnosis not present

## 2020-11-04 DIAGNOSIS — Z20828 Contact with and (suspected) exposure to other viral communicable diseases: Secondary | ICD-10-CM | POA: Diagnosis not present

## 2020-11-04 DIAGNOSIS — M810 Age-related osteoporosis without current pathological fracture: Secondary | ICD-10-CM | POA: Diagnosis not present

## 2020-11-04 DIAGNOSIS — K219 Gastro-esophageal reflux disease without esophagitis: Secondary | ICD-10-CM | POA: Diagnosis not present

## 2020-11-04 DIAGNOSIS — A4902 Methicillin resistant Staphylococcus aureus infection, unspecified site: Secondary | ICD-10-CM | POA: Diagnosis not present

## 2020-11-04 DIAGNOSIS — E785 Hyperlipidemia, unspecified: Secondary | ICD-10-CM | POA: Diagnosis not present

## 2020-11-04 DIAGNOSIS — I517 Cardiomegaly: Secondary | ICD-10-CM | POA: Diagnosis not present

## 2020-11-04 DIAGNOSIS — Z9181 History of falling: Secondary | ICD-10-CM | POA: Diagnosis not present

## 2020-11-04 DIAGNOSIS — I87313 Chronic venous hypertension (idiopathic) with ulcer of bilateral lower extremity: Secondary | ICD-10-CM | POA: Diagnosis not present

## 2020-11-04 DIAGNOSIS — I7389 Other specified peripheral vascular diseases: Secondary | ICD-10-CM | POA: Diagnosis not present

## 2020-11-04 DIAGNOSIS — Z48817 Encounter for surgical aftercare following surgery on the skin and subcutaneous tissue: Secondary | ICD-10-CM | POA: Diagnosis not present

## 2020-11-04 DIAGNOSIS — I10 Essential (primary) hypertension: Secondary | ICD-10-CM | POA: Diagnosis not present

## 2020-11-05 DIAGNOSIS — L97919 Non-pressure chronic ulcer of unspecified part of right lower leg with unspecified severity: Secondary | ICD-10-CM | POA: Diagnosis not present

## 2020-11-05 DIAGNOSIS — I83019 Varicose veins of right lower extremity with ulcer of unspecified site: Secondary | ICD-10-CM | POA: Diagnosis not present

## 2020-11-05 DIAGNOSIS — L97929 Non-pressure chronic ulcer of unspecified part of left lower leg with unspecified severity: Secondary | ICD-10-CM | POA: Diagnosis not present

## 2020-11-05 DIAGNOSIS — I83029 Varicose veins of left lower extremity with ulcer of unspecified site: Secondary | ICD-10-CM | POA: Diagnosis not present

## 2020-11-09 DIAGNOSIS — Z22322 Carrier or suspected carrier of Methicillin resistant Staphylococcus aureus: Secondary | ICD-10-CM | POA: Diagnosis not present

## 2020-11-09 DIAGNOSIS — L97919 Non-pressure chronic ulcer of unspecified part of right lower leg with unspecified severity: Secondary | ICD-10-CM | POA: Diagnosis not present

## 2020-11-09 DIAGNOSIS — I890 Lymphedema, not elsewhere classified: Secondary | ICD-10-CM | POA: Diagnosis not present

## 2020-11-09 DIAGNOSIS — I10 Essential (primary) hypertension: Secondary | ICD-10-CM | POA: Diagnosis not present

## 2020-11-10 DIAGNOSIS — I10 Essential (primary) hypertension: Secondary | ICD-10-CM | POA: Diagnosis not present

## 2020-11-10 DIAGNOSIS — L97919 Non-pressure chronic ulcer of unspecified part of right lower leg with unspecified severity: Secondary | ICD-10-CM | POA: Diagnosis not present

## 2020-11-10 DIAGNOSIS — Z22322 Carrier or suspected carrier of Methicillin resistant Staphylococcus aureus: Secondary | ICD-10-CM | POA: Diagnosis not present

## 2020-11-10 DIAGNOSIS — I890 Lymphedema, not elsewhere classified: Secondary | ICD-10-CM | POA: Diagnosis not present

## 2020-11-12 DIAGNOSIS — I87313 Chronic venous hypertension (idiopathic) with ulcer of bilateral lower extremity: Secondary | ICD-10-CM | POA: Diagnosis not present

## 2020-11-15 DIAGNOSIS — R062 Wheezing: Secondary | ICD-10-CM | POA: Diagnosis not present

## 2020-11-22 DIAGNOSIS — I87313 Chronic venous hypertension (idiopathic) with ulcer of bilateral lower extremity: Secondary | ICD-10-CM | POA: Diagnosis not present

## 2020-11-25 DIAGNOSIS — T148XXA Other injury of unspecified body region, initial encounter: Secondary | ICD-10-CM | POA: Diagnosis not present

## 2020-11-29 DIAGNOSIS — L0390 Cellulitis, unspecified: Secondary | ICD-10-CM | POA: Diagnosis not present

## 2020-11-29 DIAGNOSIS — I87313 Chronic venous hypertension (idiopathic) with ulcer of bilateral lower extremity: Secondary | ICD-10-CM | POA: Diagnosis not present

## 2020-12-02 DIAGNOSIS — I10 Essential (primary) hypertension: Secondary | ICD-10-CM | POA: Diagnosis not present

## 2020-12-02 DIAGNOSIS — M25562 Pain in left knee: Secondary | ICD-10-CM | POA: Diagnosis not present

## 2020-12-02 DIAGNOSIS — I890 Lymphedema, not elsewhere classified: Secondary | ICD-10-CM | POA: Diagnosis not present

## 2020-12-02 DIAGNOSIS — M25561 Pain in right knee: Secondary | ICD-10-CM | POA: Diagnosis not present

## 2020-12-02 DIAGNOSIS — G8929 Other chronic pain: Secondary | ICD-10-CM | POA: Diagnosis not present

## 2020-12-02 DIAGNOSIS — I872 Venous insufficiency (chronic) (peripheral): Secondary | ICD-10-CM | POA: Diagnosis not present

## 2020-12-02 DIAGNOSIS — L97529 Non-pressure chronic ulcer of other part of left foot with unspecified severity: Secondary | ICD-10-CM | POA: Diagnosis not present

## 2020-12-02 DIAGNOSIS — L97329 Non-pressure chronic ulcer of left ankle with unspecified severity: Secondary | ICD-10-CM | POA: Diagnosis not present

## 2020-12-06 DIAGNOSIS — I87312 Chronic venous hypertension (idiopathic) with ulcer of left lower extremity: Secondary | ICD-10-CM | POA: Diagnosis not present

## 2020-12-06 DIAGNOSIS — L97529 Non-pressure chronic ulcer of other part of left foot with unspecified severity: Secondary | ICD-10-CM | POA: Diagnosis not present

## 2020-12-07 DIAGNOSIS — Z1159 Encounter for screening for other viral diseases: Secondary | ICD-10-CM | POA: Diagnosis not present

## 2020-12-07 DIAGNOSIS — M6281 Muscle weakness (generalized): Secondary | ICD-10-CM | POA: Diagnosis not present

## 2020-12-07 DIAGNOSIS — L97529 Non-pressure chronic ulcer of other part of left foot with unspecified severity: Secondary | ICD-10-CM | POA: Diagnosis not present

## 2020-12-07 DIAGNOSIS — Z23 Encounter for immunization: Secondary | ICD-10-CM | POA: Diagnosis not present

## 2020-12-07 DIAGNOSIS — R488 Other symbolic dysfunctions: Secondary | ICD-10-CM | POA: Diagnosis not present

## 2020-12-07 DIAGNOSIS — L97929 Non-pressure chronic ulcer of unspecified part of left lower leg with unspecified severity: Secondary | ICD-10-CM | POA: Diagnosis not present

## 2020-12-13 DIAGNOSIS — A4902 Methicillin resistant Staphylococcus aureus infection, unspecified site: Secondary | ICD-10-CM | POA: Diagnosis not present

## 2020-12-13 DIAGNOSIS — R531 Weakness: Secondary | ICD-10-CM | POA: Diagnosis not present

## 2020-12-13 DIAGNOSIS — I87312 Chronic venous hypertension (idiopathic) with ulcer of left lower extremity: Secondary | ICD-10-CM | POA: Diagnosis not present

## 2020-12-13 DIAGNOSIS — L97529 Non-pressure chronic ulcer of other part of left foot with unspecified severity: Secondary | ICD-10-CM | POA: Diagnosis not present

## 2020-12-14 DIAGNOSIS — L97919 Non-pressure chronic ulcer of unspecified part of right lower leg with unspecified severity: Secondary | ICD-10-CM | POA: Diagnosis not present

## 2020-12-14 DIAGNOSIS — I83019 Varicose veins of right lower extremity with ulcer of unspecified site: Secondary | ICD-10-CM | POA: Diagnosis not present

## 2020-12-14 DIAGNOSIS — Z22322 Carrier or suspected carrier of Methicillin resistant Staphylococcus aureus: Secondary | ICD-10-CM | POA: Diagnosis not present

## 2020-12-14 DIAGNOSIS — I83029 Varicose veins of left lower extremity with ulcer of unspecified site: Secondary | ICD-10-CM | POA: Diagnosis not present

## 2020-12-20 DIAGNOSIS — I83029 Varicose veins of left lower extremity with ulcer of unspecified site: Secondary | ICD-10-CM | POA: Diagnosis not present

## 2020-12-20 DIAGNOSIS — L97529 Non-pressure chronic ulcer of other part of left foot with unspecified severity: Secondary | ICD-10-CM | POA: Diagnosis not present

## 2020-12-20 DIAGNOSIS — L97929 Non-pressure chronic ulcer of unspecified part of left lower leg with unspecified severity: Secondary | ICD-10-CM | POA: Diagnosis not present

## 2020-12-20 DIAGNOSIS — L97919 Non-pressure chronic ulcer of unspecified part of right lower leg with unspecified severity: Secondary | ICD-10-CM | POA: Diagnosis not present

## 2020-12-20 DIAGNOSIS — I87312 Chronic venous hypertension (idiopathic) with ulcer of left lower extremity: Secondary | ICD-10-CM | POA: Diagnosis not present

## 2020-12-20 DIAGNOSIS — I83019 Varicose veins of right lower extremity with ulcer of unspecified site: Secondary | ICD-10-CM | POA: Diagnosis not present

## 2020-12-21 DIAGNOSIS — Z20828 Contact with and (suspected) exposure to other viral communicable diseases: Secondary | ICD-10-CM | POA: Diagnosis not present

## 2020-12-21 DIAGNOSIS — R531 Weakness: Secondary | ICD-10-CM | POA: Diagnosis not present

## 2020-12-23 DIAGNOSIS — L97929 Non-pressure chronic ulcer of unspecified part of left lower leg with unspecified severity: Secondary | ICD-10-CM | POA: Diagnosis not present

## 2020-12-23 DIAGNOSIS — G894 Chronic pain syndrome: Secondary | ICD-10-CM | POA: Diagnosis not present

## 2020-12-23 DIAGNOSIS — L97919 Non-pressure chronic ulcer of unspecified part of right lower leg with unspecified severity: Secondary | ICD-10-CM | POA: Diagnosis not present

## 2020-12-23 DIAGNOSIS — L97519 Non-pressure chronic ulcer of other part of right foot with unspecified severity: Secondary | ICD-10-CM | POA: Diagnosis not present

## 2020-12-23 DIAGNOSIS — I10 Essential (primary) hypertension: Secondary | ICD-10-CM | POA: Diagnosis not present

## 2020-12-23 DIAGNOSIS — E785 Hyperlipidemia, unspecified: Secondary | ICD-10-CM | POA: Diagnosis not present

## 2020-12-23 DIAGNOSIS — I87312 Chronic venous hypertension (idiopathic) with ulcer of left lower extremity: Secondary | ICD-10-CM | POA: Diagnosis not present

## 2020-12-23 DIAGNOSIS — Z48817 Encounter for surgical aftercare following surgery on the skin and subcutaneous tissue: Secondary | ICD-10-CM | POA: Diagnosis not present

## 2020-12-23 DIAGNOSIS — L97928 Non-pressure chronic ulcer of unspecified part of left lower leg with other specified severity: Secondary | ICD-10-CM | POA: Diagnosis not present

## 2020-12-23 DIAGNOSIS — R488 Other symbolic dysfunctions: Secondary | ICD-10-CM | POA: Diagnosis not present

## 2020-12-23 DIAGNOSIS — R531 Weakness: Secondary | ICD-10-CM | POA: Diagnosis not present

## 2020-12-23 DIAGNOSIS — I83029 Varicose veins of left lower extremity with ulcer of unspecified site: Secondary | ICD-10-CM | POA: Diagnosis not present

## 2020-12-23 DIAGNOSIS — L97521 Non-pressure chronic ulcer of other part of left foot limited to breakdown of skin: Secondary | ICD-10-CM | POA: Diagnosis not present

## 2020-12-23 DIAGNOSIS — Z9181 History of falling: Secondary | ICD-10-CM | POA: Diagnosis not present

## 2020-12-23 DIAGNOSIS — G8929 Other chronic pain: Secondary | ICD-10-CM | POA: Diagnosis not present

## 2020-12-23 DIAGNOSIS — Z79899 Other long term (current) drug therapy: Secondary | ICD-10-CM | POA: Diagnosis not present

## 2020-12-23 DIAGNOSIS — M170 Bilateral primary osteoarthritis of knee: Secondary | ICD-10-CM | POA: Diagnosis not present

## 2020-12-23 DIAGNOSIS — L97529 Non-pressure chronic ulcer of other part of left foot with unspecified severity: Secondary | ICD-10-CM | POA: Diagnosis not present

## 2020-12-23 DIAGNOSIS — I7389 Other specified peripheral vascular diseases: Secondary | ICD-10-CM | POA: Diagnosis not present

## 2020-12-23 DIAGNOSIS — I890 Lymphedema, not elsewhere classified: Secondary | ICD-10-CM | POA: Diagnosis not present

## 2020-12-23 DIAGNOSIS — I83019 Varicose veins of right lower extremity with ulcer of unspecified site: Secondary | ICD-10-CM | POA: Diagnosis not present

## 2020-12-23 DIAGNOSIS — Z22322 Carrier or suspected carrier of Methicillin resistant Staphylococcus aureus: Secondary | ICD-10-CM | POA: Diagnosis not present

## 2020-12-23 DIAGNOSIS — I872 Venous insufficiency (chronic) (peripheral): Secondary | ICD-10-CM | POA: Diagnosis not present

## 2020-12-23 DIAGNOSIS — M6281 Muscle weakness (generalized): Secondary | ICD-10-CM | POA: Diagnosis not present

## 2020-12-23 DIAGNOSIS — B9562 Methicillin resistant Staphylococcus aureus infection as the cause of diseases classified elsewhere: Secondary | ICD-10-CM | POA: Diagnosis not present

## 2020-12-23 DIAGNOSIS — K219 Gastro-esophageal reflux disease without esophagitis: Secondary | ICD-10-CM | POA: Diagnosis not present

## 2020-12-23 DIAGNOSIS — M810 Age-related osteoporosis without current pathological fracture: Secondary | ICD-10-CM | POA: Diagnosis not present

## 2020-12-27 DIAGNOSIS — L97521 Non-pressure chronic ulcer of other part of left foot limited to breakdown of skin: Secondary | ICD-10-CM | POA: Diagnosis not present

## 2020-12-27 DIAGNOSIS — L97529 Non-pressure chronic ulcer of other part of left foot with unspecified severity: Secondary | ICD-10-CM | POA: Diagnosis not present

## 2020-12-27 DIAGNOSIS — I87312 Chronic venous hypertension (idiopathic) with ulcer of left lower extremity: Secondary | ICD-10-CM | POA: Diagnosis not present

## 2020-12-30 DIAGNOSIS — I890 Lymphedema, not elsewhere classified: Secondary | ICD-10-CM | POA: Diagnosis not present

## 2020-12-30 DIAGNOSIS — L97929 Non-pressure chronic ulcer of unspecified part of left lower leg with unspecified severity: Secondary | ICD-10-CM | POA: Diagnosis not present

## 2020-12-30 DIAGNOSIS — I10 Essential (primary) hypertension: Secondary | ICD-10-CM | POA: Diagnosis not present

## 2020-12-30 DIAGNOSIS — G8929 Other chronic pain: Secondary | ICD-10-CM | POA: Diagnosis not present

## 2020-12-30 DIAGNOSIS — I83019 Varicose veins of right lower extremity with ulcer of unspecified site: Secondary | ICD-10-CM | POA: Diagnosis not present

## 2020-12-30 DIAGNOSIS — M810 Age-related osteoporosis without current pathological fracture: Secondary | ICD-10-CM | POA: Diagnosis not present

## 2020-12-30 DIAGNOSIS — I83029 Varicose veins of left lower extremity with ulcer of unspecified site: Secondary | ICD-10-CM | POA: Diagnosis not present

## 2020-12-30 DIAGNOSIS — L97919 Non-pressure chronic ulcer of unspecified part of right lower leg with unspecified severity: Secondary | ICD-10-CM | POA: Diagnosis not present

## 2021-01-03 DIAGNOSIS — R531 Weakness: Secondary | ICD-10-CM | POA: Diagnosis not present

## 2021-01-06 DIAGNOSIS — L97928 Non-pressure chronic ulcer of unspecified part of left lower leg with other specified severity: Secondary | ICD-10-CM | POA: Diagnosis not present

## 2021-01-06 DIAGNOSIS — I87312 Chronic venous hypertension (idiopathic) with ulcer of left lower extremity: Secondary | ICD-10-CM | POA: Diagnosis not present

## 2021-01-06 DIAGNOSIS — L97521 Non-pressure chronic ulcer of other part of left foot limited to breakdown of skin: Secondary | ICD-10-CM | POA: Diagnosis not present

## 2021-01-06 DIAGNOSIS — L97529 Non-pressure chronic ulcer of other part of left foot with unspecified severity: Secondary | ICD-10-CM | POA: Diagnosis not present

## 2021-01-07 DIAGNOSIS — R531 Weakness: Secondary | ICD-10-CM | POA: Diagnosis not present

## 2021-01-09 DIAGNOSIS — L97919 Non-pressure chronic ulcer of unspecified part of right lower leg with unspecified severity: Secondary | ICD-10-CM | POA: Diagnosis not present

## 2021-01-09 DIAGNOSIS — L97929 Non-pressure chronic ulcer of unspecified part of left lower leg with unspecified severity: Secondary | ICD-10-CM | POA: Diagnosis not present

## 2021-01-09 DIAGNOSIS — I83019 Varicose veins of right lower extremity with ulcer of unspecified site: Secondary | ICD-10-CM | POA: Diagnosis not present

## 2021-01-09 DIAGNOSIS — I83029 Varicose veins of left lower extremity with ulcer of unspecified site: Secondary | ICD-10-CM | POA: Diagnosis not present

## 2021-01-10 DIAGNOSIS — L97529 Non-pressure chronic ulcer of other part of left foot with unspecified severity: Secondary | ICD-10-CM | POA: Diagnosis not present

## 2021-01-10 DIAGNOSIS — L97521 Non-pressure chronic ulcer of other part of left foot limited to breakdown of skin: Secondary | ICD-10-CM | POA: Diagnosis not present

## 2021-01-10 DIAGNOSIS — L97928 Non-pressure chronic ulcer of unspecified part of left lower leg with other specified severity: Secondary | ICD-10-CM | POA: Diagnosis not present

## 2021-01-10 DIAGNOSIS — I87312 Chronic venous hypertension (idiopathic) with ulcer of left lower extremity: Secondary | ICD-10-CM | POA: Diagnosis not present

## 2021-01-12 DIAGNOSIS — L97919 Non-pressure chronic ulcer of unspecified part of right lower leg with unspecified severity: Secondary | ICD-10-CM | POA: Diagnosis not present

## 2021-01-12 DIAGNOSIS — I83029 Varicose veins of left lower extremity with ulcer of unspecified site: Secondary | ICD-10-CM | POA: Diagnosis not present

## 2021-01-12 DIAGNOSIS — I83019 Varicose veins of right lower extremity with ulcer of unspecified site: Secondary | ICD-10-CM | POA: Diagnosis not present

## 2021-01-12 DIAGNOSIS — Z22322 Carrier or suspected carrier of Methicillin resistant Staphylococcus aureus: Secondary | ICD-10-CM | POA: Diagnosis not present

## 2021-01-13 DIAGNOSIS — Z79899 Other long term (current) drug therapy: Secondary | ICD-10-CM | POA: Diagnosis not present

## 2021-01-14 DIAGNOSIS — L97919 Non-pressure chronic ulcer of unspecified part of right lower leg with unspecified severity: Secondary | ICD-10-CM | POA: Diagnosis not present

## 2021-01-14 DIAGNOSIS — I83019 Varicose veins of right lower extremity with ulcer of unspecified site: Secondary | ICD-10-CM | POA: Diagnosis not present

## 2021-01-14 DIAGNOSIS — Z22322 Carrier or suspected carrier of Methicillin resistant Staphylococcus aureus: Secondary | ICD-10-CM | POA: Diagnosis not present

## 2021-01-14 DIAGNOSIS — I83029 Varicose veins of left lower extremity with ulcer of unspecified site: Secondary | ICD-10-CM | POA: Diagnosis not present

## 2021-01-18 DIAGNOSIS — I83029 Varicose veins of left lower extremity with ulcer of unspecified site: Secondary | ICD-10-CM | POA: Diagnosis not present

## 2021-01-18 DIAGNOSIS — L97919 Non-pressure chronic ulcer of unspecified part of right lower leg with unspecified severity: Secondary | ICD-10-CM | POA: Diagnosis not present

## 2021-01-18 DIAGNOSIS — Z22322 Carrier or suspected carrier of Methicillin resistant Staphylococcus aureus: Secondary | ICD-10-CM | POA: Diagnosis not present

## 2021-01-18 DIAGNOSIS — I83019 Varicose veins of right lower extremity with ulcer of unspecified site: Secondary | ICD-10-CM | POA: Diagnosis not present

## 2021-01-19 DIAGNOSIS — I83019 Varicose veins of right lower extremity with ulcer of unspecified site: Secondary | ICD-10-CM | POA: Diagnosis not present

## 2021-01-19 DIAGNOSIS — L97929 Non-pressure chronic ulcer of unspecified part of left lower leg with unspecified severity: Secondary | ICD-10-CM | POA: Diagnosis not present

## 2021-01-19 DIAGNOSIS — L97919 Non-pressure chronic ulcer of unspecified part of right lower leg with unspecified severity: Secondary | ICD-10-CM | POA: Diagnosis not present

## 2021-01-19 DIAGNOSIS — Z22322 Carrier or suspected carrier of Methicillin resistant Staphylococcus aureus: Secondary | ICD-10-CM | POA: Diagnosis not present

## 2021-01-19 DIAGNOSIS — I83029 Varicose veins of left lower extremity with ulcer of unspecified site: Secondary | ICD-10-CM | POA: Diagnosis not present

## 2021-01-20 DIAGNOSIS — Z79891 Long term (current) use of opiate analgesic: Secondary | ICD-10-CM | POA: Diagnosis not present

## 2021-01-20 DIAGNOSIS — Z87891 Personal history of nicotine dependence: Secondary | ICD-10-CM | POA: Diagnosis not present

## 2021-01-20 DIAGNOSIS — Z8614 Personal history of Methicillin resistant Staphylococcus aureus infection: Secondary | ICD-10-CM | POA: Diagnosis not present

## 2021-01-20 DIAGNOSIS — E785 Hyperlipidemia, unspecified: Secondary | ICD-10-CM | POA: Diagnosis not present

## 2021-01-20 DIAGNOSIS — I10 Essential (primary) hypertension: Secondary | ICD-10-CM | POA: Diagnosis not present

## 2021-01-20 DIAGNOSIS — M810 Age-related osteoporosis without current pathological fracture: Secondary | ICD-10-CM | POA: Diagnosis not present

## 2021-01-20 DIAGNOSIS — Z791 Long term (current) use of non-steroidal anti-inflammatories (NSAID): Secondary | ICD-10-CM | POA: Diagnosis not present

## 2021-01-20 DIAGNOSIS — I872 Venous insufficiency (chronic) (peripheral): Secondary | ICD-10-CM | POA: Diagnosis not present

## 2021-01-20 DIAGNOSIS — L97812 Non-pressure chronic ulcer of other part of right lower leg with fat layer exposed: Secondary | ICD-10-CM | POA: Diagnosis not present

## 2021-01-20 DIAGNOSIS — L97522 Non-pressure chronic ulcer of other part of left foot with fat layer exposed: Secondary | ICD-10-CM | POA: Diagnosis not present

## 2021-01-20 DIAGNOSIS — Z9181 History of falling: Secondary | ICD-10-CM | POA: Diagnosis not present

## 2021-01-20 DIAGNOSIS — Z4800 Encounter for change or removal of nonsurgical wound dressing: Secondary | ICD-10-CM | POA: Diagnosis not present

## 2021-01-20 DIAGNOSIS — L97322 Non-pressure chronic ulcer of left ankle with fat layer exposed: Secondary | ICD-10-CM | POA: Diagnosis not present

## 2021-01-20 DIAGNOSIS — I890 Lymphedema, not elsewhere classified: Secondary | ICD-10-CM | POA: Diagnosis not present

## 2021-01-20 DIAGNOSIS — M170 Bilateral primary osteoarthritis of knee: Secondary | ICD-10-CM | POA: Diagnosis not present

## 2021-01-20 DIAGNOSIS — I7389 Other specified peripheral vascular diseases: Secondary | ICD-10-CM | POA: Diagnosis not present

## 2021-01-20 DIAGNOSIS — K219 Gastro-esophageal reflux disease without esophagitis: Secondary | ICD-10-CM | POA: Diagnosis not present

## 2021-01-20 DIAGNOSIS — G894 Chronic pain syndrome: Secondary | ICD-10-CM | POA: Diagnosis not present

## 2021-01-21 DIAGNOSIS — L97322 Non-pressure chronic ulcer of left ankle with fat layer exposed: Secondary | ICD-10-CM | POA: Diagnosis not present

## 2021-01-21 DIAGNOSIS — I872 Venous insufficiency (chronic) (peripheral): Secondary | ICD-10-CM | POA: Diagnosis not present

## 2021-01-21 DIAGNOSIS — I890 Lymphedema, not elsewhere classified: Secondary | ICD-10-CM | POA: Diagnosis not present

## 2021-01-21 DIAGNOSIS — L97522 Non-pressure chronic ulcer of other part of left foot with fat layer exposed: Secondary | ICD-10-CM | POA: Diagnosis not present

## 2021-01-21 DIAGNOSIS — L97812 Non-pressure chronic ulcer of other part of right lower leg with fat layer exposed: Secondary | ICD-10-CM | POA: Diagnosis not present

## 2021-01-21 DIAGNOSIS — I10 Essential (primary) hypertension: Secondary | ICD-10-CM | POA: Diagnosis not present

## 2021-01-25 DIAGNOSIS — L97322 Non-pressure chronic ulcer of left ankle with fat layer exposed: Secondary | ICD-10-CM | POA: Diagnosis not present

## 2021-01-25 DIAGNOSIS — I10 Essential (primary) hypertension: Secondary | ICD-10-CM | POA: Diagnosis not present

## 2021-01-25 DIAGNOSIS — I872 Venous insufficiency (chronic) (peripheral): Secondary | ICD-10-CM | POA: Diagnosis not present

## 2021-01-25 DIAGNOSIS — Z20828 Contact with and (suspected) exposure to other viral communicable diseases: Secondary | ICD-10-CM | POA: Diagnosis not present

## 2021-01-25 DIAGNOSIS — I890 Lymphedema, not elsewhere classified: Secondary | ICD-10-CM | POA: Diagnosis not present

## 2021-01-25 DIAGNOSIS — L97522 Non-pressure chronic ulcer of other part of left foot with fat layer exposed: Secondary | ICD-10-CM | POA: Diagnosis not present

## 2021-01-25 DIAGNOSIS — L97812 Non-pressure chronic ulcer of other part of right lower leg with fat layer exposed: Secondary | ICD-10-CM | POA: Diagnosis not present

## 2021-01-26 DIAGNOSIS — I890 Lymphedema, not elsewhere classified: Secondary | ICD-10-CM | POA: Diagnosis not present

## 2021-01-26 DIAGNOSIS — L97322 Non-pressure chronic ulcer of left ankle with fat layer exposed: Secondary | ICD-10-CM | POA: Diagnosis not present

## 2021-01-26 DIAGNOSIS — L97812 Non-pressure chronic ulcer of other part of right lower leg with fat layer exposed: Secondary | ICD-10-CM | POA: Diagnosis not present

## 2021-01-26 DIAGNOSIS — I10 Essential (primary) hypertension: Secondary | ICD-10-CM | POA: Diagnosis not present

## 2021-01-26 DIAGNOSIS — L97522 Non-pressure chronic ulcer of other part of left foot with fat layer exposed: Secondary | ICD-10-CM | POA: Diagnosis not present

## 2021-01-26 DIAGNOSIS — I872 Venous insufficiency (chronic) (peripheral): Secondary | ICD-10-CM | POA: Diagnosis not present

## 2021-01-27 DIAGNOSIS — I872 Venous insufficiency (chronic) (peripheral): Secondary | ICD-10-CM | POA: Diagnosis not present

## 2021-01-27 DIAGNOSIS — L97522 Non-pressure chronic ulcer of other part of left foot with fat layer exposed: Secondary | ICD-10-CM | POA: Diagnosis not present

## 2021-01-27 DIAGNOSIS — L97812 Non-pressure chronic ulcer of other part of right lower leg with fat layer exposed: Secondary | ICD-10-CM | POA: Diagnosis not present

## 2021-01-27 DIAGNOSIS — I10 Essential (primary) hypertension: Secondary | ICD-10-CM | POA: Diagnosis not present

## 2021-01-27 DIAGNOSIS — L97322 Non-pressure chronic ulcer of left ankle with fat layer exposed: Secondary | ICD-10-CM | POA: Diagnosis not present

## 2021-01-27 DIAGNOSIS — I890 Lymphedema, not elsewhere classified: Secondary | ICD-10-CM | POA: Diagnosis not present

## 2021-01-28 DIAGNOSIS — I10 Essential (primary) hypertension: Secondary | ICD-10-CM | POA: Diagnosis not present

## 2021-01-28 DIAGNOSIS — L97812 Non-pressure chronic ulcer of other part of right lower leg with fat layer exposed: Secondary | ICD-10-CM | POA: Diagnosis not present

## 2021-01-28 DIAGNOSIS — L97322 Non-pressure chronic ulcer of left ankle with fat layer exposed: Secondary | ICD-10-CM | POA: Diagnosis not present

## 2021-01-28 DIAGNOSIS — I872 Venous insufficiency (chronic) (peripheral): Secondary | ICD-10-CM | POA: Diagnosis not present

## 2021-01-28 DIAGNOSIS — L97522 Non-pressure chronic ulcer of other part of left foot with fat layer exposed: Secondary | ICD-10-CM | POA: Diagnosis not present

## 2021-01-28 DIAGNOSIS — I890 Lymphedema, not elsewhere classified: Secondary | ICD-10-CM | POA: Diagnosis not present

## 2021-01-29 DIAGNOSIS — I890 Lymphedema, not elsewhere classified: Secondary | ICD-10-CM | POA: Diagnosis not present

## 2021-01-29 DIAGNOSIS — L97812 Non-pressure chronic ulcer of other part of right lower leg with fat layer exposed: Secondary | ICD-10-CM | POA: Diagnosis not present

## 2021-01-29 DIAGNOSIS — I872 Venous insufficiency (chronic) (peripheral): Secondary | ICD-10-CM | POA: Diagnosis not present

## 2021-01-29 DIAGNOSIS — I10 Essential (primary) hypertension: Secondary | ICD-10-CM | POA: Diagnosis not present

## 2021-01-29 DIAGNOSIS — L97522 Non-pressure chronic ulcer of other part of left foot with fat layer exposed: Secondary | ICD-10-CM | POA: Diagnosis not present

## 2021-01-29 DIAGNOSIS — L97322 Non-pressure chronic ulcer of left ankle with fat layer exposed: Secondary | ICD-10-CM | POA: Diagnosis not present

## 2021-01-31 DIAGNOSIS — L97529 Non-pressure chronic ulcer of other part of left foot with unspecified severity: Secondary | ICD-10-CM | POA: Diagnosis not present

## 2021-01-31 DIAGNOSIS — L97329 Non-pressure chronic ulcer of left ankle with unspecified severity: Secondary | ICD-10-CM | POA: Diagnosis not present

## 2021-01-31 DIAGNOSIS — M1990 Unspecified osteoarthritis, unspecified site: Secondary | ICD-10-CM | POA: Diagnosis not present

## 2021-01-31 DIAGNOSIS — M810 Age-related osteoporosis without current pathological fracture: Secondary | ICD-10-CM | POA: Diagnosis not present

## 2021-01-31 DIAGNOSIS — I872 Venous insufficiency (chronic) (peripheral): Secondary | ICD-10-CM | POA: Diagnosis not present

## 2021-01-31 DIAGNOSIS — I890 Lymphedema, not elsewhere classified: Secondary | ICD-10-CM | POA: Diagnosis not present

## 2021-01-31 DIAGNOSIS — I83019 Varicose veins of right lower extremity with ulcer of unspecified site: Secondary | ICD-10-CM | POA: Diagnosis not present

## 2021-01-31 DIAGNOSIS — L97929 Non-pressure chronic ulcer of unspecified part of left lower leg with unspecified severity: Secondary | ICD-10-CM | POA: Diagnosis not present

## 2021-01-31 DIAGNOSIS — I10 Essential (primary) hypertension: Secondary | ICD-10-CM | POA: Diagnosis not present

## 2021-01-31 DIAGNOSIS — M170 Bilateral primary osteoarthritis of knee: Secondary | ICD-10-CM | POA: Diagnosis not present

## 2021-01-31 DIAGNOSIS — I83029 Varicose veins of left lower extremity with ulcer of unspecified site: Secondary | ICD-10-CM | POA: Diagnosis not present

## 2021-01-31 DIAGNOSIS — L97919 Non-pressure chronic ulcer of unspecified part of right lower leg with unspecified severity: Secondary | ICD-10-CM | POA: Diagnosis not present

## 2021-02-01 DIAGNOSIS — L97322 Non-pressure chronic ulcer of left ankle with fat layer exposed: Secondary | ICD-10-CM | POA: Diagnosis not present

## 2021-02-01 DIAGNOSIS — I890 Lymphedema, not elsewhere classified: Secondary | ICD-10-CM | POA: Diagnosis not present

## 2021-02-01 DIAGNOSIS — I10 Essential (primary) hypertension: Secondary | ICD-10-CM | POA: Diagnosis not present

## 2021-02-01 DIAGNOSIS — L97522 Non-pressure chronic ulcer of other part of left foot with fat layer exposed: Secondary | ICD-10-CM | POA: Diagnosis not present

## 2021-02-01 DIAGNOSIS — L97812 Non-pressure chronic ulcer of other part of right lower leg with fat layer exposed: Secondary | ICD-10-CM | POA: Diagnosis not present

## 2021-02-01 DIAGNOSIS — I872 Venous insufficiency (chronic) (peripheral): Secondary | ICD-10-CM | POA: Diagnosis not present

## 2021-02-02 DIAGNOSIS — L97522 Non-pressure chronic ulcer of other part of left foot with fat layer exposed: Secondary | ICD-10-CM | POA: Diagnosis not present

## 2021-02-02 DIAGNOSIS — I890 Lymphedema, not elsewhere classified: Secondary | ICD-10-CM | POA: Diagnosis not present

## 2021-02-02 DIAGNOSIS — L97322 Non-pressure chronic ulcer of left ankle with fat layer exposed: Secondary | ICD-10-CM | POA: Diagnosis not present

## 2021-02-02 DIAGNOSIS — I10 Essential (primary) hypertension: Secondary | ICD-10-CM | POA: Diagnosis not present

## 2021-02-02 DIAGNOSIS — I872 Venous insufficiency (chronic) (peripheral): Secondary | ICD-10-CM | POA: Diagnosis not present

## 2021-02-02 DIAGNOSIS — L97812 Non-pressure chronic ulcer of other part of right lower leg with fat layer exposed: Secondary | ICD-10-CM | POA: Diagnosis not present

## 2021-02-03 DIAGNOSIS — I872 Venous insufficiency (chronic) (peripheral): Secondary | ICD-10-CM | POA: Diagnosis not present

## 2021-02-03 DIAGNOSIS — L97322 Non-pressure chronic ulcer of left ankle with fat layer exposed: Secondary | ICD-10-CM | POA: Diagnosis not present

## 2021-02-03 DIAGNOSIS — L97522 Non-pressure chronic ulcer of other part of left foot with fat layer exposed: Secondary | ICD-10-CM | POA: Diagnosis not present

## 2021-02-03 DIAGNOSIS — L97812 Non-pressure chronic ulcer of other part of right lower leg with fat layer exposed: Secondary | ICD-10-CM | POA: Diagnosis not present

## 2021-02-03 DIAGNOSIS — I10 Essential (primary) hypertension: Secondary | ICD-10-CM | POA: Diagnosis not present

## 2021-02-03 DIAGNOSIS — I890 Lymphedema, not elsewhere classified: Secondary | ICD-10-CM | POA: Diagnosis not present

## 2021-02-04 DIAGNOSIS — L97812 Non-pressure chronic ulcer of other part of right lower leg with fat layer exposed: Secondary | ICD-10-CM | POA: Diagnosis not present

## 2021-02-04 DIAGNOSIS — L97522 Non-pressure chronic ulcer of other part of left foot with fat layer exposed: Secondary | ICD-10-CM | POA: Diagnosis not present

## 2021-02-04 DIAGNOSIS — I10 Essential (primary) hypertension: Secondary | ICD-10-CM | POA: Diagnosis not present

## 2021-02-04 DIAGNOSIS — I872 Venous insufficiency (chronic) (peripheral): Secondary | ICD-10-CM | POA: Diagnosis not present

## 2021-02-04 DIAGNOSIS — L97322 Non-pressure chronic ulcer of left ankle with fat layer exposed: Secondary | ICD-10-CM | POA: Diagnosis not present

## 2021-02-04 DIAGNOSIS — I890 Lymphedema, not elsewhere classified: Secondary | ICD-10-CM | POA: Diagnosis not present

## 2021-02-07 DIAGNOSIS — L97522 Non-pressure chronic ulcer of other part of left foot with fat layer exposed: Secondary | ICD-10-CM | POA: Diagnosis not present

## 2021-02-07 DIAGNOSIS — L97812 Non-pressure chronic ulcer of other part of right lower leg with fat layer exposed: Secondary | ICD-10-CM | POA: Diagnosis not present

## 2021-02-07 DIAGNOSIS — L97322 Non-pressure chronic ulcer of left ankle with fat layer exposed: Secondary | ICD-10-CM | POA: Diagnosis not present

## 2021-02-07 DIAGNOSIS — I890 Lymphedema, not elsewhere classified: Secondary | ICD-10-CM | POA: Diagnosis not present

## 2021-02-07 DIAGNOSIS — I10 Essential (primary) hypertension: Secondary | ICD-10-CM | POA: Diagnosis not present

## 2021-02-07 DIAGNOSIS — I872 Venous insufficiency (chronic) (peripheral): Secondary | ICD-10-CM | POA: Diagnosis not present

## 2021-02-08 DIAGNOSIS — I10 Essential (primary) hypertension: Secondary | ICD-10-CM | POA: Diagnosis not present

## 2021-02-08 DIAGNOSIS — L97322 Non-pressure chronic ulcer of left ankle with fat layer exposed: Secondary | ICD-10-CM | POA: Diagnosis not present

## 2021-02-08 DIAGNOSIS — L97812 Non-pressure chronic ulcer of other part of right lower leg with fat layer exposed: Secondary | ICD-10-CM | POA: Diagnosis not present

## 2021-02-08 DIAGNOSIS — I890 Lymphedema, not elsewhere classified: Secondary | ICD-10-CM | POA: Diagnosis not present

## 2021-02-08 DIAGNOSIS — L97522 Non-pressure chronic ulcer of other part of left foot with fat layer exposed: Secondary | ICD-10-CM | POA: Diagnosis not present

## 2021-02-08 DIAGNOSIS — I872 Venous insufficiency (chronic) (peripheral): Secondary | ICD-10-CM | POA: Diagnosis not present

## 2021-02-11 DIAGNOSIS — L97322 Non-pressure chronic ulcer of left ankle with fat layer exposed: Secondary | ICD-10-CM | POA: Diagnosis not present

## 2021-02-11 DIAGNOSIS — I872 Venous insufficiency (chronic) (peripheral): Secondary | ICD-10-CM | POA: Diagnosis not present

## 2021-02-11 DIAGNOSIS — I890 Lymphedema, not elsewhere classified: Secondary | ICD-10-CM | POA: Diagnosis not present

## 2021-02-11 DIAGNOSIS — L97812 Non-pressure chronic ulcer of other part of right lower leg with fat layer exposed: Secondary | ICD-10-CM | POA: Diagnosis not present

## 2021-02-11 DIAGNOSIS — I10 Essential (primary) hypertension: Secondary | ICD-10-CM | POA: Diagnosis not present

## 2021-02-11 DIAGNOSIS — L97522 Non-pressure chronic ulcer of other part of left foot with fat layer exposed: Secondary | ICD-10-CM | POA: Diagnosis not present

## 2021-02-15 DIAGNOSIS — I10 Essential (primary) hypertension: Secondary | ICD-10-CM | POA: Diagnosis not present

## 2021-02-15 DIAGNOSIS — I890 Lymphedema, not elsewhere classified: Secondary | ICD-10-CM | POA: Diagnosis not present

## 2021-02-15 DIAGNOSIS — L97322 Non-pressure chronic ulcer of left ankle with fat layer exposed: Secondary | ICD-10-CM | POA: Diagnosis not present

## 2021-02-15 DIAGNOSIS — L97522 Non-pressure chronic ulcer of other part of left foot with fat layer exposed: Secondary | ICD-10-CM | POA: Diagnosis not present

## 2021-02-15 DIAGNOSIS — I872 Venous insufficiency (chronic) (peripheral): Secondary | ICD-10-CM | POA: Diagnosis not present

## 2021-02-15 DIAGNOSIS — L97812 Non-pressure chronic ulcer of other part of right lower leg with fat layer exposed: Secondary | ICD-10-CM | POA: Diagnosis not present

## 2021-02-18 DIAGNOSIS — L97322 Non-pressure chronic ulcer of left ankle with fat layer exposed: Secondary | ICD-10-CM | POA: Diagnosis not present

## 2021-02-18 DIAGNOSIS — L97522 Non-pressure chronic ulcer of other part of left foot with fat layer exposed: Secondary | ICD-10-CM | POA: Diagnosis not present

## 2021-02-18 DIAGNOSIS — I890 Lymphedema, not elsewhere classified: Secondary | ICD-10-CM | POA: Diagnosis not present

## 2021-02-18 DIAGNOSIS — L97812 Non-pressure chronic ulcer of other part of right lower leg with fat layer exposed: Secondary | ICD-10-CM | POA: Diagnosis not present

## 2021-02-18 DIAGNOSIS — I872 Venous insufficiency (chronic) (peripheral): Secondary | ICD-10-CM | POA: Diagnosis not present

## 2021-02-18 DIAGNOSIS — I10 Essential (primary) hypertension: Secondary | ICD-10-CM | POA: Diagnosis not present

## 2021-02-19 DIAGNOSIS — E785 Hyperlipidemia, unspecified: Secondary | ICD-10-CM | POA: Diagnosis not present

## 2021-02-19 DIAGNOSIS — M810 Age-related osteoporosis without current pathological fracture: Secondary | ICD-10-CM | POA: Diagnosis not present

## 2021-02-19 DIAGNOSIS — I890 Lymphedema, not elsewhere classified: Secondary | ICD-10-CM | POA: Diagnosis not present

## 2021-02-19 DIAGNOSIS — G894 Chronic pain syndrome: Secondary | ICD-10-CM | POA: Diagnosis not present

## 2021-02-19 DIAGNOSIS — I7389 Other specified peripheral vascular diseases: Secondary | ICD-10-CM | POA: Diagnosis not present

## 2021-02-19 DIAGNOSIS — Z4800 Encounter for change or removal of nonsurgical wound dressing: Secondary | ICD-10-CM | POA: Diagnosis not present

## 2021-02-19 DIAGNOSIS — L97812 Non-pressure chronic ulcer of other part of right lower leg with fat layer exposed: Secondary | ICD-10-CM | POA: Diagnosis not present

## 2021-02-19 DIAGNOSIS — K219 Gastro-esophageal reflux disease without esophagitis: Secondary | ICD-10-CM | POA: Diagnosis not present

## 2021-02-19 DIAGNOSIS — Z8614 Personal history of Methicillin resistant Staphylococcus aureus infection: Secondary | ICD-10-CM | POA: Diagnosis not present

## 2021-02-19 DIAGNOSIS — Z791 Long term (current) use of non-steroidal anti-inflammatories (NSAID): Secondary | ICD-10-CM | POA: Diagnosis not present

## 2021-02-19 DIAGNOSIS — I10 Essential (primary) hypertension: Secondary | ICD-10-CM | POA: Diagnosis not present

## 2021-02-19 DIAGNOSIS — Z79891 Long term (current) use of opiate analgesic: Secondary | ICD-10-CM | POA: Diagnosis not present

## 2021-02-19 DIAGNOSIS — M170 Bilateral primary osteoarthritis of knee: Secondary | ICD-10-CM | POA: Diagnosis not present

## 2021-02-19 DIAGNOSIS — L97322 Non-pressure chronic ulcer of left ankle with fat layer exposed: Secondary | ICD-10-CM | POA: Diagnosis not present

## 2021-02-19 DIAGNOSIS — Z87891 Personal history of nicotine dependence: Secondary | ICD-10-CM | POA: Diagnosis not present

## 2021-02-19 DIAGNOSIS — L97522 Non-pressure chronic ulcer of other part of left foot with fat layer exposed: Secondary | ICD-10-CM | POA: Diagnosis not present

## 2021-02-19 DIAGNOSIS — Z9181 History of falling: Secondary | ICD-10-CM | POA: Diagnosis not present

## 2021-02-19 DIAGNOSIS — I872 Venous insufficiency (chronic) (peripheral): Secondary | ICD-10-CM | POA: Diagnosis not present

## 2021-02-22 DIAGNOSIS — I890 Lymphedema, not elsewhere classified: Secondary | ICD-10-CM | POA: Diagnosis not present

## 2021-02-22 DIAGNOSIS — I10 Essential (primary) hypertension: Secondary | ICD-10-CM | POA: Diagnosis not present

## 2021-02-22 DIAGNOSIS — L97322 Non-pressure chronic ulcer of left ankle with fat layer exposed: Secondary | ICD-10-CM | POA: Diagnosis not present

## 2021-02-22 DIAGNOSIS — I872 Venous insufficiency (chronic) (peripheral): Secondary | ICD-10-CM | POA: Diagnosis not present

## 2021-02-22 DIAGNOSIS — L97812 Non-pressure chronic ulcer of other part of right lower leg with fat layer exposed: Secondary | ICD-10-CM | POA: Diagnosis not present

## 2021-02-22 DIAGNOSIS — L97522 Non-pressure chronic ulcer of other part of left foot with fat layer exposed: Secondary | ICD-10-CM | POA: Diagnosis not present

## 2021-02-24 DIAGNOSIS — L600 Ingrowing nail: Secondary | ICD-10-CM | POA: Diagnosis not present

## 2021-02-24 DIAGNOSIS — L84 Corns and callosities: Secondary | ICD-10-CM | POA: Diagnosis not present

## 2021-02-24 DIAGNOSIS — L602 Onychogryphosis: Secondary | ICD-10-CM | POA: Diagnosis not present

## 2021-02-24 DIAGNOSIS — M79674 Pain in right toe(s): Secondary | ICD-10-CM | POA: Diagnosis not present

## 2021-02-24 DIAGNOSIS — B351 Tinea unguium: Secondary | ICD-10-CM | POA: Diagnosis not present

## 2021-02-24 DIAGNOSIS — M79675 Pain in left toe(s): Secondary | ICD-10-CM | POA: Diagnosis not present

## 2021-02-24 DIAGNOSIS — I739 Peripheral vascular disease, unspecified: Secondary | ICD-10-CM | POA: Diagnosis not present

## 2021-02-25 DIAGNOSIS — I890 Lymphedema, not elsewhere classified: Secondary | ICD-10-CM | POA: Diagnosis not present

## 2021-02-25 DIAGNOSIS — L97522 Non-pressure chronic ulcer of other part of left foot with fat layer exposed: Secondary | ICD-10-CM | POA: Diagnosis not present

## 2021-02-25 DIAGNOSIS — L97812 Non-pressure chronic ulcer of other part of right lower leg with fat layer exposed: Secondary | ICD-10-CM | POA: Diagnosis not present

## 2021-02-25 DIAGNOSIS — I10 Essential (primary) hypertension: Secondary | ICD-10-CM | POA: Diagnosis not present

## 2021-02-25 DIAGNOSIS — I872 Venous insufficiency (chronic) (peripheral): Secondary | ICD-10-CM | POA: Diagnosis not present

## 2021-02-25 DIAGNOSIS — L97322 Non-pressure chronic ulcer of left ankle with fat layer exposed: Secondary | ICD-10-CM | POA: Diagnosis not present

## 2021-03-01 DIAGNOSIS — L97812 Non-pressure chronic ulcer of other part of right lower leg with fat layer exposed: Secondary | ICD-10-CM | POA: Diagnosis not present

## 2021-03-01 DIAGNOSIS — L97522 Non-pressure chronic ulcer of other part of left foot with fat layer exposed: Secondary | ICD-10-CM | POA: Diagnosis not present

## 2021-03-01 DIAGNOSIS — I10 Essential (primary) hypertension: Secondary | ICD-10-CM | POA: Diagnosis not present

## 2021-03-01 DIAGNOSIS — I890 Lymphedema, not elsewhere classified: Secondary | ICD-10-CM | POA: Diagnosis not present

## 2021-03-01 DIAGNOSIS — L97322 Non-pressure chronic ulcer of left ankle with fat layer exposed: Secondary | ICD-10-CM | POA: Diagnosis not present

## 2021-03-01 DIAGNOSIS — I872 Venous insufficiency (chronic) (peripheral): Secondary | ICD-10-CM | POA: Diagnosis not present

## 2021-03-02 DIAGNOSIS — L97812 Non-pressure chronic ulcer of other part of right lower leg with fat layer exposed: Secondary | ICD-10-CM | POA: Diagnosis not present

## 2021-03-02 DIAGNOSIS — I10 Essential (primary) hypertension: Secondary | ICD-10-CM | POA: Diagnosis not present

## 2021-03-02 DIAGNOSIS — L97522 Non-pressure chronic ulcer of other part of left foot with fat layer exposed: Secondary | ICD-10-CM | POA: Diagnosis not present

## 2021-03-02 DIAGNOSIS — I890 Lymphedema, not elsewhere classified: Secondary | ICD-10-CM | POA: Diagnosis not present

## 2021-03-02 DIAGNOSIS — L97322 Non-pressure chronic ulcer of left ankle with fat layer exposed: Secondary | ICD-10-CM | POA: Diagnosis not present

## 2021-03-02 DIAGNOSIS — I872 Venous insufficiency (chronic) (peripheral): Secondary | ICD-10-CM | POA: Diagnosis not present

## 2021-03-04 DIAGNOSIS — I10 Essential (primary) hypertension: Secondary | ICD-10-CM | POA: Diagnosis not present

## 2021-03-04 DIAGNOSIS — L97322 Non-pressure chronic ulcer of left ankle with fat layer exposed: Secondary | ICD-10-CM | POA: Diagnosis not present

## 2021-03-04 DIAGNOSIS — I872 Venous insufficiency (chronic) (peripheral): Secondary | ICD-10-CM | POA: Diagnosis not present

## 2021-03-04 DIAGNOSIS — I890 Lymphedema, not elsewhere classified: Secondary | ICD-10-CM | POA: Diagnosis not present

## 2021-03-04 DIAGNOSIS — L97812 Non-pressure chronic ulcer of other part of right lower leg with fat layer exposed: Secondary | ICD-10-CM | POA: Diagnosis not present

## 2021-03-04 DIAGNOSIS — L97522 Non-pressure chronic ulcer of other part of left foot with fat layer exposed: Secondary | ICD-10-CM | POA: Diagnosis not present

## 2021-03-08 DIAGNOSIS — L97812 Non-pressure chronic ulcer of other part of right lower leg with fat layer exposed: Secondary | ICD-10-CM | POA: Diagnosis not present

## 2021-03-08 DIAGNOSIS — L97522 Non-pressure chronic ulcer of other part of left foot with fat layer exposed: Secondary | ICD-10-CM | POA: Diagnosis not present

## 2021-03-08 DIAGNOSIS — L97322 Non-pressure chronic ulcer of left ankle with fat layer exposed: Secondary | ICD-10-CM | POA: Diagnosis not present

## 2021-03-08 DIAGNOSIS — I872 Venous insufficiency (chronic) (peripheral): Secondary | ICD-10-CM | POA: Diagnosis not present

## 2021-03-08 DIAGNOSIS — I890 Lymphedema, not elsewhere classified: Secondary | ICD-10-CM | POA: Diagnosis not present

## 2021-03-08 DIAGNOSIS — I10 Essential (primary) hypertension: Secondary | ICD-10-CM | POA: Diagnosis not present

## 2021-03-11 DIAGNOSIS — L97322 Non-pressure chronic ulcer of left ankle with fat layer exposed: Secondary | ICD-10-CM | POA: Diagnosis not present

## 2021-03-11 DIAGNOSIS — I872 Venous insufficiency (chronic) (peripheral): Secondary | ICD-10-CM | POA: Diagnosis not present

## 2021-03-11 DIAGNOSIS — L97522 Non-pressure chronic ulcer of other part of left foot with fat layer exposed: Secondary | ICD-10-CM | POA: Diagnosis not present

## 2021-03-11 DIAGNOSIS — I890 Lymphedema, not elsewhere classified: Secondary | ICD-10-CM | POA: Diagnosis not present

## 2021-03-11 DIAGNOSIS — L97812 Non-pressure chronic ulcer of other part of right lower leg with fat layer exposed: Secondary | ICD-10-CM | POA: Diagnosis not present

## 2021-03-11 DIAGNOSIS — I10 Essential (primary) hypertension: Secondary | ICD-10-CM | POA: Diagnosis not present

## 2021-03-15 DIAGNOSIS — L97322 Non-pressure chronic ulcer of left ankle with fat layer exposed: Secondary | ICD-10-CM | POA: Diagnosis not present

## 2021-03-15 DIAGNOSIS — I10 Essential (primary) hypertension: Secondary | ICD-10-CM | POA: Diagnosis not present

## 2021-03-15 DIAGNOSIS — L97812 Non-pressure chronic ulcer of other part of right lower leg with fat layer exposed: Secondary | ICD-10-CM | POA: Diagnosis not present

## 2021-03-15 DIAGNOSIS — I890 Lymphedema, not elsewhere classified: Secondary | ICD-10-CM | POA: Diagnosis not present

## 2021-03-15 DIAGNOSIS — L97522 Non-pressure chronic ulcer of other part of left foot with fat layer exposed: Secondary | ICD-10-CM | POA: Diagnosis not present

## 2021-03-15 DIAGNOSIS — I872 Venous insufficiency (chronic) (peripheral): Secondary | ICD-10-CM | POA: Diagnosis not present

## 2021-03-16 DIAGNOSIS — L97522 Non-pressure chronic ulcer of other part of left foot with fat layer exposed: Secondary | ICD-10-CM | POA: Diagnosis not present

## 2021-03-16 DIAGNOSIS — I10 Essential (primary) hypertension: Secondary | ICD-10-CM | POA: Diagnosis not present

## 2021-03-16 DIAGNOSIS — L97322 Non-pressure chronic ulcer of left ankle with fat layer exposed: Secondary | ICD-10-CM | POA: Diagnosis not present

## 2021-03-16 DIAGNOSIS — I890 Lymphedema, not elsewhere classified: Secondary | ICD-10-CM | POA: Diagnosis not present

## 2021-03-16 DIAGNOSIS — L97812 Non-pressure chronic ulcer of other part of right lower leg with fat layer exposed: Secondary | ICD-10-CM | POA: Diagnosis not present

## 2021-03-16 DIAGNOSIS — I872 Venous insufficiency (chronic) (peripheral): Secondary | ICD-10-CM | POA: Diagnosis not present

## 2021-03-18 DIAGNOSIS — I10 Essential (primary) hypertension: Secondary | ICD-10-CM | POA: Diagnosis not present

## 2021-03-18 DIAGNOSIS — I872 Venous insufficiency (chronic) (peripheral): Secondary | ICD-10-CM | POA: Diagnosis not present

## 2021-03-18 DIAGNOSIS — L97812 Non-pressure chronic ulcer of other part of right lower leg with fat layer exposed: Secondary | ICD-10-CM | POA: Diagnosis not present

## 2021-03-18 DIAGNOSIS — I890 Lymphedema, not elsewhere classified: Secondary | ICD-10-CM | POA: Diagnosis not present

## 2021-03-18 DIAGNOSIS — L97322 Non-pressure chronic ulcer of left ankle with fat layer exposed: Secondary | ICD-10-CM | POA: Diagnosis not present

## 2021-03-18 DIAGNOSIS — L97522 Non-pressure chronic ulcer of other part of left foot with fat layer exposed: Secondary | ICD-10-CM | POA: Diagnosis not present

## 2021-03-21 DIAGNOSIS — Z9181 History of falling: Secondary | ICD-10-CM | POA: Diagnosis not present

## 2021-03-21 DIAGNOSIS — Z87891 Personal history of nicotine dependence: Secondary | ICD-10-CM | POA: Diagnosis not present

## 2021-03-21 DIAGNOSIS — M170 Bilateral primary osteoarthritis of knee: Secondary | ICD-10-CM | POA: Diagnosis not present

## 2021-03-21 DIAGNOSIS — Z791 Long term (current) use of non-steroidal anti-inflammatories (NSAID): Secondary | ICD-10-CM | POA: Diagnosis not present

## 2021-03-21 DIAGNOSIS — L97322 Non-pressure chronic ulcer of left ankle with fat layer exposed: Secondary | ICD-10-CM | POA: Diagnosis not present

## 2021-03-21 DIAGNOSIS — L97522 Non-pressure chronic ulcer of other part of left foot with fat layer exposed: Secondary | ICD-10-CM | POA: Diagnosis not present

## 2021-03-21 DIAGNOSIS — M810 Age-related osteoporosis without current pathological fracture: Secondary | ICD-10-CM | POA: Diagnosis not present

## 2021-03-21 DIAGNOSIS — I10 Essential (primary) hypertension: Secondary | ICD-10-CM | POA: Diagnosis not present

## 2021-03-21 DIAGNOSIS — E785 Hyperlipidemia, unspecified: Secondary | ICD-10-CM | POA: Diagnosis not present

## 2021-03-21 DIAGNOSIS — M6281 Muscle weakness (generalized): Secondary | ICD-10-CM | POA: Diagnosis not present

## 2021-03-21 DIAGNOSIS — I890 Lymphedema, not elsewhere classified: Secondary | ICD-10-CM | POA: Diagnosis not present

## 2021-03-21 DIAGNOSIS — Z8614 Personal history of Methicillin resistant Staphylococcus aureus infection: Secondary | ICD-10-CM | POA: Diagnosis not present

## 2021-03-21 DIAGNOSIS — I872 Venous insufficiency (chronic) (peripheral): Secondary | ICD-10-CM | POA: Diagnosis not present

## 2021-03-21 DIAGNOSIS — K219 Gastro-esophageal reflux disease without esophagitis: Secondary | ICD-10-CM | POA: Diagnosis not present

## 2021-03-21 DIAGNOSIS — L97812 Non-pressure chronic ulcer of other part of right lower leg with fat layer exposed: Secondary | ICD-10-CM | POA: Diagnosis not present

## 2021-03-21 DIAGNOSIS — R2689 Other abnormalities of gait and mobility: Secondary | ICD-10-CM | POA: Diagnosis not present

## 2021-03-21 DIAGNOSIS — G894 Chronic pain syndrome: Secondary | ICD-10-CM | POA: Diagnosis not present

## 2021-03-21 DIAGNOSIS — Z79891 Long term (current) use of opiate analgesic: Secondary | ICD-10-CM | POA: Diagnosis not present

## 2021-03-21 DIAGNOSIS — Z4800 Encounter for change or removal of nonsurgical wound dressing: Secondary | ICD-10-CM | POA: Diagnosis not present

## 2021-03-21 DIAGNOSIS — I7389 Other specified peripheral vascular diseases: Secondary | ICD-10-CM | POA: Diagnosis not present

## 2021-03-22 DIAGNOSIS — L97322 Non-pressure chronic ulcer of left ankle with fat layer exposed: Secondary | ICD-10-CM | POA: Diagnosis not present

## 2021-03-22 DIAGNOSIS — I890 Lymphedema, not elsewhere classified: Secondary | ICD-10-CM | POA: Diagnosis not present

## 2021-03-22 DIAGNOSIS — I872 Venous insufficiency (chronic) (peripheral): Secondary | ICD-10-CM | POA: Diagnosis not present

## 2021-03-22 DIAGNOSIS — L97812 Non-pressure chronic ulcer of other part of right lower leg with fat layer exposed: Secondary | ICD-10-CM | POA: Diagnosis not present

## 2021-03-22 DIAGNOSIS — Z4800 Encounter for change or removal of nonsurgical wound dressing: Secondary | ICD-10-CM | POA: Diagnosis not present

## 2021-03-22 DIAGNOSIS — L97522 Non-pressure chronic ulcer of other part of left foot with fat layer exposed: Secondary | ICD-10-CM | POA: Diagnosis not present

## 2021-03-25 DIAGNOSIS — L97812 Non-pressure chronic ulcer of other part of right lower leg with fat layer exposed: Secondary | ICD-10-CM | POA: Diagnosis not present

## 2021-03-25 DIAGNOSIS — L97522 Non-pressure chronic ulcer of other part of left foot with fat layer exposed: Secondary | ICD-10-CM | POA: Diagnosis not present

## 2021-03-25 DIAGNOSIS — I872 Venous insufficiency (chronic) (peripheral): Secondary | ICD-10-CM | POA: Diagnosis not present

## 2021-03-25 DIAGNOSIS — Z8739 Personal history of other diseases of the musculoskeletal system and connective tissue: Secondary | ICD-10-CM | POA: Diagnosis not present

## 2021-03-25 DIAGNOSIS — M170 Bilateral primary osteoarthritis of knee: Secondary | ICD-10-CM | POA: Diagnosis not present

## 2021-03-25 DIAGNOSIS — I890 Lymphedema, not elsewhere classified: Secondary | ICD-10-CM | POA: Diagnosis not present

## 2021-03-25 DIAGNOSIS — M898X8 Other specified disorders of bone, other site: Secondary | ICD-10-CM | POA: Diagnosis not present

## 2021-03-25 DIAGNOSIS — M25562 Pain in left knee: Secondary | ICD-10-CM | POA: Diagnosis not present

## 2021-03-25 DIAGNOSIS — Z4800 Encounter for change or removal of nonsurgical wound dressing: Secondary | ICD-10-CM | POA: Diagnosis not present

## 2021-03-25 DIAGNOSIS — L97322 Non-pressure chronic ulcer of left ankle with fat layer exposed: Secondary | ICD-10-CM | POA: Diagnosis not present

## 2021-03-28 DIAGNOSIS — I890 Lymphedema, not elsewhere classified: Secondary | ICD-10-CM | POA: Diagnosis not present

## 2021-03-28 DIAGNOSIS — I10 Essential (primary) hypertension: Secondary | ICD-10-CM | POA: Diagnosis not present

## 2021-03-28 DIAGNOSIS — G8929 Other chronic pain: Secondary | ICD-10-CM | POA: Diagnosis not present

## 2021-03-28 DIAGNOSIS — L97929 Non-pressure chronic ulcer of unspecified part of left lower leg with unspecified severity: Secondary | ICD-10-CM | POA: Diagnosis not present

## 2021-03-28 DIAGNOSIS — I96 Gangrene, not elsewhere classified: Secondary | ICD-10-CM | POA: Diagnosis not present

## 2021-03-28 DIAGNOSIS — L97919 Non-pressure chronic ulcer of unspecified part of right lower leg with unspecified severity: Secondary | ICD-10-CM | POA: Diagnosis not present

## 2021-03-28 DIAGNOSIS — L97311 Non-pressure chronic ulcer of right ankle limited to breakdown of skin: Secondary | ICD-10-CM | POA: Diagnosis not present

## 2021-03-28 DIAGNOSIS — L84 Corns and callosities: Secondary | ICD-10-CM | POA: Diagnosis not present

## 2021-03-28 DIAGNOSIS — M25569 Pain in unspecified knee: Secondary | ICD-10-CM | POA: Diagnosis not present

## 2021-03-28 DIAGNOSIS — R209 Unspecified disturbances of skin sensation: Secondary | ICD-10-CM | POA: Diagnosis not present

## 2021-03-28 DIAGNOSIS — L97521 Non-pressure chronic ulcer of other part of left foot limited to breakdown of skin: Secondary | ICD-10-CM | POA: Diagnosis not present

## 2021-03-28 DIAGNOSIS — I83029 Varicose veins of left lower extremity with ulcer of unspecified site: Secondary | ICD-10-CM | POA: Diagnosis not present

## 2021-03-28 DIAGNOSIS — I83019 Varicose veins of right lower extremity with ulcer of unspecified site: Secondary | ICD-10-CM | POA: Diagnosis not present

## 2021-03-28 DIAGNOSIS — M810 Age-related osteoporosis without current pathological fracture: Secondary | ICD-10-CM | POA: Diagnosis not present

## 2021-03-28 DIAGNOSIS — L570 Actinic keratosis: Secondary | ICD-10-CM | POA: Diagnosis not present

## 2021-03-28 DIAGNOSIS — A498 Other bacterial infections of unspecified site: Secondary | ICD-10-CM | POA: Diagnosis not present

## 2021-03-28 DIAGNOSIS — R600 Localized edema: Secondary | ICD-10-CM | POA: Diagnosis not present

## 2021-03-28 DIAGNOSIS — M25561 Pain in right knee: Secondary | ICD-10-CM | POA: Diagnosis not present

## 2021-03-28 DIAGNOSIS — M25562 Pain in left knee: Secondary | ICD-10-CM | POA: Diagnosis not present

## 2021-03-28 DIAGNOSIS — A4902 Methicillin resistant Staphylococcus aureus infection, unspecified site: Secondary | ICD-10-CM | POA: Diagnosis not present

## 2021-03-28 DIAGNOSIS — R2681 Unsteadiness on feet: Secondary | ICD-10-CM | POA: Diagnosis not present

## 2021-03-29 DIAGNOSIS — I872 Venous insufficiency (chronic) (peripheral): Secondary | ICD-10-CM | POA: Diagnosis not present

## 2021-03-29 DIAGNOSIS — L97322 Non-pressure chronic ulcer of left ankle with fat layer exposed: Secondary | ICD-10-CM | POA: Diagnosis not present

## 2021-03-29 DIAGNOSIS — L97812 Non-pressure chronic ulcer of other part of right lower leg with fat layer exposed: Secondary | ICD-10-CM | POA: Diagnosis not present

## 2021-03-29 DIAGNOSIS — L97522 Non-pressure chronic ulcer of other part of left foot with fat layer exposed: Secondary | ICD-10-CM | POA: Diagnosis not present

## 2021-03-29 DIAGNOSIS — Z20828 Contact with and (suspected) exposure to other viral communicable diseases: Secondary | ICD-10-CM | POA: Diagnosis not present

## 2021-03-29 DIAGNOSIS — I890 Lymphedema, not elsewhere classified: Secondary | ICD-10-CM | POA: Diagnosis not present

## 2021-03-29 DIAGNOSIS — Z4800 Encounter for change or removal of nonsurgical wound dressing: Secondary | ICD-10-CM | POA: Diagnosis not present

## 2021-04-01 DIAGNOSIS — L97322 Non-pressure chronic ulcer of left ankle with fat layer exposed: Secondary | ICD-10-CM | POA: Diagnosis not present

## 2021-04-01 DIAGNOSIS — Z4800 Encounter for change or removal of nonsurgical wound dressing: Secondary | ICD-10-CM | POA: Diagnosis not present

## 2021-04-01 DIAGNOSIS — L97812 Non-pressure chronic ulcer of other part of right lower leg with fat layer exposed: Secondary | ICD-10-CM | POA: Diagnosis not present

## 2021-04-01 DIAGNOSIS — I872 Venous insufficiency (chronic) (peripheral): Secondary | ICD-10-CM | POA: Diagnosis not present

## 2021-04-01 DIAGNOSIS — L97522 Non-pressure chronic ulcer of other part of left foot with fat layer exposed: Secondary | ICD-10-CM | POA: Diagnosis not present

## 2021-04-01 DIAGNOSIS — I890 Lymphedema, not elsewhere classified: Secondary | ICD-10-CM | POA: Diagnosis not present

## 2021-04-05 DIAGNOSIS — L97522 Non-pressure chronic ulcer of other part of left foot with fat layer exposed: Secondary | ICD-10-CM | POA: Diagnosis not present

## 2021-04-05 DIAGNOSIS — I872 Venous insufficiency (chronic) (peripheral): Secondary | ICD-10-CM | POA: Diagnosis not present

## 2021-04-05 DIAGNOSIS — L97812 Non-pressure chronic ulcer of other part of right lower leg with fat layer exposed: Secondary | ICD-10-CM | POA: Diagnosis not present

## 2021-04-05 DIAGNOSIS — L97322 Non-pressure chronic ulcer of left ankle with fat layer exposed: Secondary | ICD-10-CM | POA: Diagnosis not present

## 2021-04-05 DIAGNOSIS — I890 Lymphedema, not elsewhere classified: Secondary | ICD-10-CM | POA: Diagnosis not present

## 2021-04-05 DIAGNOSIS — Z4800 Encounter for change or removal of nonsurgical wound dressing: Secondary | ICD-10-CM | POA: Diagnosis not present

## 2021-04-08 DIAGNOSIS — Z4800 Encounter for change or removal of nonsurgical wound dressing: Secondary | ICD-10-CM | POA: Diagnosis not present

## 2021-04-08 DIAGNOSIS — L97522 Non-pressure chronic ulcer of other part of left foot with fat layer exposed: Secondary | ICD-10-CM | POA: Diagnosis not present

## 2021-04-08 DIAGNOSIS — I872 Venous insufficiency (chronic) (peripheral): Secondary | ICD-10-CM | POA: Diagnosis not present

## 2021-04-08 DIAGNOSIS — I890 Lymphedema, not elsewhere classified: Secondary | ICD-10-CM | POA: Diagnosis not present

## 2021-04-08 DIAGNOSIS — L97322 Non-pressure chronic ulcer of left ankle with fat layer exposed: Secondary | ICD-10-CM | POA: Diagnosis not present

## 2021-04-08 DIAGNOSIS — L97812 Non-pressure chronic ulcer of other part of right lower leg with fat layer exposed: Secondary | ICD-10-CM | POA: Diagnosis not present

## 2021-04-12 DIAGNOSIS — I872 Venous insufficiency (chronic) (peripheral): Secondary | ICD-10-CM | POA: Diagnosis not present

## 2021-04-12 DIAGNOSIS — I890 Lymphedema, not elsewhere classified: Secondary | ICD-10-CM | POA: Diagnosis not present

## 2021-04-12 DIAGNOSIS — L97522 Non-pressure chronic ulcer of other part of left foot with fat layer exposed: Secondary | ICD-10-CM | POA: Diagnosis not present

## 2021-04-12 DIAGNOSIS — L97322 Non-pressure chronic ulcer of left ankle with fat layer exposed: Secondary | ICD-10-CM | POA: Diagnosis not present

## 2021-04-12 DIAGNOSIS — Z4800 Encounter for change or removal of nonsurgical wound dressing: Secondary | ICD-10-CM | POA: Diagnosis not present

## 2021-04-12 DIAGNOSIS — L97812 Non-pressure chronic ulcer of other part of right lower leg with fat layer exposed: Secondary | ICD-10-CM | POA: Diagnosis not present

## 2021-04-15 DIAGNOSIS — I872 Venous insufficiency (chronic) (peripheral): Secondary | ICD-10-CM | POA: Diagnosis not present

## 2021-04-15 DIAGNOSIS — L97812 Non-pressure chronic ulcer of other part of right lower leg with fat layer exposed: Secondary | ICD-10-CM | POA: Diagnosis not present

## 2021-04-15 DIAGNOSIS — L97322 Non-pressure chronic ulcer of left ankle with fat layer exposed: Secondary | ICD-10-CM | POA: Diagnosis not present

## 2021-04-15 DIAGNOSIS — I890 Lymphedema, not elsewhere classified: Secondary | ICD-10-CM | POA: Diagnosis not present

## 2021-04-15 DIAGNOSIS — Z4800 Encounter for change or removal of nonsurgical wound dressing: Secondary | ICD-10-CM | POA: Diagnosis not present

## 2021-04-15 DIAGNOSIS — L97522 Non-pressure chronic ulcer of other part of left foot with fat layer exposed: Secondary | ICD-10-CM | POA: Diagnosis not present

## 2021-04-19 DIAGNOSIS — I890 Lymphedema, not elsewhere classified: Secondary | ICD-10-CM | POA: Diagnosis not present

## 2021-04-19 DIAGNOSIS — Z4800 Encounter for change or removal of nonsurgical wound dressing: Secondary | ICD-10-CM | POA: Diagnosis not present

## 2021-04-19 DIAGNOSIS — L97522 Non-pressure chronic ulcer of other part of left foot with fat layer exposed: Secondary | ICD-10-CM | POA: Diagnosis not present

## 2021-04-19 DIAGNOSIS — L97812 Non-pressure chronic ulcer of other part of right lower leg with fat layer exposed: Secondary | ICD-10-CM | POA: Diagnosis not present

## 2021-04-19 DIAGNOSIS — I872 Venous insufficiency (chronic) (peripheral): Secondary | ICD-10-CM | POA: Diagnosis not present

## 2021-04-19 DIAGNOSIS — L97322 Non-pressure chronic ulcer of left ankle with fat layer exposed: Secondary | ICD-10-CM | POA: Diagnosis not present

## 2021-04-20 DIAGNOSIS — Z79891 Long term (current) use of opiate analgesic: Secondary | ICD-10-CM | POA: Diagnosis not present

## 2021-04-20 DIAGNOSIS — Z791 Long term (current) use of non-steroidal anti-inflammatories (NSAID): Secondary | ICD-10-CM | POA: Diagnosis not present

## 2021-04-20 DIAGNOSIS — I890 Lymphedema, not elsewhere classified: Secondary | ICD-10-CM | POA: Diagnosis not present

## 2021-04-20 DIAGNOSIS — K219 Gastro-esophageal reflux disease without esophagitis: Secondary | ICD-10-CM | POA: Diagnosis not present

## 2021-04-20 DIAGNOSIS — L97322 Non-pressure chronic ulcer of left ankle with fat layer exposed: Secondary | ICD-10-CM | POA: Diagnosis not present

## 2021-04-20 DIAGNOSIS — G894 Chronic pain syndrome: Secondary | ICD-10-CM | POA: Diagnosis not present

## 2021-04-20 DIAGNOSIS — L97812 Non-pressure chronic ulcer of other part of right lower leg with fat layer exposed: Secondary | ICD-10-CM | POA: Diagnosis not present

## 2021-04-20 DIAGNOSIS — L97522 Non-pressure chronic ulcer of other part of left foot with fat layer exposed: Secondary | ICD-10-CM | POA: Diagnosis not present

## 2021-04-20 DIAGNOSIS — Z9181 History of falling: Secondary | ICD-10-CM | POA: Diagnosis not present

## 2021-04-20 DIAGNOSIS — Z87891 Personal history of nicotine dependence: Secondary | ICD-10-CM | POA: Diagnosis not present

## 2021-04-20 DIAGNOSIS — Z4800 Encounter for change or removal of nonsurgical wound dressing: Secondary | ICD-10-CM | POA: Diagnosis not present

## 2021-04-20 DIAGNOSIS — R2689 Other abnormalities of gait and mobility: Secondary | ICD-10-CM | POA: Diagnosis not present

## 2021-04-20 DIAGNOSIS — I7389 Other specified peripheral vascular diseases: Secondary | ICD-10-CM | POA: Diagnosis not present

## 2021-04-20 DIAGNOSIS — M6281 Muscle weakness (generalized): Secondary | ICD-10-CM | POA: Diagnosis not present

## 2021-04-20 DIAGNOSIS — Z8614 Personal history of Methicillin resistant Staphylococcus aureus infection: Secondary | ICD-10-CM | POA: Diagnosis not present

## 2021-04-20 DIAGNOSIS — I10 Essential (primary) hypertension: Secondary | ICD-10-CM | POA: Diagnosis not present

## 2021-04-20 DIAGNOSIS — M170 Bilateral primary osteoarthritis of knee: Secondary | ICD-10-CM | POA: Diagnosis not present

## 2021-04-20 DIAGNOSIS — M810 Age-related osteoporosis without current pathological fracture: Secondary | ICD-10-CM | POA: Diagnosis not present

## 2021-04-20 DIAGNOSIS — E785 Hyperlipidemia, unspecified: Secondary | ICD-10-CM | POA: Diagnosis not present

## 2021-04-20 DIAGNOSIS — I872 Venous insufficiency (chronic) (peripheral): Secondary | ICD-10-CM | POA: Diagnosis not present

## 2021-04-22 DIAGNOSIS — Z4800 Encounter for change or removal of nonsurgical wound dressing: Secondary | ICD-10-CM | POA: Diagnosis not present

## 2021-04-22 DIAGNOSIS — I890 Lymphedema, not elsewhere classified: Secondary | ICD-10-CM | POA: Diagnosis not present

## 2021-04-22 DIAGNOSIS — L97812 Non-pressure chronic ulcer of other part of right lower leg with fat layer exposed: Secondary | ICD-10-CM | POA: Diagnosis not present

## 2021-04-22 DIAGNOSIS — L97522 Non-pressure chronic ulcer of other part of left foot with fat layer exposed: Secondary | ICD-10-CM | POA: Diagnosis not present

## 2021-04-22 DIAGNOSIS — L97322 Non-pressure chronic ulcer of left ankle with fat layer exposed: Secondary | ICD-10-CM | POA: Diagnosis not present

## 2021-04-22 DIAGNOSIS — I872 Venous insufficiency (chronic) (peripheral): Secondary | ICD-10-CM | POA: Diagnosis not present

## 2021-04-26 DIAGNOSIS — L97812 Non-pressure chronic ulcer of other part of right lower leg with fat layer exposed: Secondary | ICD-10-CM | POA: Diagnosis not present

## 2021-04-26 DIAGNOSIS — Z4800 Encounter for change or removal of nonsurgical wound dressing: Secondary | ICD-10-CM | POA: Diagnosis not present

## 2021-04-26 DIAGNOSIS — L97522 Non-pressure chronic ulcer of other part of left foot with fat layer exposed: Secondary | ICD-10-CM | POA: Diagnosis not present

## 2021-04-26 DIAGNOSIS — L97322 Non-pressure chronic ulcer of left ankle with fat layer exposed: Secondary | ICD-10-CM | POA: Diagnosis not present

## 2021-04-26 DIAGNOSIS — I890 Lymphedema, not elsewhere classified: Secondary | ICD-10-CM | POA: Diagnosis not present

## 2021-04-26 DIAGNOSIS — I872 Venous insufficiency (chronic) (peripheral): Secondary | ICD-10-CM | POA: Diagnosis not present

## 2021-04-27 DIAGNOSIS — I890 Lymphedema, not elsewhere classified: Secondary | ICD-10-CM | POA: Diagnosis not present

## 2021-04-27 DIAGNOSIS — L97818 Non-pressure chronic ulcer of other part of right lower leg with other specified severity: Secondary | ICD-10-CM | POA: Diagnosis not present

## 2021-04-27 DIAGNOSIS — L97328 Non-pressure chronic ulcer of left ankle with other specified severity: Secondary | ICD-10-CM | POA: Diagnosis not present

## 2021-04-27 DIAGNOSIS — L97521 Non-pressure chronic ulcer of other part of left foot limited to breakdown of skin: Secondary | ICD-10-CM | POA: Diagnosis not present

## 2021-04-27 DIAGNOSIS — L97311 Non-pressure chronic ulcer of right ankle limited to breakdown of skin: Secondary | ICD-10-CM | POA: Diagnosis not present

## 2021-04-27 DIAGNOSIS — I10 Essential (primary) hypertension: Secondary | ICD-10-CM | POA: Diagnosis not present

## 2021-04-27 DIAGNOSIS — G8929 Other chronic pain: Secondary | ICD-10-CM | POA: Diagnosis not present

## 2021-04-27 DIAGNOSIS — I872 Venous insufficiency (chronic) (peripheral): Secondary | ICD-10-CM | POA: Diagnosis not present

## 2021-04-27 DIAGNOSIS — M810 Age-related osteoporosis without current pathological fracture: Secondary | ICD-10-CM | POA: Diagnosis not present

## 2021-04-27 DIAGNOSIS — M1990 Unspecified osteoarthritis, unspecified site: Secondary | ICD-10-CM | POA: Diagnosis not present

## 2021-04-29 DIAGNOSIS — I890 Lymphedema, not elsewhere classified: Secondary | ICD-10-CM | POA: Diagnosis not present

## 2021-04-29 DIAGNOSIS — Z4800 Encounter for change or removal of nonsurgical wound dressing: Secondary | ICD-10-CM | POA: Diagnosis not present

## 2021-04-29 DIAGNOSIS — L97522 Non-pressure chronic ulcer of other part of left foot with fat layer exposed: Secondary | ICD-10-CM | POA: Diagnosis not present

## 2021-04-29 DIAGNOSIS — L97322 Non-pressure chronic ulcer of left ankle with fat layer exposed: Secondary | ICD-10-CM | POA: Diagnosis not present

## 2021-04-29 DIAGNOSIS — L97812 Non-pressure chronic ulcer of other part of right lower leg with fat layer exposed: Secondary | ICD-10-CM | POA: Diagnosis not present

## 2021-04-29 DIAGNOSIS — I872 Venous insufficiency (chronic) (peripheral): Secondary | ICD-10-CM | POA: Diagnosis not present

## 2021-05-03 DIAGNOSIS — I872 Venous insufficiency (chronic) (peripheral): Secondary | ICD-10-CM | POA: Diagnosis not present

## 2021-05-03 DIAGNOSIS — I890 Lymphedema, not elsewhere classified: Secondary | ICD-10-CM | POA: Diagnosis not present

## 2021-05-03 DIAGNOSIS — L97522 Non-pressure chronic ulcer of other part of left foot with fat layer exposed: Secondary | ICD-10-CM | POA: Diagnosis not present

## 2021-05-03 DIAGNOSIS — Z4800 Encounter for change or removal of nonsurgical wound dressing: Secondary | ICD-10-CM | POA: Diagnosis not present

## 2021-05-03 DIAGNOSIS — L97322 Non-pressure chronic ulcer of left ankle with fat layer exposed: Secondary | ICD-10-CM | POA: Diagnosis not present

## 2021-05-03 DIAGNOSIS — L97812 Non-pressure chronic ulcer of other part of right lower leg with fat layer exposed: Secondary | ICD-10-CM | POA: Diagnosis not present

## 2021-05-06 DIAGNOSIS — L97522 Non-pressure chronic ulcer of other part of left foot with fat layer exposed: Secondary | ICD-10-CM | POA: Diagnosis not present

## 2021-05-06 DIAGNOSIS — Z4800 Encounter for change or removal of nonsurgical wound dressing: Secondary | ICD-10-CM | POA: Diagnosis not present

## 2021-05-06 DIAGNOSIS — L97812 Non-pressure chronic ulcer of other part of right lower leg with fat layer exposed: Secondary | ICD-10-CM | POA: Diagnosis not present

## 2021-05-06 DIAGNOSIS — I872 Venous insufficiency (chronic) (peripheral): Secondary | ICD-10-CM | POA: Diagnosis not present

## 2021-05-06 DIAGNOSIS — I890 Lymphedema, not elsewhere classified: Secondary | ICD-10-CM | POA: Diagnosis not present

## 2021-05-06 DIAGNOSIS — L97322 Non-pressure chronic ulcer of left ankle with fat layer exposed: Secondary | ICD-10-CM | POA: Diagnosis not present

## 2021-05-10 DIAGNOSIS — L97322 Non-pressure chronic ulcer of left ankle with fat layer exposed: Secondary | ICD-10-CM | POA: Diagnosis not present

## 2021-05-10 DIAGNOSIS — L97812 Non-pressure chronic ulcer of other part of right lower leg with fat layer exposed: Secondary | ICD-10-CM | POA: Diagnosis not present

## 2021-05-10 DIAGNOSIS — Z4800 Encounter for change or removal of nonsurgical wound dressing: Secondary | ICD-10-CM | POA: Diagnosis not present

## 2021-05-10 DIAGNOSIS — L97522 Non-pressure chronic ulcer of other part of left foot with fat layer exposed: Secondary | ICD-10-CM | POA: Diagnosis not present

## 2021-05-10 DIAGNOSIS — I890 Lymphedema, not elsewhere classified: Secondary | ICD-10-CM | POA: Diagnosis not present

## 2021-05-10 DIAGNOSIS — I872 Venous insufficiency (chronic) (peripheral): Secondary | ICD-10-CM | POA: Diagnosis not present

## 2021-05-12 DIAGNOSIS — Z1231 Encounter for screening mammogram for malignant neoplasm of breast: Secondary | ICD-10-CM | POA: Diagnosis not present

## 2021-05-13 DIAGNOSIS — L97522 Non-pressure chronic ulcer of other part of left foot with fat layer exposed: Secondary | ICD-10-CM | POA: Diagnosis not present

## 2021-05-13 DIAGNOSIS — I872 Venous insufficiency (chronic) (peripheral): Secondary | ICD-10-CM | POA: Diagnosis not present

## 2021-05-13 DIAGNOSIS — L97812 Non-pressure chronic ulcer of other part of right lower leg with fat layer exposed: Secondary | ICD-10-CM | POA: Diagnosis not present

## 2021-05-13 DIAGNOSIS — L97322 Non-pressure chronic ulcer of left ankle with fat layer exposed: Secondary | ICD-10-CM | POA: Diagnosis not present

## 2021-05-13 DIAGNOSIS — I890 Lymphedema, not elsewhere classified: Secondary | ICD-10-CM | POA: Diagnosis not present

## 2021-05-13 DIAGNOSIS — Z4800 Encounter for change or removal of nonsurgical wound dressing: Secondary | ICD-10-CM | POA: Diagnosis not present

## 2021-05-17 DIAGNOSIS — Z4800 Encounter for change or removal of nonsurgical wound dressing: Secondary | ICD-10-CM | POA: Diagnosis not present

## 2021-05-17 DIAGNOSIS — L97322 Non-pressure chronic ulcer of left ankle with fat layer exposed: Secondary | ICD-10-CM | POA: Diagnosis not present

## 2021-05-17 DIAGNOSIS — L97812 Non-pressure chronic ulcer of other part of right lower leg with fat layer exposed: Secondary | ICD-10-CM | POA: Diagnosis not present

## 2021-05-17 DIAGNOSIS — L97522 Non-pressure chronic ulcer of other part of left foot with fat layer exposed: Secondary | ICD-10-CM | POA: Diagnosis not present

## 2021-05-17 DIAGNOSIS — I890 Lymphedema, not elsewhere classified: Secondary | ICD-10-CM | POA: Diagnosis not present

## 2021-05-17 DIAGNOSIS — I872 Venous insufficiency (chronic) (peripheral): Secondary | ICD-10-CM | POA: Diagnosis not present

## 2021-05-20 DIAGNOSIS — M810 Age-related osteoporosis without current pathological fracture: Secondary | ICD-10-CM | POA: Diagnosis not present

## 2021-05-20 DIAGNOSIS — L97322 Non-pressure chronic ulcer of left ankle with fat layer exposed: Secondary | ICD-10-CM | POA: Diagnosis not present

## 2021-05-20 DIAGNOSIS — I872 Venous insufficiency (chronic) (peripheral): Secondary | ICD-10-CM | POA: Diagnosis not present

## 2021-05-20 DIAGNOSIS — Z79891 Long term (current) use of opiate analgesic: Secondary | ICD-10-CM | POA: Diagnosis not present

## 2021-05-20 DIAGNOSIS — Z9181 History of falling: Secondary | ICD-10-CM | POA: Diagnosis not present

## 2021-05-20 DIAGNOSIS — G894 Chronic pain syndrome: Secondary | ICD-10-CM | POA: Diagnosis not present

## 2021-05-20 DIAGNOSIS — R2689 Other abnormalities of gait and mobility: Secondary | ICD-10-CM | POA: Diagnosis not present

## 2021-05-20 DIAGNOSIS — Z791 Long term (current) use of non-steroidal anti-inflammatories (NSAID): Secondary | ICD-10-CM | POA: Diagnosis not present

## 2021-05-20 DIAGNOSIS — E785 Hyperlipidemia, unspecified: Secondary | ICD-10-CM | POA: Diagnosis not present

## 2021-05-20 DIAGNOSIS — L97812 Non-pressure chronic ulcer of other part of right lower leg with fat layer exposed: Secondary | ICD-10-CM | POA: Diagnosis not present

## 2021-05-20 DIAGNOSIS — Z87891 Personal history of nicotine dependence: Secondary | ICD-10-CM | POA: Diagnosis not present

## 2021-05-20 DIAGNOSIS — Z4800 Encounter for change or removal of nonsurgical wound dressing: Secondary | ICD-10-CM | POA: Diagnosis not present

## 2021-05-20 DIAGNOSIS — M6281 Muscle weakness (generalized): Secondary | ICD-10-CM | POA: Diagnosis not present

## 2021-05-20 DIAGNOSIS — I890 Lymphedema, not elsewhere classified: Secondary | ICD-10-CM | POA: Diagnosis not present

## 2021-05-20 DIAGNOSIS — K219 Gastro-esophageal reflux disease without esophagitis: Secondary | ICD-10-CM | POA: Diagnosis not present

## 2021-05-20 DIAGNOSIS — Z8614 Personal history of Methicillin resistant Staphylococcus aureus infection: Secondary | ICD-10-CM | POA: Diagnosis not present

## 2021-05-20 DIAGNOSIS — I10 Essential (primary) hypertension: Secondary | ICD-10-CM | POA: Diagnosis not present

## 2021-05-20 DIAGNOSIS — L97522 Non-pressure chronic ulcer of other part of left foot with fat layer exposed: Secondary | ICD-10-CM | POA: Diagnosis not present

## 2021-05-20 DIAGNOSIS — I7389 Other specified peripheral vascular diseases: Secondary | ICD-10-CM | POA: Diagnosis not present

## 2021-05-20 DIAGNOSIS — M170 Bilateral primary osteoarthritis of knee: Secondary | ICD-10-CM | POA: Diagnosis not present

## 2021-05-24 DIAGNOSIS — I890 Lymphedema, not elsewhere classified: Secondary | ICD-10-CM | POA: Diagnosis not present

## 2021-05-24 DIAGNOSIS — Z4800 Encounter for change or removal of nonsurgical wound dressing: Secondary | ICD-10-CM | POA: Diagnosis not present

## 2021-05-24 DIAGNOSIS — L97812 Non-pressure chronic ulcer of other part of right lower leg with fat layer exposed: Secondary | ICD-10-CM | POA: Diagnosis not present

## 2021-05-24 DIAGNOSIS — I872 Venous insufficiency (chronic) (peripheral): Secondary | ICD-10-CM | POA: Diagnosis not present

## 2021-05-24 DIAGNOSIS — L97522 Non-pressure chronic ulcer of other part of left foot with fat layer exposed: Secondary | ICD-10-CM | POA: Diagnosis not present

## 2021-05-24 DIAGNOSIS — L97322 Non-pressure chronic ulcer of left ankle with fat layer exposed: Secondary | ICD-10-CM | POA: Diagnosis not present

## 2021-05-27 DIAGNOSIS — I872 Venous insufficiency (chronic) (peripheral): Secondary | ICD-10-CM | POA: Diagnosis not present

## 2021-05-27 DIAGNOSIS — L97812 Non-pressure chronic ulcer of other part of right lower leg with fat layer exposed: Secondary | ICD-10-CM | POA: Diagnosis not present

## 2021-05-27 DIAGNOSIS — L97322 Non-pressure chronic ulcer of left ankle with fat layer exposed: Secondary | ICD-10-CM | POA: Diagnosis not present

## 2021-05-27 DIAGNOSIS — L97522 Non-pressure chronic ulcer of other part of left foot with fat layer exposed: Secondary | ICD-10-CM | POA: Diagnosis not present

## 2021-05-27 DIAGNOSIS — I890 Lymphedema, not elsewhere classified: Secondary | ICD-10-CM | POA: Diagnosis not present

## 2021-05-27 DIAGNOSIS — Z4800 Encounter for change or removal of nonsurgical wound dressing: Secondary | ICD-10-CM | POA: Diagnosis not present

## 2021-05-31 DIAGNOSIS — I739 Peripheral vascular disease, unspecified: Secondary | ICD-10-CM | POA: Diagnosis not present

## 2021-05-31 DIAGNOSIS — L84 Corns and callosities: Secondary | ICD-10-CM | POA: Diagnosis not present

## 2021-05-31 DIAGNOSIS — M79674 Pain in right toe(s): Secondary | ICD-10-CM | POA: Diagnosis not present

## 2021-05-31 DIAGNOSIS — B351 Tinea unguium: Secondary | ICD-10-CM | POA: Diagnosis not present

## 2021-05-31 DIAGNOSIS — L602 Onychogryphosis: Secondary | ICD-10-CM | POA: Diagnosis not present

## 2021-05-31 DIAGNOSIS — L600 Ingrowing nail: Secondary | ICD-10-CM | POA: Diagnosis not present

## 2021-05-31 DIAGNOSIS — M79675 Pain in left toe(s): Secondary | ICD-10-CM | POA: Diagnosis not present

## 2021-06-03 DIAGNOSIS — L97812 Non-pressure chronic ulcer of other part of right lower leg with fat layer exposed: Secondary | ICD-10-CM | POA: Diagnosis not present

## 2021-06-03 DIAGNOSIS — L97522 Non-pressure chronic ulcer of other part of left foot with fat layer exposed: Secondary | ICD-10-CM | POA: Diagnosis not present

## 2021-06-03 DIAGNOSIS — Z4800 Encounter for change or removal of nonsurgical wound dressing: Secondary | ICD-10-CM | POA: Diagnosis not present

## 2021-06-03 DIAGNOSIS — I890 Lymphedema, not elsewhere classified: Secondary | ICD-10-CM | POA: Diagnosis not present

## 2021-06-03 DIAGNOSIS — L97322 Non-pressure chronic ulcer of left ankle with fat layer exposed: Secondary | ICD-10-CM | POA: Diagnosis not present

## 2021-06-03 DIAGNOSIS — I872 Venous insufficiency (chronic) (peripheral): Secondary | ICD-10-CM | POA: Diagnosis not present

## 2021-06-07 DIAGNOSIS — L97812 Non-pressure chronic ulcer of other part of right lower leg with fat layer exposed: Secondary | ICD-10-CM | POA: Diagnosis not present

## 2021-06-07 DIAGNOSIS — L97322 Non-pressure chronic ulcer of left ankle with fat layer exposed: Secondary | ICD-10-CM | POA: Diagnosis not present

## 2021-06-07 DIAGNOSIS — I872 Venous insufficiency (chronic) (peripheral): Secondary | ICD-10-CM | POA: Diagnosis not present

## 2021-06-07 DIAGNOSIS — L97522 Non-pressure chronic ulcer of other part of left foot with fat layer exposed: Secondary | ICD-10-CM | POA: Diagnosis not present

## 2021-06-07 DIAGNOSIS — Z4800 Encounter for change or removal of nonsurgical wound dressing: Secondary | ICD-10-CM | POA: Diagnosis not present

## 2021-06-07 DIAGNOSIS — I890 Lymphedema, not elsewhere classified: Secondary | ICD-10-CM | POA: Diagnosis not present

## 2021-06-10 DIAGNOSIS — L97812 Non-pressure chronic ulcer of other part of right lower leg with fat layer exposed: Secondary | ICD-10-CM | POA: Diagnosis not present

## 2021-06-10 DIAGNOSIS — I872 Venous insufficiency (chronic) (peripheral): Secondary | ICD-10-CM | POA: Diagnosis not present

## 2021-06-10 DIAGNOSIS — L97522 Non-pressure chronic ulcer of other part of left foot with fat layer exposed: Secondary | ICD-10-CM | POA: Diagnosis not present

## 2021-06-10 DIAGNOSIS — L97322 Non-pressure chronic ulcer of left ankle with fat layer exposed: Secondary | ICD-10-CM | POA: Diagnosis not present

## 2021-06-10 DIAGNOSIS — Z4800 Encounter for change or removal of nonsurgical wound dressing: Secondary | ICD-10-CM | POA: Diagnosis not present

## 2021-06-10 DIAGNOSIS — I890 Lymphedema, not elsewhere classified: Secondary | ICD-10-CM | POA: Diagnosis not present

## 2021-06-14 DIAGNOSIS — L97812 Non-pressure chronic ulcer of other part of right lower leg with fat layer exposed: Secondary | ICD-10-CM | POA: Diagnosis not present

## 2021-06-14 DIAGNOSIS — I890 Lymphedema, not elsewhere classified: Secondary | ICD-10-CM | POA: Diagnosis not present

## 2021-06-14 DIAGNOSIS — L97522 Non-pressure chronic ulcer of other part of left foot with fat layer exposed: Secondary | ICD-10-CM | POA: Diagnosis not present

## 2021-06-14 DIAGNOSIS — Z4800 Encounter for change or removal of nonsurgical wound dressing: Secondary | ICD-10-CM | POA: Diagnosis not present

## 2021-06-14 DIAGNOSIS — I872 Venous insufficiency (chronic) (peripheral): Secondary | ICD-10-CM | POA: Diagnosis not present

## 2021-06-14 DIAGNOSIS — L97322 Non-pressure chronic ulcer of left ankle with fat layer exposed: Secondary | ICD-10-CM | POA: Diagnosis not present

## 2021-06-15 DIAGNOSIS — G8929 Other chronic pain: Secondary | ICD-10-CM | POA: Diagnosis not present

## 2021-06-15 DIAGNOSIS — L97929 Non-pressure chronic ulcer of unspecified part of left lower leg with unspecified severity: Secondary | ICD-10-CM | POA: Diagnosis not present

## 2021-06-15 DIAGNOSIS — I83011 Varicose veins of right lower extremity with ulcer of thigh: Secondary | ICD-10-CM | POA: Diagnosis not present

## 2021-06-15 DIAGNOSIS — I10 Essential (primary) hypertension: Secondary | ICD-10-CM | POA: Diagnosis not present

## 2021-06-15 DIAGNOSIS — I83019 Varicose veins of right lower extremity with ulcer of unspecified site: Secondary | ICD-10-CM | POA: Diagnosis not present

## 2021-06-15 DIAGNOSIS — I83029 Varicose veins of left lower extremity with ulcer of unspecified site: Secondary | ICD-10-CM | POA: Diagnosis not present

## 2021-06-15 DIAGNOSIS — I872 Venous insufficiency (chronic) (peripheral): Secondary | ICD-10-CM | POA: Diagnosis not present

## 2021-06-15 DIAGNOSIS — L97919 Non-pressure chronic ulcer of unspecified part of right lower leg with unspecified severity: Secondary | ICD-10-CM | POA: Diagnosis not present

## 2021-06-17 DIAGNOSIS — L97322 Non-pressure chronic ulcer of left ankle with fat layer exposed: Secondary | ICD-10-CM | POA: Diagnosis not present

## 2021-06-17 DIAGNOSIS — L97812 Non-pressure chronic ulcer of other part of right lower leg with fat layer exposed: Secondary | ICD-10-CM | POA: Diagnosis not present

## 2021-06-17 DIAGNOSIS — I890 Lymphedema, not elsewhere classified: Secondary | ICD-10-CM | POA: Diagnosis not present

## 2021-06-17 DIAGNOSIS — I872 Venous insufficiency (chronic) (peripheral): Secondary | ICD-10-CM | POA: Diagnosis not present

## 2021-06-17 DIAGNOSIS — Z4800 Encounter for change or removal of nonsurgical wound dressing: Secondary | ICD-10-CM | POA: Diagnosis not present

## 2021-06-17 DIAGNOSIS — L97522 Non-pressure chronic ulcer of other part of left foot with fat layer exposed: Secondary | ICD-10-CM | POA: Diagnosis not present

## 2021-06-19 DIAGNOSIS — M170 Bilateral primary osteoarthritis of knee: Secondary | ICD-10-CM | POA: Diagnosis not present

## 2021-06-19 DIAGNOSIS — E785 Hyperlipidemia, unspecified: Secondary | ICD-10-CM | POA: Diagnosis not present

## 2021-06-19 DIAGNOSIS — I890 Lymphedema, not elsewhere classified: Secondary | ICD-10-CM | POA: Diagnosis not present

## 2021-06-19 DIAGNOSIS — R2689 Other abnormalities of gait and mobility: Secondary | ICD-10-CM | POA: Diagnosis not present

## 2021-06-19 DIAGNOSIS — Z4800 Encounter for change or removal of nonsurgical wound dressing: Secondary | ICD-10-CM | POA: Diagnosis not present

## 2021-06-19 DIAGNOSIS — L97322 Non-pressure chronic ulcer of left ankle with fat layer exposed: Secondary | ICD-10-CM | POA: Diagnosis not present

## 2021-06-19 DIAGNOSIS — Z791 Long term (current) use of non-steroidal anti-inflammatories (NSAID): Secondary | ICD-10-CM | POA: Diagnosis not present

## 2021-06-19 DIAGNOSIS — M6281 Muscle weakness (generalized): Secondary | ICD-10-CM | POA: Diagnosis not present

## 2021-06-19 DIAGNOSIS — M810 Age-related osteoporosis without current pathological fracture: Secondary | ICD-10-CM | POA: Diagnosis not present

## 2021-06-19 DIAGNOSIS — Z9181 History of falling: Secondary | ICD-10-CM | POA: Diagnosis not present

## 2021-06-19 DIAGNOSIS — L97812 Non-pressure chronic ulcer of other part of right lower leg with fat layer exposed: Secondary | ICD-10-CM | POA: Diagnosis not present

## 2021-06-19 DIAGNOSIS — K219 Gastro-esophageal reflux disease without esophagitis: Secondary | ICD-10-CM | POA: Diagnosis not present

## 2021-06-19 DIAGNOSIS — I872 Venous insufficiency (chronic) (peripheral): Secondary | ICD-10-CM | POA: Diagnosis not present

## 2021-06-19 DIAGNOSIS — L97522 Non-pressure chronic ulcer of other part of left foot with fat layer exposed: Secondary | ICD-10-CM | POA: Diagnosis not present

## 2021-06-19 DIAGNOSIS — Z8614 Personal history of Methicillin resistant Staphylococcus aureus infection: Secondary | ICD-10-CM | POA: Diagnosis not present

## 2021-06-19 DIAGNOSIS — G894 Chronic pain syndrome: Secondary | ICD-10-CM | POA: Diagnosis not present

## 2021-06-19 DIAGNOSIS — Z87891 Personal history of nicotine dependence: Secondary | ICD-10-CM | POA: Diagnosis not present

## 2021-06-19 DIAGNOSIS — Z79891 Long term (current) use of opiate analgesic: Secondary | ICD-10-CM | POA: Diagnosis not present

## 2021-06-19 DIAGNOSIS — I7389 Other specified peripheral vascular diseases: Secondary | ICD-10-CM | POA: Diagnosis not present

## 2021-06-19 DIAGNOSIS — I10 Essential (primary) hypertension: Secondary | ICD-10-CM | POA: Diagnosis not present

## 2021-06-21 DIAGNOSIS — L97522 Non-pressure chronic ulcer of other part of left foot with fat layer exposed: Secondary | ICD-10-CM | POA: Diagnosis not present

## 2021-06-21 DIAGNOSIS — I890 Lymphedema, not elsewhere classified: Secondary | ICD-10-CM | POA: Diagnosis not present

## 2021-06-21 DIAGNOSIS — Z4800 Encounter for change or removal of nonsurgical wound dressing: Secondary | ICD-10-CM | POA: Diagnosis not present

## 2021-06-21 DIAGNOSIS — I872 Venous insufficiency (chronic) (peripheral): Secondary | ICD-10-CM | POA: Diagnosis not present

## 2021-06-21 DIAGNOSIS — L97812 Non-pressure chronic ulcer of other part of right lower leg with fat layer exposed: Secondary | ICD-10-CM | POA: Diagnosis not present

## 2021-06-21 DIAGNOSIS — L97322 Non-pressure chronic ulcer of left ankle with fat layer exposed: Secondary | ICD-10-CM | POA: Diagnosis not present

## 2021-06-24 DIAGNOSIS — L97522 Non-pressure chronic ulcer of other part of left foot with fat layer exposed: Secondary | ICD-10-CM | POA: Diagnosis not present

## 2021-06-24 DIAGNOSIS — L97322 Non-pressure chronic ulcer of left ankle with fat layer exposed: Secondary | ICD-10-CM | POA: Diagnosis not present

## 2021-06-24 DIAGNOSIS — I872 Venous insufficiency (chronic) (peripheral): Secondary | ICD-10-CM | POA: Diagnosis not present

## 2021-06-24 DIAGNOSIS — I890 Lymphedema, not elsewhere classified: Secondary | ICD-10-CM | POA: Diagnosis not present

## 2021-06-24 DIAGNOSIS — L97812 Non-pressure chronic ulcer of other part of right lower leg with fat layer exposed: Secondary | ICD-10-CM | POA: Diagnosis not present

## 2021-06-24 DIAGNOSIS — Z4800 Encounter for change or removal of nonsurgical wound dressing: Secondary | ICD-10-CM | POA: Diagnosis not present

## 2021-06-28 DIAGNOSIS — L97522 Non-pressure chronic ulcer of other part of left foot with fat layer exposed: Secondary | ICD-10-CM | POA: Diagnosis not present

## 2021-06-28 DIAGNOSIS — L97812 Non-pressure chronic ulcer of other part of right lower leg with fat layer exposed: Secondary | ICD-10-CM | POA: Diagnosis not present

## 2021-06-28 DIAGNOSIS — I872 Venous insufficiency (chronic) (peripheral): Secondary | ICD-10-CM | POA: Diagnosis not present

## 2021-06-28 DIAGNOSIS — Z4800 Encounter for change or removal of nonsurgical wound dressing: Secondary | ICD-10-CM | POA: Diagnosis not present

## 2021-06-28 DIAGNOSIS — L97322 Non-pressure chronic ulcer of left ankle with fat layer exposed: Secondary | ICD-10-CM | POA: Diagnosis not present

## 2021-06-28 DIAGNOSIS — I890 Lymphedema, not elsewhere classified: Secondary | ICD-10-CM | POA: Diagnosis not present

## 2021-07-01 DIAGNOSIS — L97522 Non-pressure chronic ulcer of other part of left foot with fat layer exposed: Secondary | ICD-10-CM | POA: Diagnosis not present

## 2021-07-01 DIAGNOSIS — Z4800 Encounter for change or removal of nonsurgical wound dressing: Secondary | ICD-10-CM | POA: Diagnosis not present

## 2021-07-01 DIAGNOSIS — L97322 Non-pressure chronic ulcer of left ankle with fat layer exposed: Secondary | ICD-10-CM | POA: Diagnosis not present

## 2021-07-01 DIAGNOSIS — L97812 Non-pressure chronic ulcer of other part of right lower leg with fat layer exposed: Secondary | ICD-10-CM | POA: Diagnosis not present

## 2021-07-01 DIAGNOSIS — I872 Venous insufficiency (chronic) (peripheral): Secondary | ICD-10-CM | POA: Diagnosis not present

## 2021-07-01 DIAGNOSIS — I890 Lymphedema, not elsewhere classified: Secondary | ICD-10-CM | POA: Diagnosis not present

## 2021-07-05 DIAGNOSIS — I872 Venous insufficiency (chronic) (peripheral): Secondary | ICD-10-CM | POA: Diagnosis not present

## 2021-07-05 DIAGNOSIS — L97522 Non-pressure chronic ulcer of other part of left foot with fat layer exposed: Secondary | ICD-10-CM | POA: Diagnosis not present

## 2021-07-05 DIAGNOSIS — Z4800 Encounter for change or removal of nonsurgical wound dressing: Secondary | ICD-10-CM | POA: Diagnosis not present

## 2021-07-05 DIAGNOSIS — L97812 Non-pressure chronic ulcer of other part of right lower leg with fat layer exposed: Secondary | ICD-10-CM | POA: Diagnosis not present

## 2021-07-05 DIAGNOSIS — L97322 Non-pressure chronic ulcer of left ankle with fat layer exposed: Secondary | ICD-10-CM | POA: Diagnosis not present

## 2021-07-05 DIAGNOSIS — I890 Lymphedema, not elsewhere classified: Secondary | ICD-10-CM | POA: Diagnosis not present

## 2021-07-08 DIAGNOSIS — I872 Venous insufficiency (chronic) (peripheral): Secondary | ICD-10-CM | POA: Diagnosis not present

## 2021-07-08 DIAGNOSIS — L97522 Non-pressure chronic ulcer of other part of left foot with fat layer exposed: Secondary | ICD-10-CM | POA: Diagnosis not present

## 2021-07-08 DIAGNOSIS — I890 Lymphedema, not elsewhere classified: Secondary | ICD-10-CM | POA: Diagnosis not present

## 2021-07-08 DIAGNOSIS — L97812 Non-pressure chronic ulcer of other part of right lower leg with fat layer exposed: Secondary | ICD-10-CM | POA: Diagnosis not present

## 2021-07-08 DIAGNOSIS — Z4800 Encounter for change or removal of nonsurgical wound dressing: Secondary | ICD-10-CM | POA: Diagnosis not present

## 2021-07-08 DIAGNOSIS — L97322 Non-pressure chronic ulcer of left ankle with fat layer exposed: Secondary | ICD-10-CM | POA: Diagnosis not present

## 2021-07-12 DIAGNOSIS — I872 Venous insufficiency (chronic) (peripheral): Secondary | ICD-10-CM | POA: Diagnosis not present

## 2021-07-12 DIAGNOSIS — Z4800 Encounter for change or removal of nonsurgical wound dressing: Secondary | ICD-10-CM | POA: Diagnosis not present

## 2021-07-12 DIAGNOSIS — L97322 Non-pressure chronic ulcer of left ankle with fat layer exposed: Secondary | ICD-10-CM | POA: Diagnosis not present

## 2021-07-12 DIAGNOSIS — L97812 Non-pressure chronic ulcer of other part of right lower leg with fat layer exposed: Secondary | ICD-10-CM | POA: Diagnosis not present

## 2021-07-12 DIAGNOSIS — I890 Lymphedema, not elsewhere classified: Secondary | ICD-10-CM | POA: Diagnosis not present

## 2021-07-12 DIAGNOSIS — L97522 Non-pressure chronic ulcer of other part of left foot with fat layer exposed: Secondary | ICD-10-CM | POA: Diagnosis not present

## 2021-07-15 DIAGNOSIS — I890 Lymphedema, not elsewhere classified: Secondary | ICD-10-CM | POA: Diagnosis not present

## 2021-07-15 DIAGNOSIS — L97812 Non-pressure chronic ulcer of other part of right lower leg with fat layer exposed: Secondary | ICD-10-CM | POA: Diagnosis not present

## 2021-07-15 DIAGNOSIS — L97322 Non-pressure chronic ulcer of left ankle with fat layer exposed: Secondary | ICD-10-CM | POA: Diagnosis not present

## 2021-07-15 DIAGNOSIS — Z4800 Encounter for change or removal of nonsurgical wound dressing: Secondary | ICD-10-CM | POA: Diagnosis not present

## 2021-07-15 DIAGNOSIS — I872 Venous insufficiency (chronic) (peripheral): Secondary | ICD-10-CM | POA: Diagnosis not present

## 2021-07-15 DIAGNOSIS — L97522 Non-pressure chronic ulcer of other part of left foot with fat layer exposed: Secondary | ICD-10-CM | POA: Diagnosis not present

## 2021-07-19 DIAGNOSIS — I7389 Other specified peripheral vascular diseases: Secondary | ICD-10-CM | POA: Diagnosis not present

## 2021-07-19 DIAGNOSIS — Z4800 Encounter for change or removal of nonsurgical wound dressing: Secondary | ICD-10-CM | POA: Diagnosis not present

## 2021-07-19 DIAGNOSIS — E785 Hyperlipidemia, unspecified: Secondary | ICD-10-CM | POA: Diagnosis not present

## 2021-07-19 DIAGNOSIS — Z8614 Personal history of Methicillin resistant Staphylococcus aureus infection: Secondary | ICD-10-CM | POA: Diagnosis not present

## 2021-07-19 DIAGNOSIS — I872 Venous insufficiency (chronic) (peripheral): Secondary | ICD-10-CM | POA: Diagnosis not present

## 2021-07-19 DIAGNOSIS — Z87891 Personal history of nicotine dependence: Secondary | ICD-10-CM | POA: Diagnosis not present

## 2021-07-19 DIAGNOSIS — Z9181 History of falling: Secondary | ICD-10-CM | POA: Diagnosis not present

## 2021-07-19 DIAGNOSIS — I890 Lymphedema, not elsewhere classified: Secondary | ICD-10-CM | POA: Diagnosis not present

## 2021-07-19 DIAGNOSIS — R2689 Other abnormalities of gait and mobility: Secondary | ICD-10-CM | POA: Diagnosis not present

## 2021-07-19 DIAGNOSIS — M170 Bilateral primary osteoarthritis of knee: Secondary | ICD-10-CM | POA: Diagnosis not present

## 2021-07-19 DIAGNOSIS — M810 Age-related osteoporosis without current pathological fracture: Secondary | ICD-10-CM | POA: Diagnosis not present

## 2021-07-19 DIAGNOSIS — L97812 Non-pressure chronic ulcer of other part of right lower leg with fat layer exposed: Secondary | ICD-10-CM | POA: Diagnosis not present

## 2021-07-19 DIAGNOSIS — L97522 Non-pressure chronic ulcer of other part of left foot with fat layer exposed: Secondary | ICD-10-CM | POA: Diagnosis not present

## 2021-07-19 DIAGNOSIS — M6281 Muscle weakness (generalized): Secondary | ICD-10-CM | POA: Diagnosis not present

## 2021-07-19 DIAGNOSIS — Z791 Long term (current) use of non-steroidal anti-inflammatories (NSAID): Secondary | ICD-10-CM | POA: Diagnosis not present

## 2021-07-19 DIAGNOSIS — G894 Chronic pain syndrome: Secondary | ICD-10-CM | POA: Diagnosis not present

## 2021-07-19 DIAGNOSIS — L97322 Non-pressure chronic ulcer of left ankle with fat layer exposed: Secondary | ICD-10-CM | POA: Diagnosis not present

## 2021-07-19 DIAGNOSIS — K219 Gastro-esophageal reflux disease without esophagitis: Secondary | ICD-10-CM | POA: Diagnosis not present

## 2021-07-19 DIAGNOSIS — Z79891 Long term (current) use of opiate analgesic: Secondary | ICD-10-CM | POA: Diagnosis not present

## 2021-07-19 DIAGNOSIS — I10 Essential (primary) hypertension: Secondary | ICD-10-CM | POA: Diagnosis not present

## 2021-07-22 DIAGNOSIS — L97522 Non-pressure chronic ulcer of other part of left foot with fat layer exposed: Secondary | ICD-10-CM | POA: Diagnosis not present

## 2021-07-22 DIAGNOSIS — Z4800 Encounter for change or removal of nonsurgical wound dressing: Secondary | ICD-10-CM | POA: Diagnosis not present

## 2021-07-22 DIAGNOSIS — L97812 Non-pressure chronic ulcer of other part of right lower leg with fat layer exposed: Secondary | ICD-10-CM | POA: Diagnosis not present

## 2021-07-22 DIAGNOSIS — L97322 Non-pressure chronic ulcer of left ankle with fat layer exposed: Secondary | ICD-10-CM | POA: Diagnosis not present

## 2021-07-22 DIAGNOSIS — I890 Lymphedema, not elsewhere classified: Secondary | ICD-10-CM | POA: Diagnosis not present

## 2021-07-22 DIAGNOSIS — I872 Venous insufficiency (chronic) (peripheral): Secondary | ICD-10-CM | POA: Diagnosis not present

## 2021-07-25 DIAGNOSIS — I872 Venous insufficiency (chronic) (peripheral): Secondary | ICD-10-CM | POA: Diagnosis not present

## 2021-07-25 DIAGNOSIS — I890 Lymphedema, not elsewhere classified: Secondary | ICD-10-CM | POA: Diagnosis not present

## 2021-07-25 DIAGNOSIS — L97322 Non-pressure chronic ulcer of left ankle with fat layer exposed: Secondary | ICD-10-CM | POA: Diagnosis not present

## 2021-07-25 DIAGNOSIS — Z4800 Encounter for change or removal of nonsurgical wound dressing: Secondary | ICD-10-CM | POA: Diagnosis not present

## 2021-07-25 DIAGNOSIS — L97812 Non-pressure chronic ulcer of other part of right lower leg with fat layer exposed: Secondary | ICD-10-CM | POA: Diagnosis not present

## 2021-07-25 DIAGNOSIS — L97522 Non-pressure chronic ulcer of other part of left foot with fat layer exposed: Secondary | ICD-10-CM | POA: Diagnosis not present

## 2021-07-29 DIAGNOSIS — L97322 Non-pressure chronic ulcer of left ankle with fat layer exposed: Secondary | ICD-10-CM | POA: Diagnosis not present

## 2021-07-29 DIAGNOSIS — I872 Venous insufficiency (chronic) (peripheral): Secondary | ICD-10-CM | POA: Diagnosis not present

## 2021-07-29 DIAGNOSIS — L97812 Non-pressure chronic ulcer of other part of right lower leg with fat layer exposed: Secondary | ICD-10-CM | POA: Diagnosis not present

## 2021-07-29 DIAGNOSIS — Z4800 Encounter for change or removal of nonsurgical wound dressing: Secondary | ICD-10-CM | POA: Diagnosis not present

## 2021-07-29 DIAGNOSIS — I890 Lymphedema, not elsewhere classified: Secondary | ICD-10-CM | POA: Diagnosis not present

## 2021-07-29 DIAGNOSIS — L97522 Non-pressure chronic ulcer of other part of left foot with fat layer exposed: Secondary | ICD-10-CM | POA: Diagnosis not present

## 2021-08-02 DIAGNOSIS — L97522 Non-pressure chronic ulcer of other part of left foot with fat layer exposed: Secondary | ICD-10-CM | POA: Diagnosis not present

## 2021-08-02 DIAGNOSIS — Z4800 Encounter for change or removal of nonsurgical wound dressing: Secondary | ICD-10-CM | POA: Diagnosis not present

## 2021-08-02 DIAGNOSIS — I872 Venous insufficiency (chronic) (peripheral): Secondary | ICD-10-CM | POA: Diagnosis not present

## 2021-08-02 DIAGNOSIS — L97812 Non-pressure chronic ulcer of other part of right lower leg with fat layer exposed: Secondary | ICD-10-CM | POA: Diagnosis not present

## 2021-08-02 DIAGNOSIS — L97322 Non-pressure chronic ulcer of left ankle with fat layer exposed: Secondary | ICD-10-CM | POA: Diagnosis not present

## 2021-08-02 DIAGNOSIS — I890 Lymphedema, not elsewhere classified: Secondary | ICD-10-CM | POA: Diagnosis not present

## 2021-08-05 DIAGNOSIS — I890 Lymphedema, not elsewhere classified: Secondary | ICD-10-CM | POA: Diagnosis not present

## 2021-08-05 DIAGNOSIS — L97812 Non-pressure chronic ulcer of other part of right lower leg with fat layer exposed: Secondary | ICD-10-CM | POA: Diagnosis not present

## 2021-08-05 DIAGNOSIS — L97522 Non-pressure chronic ulcer of other part of left foot with fat layer exposed: Secondary | ICD-10-CM | POA: Diagnosis not present

## 2021-08-05 DIAGNOSIS — Z4800 Encounter for change or removal of nonsurgical wound dressing: Secondary | ICD-10-CM | POA: Diagnosis not present

## 2021-08-05 DIAGNOSIS — L97322 Non-pressure chronic ulcer of left ankle with fat layer exposed: Secondary | ICD-10-CM | POA: Diagnosis not present

## 2021-08-05 DIAGNOSIS — I872 Venous insufficiency (chronic) (peripheral): Secondary | ICD-10-CM | POA: Diagnosis not present

## 2021-08-09 DIAGNOSIS — L97322 Non-pressure chronic ulcer of left ankle with fat layer exposed: Secondary | ICD-10-CM | POA: Diagnosis not present

## 2021-08-09 DIAGNOSIS — L97522 Non-pressure chronic ulcer of other part of left foot with fat layer exposed: Secondary | ICD-10-CM | POA: Diagnosis not present

## 2021-08-09 DIAGNOSIS — I890 Lymphedema, not elsewhere classified: Secondary | ICD-10-CM | POA: Diagnosis not present

## 2021-08-09 DIAGNOSIS — Z4800 Encounter for change or removal of nonsurgical wound dressing: Secondary | ICD-10-CM | POA: Diagnosis not present

## 2021-08-09 DIAGNOSIS — L97812 Non-pressure chronic ulcer of other part of right lower leg with fat layer exposed: Secondary | ICD-10-CM | POA: Diagnosis not present

## 2021-08-09 DIAGNOSIS — I872 Venous insufficiency (chronic) (peripheral): Secondary | ICD-10-CM | POA: Diagnosis not present

## 2021-08-12 DIAGNOSIS — L97522 Non-pressure chronic ulcer of other part of left foot with fat layer exposed: Secondary | ICD-10-CM | POA: Diagnosis not present

## 2021-08-12 DIAGNOSIS — I890 Lymphedema, not elsewhere classified: Secondary | ICD-10-CM | POA: Diagnosis not present

## 2021-08-12 DIAGNOSIS — Z4800 Encounter for change or removal of nonsurgical wound dressing: Secondary | ICD-10-CM | POA: Diagnosis not present

## 2021-08-12 DIAGNOSIS — L97812 Non-pressure chronic ulcer of other part of right lower leg with fat layer exposed: Secondary | ICD-10-CM | POA: Diagnosis not present

## 2021-08-12 DIAGNOSIS — L97322 Non-pressure chronic ulcer of left ankle with fat layer exposed: Secondary | ICD-10-CM | POA: Diagnosis not present

## 2021-08-12 DIAGNOSIS — I872 Venous insufficiency (chronic) (peripheral): Secondary | ICD-10-CM | POA: Diagnosis not present

## 2021-08-15 DIAGNOSIS — L97522 Non-pressure chronic ulcer of other part of left foot with fat layer exposed: Secondary | ICD-10-CM | POA: Diagnosis not present

## 2021-08-15 DIAGNOSIS — L97322 Non-pressure chronic ulcer of left ankle with fat layer exposed: Secondary | ICD-10-CM | POA: Diagnosis not present

## 2021-08-15 DIAGNOSIS — I890 Lymphedema, not elsewhere classified: Secondary | ICD-10-CM | POA: Diagnosis not present

## 2021-08-15 DIAGNOSIS — I872 Venous insufficiency (chronic) (peripheral): Secondary | ICD-10-CM | POA: Diagnosis not present

## 2021-08-15 DIAGNOSIS — L97812 Non-pressure chronic ulcer of other part of right lower leg with fat layer exposed: Secondary | ICD-10-CM | POA: Diagnosis not present

## 2021-08-15 DIAGNOSIS — Z4800 Encounter for change or removal of nonsurgical wound dressing: Secondary | ICD-10-CM | POA: Diagnosis not present

## 2021-08-17 DIAGNOSIS — L97929 Non-pressure chronic ulcer of unspecified part of left lower leg with unspecified severity: Secondary | ICD-10-CM | POA: Diagnosis not present

## 2021-08-17 DIAGNOSIS — I83029 Varicose veins of left lower extremity with ulcer of unspecified site: Secondary | ICD-10-CM | POA: Diagnosis not present

## 2021-08-17 DIAGNOSIS — I83028 Varicose veins of left lower extremity with ulcer other part of lower leg: Secondary | ICD-10-CM | POA: Diagnosis not present

## 2021-08-17 DIAGNOSIS — I83019 Varicose veins of right lower extremity with ulcer of unspecified site: Secondary | ICD-10-CM | POA: Diagnosis not present

## 2021-08-17 DIAGNOSIS — L97829 Non-pressure chronic ulcer of other part of left lower leg with unspecified severity: Secondary | ICD-10-CM | POA: Diagnosis not present

## 2021-08-17 DIAGNOSIS — I890 Lymphedema, not elsewhere classified: Secondary | ICD-10-CM | POA: Diagnosis not present

## 2021-08-17 DIAGNOSIS — L97919 Non-pressure chronic ulcer of unspecified part of right lower leg with unspecified severity: Secondary | ICD-10-CM | POA: Diagnosis not present

## 2021-08-17 DIAGNOSIS — M25569 Pain in unspecified knee: Secondary | ICD-10-CM | POA: Diagnosis not present

## 2021-08-17 DIAGNOSIS — M810 Age-related osteoporosis without current pathological fracture: Secondary | ICD-10-CM | POA: Diagnosis not present

## 2021-08-17 DIAGNOSIS — I83018 Varicose veins of right lower extremity with ulcer other part of lower leg: Secondary | ICD-10-CM | POA: Diagnosis not present

## 2021-08-17 DIAGNOSIS — I10 Essential (primary) hypertension: Secondary | ICD-10-CM | POA: Diagnosis not present

## 2021-08-17 DIAGNOSIS — L97819 Non-pressure chronic ulcer of other part of right lower leg with unspecified severity: Secondary | ICD-10-CM | POA: Diagnosis not present

## 2021-08-17 DIAGNOSIS — G8929 Other chronic pain: Secondary | ICD-10-CM | POA: Diagnosis not present

## 2021-08-18 DIAGNOSIS — L97322 Non-pressure chronic ulcer of left ankle with fat layer exposed: Secondary | ICD-10-CM | POA: Diagnosis not present

## 2021-08-18 DIAGNOSIS — Z8614 Personal history of Methicillin resistant Staphylococcus aureus infection: Secondary | ICD-10-CM | POA: Diagnosis not present

## 2021-08-18 DIAGNOSIS — G894 Chronic pain syndrome: Secondary | ICD-10-CM | POA: Diagnosis not present

## 2021-08-18 DIAGNOSIS — Z9181 History of falling: Secondary | ICD-10-CM | POA: Diagnosis not present

## 2021-08-18 DIAGNOSIS — I10 Essential (primary) hypertension: Secondary | ICD-10-CM | POA: Diagnosis not present

## 2021-08-18 DIAGNOSIS — I872 Venous insufficiency (chronic) (peripheral): Secondary | ICD-10-CM | POA: Diagnosis not present

## 2021-08-18 DIAGNOSIS — M810 Age-related osteoporosis without current pathological fracture: Secondary | ICD-10-CM | POA: Diagnosis not present

## 2021-08-18 DIAGNOSIS — Z87891 Personal history of nicotine dependence: Secondary | ICD-10-CM | POA: Diagnosis not present

## 2021-08-18 DIAGNOSIS — L97522 Non-pressure chronic ulcer of other part of left foot with fat layer exposed: Secondary | ICD-10-CM | POA: Diagnosis not present

## 2021-08-18 DIAGNOSIS — M6281 Muscle weakness (generalized): Secondary | ICD-10-CM | POA: Diagnosis not present

## 2021-08-18 DIAGNOSIS — Z79891 Long term (current) use of opiate analgesic: Secondary | ICD-10-CM | POA: Diagnosis not present

## 2021-08-18 DIAGNOSIS — R2689 Other abnormalities of gait and mobility: Secondary | ICD-10-CM | POA: Diagnosis not present

## 2021-08-18 DIAGNOSIS — K219 Gastro-esophageal reflux disease without esophagitis: Secondary | ICD-10-CM | POA: Diagnosis not present

## 2021-08-18 DIAGNOSIS — I7389 Other specified peripheral vascular diseases: Secondary | ICD-10-CM | POA: Diagnosis not present

## 2021-08-18 DIAGNOSIS — M170 Bilateral primary osteoarthritis of knee: Secondary | ICD-10-CM | POA: Diagnosis not present

## 2021-08-18 DIAGNOSIS — I890 Lymphedema, not elsewhere classified: Secondary | ICD-10-CM | POA: Diagnosis not present

## 2021-08-18 DIAGNOSIS — Z4800 Encounter for change or removal of nonsurgical wound dressing: Secondary | ICD-10-CM | POA: Diagnosis not present

## 2021-08-18 DIAGNOSIS — Z791 Long term (current) use of non-steroidal anti-inflammatories (NSAID): Secondary | ICD-10-CM | POA: Diagnosis not present

## 2021-08-18 DIAGNOSIS — E785 Hyperlipidemia, unspecified: Secondary | ICD-10-CM | POA: Diagnosis not present

## 2021-08-18 DIAGNOSIS — L97812 Non-pressure chronic ulcer of other part of right lower leg with fat layer exposed: Secondary | ICD-10-CM | POA: Diagnosis not present

## 2021-08-19 DIAGNOSIS — L97322 Non-pressure chronic ulcer of left ankle with fat layer exposed: Secondary | ICD-10-CM | POA: Diagnosis not present

## 2021-08-19 DIAGNOSIS — I872 Venous insufficiency (chronic) (peripheral): Secondary | ICD-10-CM | POA: Diagnosis not present

## 2021-08-19 DIAGNOSIS — I890 Lymphedema, not elsewhere classified: Secondary | ICD-10-CM | POA: Diagnosis not present

## 2021-08-19 DIAGNOSIS — L97522 Non-pressure chronic ulcer of other part of left foot with fat layer exposed: Secondary | ICD-10-CM | POA: Diagnosis not present

## 2021-08-19 DIAGNOSIS — Z4800 Encounter for change or removal of nonsurgical wound dressing: Secondary | ICD-10-CM | POA: Diagnosis not present

## 2021-08-19 DIAGNOSIS — L97812 Non-pressure chronic ulcer of other part of right lower leg with fat layer exposed: Secondary | ICD-10-CM | POA: Diagnosis not present

## 2021-08-23 DIAGNOSIS — I872 Venous insufficiency (chronic) (peripheral): Secondary | ICD-10-CM | POA: Diagnosis not present

## 2021-08-23 DIAGNOSIS — L97322 Non-pressure chronic ulcer of left ankle with fat layer exposed: Secondary | ICD-10-CM | POA: Diagnosis not present

## 2021-08-23 DIAGNOSIS — Z4800 Encounter for change or removal of nonsurgical wound dressing: Secondary | ICD-10-CM | POA: Diagnosis not present

## 2021-08-23 DIAGNOSIS — I890 Lymphedema, not elsewhere classified: Secondary | ICD-10-CM | POA: Diagnosis not present

## 2021-08-23 DIAGNOSIS — L97812 Non-pressure chronic ulcer of other part of right lower leg with fat layer exposed: Secondary | ICD-10-CM | POA: Diagnosis not present

## 2021-08-23 DIAGNOSIS — L97522 Non-pressure chronic ulcer of other part of left foot with fat layer exposed: Secondary | ICD-10-CM | POA: Diagnosis not present

## 2021-08-26 DIAGNOSIS — I872 Venous insufficiency (chronic) (peripheral): Secondary | ICD-10-CM | POA: Diagnosis not present

## 2021-08-26 DIAGNOSIS — I890 Lymphedema, not elsewhere classified: Secondary | ICD-10-CM | POA: Diagnosis not present

## 2021-08-26 DIAGNOSIS — L97522 Non-pressure chronic ulcer of other part of left foot with fat layer exposed: Secondary | ICD-10-CM | POA: Diagnosis not present

## 2021-08-26 DIAGNOSIS — Z4800 Encounter for change or removal of nonsurgical wound dressing: Secondary | ICD-10-CM | POA: Diagnosis not present

## 2021-08-26 DIAGNOSIS — L97322 Non-pressure chronic ulcer of left ankle with fat layer exposed: Secondary | ICD-10-CM | POA: Diagnosis not present

## 2021-08-26 DIAGNOSIS — L97812 Non-pressure chronic ulcer of other part of right lower leg with fat layer exposed: Secondary | ICD-10-CM | POA: Diagnosis not present

## 2021-08-29 DIAGNOSIS — M170 Bilateral primary osteoarthritis of knee: Secondary | ICD-10-CM | POA: Diagnosis not present

## 2021-08-29 DIAGNOSIS — Z4800 Encounter for change or removal of nonsurgical wound dressing: Secondary | ICD-10-CM | POA: Diagnosis not present

## 2021-08-29 DIAGNOSIS — M810 Age-related osteoporosis without current pathological fracture: Secondary | ICD-10-CM | POA: Diagnosis not present

## 2021-08-29 DIAGNOSIS — K219 Gastro-esophageal reflux disease without esophagitis: Secondary | ICD-10-CM | POA: Diagnosis not present

## 2021-08-29 DIAGNOSIS — Z9181 History of falling: Secondary | ICD-10-CM | POA: Diagnosis not present

## 2021-08-29 DIAGNOSIS — I890 Lymphedema, not elsewhere classified: Secondary | ICD-10-CM | POA: Diagnosis not present

## 2021-08-29 DIAGNOSIS — I7389 Other specified peripheral vascular diseases: Secondary | ICD-10-CM | POA: Diagnosis not present

## 2021-08-29 DIAGNOSIS — L97522 Non-pressure chronic ulcer of other part of left foot with fat layer exposed: Secondary | ICD-10-CM | POA: Diagnosis not present

## 2021-08-29 DIAGNOSIS — I872 Venous insufficiency (chronic) (peripheral): Secondary | ICD-10-CM | POA: Diagnosis not present

## 2021-08-29 DIAGNOSIS — E785 Hyperlipidemia, unspecified: Secondary | ICD-10-CM | POA: Diagnosis not present

## 2021-08-29 DIAGNOSIS — L97322 Non-pressure chronic ulcer of left ankle with fat layer exposed: Secondary | ICD-10-CM | POA: Diagnosis not present

## 2021-08-29 DIAGNOSIS — L97812 Non-pressure chronic ulcer of other part of right lower leg with fat layer exposed: Secondary | ICD-10-CM | POA: Diagnosis not present

## 2021-09-01 DIAGNOSIS — Z4800 Encounter for change or removal of nonsurgical wound dressing: Secondary | ICD-10-CM | POA: Diagnosis not present

## 2021-09-01 DIAGNOSIS — L97322 Non-pressure chronic ulcer of left ankle with fat layer exposed: Secondary | ICD-10-CM | POA: Diagnosis not present

## 2021-09-01 DIAGNOSIS — I890 Lymphedema, not elsewhere classified: Secondary | ICD-10-CM | POA: Diagnosis not present

## 2021-09-01 DIAGNOSIS — L97812 Non-pressure chronic ulcer of other part of right lower leg with fat layer exposed: Secondary | ICD-10-CM | POA: Diagnosis not present

## 2021-09-01 DIAGNOSIS — I872 Venous insufficiency (chronic) (peripheral): Secondary | ICD-10-CM | POA: Diagnosis not present

## 2021-09-01 DIAGNOSIS — L97522 Non-pressure chronic ulcer of other part of left foot with fat layer exposed: Secondary | ICD-10-CM | POA: Diagnosis not present

## 2021-09-05 DIAGNOSIS — Z4800 Encounter for change or removal of nonsurgical wound dressing: Secondary | ICD-10-CM | POA: Diagnosis not present

## 2021-09-05 DIAGNOSIS — L97812 Non-pressure chronic ulcer of other part of right lower leg with fat layer exposed: Secondary | ICD-10-CM | POA: Diagnosis not present

## 2021-09-05 DIAGNOSIS — L97322 Non-pressure chronic ulcer of left ankle with fat layer exposed: Secondary | ICD-10-CM | POA: Diagnosis not present

## 2021-09-05 DIAGNOSIS — I872 Venous insufficiency (chronic) (peripheral): Secondary | ICD-10-CM | POA: Diagnosis not present

## 2021-09-05 DIAGNOSIS — L97522 Non-pressure chronic ulcer of other part of left foot with fat layer exposed: Secondary | ICD-10-CM | POA: Diagnosis not present

## 2021-09-05 DIAGNOSIS — I890 Lymphedema, not elsewhere classified: Secondary | ICD-10-CM | POA: Diagnosis not present

## 2021-09-06 DIAGNOSIS — L600 Ingrowing nail: Secondary | ICD-10-CM | POA: Diagnosis not present

## 2021-09-06 DIAGNOSIS — M79674 Pain in right toe(s): Secondary | ICD-10-CM | POA: Diagnosis not present

## 2021-09-06 DIAGNOSIS — I739 Peripheral vascular disease, unspecified: Secondary | ICD-10-CM | POA: Diagnosis not present

## 2021-09-06 DIAGNOSIS — B351 Tinea unguium: Secondary | ICD-10-CM | POA: Diagnosis not present

## 2021-09-06 DIAGNOSIS — M79675 Pain in left toe(s): Secondary | ICD-10-CM | POA: Diagnosis not present

## 2021-09-06 DIAGNOSIS — L84 Corns and callosities: Secondary | ICD-10-CM | POA: Diagnosis not present

## 2021-09-06 DIAGNOSIS — L602 Onychogryphosis: Secondary | ICD-10-CM | POA: Diagnosis not present

## 2021-09-08 DIAGNOSIS — Z4800 Encounter for change or removal of nonsurgical wound dressing: Secondary | ICD-10-CM | POA: Diagnosis not present

## 2021-09-08 DIAGNOSIS — L97322 Non-pressure chronic ulcer of left ankle with fat layer exposed: Secondary | ICD-10-CM | POA: Diagnosis not present

## 2021-09-08 DIAGNOSIS — L97522 Non-pressure chronic ulcer of other part of left foot with fat layer exposed: Secondary | ICD-10-CM | POA: Diagnosis not present

## 2021-09-08 DIAGNOSIS — L97812 Non-pressure chronic ulcer of other part of right lower leg with fat layer exposed: Secondary | ICD-10-CM | POA: Diagnosis not present

## 2021-09-08 DIAGNOSIS — I890 Lymphedema, not elsewhere classified: Secondary | ICD-10-CM | POA: Diagnosis not present

## 2021-09-08 DIAGNOSIS — I872 Venous insufficiency (chronic) (peripheral): Secondary | ICD-10-CM | POA: Diagnosis not present

## 2021-09-12 DIAGNOSIS — L97812 Non-pressure chronic ulcer of other part of right lower leg with fat layer exposed: Secondary | ICD-10-CM | POA: Diagnosis not present

## 2021-09-12 DIAGNOSIS — L97322 Non-pressure chronic ulcer of left ankle with fat layer exposed: Secondary | ICD-10-CM | POA: Diagnosis not present

## 2021-09-12 DIAGNOSIS — I872 Venous insufficiency (chronic) (peripheral): Secondary | ICD-10-CM | POA: Diagnosis not present

## 2021-09-12 DIAGNOSIS — I890 Lymphedema, not elsewhere classified: Secondary | ICD-10-CM | POA: Diagnosis not present

## 2021-09-12 DIAGNOSIS — Z4800 Encounter for change or removal of nonsurgical wound dressing: Secondary | ICD-10-CM | POA: Diagnosis not present

## 2021-09-12 DIAGNOSIS — L97522 Non-pressure chronic ulcer of other part of left foot with fat layer exposed: Secondary | ICD-10-CM | POA: Diagnosis not present

## 2021-09-15 DIAGNOSIS — I890 Lymphedema, not elsewhere classified: Secondary | ICD-10-CM | POA: Diagnosis not present

## 2021-09-15 DIAGNOSIS — Z4800 Encounter for change or removal of nonsurgical wound dressing: Secondary | ICD-10-CM | POA: Diagnosis not present

## 2021-09-15 DIAGNOSIS — L97322 Non-pressure chronic ulcer of left ankle with fat layer exposed: Secondary | ICD-10-CM | POA: Diagnosis not present

## 2021-09-15 DIAGNOSIS — L97812 Non-pressure chronic ulcer of other part of right lower leg with fat layer exposed: Secondary | ICD-10-CM | POA: Diagnosis not present

## 2021-09-15 DIAGNOSIS — L97522 Non-pressure chronic ulcer of other part of left foot with fat layer exposed: Secondary | ICD-10-CM | POA: Diagnosis not present

## 2021-09-15 DIAGNOSIS — I872 Venous insufficiency (chronic) (peripheral): Secondary | ICD-10-CM | POA: Diagnosis not present

## 2021-09-19 DIAGNOSIS — I890 Lymphedema, not elsewhere classified: Secondary | ICD-10-CM | POA: Diagnosis not present

## 2021-09-19 DIAGNOSIS — I872 Venous insufficiency (chronic) (peripheral): Secondary | ICD-10-CM | POA: Diagnosis not present

## 2021-09-19 DIAGNOSIS — L97812 Non-pressure chronic ulcer of other part of right lower leg with fat layer exposed: Secondary | ICD-10-CM | POA: Diagnosis not present

## 2021-09-19 DIAGNOSIS — L97322 Non-pressure chronic ulcer of left ankle with fat layer exposed: Secondary | ICD-10-CM | POA: Diagnosis not present

## 2021-09-19 DIAGNOSIS — L97522 Non-pressure chronic ulcer of other part of left foot with fat layer exposed: Secondary | ICD-10-CM | POA: Diagnosis not present

## 2021-09-19 DIAGNOSIS — Z4800 Encounter for change or removal of nonsurgical wound dressing: Secondary | ICD-10-CM | POA: Diagnosis not present

## 2021-09-21 DIAGNOSIS — H524 Presbyopia: Secondary | ICD-10-CM | POA: Diagnosis not present

## 2021-09-21 DIAGNOSIS — Z961 Presence of intraocular lens: Secondary | ICD-10-CM | POA: Diagnosis not present

## 2021-09-21 DIAGNOSIS — H26491 Other secondary cataract, right eye: Secondary | ICD-10-CM | POA: Diagnosis not present

## 2021-09-21 DIAGNOSIS — H43813 Vitreous degeneration, bilateral: Secondary | ICD-10-CM | POA: Diagnosis not present

## 2021-09-22 DIAGNOSIS — L97812 Non-pressure chronic ulcer of other part of right lower leg with fat layer exposed: Secondary | ICD-10-CM | POA: Diagnosis not present

## 2021-09-22 DIAGNOSIS — L97522 Non-pressure chronic ulcer of other part of left foot with fat layer exposed: Secondary | ICD-10-CM | POA: Diagnosis not present

## 2021-09-22 DIAGNOSIS — I890 Lymphedema, not elsewhere classified: Secondary | ICD-10-CM | POA: Diagnosis not present

## 2021-09-22 DIAGNOSIS — L97322 Non-pressure chronic ulcer of left ankle with fat layer exposed: Secondary | ICD-10-CM | POA: Diagnosis not present

## 2021-09-22 DIAGNOSIS — I872 Venous insufficiency (chronic) (peripheral): Secondary | ICD-10-CM | POA: Diagnosis not present

## 2021-09-22 DIAGNOSIS — Z4800 Encounter for change or removal of nonsurgical wound dressing: Secondary | ICD-10-CM | POA: Diagnosis not present

## 2021-09-26 DIAGNOSIS — Z4800 Encounter for change or removal of nonsurgical wound dressing: Secondary | ICD-10-CM | POA: Diagnosis not present

## 2021-09-26 DIAGNOSIS — L97522 Non-pressure chronic ulcer of other part of left foot with fat layer exposed: Secondary | ICD-10-CM | POA: Diagnosis not present

## 2021-09-26 DIAGNOSIS — L97322 Non-pressure chronic ulcer of left ankle with fat layer exposed: Secondary | ICD-10-CM | POA: Diagnosis not present

## 2021-09-26 DIAGNOSIS — I872 Venous insufficiency (chronic) (peripheral): Secondary | ICD-10-CM | POA: Diagnosis not present

## 2021-09-26 DIAGNOSIS — I890 Lymphedema, not elsewhere classified: Secondary | ICD-10-CM | POA: Diagnosis not present

## 2021-09-26 DIAGNOSIS — L97812 Non-pressure chronic ulcer of other part of right lower leg with fat layer exposed: Secondary | ICD-10-CM | POA: Diagnosis not present

## 2021-09-28 DIAGNOSIS — L97311 Non-pressure chronic ulcer of right ankle limited to breakdown of skin: Secondary | ICD-10-CM | POA: Diagnosis not present

## 2021-09-28 DIAGNOSIS — I10 Essential (primary) hypertension: Secondary | ICD-10-CM | POA: Diagnosis not present

## 2021-09-28 DIAGNOSIS — M25569 Pain in unspecified knee: Secondary | ICD-10-CM | POA: Diagnosis not present

## 2021-09-28 DIAGNOSIS — L97322 Non-pressure chronic ulcer of left ankle with fat layer exposed: Secondary | ICD-10-CM | POA: Diagnosis not present

## 2021-09-28 DIAGNOSIS — E785 Hyperlipidemia, unspecified: Secondary | ICD-10-CM | POA: Diagnosis not present

## 2021-09-28 DIAGNOSIS — M810 Age-related osteoporosis without current pathological fracture: Secondary | ICD-10-CM | POA: Diagnosis not present

## 2021-09-28 DIAGNOSIS — K219 Gastro-esophageal reflux disease without esophagitis: Secondary | ICD-10-CM | POA: Diagnosis not present

## 2021-09-28 DIAGNOSIS — G8929 Other chronic pain: Secondary | ICD-10-CM | POA: Diagnosis not present

## 2021-09-28 DIAGNOSIS — M170 Bilateral primary osteoarthritis of knee: Secondary | ICD-10-CM | POA: Diagnosis not present

## 2021-09-28 DIAGNOSIS — Z79899 Other long term (current) drug therapy: Secondary | ICD-10-CM | POA: Diagnosis not present

## 2021-09-28 DIAGNOSIS — I890 Lymphedema, not elsewhere classified: Secondary | ICD-10-CM | POA: Diagnosis not present

## 2021-09-28 DIAGNOSIS — Z4800 Encounter for change or removal of nonsurgical wound dressing: Secondary | ICD-10-CM | POA: Diagnosis not present

## 2021-09-28 DIAGNOSIS — I7389 Other specified peripheral vascular diseases: Secondary | ICD-10-CM | POA: Diagnosis not present

## 2021-09-28 DIAGNOSIS — L97522 Non-pressure chronic ulcer of other part of left foot with fat layer exposed: Secondary | ICD-10-CM | POA: Diagnosis not present

## 2021-09-28 DIAGNOSIS — L97521 Non-pressure chronic ulcer of other part of left foot limited to breakdown of skin: Secondary | ICD-10-CM | POA: Diagnosis not present

## 2021-09-28 DIAGNOSIS — L97812 Non-pressure chronic ulcer of other part of right lower leg with fat layer exposed: Secondary | ICD-10-CM | POA: Diagnosis not present

## 2021-09-28 DIAGNOSIS — I872 Venous insufficiency (chronic) (peripheral): Secondary | ICD-10-CM | POA: Diagnosis not present

## 2021-09-28 DIAGNOSIS — Z9181 History of falling: Secondary | ICD-10-CM | POA: Diagnosis not present

## 2021-10-03 DIAGNOSIS — L97812 Non-pressure chronic ulcer of other part of right lower leg with fat layer exposed: Secondary | ICD-10-CM | POA: Diagnosis not present

## 2021-10-03 DIAGNOSIS — I890 Lymphedema, not elsewhere classified: Secondary | ICD-10-CM | POA: Diagnosis not present

## 2021-10-03 DIAGNOSIS — L97322 Non-pressure chronic ulcer of left ankle with fat layer exposed: Secondary | ICD-10-CM | POA: Diagnosis not present

## 2021-10-03 DIAGNOSIS — L97522 Non-pressure chronic ulcer of other part of left foot with fat layer exposed: Secondary | ICD-10-CM | POA: Diagnosis not present

## 2021-10-03 DIAGNOSIS — Z4800 Encounter for change or removal of nonsurgical wound dressing: Secondary | ICD-10-CM | POA: Diagnosis not present

## 2021-10-03 DIAGNOSIS — I872 Venous insufficiency (chronic) (peripheral): Secondary | ICD-10-CM | POA: Diagnosis not present

## 2021-10-06 DIAGNOSIS — I890 Lymphedema, not elsewhere classified: Secondary | ICD-10-CM | POA: Diagnosis not present

## 2021-10-06 DIAGNOSIS — I872 Venous insufficiency (chronic) (peripheral): Secondary | ICD-10-CM | POA: Diagnosis not present

## 2021-10-06 DIAGNOSIS — L97322 Non-pressure chronic ulcer of left ankle with fat layer exposed: Secondary | ICD-10-CM | POA: Diagnosis not present

## 2021-10-06 DIAGNOSIS — L97812 Non-pressure chronic ulcer of other part of right lower leg with fat layer exposed: Secondary | ICD-10-CM | POA: Diagnosis not present

## 2021-10-06 DIAGNOSIS — Z4800 Encounter for change or removal of nonsurgical wound dressing: Secondary | ICD-10-CM | POA: Diagnosis not present

## 2021-10-06 DIAGNOSIS — L97522 Non-pressure chronic ulcer of other part of left foot with fat layer exposed: Secondary | ICD-10-CM | POA: Diagnosis not present

## 2021-10-10 DIAGNOSIS — Z4800 Encounter for change or removal of nonsurgical wound dressing: Secondary | ICD-10-CM | POA: Diagnosis not present

## 2021-10-10 DIAGNOSIS — L97812 Non-pressure chronic ulcer of other part of right lower leg with fat layer exposed: Secondary | ICD-10-CM | POA: Diagnosis not present

## 2021-10-10 DIAGNOSIS — I872 Venous insufficiency (chronic) (peripheral): Secondary | ICD-10-CM | POA: Diagnosis not present

## 2021-10-10 DIAGNOSIS — L97522 Non-pressure chronic ulcer of other part of left foot with fat layer exposed: Secondary | ICD-10-CM | POA: Diagnosis not present

## 2021-10-10 DIAGNOSIS — L97322 Non-pressure chronic ulcer of left ankle with fat layer exposed: Secondary | ICD-10-CM | POA: Diagnosis not present

## 2021-10-10 DIAGNOSIS — I890 Lymphedema, not elsewhere classified: Secondary | ICD-10-CM | POA: Diagnosis not present

## 2021-10-13 DIAGNOSIS — Z4800 Encounter for change or removal of nonsurgical wound dressing: Secondary | ICD-10-CM | POA: Diagnosis not present

## 2021-10-13 DIAGNOSIS — I890 Lymphedema, not elsewhere classified: Secondary | ICD-10-CM | POA: Diagnosis not present

## 2021-10-13 DIAGNOSIS — L97322 Non-pressure chronic ulcer of left ankle with fat layer exposed: Secondary | ICD-10-CM | POA: Diagnosis not present

## 2021-10-13 DIAGNOSIS — L97522 Non-pressure chronic ulcer of other part of left foot with fat layer exposed: Secondary | ICD-10-CM | POA: Diagnosis not present

## 2021-10-13 DIAGNOSIS — L97812 Non-pressure chronic ulcer of other part of right lower leg with fat layer exposed: Secondary | ICD-10-CM | POA: Diagnosis not present

## 2021-10-13 DIAGNOSIS — I872 Venous insufficiency (chronic) (peripheral): Secondary | ICD-10-CM | POA: Diagnosis not present

## 2021-10-17 DIAGNOSIS — L97812 Non-pressure chronic ulcer of other part of right lower leg with fat layer exposed: Secondary | ICD-10-CM | POA: Diagnosis not present

## 2021-10-17 DIAGNOSIS — L97322 Non-pressure chronic ulcer of left ankle with fat layer exposed: Secondary | ICD-10-CM | POA: Diagnosis not present

## 2021-10-17 DIAGNOSIS — Z4800 Encounter for change or removal of nonsurgical wound dressing: Secondary | ICD-10-CM | POA: Diagnosis not present

## 2021-10-17 DIAGNOSIS — L97522 Non-pressure chronic ulcer of other part of left foot with fat layer exposed: Secondary | ICD-10-CM | POA: Diagnosis not present

## 2021-10-17 DIAGNOSIS — I872 Venous insufficiency (chronic) (peripheral): Secondary | ICD-10-CM | POA: Diagnosis not present

## 2021-10-17 DIAGNOSIS — I890 Lymphedema, not elsewhere classified: Secondary | ICD-10-CM | POA: Diagnosis not present

## 2021-10-20 DIAGNOSIS — L97522 Non-pressure chronic ulcer of other part of left foot with fat layer exposed: Secondary | ICD-10-CM | POA: Diagnosis not present

## 2021-10-20 DIAGNOSIS — L97322 Non-pressure chronic ulcer of left ankle with fat layer exposed: Secondary | ICD-10-CM | POA: Diagnosis not present

## 2021-10-20 DIAGNOSIS — L97812 Non-pressure chronic ulcer of other part of right lower leg with fat layer exposed: Secondary | ICD-10-CM | POA: Diagnosis not present

## 2021-10-20 DIAGNOSIS — Z4800 Encounter for change or removal of nonsurgical wound dressing: Secondary | ICD-10-CM | POA: Diagnosis not present

## 2021-10-20 DIAGNOSIS — I872 Venous insufficiency (chronic) (peripheral): Secondary | ICD-10-CM | POA: Diagnosis not present

## 2021-10-20 DIAGNOSIS — I890 Lymphedema, not elsewhere classified: Secondary | ICD-10-CM | POA: Diagnosis not present

## 2021-10-24 DIAGNOSIS — L97522 Non-pressure chronic ulcer of other part of left foot with fat layer exposed: Secondary | ICD-10-CM | POA: Diagnosis not present

## 2021-10-24 DIAGNOSIS — L97322 Non-pressure chronic ulcer of left ankle with fat layer exposed: Secondary | ICD-10-CM | POA: Diagnosis not present

## 2021-10-24 DIAGNOSIS — L97812 Non-pressure chronic ulcer of other part of right lower leg with fat layer exposed: Secondary | ICD-10-CM | POA: Diagnosis not present

## 2021-10-24 DIAGNOSIS — Z4800 Encounter for change or removal of nonsurgical wound dressing: Secondary | ICD-10-CM | POA: Diagnosis not present

## 2021-10-24 DIAGNOSIS — I890 Lymphedema, not elsewhere classified: Secondary | ICD-10-CM | POA: Diagnosis not present

## 2021-10-24 DIAGNOSIS — I872 Venous insufficiency (chronic) (peripheral): Secondary | ICD-10-CM | POA: Diagnosis not present

## 2021-10-26 DIAGNOSIS — I10 Essential (primary) hypertension: Secondary | ICD-10-CM | POA: Diagnosis not present

## 2021-10-26 DIAGNOSIS — L97521 Non-pressure chronic ulcer of other part of left foot limited to breakdown of skin: Secondary | ICD-10-CM | POA: Diagnosis not present

## 2021-10-26 DIAGNOSIS — G8929 Other chronic pain: Secondary | ICD-10-CM | POA: Diagnosis not present

## 2021-10-26 DIAGNOSIS — L97311 Non-pressure chronic ulcer of right ankle limited to breakdown of skin: Secondary | ICD-10-CM | POA: Diagnosis not present

## 2021-10-26 DIAGNOSIS — A498 Other bacterial infections of unspecified site: Secondary | ICD-10-CM | POA: Diagnosis not present

## 2021-10-26 DIAGNOSIS — I872 Venous insufficiency (chronic) (peripheral): Secondary | ICD-10-CM | POA: Diagnosis not present

## 2021-10-26 DIAGNOSIS — M25569 Pain in unspecified knee: Secondary | ICD-10-CM | POA: Diagnosis not present

## 2021-10-27 DIAGNOSIS — I872 Venous insufficiency (chronic) (peripheral): Secondary | ICD-10-CM | POA: Diagnosis not present

## 2021-10-27 DIAGNOSIS — I890 Lymphedema, not elsewhere classified: Secondary | ICD-10-CM | POA: Diagnosis not present

## 2021-10-27 DIAGNOSIS — Z4800 Encounter for change or removal of nonsurgical wound dressing: Secondary | ICD-10-CM | POA: Diagnosis not present

## 2021-10-27 DIAGNOSIS — L97322 Non-pressure chronic ulcer of left ankle with fat layer exposed: Secondary | ICD-10-CM | POA: Diagnosis not present

## 2021-10-27 DIAGNOSIS — L97522 Non-pressure chronic ulcer of other part of left foot with fat layer exposed: Secondary | ICD-10-CM | POA: Diagnosis not present

## 2021-10-27 DIAGNOSIS — L97812 Non-pressure chronic ulcer of other part of right lower leg with fat layer exposed: Secondary | ICD-10-CM | POA: Diagnosis not present

## 2021-10-28 DIAGNOSIS — I7389 Other specified peripheral vascular diseases: Secondary | ICD-10-CM | POA: Diagnosis not present

## 2021-10-28 DIAGNOSIS — M170 Bilateral primary osteoarthritis of knee: Secondary | ICD-10-CM | POA: Diagnosis not present

## 2021-10-28 DIAGNOSIS — I890 Lymphedema, not elsewhere classified: Secondary | ICD-10-CM | POA: Diagnosis not present

## 2021-10-28 DIAGNOSIS — M810 Age-related osteoporosis without current pathological fracture: Secondary | ICD-10-CM | POA: Diagnosis not present

## 2021-10-28 DIAGNOSIS — Z9181 History of falling: Secondary | ICD-10-CM | POA: Diagnosis not present

## 2021-10-28 DIAGNOSIS — Z4800 Encounter for change or removal of nonsurgical wound dressing: Secondary | ICD-10-CM | POA: Diagnosis not present

## 2021-10-28 DIAGNOSIS — L97522 Non-pressure chronic ulcer of other part of left foot with fat layer exposed: Secondary | ICD-10-CM | POA: Diagnosis not present

## 2021-10-28 DIAGNOSIS — L97812 Non-pressure chronic ulcer of other part of right lower leg with fat layer exposed: Secondary | ICD-10-CM | POA: Diagnosis not present

## 2021-10-28 DIAGNOSIS — K219 Gastro-esophageal reflux disease without esophagitis: Secondary | ICD-10-CM | POA: Diagnosis not present

## 2021-10-28 DIAGNOSIS — E785 Hyperlipidemia, unspecified: Secondary | ICD-10-CM | POA: Diagnosis not present

## 2021-10-28 DIAGNOSIS — L97322 Non-pressure chronic ulcer of left ankle with fat layer exposed: Secondary | ICD-10-CM | POA: Diagnosis not present

## 2021-10-28 DIAGNOSIS — I872 Venous insufficiency (chronic) (peripheral): Secondary | ICD-10-CM | POA: Diagnosis not present

## 2021-10-31 DIAGNOSIS — L97522 Non-pressure chronic ulcer of other part of left foot with fat layer exposed: Secondary | ICD-10-CM | POA: Diagnosis not present

## 2021-10-31 DIAGNOSIS — I872 Venous insufficiency (chronic) (peripheral): Secondary | ICD-10-CM | POA: Diagnosis not present

## 2021-10-31 DIAGNOSIS — L97322 Non-pressure chronic ulcer of left ankle with fat layer exposed: Secondary | ICD-10-CM | POA: Diagnosis not present

## 2021-10-31 DIAGNOSIS — I890 Lymphedema, not elsewhere classified: Secondary | ICD-10-CM | POA: Diagnosis not present

## 2021-10-31 DIAGNOSIS — L97812 Non-pressure chronic ulcer of other part of right lower leg with fat layer exposed: Secondary | ICD-10-CM | POA: Diagnosis not present

## 2021-10-31 DIAGNOSIS — Z4800 Encounter for change or removal of nonsurgical wound dressing: Secondary | ICD-10-CM | POA: Diagnosis not present

## 2021-11-01 DIAGNOSIS — Z131 Encounter for screening for diabetes mellitus: Secondary | ICD-10-CM | POA: Diagnosis not present

## 2021-11-01 DIAGNOSIS — I10 Essential (primary) hypertension: Secondary | ICD-10-CM | POA: Diagnosis not present

## 2021-11-01 DIAGNOSIS — M170 Bilateral primary osteoarthritis of knee: Secondary | ICD-10-CM | POA: Diagnosis not present

## 2021-11-03 DIAGNOSIS — I872 Venous insufficiency (chronic) (peripheral): Secondary | ICD-10-CM | POA: Diagnosis not present

## 2021-11-03 DIAGNOSIS — L97522 Non-pressure chronic ulcer of other part of left foot with fat layer exposed: Secondary | ICD-10-CM | POA: Diagnosis not present

## 2021-11-03 DIAGNOSIS — L97812 Non-pressure chronic ulcer of other part of right lower leg with fat layer exposed: Secondary | ICD-10-CM | POA: Diagnosis not present

## 2021-11-03 DIAGNOSIS — Z4800 Encounter for change or removal of nonsurgical wound dressing: Secondary | ICD-10-CM | POA: Diagnosis not present

## 2021-11-03 DIAGNOSIS — I890 Lymphedema, not elsewhere classified: Secondary | ICD-10-CM | POA: Diagnosis not present

## 2021-11-03 DIAGNOSIS — L97322 Non-pressure chronic ulcer of left ankle with fat layer exposed: Secondary | ICD-10-CM | POA: Diagnosis not present

## 2021-11-07 DIAGNOSIS — I890 Lymphedema, not elsewhere classified: Secondary | ICD-10-CM | POA: Diagnosis not present

## 2021-11-07 DIAGNOSIS — L97812 Non-pressure chronic ulcer of other part of right lower leg with fat layer exposed: Secondary | ICD-10-CM | POA: Diagnosis not present

## 2021-11-07 DIAGNOSIS — L97322 Non-pressure chronic ulcer of left ankle with fat layer exposed: Secondary | ICD-10-CM | POA: Diagnosis not present

## 2021-11-07 DIAGNOSIS — L97522 Non-pressure chronic ulcer of other part of left foot with fat layer exposed: Secondary | ICD-10-CM | POA: Diagnosis not present

## 2021-11-07 DIAGNOSIS — I872 Venous insufficiency (chronic) (peripheral): Secondary | ICD-10-CM | POA: Diagnosis not present

## 2021-11-07 DIAGNOSIS — Z4800 Encounter for change or removal of nonsurgical wound dressing: Secondary | ICD-10-CM | POA: Diagnosis not present

## 2021-11-10 DIAGNOSIS — L97522 Non-pressure chronic ulcer of other part of left foot with fat layer exposed: Secondary | ICD-10-CM | POA: Diagnosis not present

## 2021-11-10 DIAGNOSIS — Z4800 Encounter for change or removal of nonsurgical wound dressing: Secondary | ICD-10-CM | POA: Diagnosis not present

## 2021-11-10 DIAGNOSIS — L97812 Non-pressure chronic ulcer of other part of right lower leg with fat layer exposed: Secondary | ICD-10-CM | POA: Diagnosis not present

## 2021-11-10 DIAGNOSIS — I890 Lymphedema, not elsewhere classified: Secondary | ICD-10-CM | POA: Diagnosis not present

## 2021-11-10 DIAGNOSIS — I872 Venous insufficiency (chronic) (peripheral): Secondary | ICD-10-CM | POA: Diagnosis not present

## 2021-11-10 DIAGNOSIS — L97322 Non-pressure chronic ulcer of left ankle with fat layer exposed: Secondary | ICD-10-CM | POA: Diagnosis not present

## 2021-11-11 DIAGNOSIS — L97521 Non-pressure chronic ulcer of other part of left foot limited to breakdown of skin: Secondary | ICD-10-CM | POA: Diagnosis not present

## 2021-11-11 DIAGNOSIS — I10 Essential (primary) hypertension: Secondary | ICD-10-CM | POA: Diagnosis not present

## 2021-11-11 DIAGNOSIS — M25569 Pain in unspecified knee: Secondary | ICD-10-CM | POA: Diagnosis not present

## 2021-11-11 DIAGNOSIS — L97929 Non-pressure chronic ulcer of unspecified part of left lower leg with unspecified severity: Secondary | ICD-10-CM | POA: Diagnosis not present

## 2021-11-11 DIAGNOSIS — I83228 Varicose veins of left lower extremity with both ulcer of other part of lower extremity and inflammation: Secondary | ICD-10-CM | POA: Diagnosis not present

## 2021-11-11 DIAGNOSIS — Z79899 Other long term (current) drug therapy: Secondary | ICD-10-CM | POA: Diagnosis not present

## 2021-11-11 DIAGNOSIS — I83019 Varicose veins of right lower extremity with ulcer of unspecified site: Secondary | ICD-10-CM | POA: Diagnosis not present

## 2021-11-11 DIAGNOSIS — G8929 Other chronic pain: Secondary | ICD-10-CM | POA: Diagnosis not present

## 2021-11-11 DIAGNOSIS — L97819 Non-pressure chronic ulcer of other part of right lower leg with unspecified severity: Secondary | ICD-10-CM | POA: Diagnosis not present

## 2021-11-11 DIAGNOSIS — L97829 Non-pressure chronic ulcer of other part of left lower leg with unspecified severity: Secondary | ICD-10-CM | POA: Diagnosis not present

## 2021-11-11 DIAGNOSIS — L97919 Non-pressure chronic ulcer of unspecified part of right lower leg with unspecified severity: Secondary | ICD-10-CM | POA: Diagnosis not present

## 2021-11-11 DIAGNOSIS — I83218 Varicose veins of right lower extremity with both ulcer of other part of lower extremity and inflammation: Secondary | ICD-10-CM | POA: Diagnosis not present

## 2021-11-14 DIAGNOSIS — I890 Lymphedema, not elsewhere classified: Secondary | ICD-10-CM | POA: Diagnosis not present

## 2021-11-14 DIAGNOSIS — L97322 Non-pressure chronic ulcer of left ankle with fat layer exposed: Secondary | ICD-10-CM | POA: Diagnosis not present

## 2021-11-14 DIAGNOSIS — Z4800 Encounter for change or removal of nonsurgical wound dressing: Secondary | ICD-10-CM | POA: Diagnosis not present

## 2021-11-14 DIAGNOSIS — I872 Venous insufficiency (chronic) (peripheral): Secondary | ICD-10-CM | POA: Diagnosis not present

## 2021-11-14 DIAGNOSIS — L97522 Non-pressure chronic ulcer of other part of left foot with fat layer exposed: Secondary | ICD-10-CM | POA: Diagnosis not present

## 2021-11-14 DIAGNOSIS — L97812 Non-pressure chronic ulcer of other part of right lower leg with fat layer exposed: Secondary | ICD-10-CM | POA: Diagnosis not present

## 2021-11-17 DIAGNOSIS — L97322 Non-pressure chronic ulcer of left ankle with fat layer exposed: Secondary | ICD-10-CM | POA: Diagnosis not present

## 2021-11-17 DIAGNOSIS — L97812 Non-pressure chronic ulcer of other part of right lower leg with fat layer exposed: Secondary | ICD-10-CM | POA: Diagnosis not present

## 2021-11-17 DIAGNOSIS — I872 Venous insufficiency (chronic) (peripheral): Secondary | ICD-10-CM | POA: Diagnosis not present

## 2021-11-17 DIAGNOSIS — L97522 Non-pressure chronic ulcer of other part of left foot with fat layer exposed: Secondary | ICD-10-CM | POA: Diagnosis not present

## 2021-11-17 DIAGNOSIS — Z4800 Encounter for change or removal of nonsurgical wound dressing: Secondary | ICD-10-CM | POA: Diagnosis not present

## 2021-11-17 DIAGNOSIS — I890 Lymphedema, not elsewhere classified: Secondary | ICD-10-CM | POA: Diagnosis not present

## 2021-11-21 DIAGNOSIS — I890 Lymphedema, not elsewhere classified: Secondary | ICD-10-CM | POA: Diagnosis not present

## 2021-11-21 DIAGNOSIS — I872 Venous insufficiency (chronic) (peripheral): Secondary | ICD-10-CM | POA: Diagnosis not present

## 2021-11-21 DIAGNOSIS — L97322 Non-pressure chronic ulcer of left ankle with fat layer exposed: Secondary | ICD-10-CM | POA: Diagnosis not present

## 2021-11-21 DIAGNOSIS — L97812 Non-pressure chronic ulcer of other part of right lower leg with fat layer exposed: Secondary | ICD-10-CM | POA: Diagnosis not present

## 2021-11-21 DIAGNOSIS — L97522 Non-pressure chronic ulcer of other part of left foot with fat layer exposed: Secondary | ICD-10-CM | POA: Diagnosis not present

## 2021-11-21 DIAGNOSIS — Z4800 Encounter for change or removal of nonsurgical wound dressing: Secondary | ICD-10-CM | POA: Diagnosis not present

## 2021-11-24 DIAGNOSIS — I872 Venous insufficiency (chronic) (peripheral): Secondary | ICD-10-CM | POA: Diagnosis not present

## 2021-11-24 DIAGNOSIS — L97812 Non-pressure chronic ulcer of other part of right lower leg with fat layer exposed: Secondary | ICD-10-CM | POA: Diagnosis not present

## 2021-11-24 DIAGNOSIS — L97522 Non-pressure chronic ulcer of other part of left foot with fat layer exposed: Secondary | ICD-10-CM | POA: Diagnosis not present

## 2021-11-24 DIAGNOSIS — L97322 Non-pressure chronic ulcer of left ankle with fat layer exposed: Secondary | ICD-10-CM | POA: Diagnosis not present

## 2021-11-24 DIAGNOSIS — I890 Lymphedema, not elsewhere classified: Secondary | ICD-10-CM | POA: Diagnosis not present

## 2021-11-24 DIAGNOSIS — Z4800 Encounter for change or removal of nonsurgical wound dressing: Secondary | ICD-10-CM | POA: Diagnosis not present

## 2021-11-27 DIAGNOSIS — M170 Bilateral primary osteoarthritis of knee: Secondary | ICD-10-CM | POA: Diagnosis not present

## 2021-11-27 DIAGNOSIS — Z9181 History of falling: Secondary | ICD-10-CM | POA: Diagnosis not present

## 2021-11-27 DIAGNOSIS — Z4800 Encounter for change or removal of nonsurgical wound dressing: Secondary | ICD-10-CM | POA: Diagnosis not present

## 2021-11-27 DIAGNOSIS — I872 Venous insufficiency (chronic) (peripheral): Secondary | ICD-10-CM | POA: Diagnosis not present

## 2021-11-27 DIAGNOSIS — I890 Lymphedema, not elsewhere classified: Secondary | ICD-10-CM | POA: Diagnosis not present

## 2021-11-27 DIAGNOSIS — L97522 Non-pressure chronic ulcer of other part of left foot with fat layer exposed: Secondary | ICD-10-CM | POA: Diagnosis not present

## 2021-11-27 DIAGNOSIS — E785 Hyperlipidemia, unspecified: Secondary | ICD-10-CM | POA: Diagnosis not present

## 2021-11-27 DIAGNOSIS — L97322 Non-pressure chronic ulcer of left ankle with fat layer exposed: Secondary | ICD-10-CM | POA: Diagnosis not present

## 2021-11-27 DIAGNOSIS — L97812 Non-pressure chronic ulcer of other part of right lower leg with fat layer exposed: Secondary | ICD-10-CM | POA: Diagnosis not present

## 2021-11-27 DIAGNOSIS — K219 Gastro-esophageal reflux disease without esophagitis: Secondary | ICD-10-CM | POA: Diagnosis not present

## 2021-11-27 DIAGNOSIS — I7389 Other specified peripheral vascular diseases: Secondary | ICD-10-CM | POA: Diagnosis not present

## 2021-11-27 DIAGNOSIS — M810 Age-related osteoporosis without current pathological fracture: Secondary | ICD-10-CM | POA: Diagnosis not present

## 2021-11-28 DIAGNOSIS — Z4800 Encounter for change or removal of nonsurgical wound dressing: Secondary | ICD-10-CM | POA: Diagnosis not present

## 2021-11-28 DIAGNOSIS — L97522 Non-pressure chronic ulcer of other part of left foot with fat layer exposed: Secondary | ICD-10-CM | POA: Diagnosis not present

## 2021-11-28 DIAGNOSIS — I872 Venous insufficiency (chronic) (peripheral): Secondary | ICD-10-CM | POA: Diagnosis not present

## 2021-11-28 DIAGNOSIS — L97322 Non-pressure chronic ulcer of left ankle with fat layer exposed: Secondary | ICD-10-CM | POA: Diagnosis not present

## 2021-11-28 DIAGNOSIS — I890 Lymphedema, not elsewhere classified: Secondary | ICD-10-CM | POA: Diagnosis not present

## 2021-11-28 DIAGNOSIS — L97812 Non-pressure chronic ulcer of other part of right lower leg with fat layer exposed: Secondary | ICD-10-CM | POA: Diagnosis not present

## 2021-12-01 DIAGNOSIS — Z4800 Encounter for change or removal of nonsurgical wound dressing: Secondary | ICD-10-CM | POA: Diagnosis not present

## 2021-12-01 DIAGNOSIS — L97522 Non-pressure chronic ulcer of other part of left foot with fat layer exposed: Secondary | ICD-10-CM | POA: Diagnosis not present

## 2021-12-01 DIAGNOSIS — I872 Venous insufficiency (chronic) (peripheral): Secondary | ICD-10-CM | POA: Diagnosis not present

## 2021-12-01 DIAGNOSIS — L97812 Non-pressure chronic ulcer of other part of right lower leg with fat layer exposed: Secondary | ICD-10-CM | POA: Diagnosis not present

## 2021-12-01 DIAGNOSIS — L97322 Non-pressure chronic ulcer of left ankle with fat layer exposed: Secondary | ICD-10-CM | POA: Diagnosis not present

## 2021-12-01 DIAGNOSIS — I890 Lymphedema, not elsewhere classified: Secondary | ICD-10-CM | POA: Diagnosis not present

## 2021-12-05 DIAGNOSIS — L97812 Non-pressure chronic ulcer of other part of right lower leg with fat layer exposed: Secondary | ICD-10-CM | POA: Diagnosis not present

## 2021-12-05 DIAGNOSIS — Z4800 Encounter for change or removal of nonsurgical wound dressing: Secondary | ICD-10-CM | POA: Diagnosis not present

## 2021-12-05 DIAGNOSIS — L97322 Non-pressure chronic ulcer of left ankle with fat layer exposed: Secondary | ICD-10-CM | POA: Diagnosis not present

## 2021-12-05 DIAGNOSIS — L97522 Non-pressure chronic ulcer of other part of left foot with fat layer exposed: Secondary | ICD-10-CM | POA: Diagnosis not present

## 2021-12-05 DIAGNOSIS — I890 Lymphedema, not elsewhere classified: Secondary | ICD-10-CM | POA: Diagnosis not present

## 2021-12-05 DIAGNOSIS — I872 Venous insufficiency (chronic) (peripheral): Secondary | ICD-10-CM | POA: Diagnosis not present

## 2021-12-08 DIAGNOSIS — I872 Venous insufficiency (chronic) (peripheral): Secondary | ICD-10-CM | POA: Diagnosis not present

## 2021-12-08 DIAGNOSIS — L97522 Non-pressure chronic ulcer of other part of left foot with fat layer exposed: Secondary | ICD-10-CM | POA: Diagnosis not present

## 2021-12-08 DIAGNOSIS — Z4800 Encounter for change or removal of nonsurgical wound dressing: Secondary | ICD-10-CM | POA: Diagnosis not present

## 2021-12-08 DIAGNOSIS — I890 Lymphedema, not elsewhere classified: Secondary | ICD-10-CM | POA: Diagnosis not present

## 2021-12-08 DIAGNOSIS — L97322 Non-pressure chronic ulcer of left ankle with fat layer exposed: Secondary | ICD-10-CM | POA: Diagnosis not present

## 2021-12-08 DIAGNOSIS — Z23 Encounter for immunization: Secondary | ICD-10-CM | POA: Diagnosis not present

## 2021-12-08 DIAGNOSIS — L97812 Non-pressure chronic ulcer of other part of right lower leg with fat layer exposed: Secondary | ICD-10-CM | POA: Diagnosis not present

## 2021-12-12 DIAGNOSIS — L97322 Non-pressure chronic ulcer of left ankle with fat layer exposed: Secondary | ICD-10-CM | POA: Diagnosis not present

## 2021-12-12 DIAGNOSIS — Z4800 Encounter for change or removal of nonsurgical wound dressing: Secondary | ICD-10-CM | POA: Diagnosis not present

## 2021-12-12 DIAGNOSIS — L97522 Non-pressure chronic ulcer of other part of left foot with fat layer exposed: Secondary | ICD-10-CM | POA: Diagnosis not present

## 2021-12-12 DIAGNOSIS — I890 Lymphedema, not elsewhere classified: Secondary | ICD-10-CM | POA: Diagnosis not present

## 2021-12-12 DIAGNOSIS — I872 Venous insufficiency (chronic) (peripheral): Secondary | ICD-10-CM | POA: Diagnosis not present

## 2021-12-12 DIAGNOSIS — L97812 Non-pressure chronic ulcer of other part of right lower leg with fat layer exposed: Secondary | ICD-10-CM | POA: Diagnosis not present

## 2021-12-15 DIAGNOSIS — L97322 Non-pressure chronic ulcer of left ankle with fat layer exposed: Secondary | ICD-10-CM | POA: Diagnosis not present

## 2021-12-15 DIAGNOSIS — L97812 Non-pressure chronic ulcer of other part of right lower leg with fat layer exposed: Secondary | ICD-10-CM | POA: Diagnosis not present

## 2021-12-15 DIAGNOSIS — L97522 Non-pressure chronic ulcer of other part of left foot with fat layer exposed: Secondary | ICD-10-CM | POA: Diagnosis not present

## 2021-12-15 DIAGNOSIS — I872 Venous insufficiency (chronic) (peripheral): Secondary | ICD-10-CM | POA: Diagnosis not present

## 2021-12-15 DIAGNOSIS — I890 Lymphedema, not elsewhere classified: Secondary | ICD-10-CM | POA: Diagnosis not present

## 2021-12-15 DIAGNOSIS — Z4800 Encounter for change or removal of nonsurgical wound dressing: Secondary | ICD-10-CM | POA: Diagnosis not present

## 2021-12-19 DIAGNOSIS — I872 Venous insufficiency (chronic) (peripheral): Secondary | ICD-10-CM | POA: Diagnosis not present

## 2021-12-19 DIAGNOSIS — L97812 Non-pressure chronic ulcer of other part of right lower leg with fat layer exposed: Secondary | ICD-10-CM | POA: Diagnosis not present

## 2021-12-19 DIAGNOSIS — Z4800 Encounter for change or removal of nonsurgical wound dressing: Secondary | ICD-10-CM | POA: Diagnosis not present

## 2021-12-19 DIAGNOSIS — I890 Lymphedema, not elsewhere classified: Secondary | ICD-10-CM | POA: Diagnosis not present

## 2021-12-19 DIAGNOSIS — L97322 Non-pressure chronic ulcer of left ankle with fat layer exposed: Secondary | ICD-10-CM | POA: Diagnosis not present

## 2021-12-19 DIAGNOSIS — L97522 Non-pressure chronic ulcer of other part of left foot with fat layer exposed: Secondary | ICD-10-CM | POA: Diagnosis not present

## 2021-12-20 DIAGNOSIS — L84 Corns and callosities: Secondary | ICD-10-CM | POA: Diagnosis not present

## 2021-12-20 DIAGNOSIS — M79674 Pain in right toe(s): Secondary | ICD-10-CM | POA: Diagnosis not present

## 2021-12-20 DIAGNOSIS — M79675 Pain in left toe(s): Secondary | ICD-10-CM | POA: Diagnosis not present

## 2021-12-20 DIAGNOSIS — L600 Ingrowing nail: Secondary | ICD-10-CM | POA: Diagnosis not present

## 2021-12-20 DIAGNOSIS — B351 Tinea unguium: Secondary | ICD-10-CM | POA: Diagnosis not present

## 2021-12-20 DIAGNOSIS — M7742 Metatarsalgia, left foot: Secondary | ICD-10-CM | POA: Diagnosis not present

## 2021-12-20 DIAGNOSIS — I739 Peripheral vascular disease, unspecified: Secondary | ICD-10-CM | POA: Diagnosis not present

## 2021-12-20 DIAGNOSIS — M7741 Metatarsalgia, right foot: Secondary | ICD-10-CM | POA: Diagnosis not present

## 2021-12-22 DIAGNOSIS — L97812 Non-pressure chronic ulcer of other part of right lower leg with fat layer exposed: Secondary | ICD-10-CM | POA: Diagnosis not present

## 2021-12-22 DIAGNOSIS — I890 Lymphedema, not elsewhere classified: Secondary | ICD-10-CM | POA: Diagnosis not present

## 2021-12-22 DIAGNOSIS — I872 Venous insufficiency (chronic) (peripheral): Secondary | ICD-10-CM | POA: Diagnosis not present

## 2021-12-22 DIAGNOSIS — Z4800 Encounter for change or removal of nonsurgical wound dressing: Secondary | ICD-10-CM | POA: Diagnosis not present

## 2021-12-22 DIAGNOSIS — L97322 Non-pressure chronic ulcer of left ankle with fat layer exposed: Secondary | ICD-10-CM | POA: Diagnosis not present

## 2021-12-22 DIAGNOSIS — L97522 Non-pressure chronic ulcer of other part of left foot with fat layer exposed: Secondary | ICD-10-CM | POA: Diagnosis not present

## 2021-12-26 DIAGNOSIS — I872 Venous insufficiency (chronic) (peripheral): Secondary | ICD-10-CM | POA: Diagnosis not present

## 2021-12-26 DIAGNOSIS — L97812 Non-pressure chronic ulcer of other part of right lower leg with fat layer exposed: Secondary | ICD-10-CM | POA: Diagnosis not present

## 2021-12-26 DIAGNOSIS — Z4800 Encounter for change or removal of nonsurgical wound dressing: Secondary | ICD-10-CM | POA: Diagnosis not present

## 2021-12-26 DIAGNOSIS — I890 Lymphedema, not elsewhere classified: Secondary | ICD-10-CM | POA: Diagnosis not present

## 2021-12-26 DIAGNOSIS — L97522 Non-pressure chronic ulcer of other part of left foot with fat layer exposed: Secondary | ICD-10-CM | POA: Diagnosis not present

## 2021-12-26 DIAGNOSIS — L97322 Non-pressure chronic ulcer of left ankle with fat layer exposed: Secondary | ICD-10-CM | POA: Diagnosis not present

## 2021-12-27 DIAGNOSIS — M170 Bilateral primary osteoarthritis of knee: Secondary | ICD-10-CM | POA: Diagnosis not present

## 2021-12-27 DIAGNOSIS — K219 Gastro-esophageal reflux disease without esophagitis: Secondary | ICD-10-CM | POA: Diagnosis not present

## 2021-12-27 DIAGNOSIS — I7389 Other specified peripheral vascular diseases: Secondary | ICD-10-CM | POA: Diagnosis not present

## 2021-12-27 DIAGNOSIS — L97522 Non-pressure chronic ulcer of other part of left foot with fat layer exposed: Secondary | ICD-10-CM | POA: Diagnosis not present

## 2021-12-27 DIAGNOSIS — I890 Lymphedema, not elsewhere classified: Secondary | ICD-10-CM | POA: Diagnosis not present

## 2021-12-27 DIAGNOSIS — I872 Venous insufficiency (chronic) (peripheral): Secondary | ICD-10-CM | POA: Diagnosis not present

## 2021-12-27 DIAGNOSIS — L84 Corns and callosities: Secondary | ICD-10-CM | POA: Diagnosis not present

## 2021-12-27 DIAGNOSIS — L97812 Non-pressure chronic ulcer of other part of right lower leg with fat layer exposed: Secondary | ICD-10-CM | POA: Diagnosis not present

## 2021-12-27 DIAGNOSIS — Z9181 History of falling: Secondary | ICD-10-CM | POA: Diagnosis not present

## 2021-12-27 DIAGNOSIS — E785 Hyperlipidemia, unspecified: Secondary | ICD-10-CM | POA: Diagnosis not present

## 2021-12-27 DIAGNOSIS — M810 Age-related osteoporosis without current pathological fracture: Secondary | ICD-10-CM | POA: Diagnosis not present

## 2021-12-27 DIAGNOSIS — L97322 Non-pressure chronic ulcer of left ankle with fat layer exposed: Secondary | ICD-10-CM | POA: Diagnosis not present

## 2021-12-29 DIAGNOSIS — L97322 Non-pressure chronic ulcer of left ankle with fat layer exposed: Secondary | ICD-10-CM | POA: Diagnosis not present

## 2021-12-29 DIAGNOSIS — I890 Lymphedema, not elsewhere classified: Secondary | ICD-10-CM | POA: Diagnosis not present

## 2021-12-29 DIAGNOSIS — L97812 Non-pressure chronic ulcer of other part of right lower leg with fat layer exposed: Secondary | ICD-10-CM | POA: Diagnosis not present

## 2021-12-29 DIAGNOSIS — I872 Venous insufficiency (chronic) (peripheral): Secondary | ICD-10-CM | POA: Diagnosis not present

## 2021-12-29 DIAGNOSIS — L97522 Non-pressure chronic ulcer of other part of left foot with fat layer exposed: Secondary | ICD-10-CM | POA: Diagnosis not present

## 2021-12-29 DIAGNOSIS — L84 Corns and callosities: Secondary | ICD-10-CM | POA: Diagnosis not present

## 2022-01-02 DIAGNOSIS — L97322 Non-pressure chronic ulcer of left ankle with fat layer exposed: Secondary | ICD-10-CM | POA: Diagnosis not present

## 2022-01-02 DIAGNOSIS — I872 Venous insufficiency (chronic) (peripheral): Secondary | ICD-10-CM | POA: Diagnosis not present

## 2022-01-02 DIAGNOSIS — L84 Corns and callosities: Secondary | ICD-10-CM | POA: Diagnosis not present

## 2022-01-02 DIAGNOSIS — L97522 Non-pressure chronic ulcer of other part of left foot with fat layer exposed: Secondary | ICD-10-CM | POA: Diagnosis not present

## 2022-01-02 DIAGNOSIS — I890 Lymphedema, not elsewhere classified: Secondary | ICD-10-CM | POA: Diagnosis not present

## 2022-01-02 DIAGNOSIS — L97812 Non-pressure chronic ulcer of other part of right lower leg with fat layer exposed: Secondary | ICD-10-CM | POA: Diagnosis not present

## 2022-01-05 DIAGNOSIS — L97322 Non-pressure chronic ulcer of left ankle with fat layer exposed: Secondary | ICD-10-CM | POA: Diagnosis not present

## 2022-01-05 DIAGNOSIS — L97522 Non-pressure chronic ulcer of other part of left foot with fat layer exposed: Secondary | ICD-10-CM | POA: Diagnosis not present

## 2022-01-05 DIAGNOSIS — L97812 Non-pressure chronic ulcer of other part of right lower leg with fat layer exposed: Secondary | ICD-10-CM | POA: Diagnosis not present

## 2022-01-05 DIAGNOSIS — I890 Lymphedema, not elsewhere classified: Secondary | ICD-10-CM | POA: Diagnosis not present

## 2022-01-05 DIAGNOSIS — L84 Corns and callosities: Secondary | ICD-10-CM | POA: Diagnosis not present

## 2022-01-05 DIAGNOSIS — I872 Venous insufficiency (chronic) (peripheral): Secondary | ICD-10-CM | POA: Diagnosis not present

## 2022-01-09 DIAGNOSIS — L97322 Non-pressure chronic ulcer of left ankle with fat layer exposed: Secondary | ICD-10-CM | POA: Diagnosis not present

## 2022-01-09 DIAGNOSIS — I890 Lymphedema, not elsewhere classified: Secondary | ICD-10-CM | POA: Diagnosis not present

## 2022-01-09 DIAGNOSIS — L97812 Non-pressure chronic ulcer of other part of right lower leg with fat layer exposed: Secondary | ICD-10-CM | POA: Diagnosis not present

## 2022-01-09 DIAGNOSIS — L84 Corns and callosities: Secondary | ICD-10-CM | POA: Diagnosis not present

## 2022-01-09 DIAGNOSIS — L97522 Non-pressure chronic ulcer of other part of left foot with fat layer exposed: Secondary | ICD-10-CM | POA: Diagnosis not present

## 2022-01-09 DIAGNOSIS — I872 Venous insufficiency (chronic) (peripheral): Secondary | ICD-10-CM | POA: Diagnosis not present

## 2022-01-12 DIAGNOSIS — I872 Venous insufficiency (chronic) (peripheral): Secondary | ICD-10-CM | POA: Diagnosis not present

## 2022-01-12 DIAGNOSIS — L97322 Non-pressure chronic ulcer of left ankle with fat layer exposed: Secondary | ICD-10-CM | POA: Diagnosis not present

## 2022-01-12 DIAGNOSIS — L97812 Non-pressure chronic ulcer of other part of right lower leg with fat layer exposed: Secondary | ICD-10-CM | POA: Diagnosis not present

## 2022-01-12 DIAGNOSIS — L97522 Non-pressure chronic ulcer of other part of left foot with fat layer exposed: Secondary | ICD-10-CM | POA: Diagnosis not present

## 2022-01-12 DIAGNOSIS — I890 Lymphedema, not elsewhere classified: Secondary | ICD-10-CM | POA: Diagnosis not present

## 2022-01-12 DIAGNOSIS — L84 Corns and callosities: Secondary | ICD-10-CM | POA: Diagnosis not present

## 2022-01-17 DIAGNOSIS — L97322 Non-pressure chronic ulcer of left ankle with fat layer exposed: Secondary | ICD-10-CM | POA: Diagnosis not present

## 2022-01-17 DIAGNOSIS — I890 Lymphedema, not elsewhere classified: Secondary | ICD-10-CM | POA: Diagnosis not present

## 2022-01-17 DIAGNOSIS — L97522 Non-pressure chronic ulcer of other part of left foot with fat layer exposed: Secondary | ICD-10-CM | POA: Diagnosis not present

## 2022-01-17 DIAGNOSIS — L97812 Non-pressure chronic ulcer of other part of right lower leg with fat layer exposed: Secondary | ICD-10-CM | POA: Diagnosis not present

## 2022-01-17 DIAGNOSIS — I872 Venous insufficiency (chronic) (peripheral): Secondary | ICD-10-CM | POA: Diagnosis not present

## 2022-01-17 DIAGNOSIS — L84 Corns and callosities: Secondary | ICD-10-CM | POA: Diagnosis not present

## 2022-01-20 DIAGNOSIS — I872 Venous insufficiency (chronic) (peripheral): Secondary | ICD-10-CM | POA: Diagnosis not present

## 2022-01-20 DIAGNOSIS — L97522 Non-pressure chronic ulcer of other part of left foot with fat layer exposed: Secondary | ICD-10-CM | POA: Diagnosis not present

## 2022-01-20 DIAGNOSIS — I890 Lymphedema, not elsewhere classified: Secondary | ICD-10-CM | POA: Diagnosis not present

## 2022-01-20 DIAGNOSIS — L97812 Non-pressure chronic ulcer of other part of right lower leg with fat layer exposed: Secondary | ICD-10-CM | POA: Diagnosis not present

## 2022-01-20 DIAGNOSIS — L97322 Non-pressure chronic ulcer of left ankle with fat layer exposed: Secondary | ICD-10-CM | POA: Diagnosis not present

## 2022-01-20 DIAGNOSIS — L84 Corns and callosities: Secondary | ICD-10-CM | POA: Diagnosis not present

## 2022-01-24 DIAGNOSIS — L97812 Non-pressure chronic ulcer of other part of right lower leg with fat layer exposed: Secondary | ICD-10-CM | POA: Diagnosis not present

## 2022-01-24 DIAGNOSIS — L84 Corns and callosities: Secondary | ICD-10-CM | POA: Diagnosis not present

## 2022-01-24 DIAGNOSIS — I872 Venous insufficiency (chronic) (peripheral): Secondary | ICD-10-CM | POA: Diagnosis not present

## 2022-01-24 DIAGNOSIS — I890 Lymphedema, not elsewhere classified: Secondary | ICD-10-CM | POA: Diagnosis not present

## 2022-01-24 DIAGNOSIS — L97322 Non-pressure chronic ulcer of left ankle with fat layer exposed: Secondary | ICD-10-CM | POA: Diagnosis not present

## 2022-01-24 DIAGNOSIS — L97522 Non-pressure chronic ulcer of other part of left foot with fat layer exposed: Secondary | ICD-10-CM | POA: Diagnosis not present

## 2022-01-26 DIAGNOSIS — M170 Bilateral primary osteoarthritis of knee: Secondary | ICD-10-CM | POA: Diagnosis not present

## 2022-01-26 DIAGNOSIS — L97322 Non-pressure chronic ulcer of left ankle with fat layer exposed: Secondary | ICD-10-CM | POA: Diagnosis not present

## 2022-01-26 DIAGNOSIS — I890 Lymphedema, not elsewhere classified: Secondary | ICD-10-CM | POA: Diagnosis not present

## 2022-01-26 DIAGNOSIS — I872 Venous insufficiency (chronic) (peripheral): Secondary | ICD-10-CM | POA: Diagnosis not present

## 2022-01-26 DIAGNOSIS — L84 Corns and callosities: Secondary | ICD-10-CM | POA: Diagnosis not present

## 2022-01-26 DIAGNOSIS — K219 Gastro-esophageal reflux disease without esophagitis: Secondary | ICD-10-CM | POA: Diagnosis not present

## 2022-01-26 DIAGNOSIS — M810 Age-related osteoporosis without current pathological fracture: Secondary | ICD-10-CM | POA: Diagnosis not present

## 2022-01-26 DIAGNOSIS — L97522 Non-pressure chronic ulcer of other part of left foot with fat layer exposed: Secondary | ICD-10-CM | POA: Diagnosis not present

## 2022-01-26 DIAGNOSIS — E785 Hyperlipidemia, unspecified: Secondary | ICD-10-CM | POA: Diagnosis not present

## 2022-01-26 DIAGNOSIS — I7389 Other specified peripheral vascular diseases: Secondary | ICD-10-CM | POA: Diagnosis not present

## 2022-01-26 DIAGNOSIS — L97812 Non-pressure chronic ulcer of other part of right lower leg with fat layer exposed: Secondary | ICD-10-CM | POA: Diagnosis not present

## 2022-01-26 DIAGNOSIS — Z9181 History of falling: Secondary | ICD-10-CM | POA: Diagnosis not present

## 2022-01-30 DIAGNOSIS — L97522 Non-pressure chronic ulcer of other part of left foot with fat layer exposed: Secondary | ICD-10-CM | POA: Diagnosis not present

## 2022-01-30 DIAGNOSIS — I890 Lymphedema, not elsewhere classified: Secondary | ICD-10-CM | POA: Diagnosis not present

## 2022-01-30 DIAGNOSIS — L97812 Non-pressure chronic ulcer of other part of right lower leg with fat layer exposed: Secondary | ICD-10-CM | POA: Diagnosis not present

## 2022-01-30 DIAGNOSIS — I872 Venous insufficiency (chronic) (peripheral): Secondary | ICD-10-CM | POA: Diagnosis not present

## 2022-01-30 DIAGNOSIS — L97322 Non-pressure chronic ulcer of left ankle with fat layer exposed: Secondary | ICD-10-CM | POA: Diagnosis not present

## 2022-01-30 DIAGNOSIS — L84 Corns and callosities: Secondary | ICD-10-CM | POA: Diagnosis not present

## 2022-02-01 DIAGNOSIS — L97322 Non-pressure chronic ulcer of left ankle with fat layer exposed: Secondary | ICD-10-CM | POA: Diagnosis not present

## 2022-02-01 DIAGNOSIS — L84 Corns and callosities: Secondary | ICD-10-CM | POA: Diagnosis not present

## 2022-02-01 DIAGNOSIS — I872 Venous insufficiency (chronic) (peripheral): Secondary | ICD-10-CM | POA: Diagnosis not present

## 2022-02-01 DIAGNOSIS — I890 Lymphedema, not elsewhere classified: Secondary | ICD-10-CM | POA: Diagnosis not present

## 2022-02-01 DIAGNOSIS — L97812 Non-pressure chronic ulcer of other part of right lower leg with fat layer exposed: Secondary | ICD-10-CM | POA: Diagnosis not present

## 2022-02-01 DIAGNOSIS — L97522 Non-pressure chronic ulcer of other part of left foot with fat layer exposed: Secondary | ICD-10-CM | POA: Diagnosis not present

## 2022-02-03 DIAGNOSIS — L84 Corns and callosities: Secondary | ICD-10-CM | POA: Diagnosis not present

## 2022-02-03 DIAGNOSIS — I872 Venous insufficiency (chronic) (peripheral): Secondary | ICD-10-CM | POA: Diagnosis not present

## 2022-02-03 DIAGNOSIS — I890 Lymphedema, not elsewhere classified: Secondary | ICD-10-CM | POA: Diagnosis not present

## 2022-02-03 DIAGNOSIS — L97812 Non-pressure chronic ulcer of other part of right lower leg with fat layer exposed: Secondary | ICD-10-CM | POA: Diagnosis not present

## 2022-02-03 DIAGNOSIS — L97522 Non-pressure chronic ulcer of other part of left foot with fat layer exposed: Secondary | ICD-10-CM | POA: Diagnosis not present

## 2022-02-03 DIAGNOSIS — L97322 Non-pressure chronic ulcer of left ankle with fat layer exposed: Secondary | ICD-10-CM | POA: Diagnosis not present

## 2022-02-06 DIAGNOSIS — L97322 Non-pressure chronic ulcer of left ankle with fat layer exposed: Secondary | ICD-10-CM | POA: Diagnosis not present

## 2022-02-06 DIAGNOSIS — L84 Corns and callosities: Secondary | ICD-10-CM | POA: Diagnosis not present

## 2022-02-06 DIAGNOSIS — L97812 Non-pressure chronic ulcer of other part of right lower leg with fat layer exposed: Secondary | ICD-10-CM | POA: Diagnosis not present

## 2022-02-06 DIAGNOSIS — I890 Lymphedema, not elsewhere classified: Secondary | ICD-10-CM | POA: Diagnosis not present

## 2022-02-06 DIAGNOSIS — L97522 Non-pressure chronic ulcer of other part of left foot with fat layer exposed: Secondary | ICD-10-CM | POA: Diagnosis not present

## 2022-02-06 DIAGNOSIS — I872 Venous insufficiency (chronic) (peripheral): Secondary | ICD-10-CM | POA: Diagnosis not present

## 2022-02-08 DIAGNOSIS — L84 Corns and callosities: Secondary | ICD-10-CM | POA: Diagnosis not present

## 2022-02-08 DIAGNOSIS — I872 Venous insufficiency (chronic) (peripheral): Secondary | ICD-10-CM | POA: Diagnosis not present

## 2022-02-08 DIAGNOSIS — L97322 Non-pressure chronic ulcer of left ankle with fat layer exposed: Secondary | ICD-10-CM | POA: Diagnosis not present

## 2022-02-08 DIAGNOSIS — L97522 Non-pressure chronic ulcer of other part of left foot with fat layer exposed: Secondary | ICD-10-CM | POA: Diagnosis not present

## 2022-02-08 DIAGNOSIS — I890 Lymphedema, not elsewhere classified: Secondary | ICD-10-CM | POA: Diagnosis not present

## 2022-02-08 DIAGNOSIS — L97812 Non-pressure chronic ulcer of other part of right lower leg with fat layer exposed: Secondary | ICD-10-CM | POA: Diagnosis not present

## 2022-02-10 DIAGNOSIS — L97522 Non-pressure chronic ulcer of other part of left foot with fat layer exposed: Secondary | ICD-10-CM | POA: Diagnosis not present

## 2022-02-10 DIAGNOSIS — L97322 Non-pressure chronic ulcer of left ankle with fat layer exposed: Secondary | ICD-10-CM | POA: Diagnosis not present

## 2022-02-10 DIAGNOSIS — L84 Corns and callosities: Secondary | ICD-10-CM | POA: Diagnosis not present

## 2022-02-10 DIAGNOSIS — I872 Venous insufficiency (chronic) (peripheral): Secondary | ICD-10-CM | POA: Diagnosis not present

## 2022-02-10 DIAGNOSIS — L97812 Non-pressure chronic ulcer of other part of right lower leg with fat layer exposed: Secondary | ICD-10-CM | POA: Diagnosis not present

## 2022-02-10 DIAGNOSIS — I890 Lymphedema, not elsewhere classified: Secondary | ICD-10-CM | POA: Diagnosis not present

## 2022-02-13 DIAGNOSIS — L97322 Non-pressure chronic ulcer of left ankle with fat layer exposed: Secondary | ICD-10-CM | POA: Diagnosis not present

## 2022-02-13 DIAGNOSIS — I872 Venous insufficiency (chronic) (peripheral): Secondary | ICD-10-CM | POA: Diagnosis not present

## 2022-02-13 DIAGNOSIS — L84 Corns and callosities: Secondary | ICD-10-CM | POA: Diagnosis not present

## 2022-02-13 DIAGNOSIS — I890 Lymphedema, not elsewhere classified: Secondary | ICD-10-CM | POA: Diagnosis not present

## 2022-02-13 DIAGNOSIS — L97812 Non-pressure chronic ulcer of other part of right lower leg with fat layer exposed: Secondary | ICD-10-CM | POA: Diagnosis not present

## 2022-02-13 DIAGNOSIS — L97522 Non-pressure chronic ulcer of other part of left foot with fat layer exposed: Secondary | ICD-10-CM | POA: Diagnosis not present

## 2022-02-14 ENCOUNTER — Telehealth: Payer: Self-pay | Admitting: *Deleted

## 2022-02-14 DIAGNOSIS — Z0000 Encounter for general adult medical examination without abnormal findings: Secondary | ICD-10-CM | POA: Diagnosis not present

## 2022-02-14 DIAGNOSIS — M170 Bilateral primary osteoarthritis of knee: Secondary | ICD-10-CM | POA: Diagnosis not present

## 2022-02-14 DIAGNOSIS — I10 Essential (primary) hypertension: Secondary | ICD-10-CM | POA: Diagnosis not present

## 2022-02-14 DIAGNOSIS — Z1331 Encounter for screening for depression: Secondary | ICD-10-CM | POA: Diagnosis not present

## 2022-02-14 NOTE — Progress Notes (Signed)
  Care Coordination   Note   02/14/2022 Name: Kayla Reynolds MRN: [id number] DOB: 06-Nov-1945  Kayla Reynolds is a 77 y.o. year old female who sees Hasanaj, Samul Dada, MD for primary care. I reached out to Milbert Coulter by phone today to offer care coordination services.  Kayla Reynolds was given information about Care Coordination services today including:   The Care Coordination services include support from the care team which includes your Nurse Coordinator, Clinical Social Worker, or Pharmacist.  The Care Coordination team is here to help remove barriers to the health concerns and goals most important to you. Care Coordination services are voluntary, and the patient may decline or stop services at any time by request to their care team member.   Care Coordination Consent Status: Patient did not agree to participate in care coordination services at this time.   Encounter Outcome:  Pt. Refused  [place]  Direct Dial: [phone]

## 2022-02-15 DIAGNOSIS — I890 Lymphedema, not elsewhere classified: Secondary | ICD-10-CM | POA: Diagnosis not present

## 2022-02-15 DIAGNOSIS — L84 Corns and callosities: Secondary | ICD-10-CM | POA: Diagnosis not present

## 2022-02-15 DIAGNOSIS — I872 Venous insufficiency (chronic) (peripheral): Secondary | ICD-10-CM | POA: Diagnosis not present

## 2022-02-15 DIAGNOSIS — L97322 Non-pressure chronic ulcer of left ankle with fat layer exposed: Secondary | ICD-10-CM | POA: Diagnosis not present

## 2022-02-15 DIAGNOSIS — L97812 Non-pressure chronic ulcer of other part of right lower leg with fat layer exposed: Secondary | ICD-10-CM | POA: Diagnosis not present

## 2022-02-15 DIAGNOSIS — L97522 Non-pressure chronic ulcer of other part of left foot with fat layer exposed: Secondary | ICD-10-CM | POA: Diagnosis not present

## 2022-02-17 DIAGNOSIS — L97322 Non-pressure chronic ulcer of left ankle with fat layer exposed: Secondary | ICD-10-CM | POA: Diagnosis not present

## 2022-02-17 DIAGNOSIS — L97812 Non-pressure chronic ulcer of other part of right lower leg with fat layer exposed: Secondary | ICD-10-CM | POA: Diagnosis not present

## 2022-02-17 DIAGNOSIS — I872 Venous insufficiency (chronic) (peripheral): Secondary | ICD-10-CM | POA: Diagnosis not present

## 2022-02-17 DIAGNOSIS — L84 Corns and callosities: Secondary | ICD-10-CM | POA: Diagnosis not present

## 2022-02-17 DIAGNOSIS — I890 Lymphedema, not elsewhere classified: Secondary | ICD-10-CM | POA: Diagnosis not present

## 2022-02-17 DIAGNOSIS — L97522 Non-pressure chronic ulcer of other part of left foot with fat layer exposed: Secondary | ICD-10-CM | POA: Diagnosis not present

## 2022-02-20 DIAGNOSIS — L84 Corns and callosities: Secondary | ICD-10-CM | POA: Diagnosis not present

## 2022-02-20 DIAGNOSIS — I872 Venous insufficiency (chronic) (peripheral): Secondary | ICD-10-CM | POA: Diagnosis not present

## 2022-02-20 DIAGNOSIS — L97522 Non-pressure chronic ulcer of other part of left foot with fat layer exposed: Secondary | ICD-10-CM | POA: Diagnosis not present

## 2022-02-20 DIAGNOSIS — L97812 Non-pressure chronic ulcer of other part of right lower leg with fat layer exposed: Secondary | ICD-10-CM | POA: Diagnosis not present

## 2022-02-20 DIAGNOSIS — L97322 Non-pressure chronic ulcer of left ankle with fat layer exposed: Secondary | ICD-10-CM | POA: Diagnosis not present

## 2022-02-20 DIAGNOSIS — I890 Lymphedema, not elsewhere classified: Secondary | ICD-10-CM | POA: Diagnosis not present

## 2022-02-22 DIAGNOSIS — L97322 Non-pressure chronic ulcer of left ankle with fat layer exposed: Secondary | ICD-10-CM | POA: Diagnosis not present

## 2022-02-22 DIAGNOSIS — L84 Corns and callosities: Secondary | ICD-10-CM | POA: Diagnosis not present

## 2022-02-22 DIAGNOSIS — I890 Lymphedema, not elsewhere classified: Secondary | ICD-10-CM | POA: Diagnosis not present

## 2022-02-22 DIAGNOSIS — I872 Venous insufficiency (chronic) (peripheral): Secondary | ICD-10-CM | POA: Diagnosis not present

## 2022-02-22 DIAGNOSIS — L97522 Non-pressure chronic ulcer of other part of left foot with fat layer exposed: Secondary | ICD-10-CM | POA: Diagnosis not present

## 2022-02-22 DIAGNOSIS — L97812 Non-pressure chronic ulcer of other part of right lower leg with fat layer exposed: Secondary | ICD-10-CM | POA: Diagnosis not present

## 2022-02-24 DIAGNOSIS — I890 Lymphedema, not elsewhere classified: Secondary | ICD-10-CM | POA: Diagnosis not present

## 2022-02-24 DIAGNOSIS — I872 Venous insufficiency (chronic) (peripheral): Secondary | ICD-10-CM | POA: Diagnosis not present

## 2022-02-24 DIAGNOSIS — L84 Corns and callosities: Secondary | ICD-10-CM | POA: Diagnosis not present

## 2022-02-24 DIAGNOSIS — L97322 Non-pressure chronic ulcer of left ankle with fat layer exposed: Secondary | ICD-10-CM | POA: Diagnosis not present

## 2022-02-24 DIAGNOSIS — L97522 Non-pressure chronic ulcer of other part of left foot with fat layer exposed: Secondary | ICD-10-CM | POA: Diagnosis not present

## 2022-02-24 DIAGNOSIS — L97812 Non-pressure chronic ulcer of other part of right lower leg with fat layer exposed: Secondary | ICD-10-CM | POA: Diagnosis not present

## 2022-02-25 DIAGNOSIS — L84 Corns and callosities: Secondary | ICD-10-CM | POA: Diagnosis not present

## 2022-02-25 DIAGNOSIS — L97322 Non-pressure chronic ulcer of left ankle with fat layer exposed: Secondary | ICD-10-CM | POA: Diagnosis not present

## 2022-02-25 DIAGNOSIS — L97812 Non-pressure chronic ulcer of other part of right lower leg with fat layer exposed: Secondary | ICD-10-CM | POA: Diagnosis not present

## 2022-02-25 DIAGNOSIS — M170 Bilateral primary osteoarthritis of knee: Secondary | ICD-10-CM | POA: Diagnosis not present

## 2022-02-25 DIAGNOSIS — L97522 Non-pressure chronic ulcer of other part of left foot with fat layer exposed: Secondary | ICD-10-CM | POA: Diagnosis not present

## 2022-02-25 DIAGNOSIS — I7389 Other specified peripheral vascular diseases: Secondary | ICD-10-CM | POA: Diagnosis not present

## 2022-02-25 DIAGNOSIS — I872 Venous insufficiency (chronic) (peripheral): Secondary | ICD-10-CM | POA: Diagnosis not present

## 2022-02-25 DIAGNOSIS — I890 Lymphedema, not elsewhere classified: Secondary | ICD-10-CM | POA: Diagnosis not present

## 2022-02-25 DIAGNOSIS — Z9181 History of falling: Secondary | ICD-10-CM | POA: Diagnosis not present

## 2022-02-25 DIAGNOSIS — K219 Gastro-esophageal reflux disease without esophagitis: Secondary | ICD-10-CM | POA: Diagnosis not present

## 2022-02-25 DIAGNOSIS — E785 Hyperlipidemia, unspecified: Secondary | ICD-10-CM | POA: Diagnosis not present

## 2022-02-25 DIAGNOSIS — M810 Age-related osteoporosis without current pathological fracture: Secondary | ICD-10-CM | POA: Diagnosis not present

## 2022-02-27 DIAGNOSIS — L97322 Non-pressure chronic ulcer of left ankle with fat layer exposed: Secondary | ICD-10-CM | POA: Diagnosis not present

## 2022-02-27 DIAGNOSIS — I872 Venous insufficiency (chronic) (peripheral): Secondary | ICD-10-CM | POA: Diagnosis not present

## 2022-02-27 DIAGNOSIS — L97812 Non-pressure chronic ulcer of other part of right lower leg with fat layer exposed: Secondary | ICD-10-CM | POA: Diagnosis not present

## 2022-02-27 DIAGNOSIS — I890 Lymphedema, not elsewhere classified: Secondary | ICD-10-CM | POA: Diagnosis not present

## 2022-02-27 DIAGNOSIS — L84 Corns and callosities: Secondary | ICD-10-CM | POA: Diagnosis not present

## 2022-02-27 DIAGNOSIS — L97522 Non-pressure chronic ulcer of other part of left foot with fat layer exposed: Secondary | ICD-10-CM | POA: Diagnosis not present

## 2022-03-01 DIAGNOSIS — G8929 Other chronic pain: Secondary | ICD-10-CM | POA: Diagnosis not present

## 2022-03-01 DIAGNOSIS — L97311 Non-pressure chronic ulcer of right ankle limited to breakdown of skin: Secondary | ICD-10-CM | POA: Diagnosis not present

## 2022-03-01 DIAGNOSIS — M25569 Pain in unspecified knee: Secondary | ICD-10-CM | POA: Diagnosis not present

## 2022-03-01 DIAGNOSIS — L905 Scar conditions and fibrosis of skin: Secondary | ICD-10-CM | POA: Diagnosis not present

## 2022-03-01 DIAGNOSIS — I872 Venous insufficiency (chronic) (peripheral): Secondary | ICD-10-CM | POA: Diagnosis not present

## 2022-03-01 DIAGNOSIS — L97521 Non-pressure chronic ulcer of other part of left foot limited to breakdown of skin: Secondary | ICD-10-CM | POA: Diagnosis not present

## 2022-03-01 DIAGNOSIS — I10 Essential (primary) hypertension: Secondary | ICD-10-CM | POA: Diagnosis not present

## 2022-03-03 DIAGNOSIS — L97322 Non-pressure chronic ulcer of left ankle with fat layer exposed: Secondary | ICD-10-CM | POA: Diagnosis not present

## 2022-03-03 DIAGNOSIS — L97812 Non-pressure chronic ulcer of other part of right lower leg with fat layer exposed: Secondary | ICD-10-CM | POA: Diagnosis not present

## 2022-03-03 DIAGNOSIS — L84 Corns and callosities: Secondary | ICD-10-CM | POA: Diagnosis not present

## 2022-03-03 DIAGNOSIS — I872 Venous insufficiency (chronic) (peripheral): Secondary | ICD-10-CM | POA: Diagnosis not present

## 2022-03-03 DIAGNOSIS — L97522 Non-pressure chronic ulcer of other part of left foot with fat layer exposed: Secondary | ICD-10-CM | POA: Diagnosis not present

## 2022-03-03 DIAGNOSIS — I890 Lymphedema, not elsewhere classified: Secondary | ICD-10-CM | POA: Diagnosis not present

## 2022-03-06 DIAGNOSIS — L84 Corns and callosities: Secondary | ICD-10-CM | POA: Diagnosis not present

## 2022-03-06 DIAGNOSIS — L97812 Non-pressure chronic ulcer of other part of right lower leg with fat layer exposed: Secondary | ICD-10-CM | POA: Diagnosis not present

## 2022-03-06 DIAGNOSIS — L97322 Non-pressure chronic ulcer of left ankle with fat layer exposed: Secondary | ICD-10-CM | POA: Diagnosis not present

## 2022-03-06 DIAGNOSIS — I872 Venous insufficiency (chronic) (peripheral): Secondary | ICD-10-CM | POA: Diagnosis not present

## 2022-03-06 DIAGNOSIS — L97522 Non-pressure chronic ulcer of other part of left foot with fat layer exposed: Secondary | ICD-10-CM | POA: Diagnosis not present

## 2022-03-06 DIAGNOSIS — I890 Lymphedema, not elsewhere classified: Secondary | ICD-10-CM | POA: Diagnosis not present

## 2022-03-08 DIAGNOSIS — I872 Venous insufficiency (chronic) (peripheral): Secondary | ICD-10-CM | POA: Diagnosis not present

## 2022-03-08 DIAGNOSIS — L97322 Non-pressure chronic ulcer of left ankle with fat layer exposed: Secondary | ICD-10-CM | POA: Diagnosis not present

## 2022-03-08 DIAGNOSIS — I890 Lymphedema, not elsewhere classified: Secondary | ICD-10-CM | POA: Diagnosis not present

## 2022-03-08 DIAGNOSIS — L84 Corns and callosities: Secondary | ICD-10-CM | POA: Diagnosis not present

## 2022-03-08 DIAGNOSIS — L97522 Non-pressure chronic ulcer of other part of left foot with fat layer exposed: Secondary | ICD-10-CM | POA: Diagnosis not present

## 2022-03-08 DIAGNOSIS — L97812 Non-pressure chronic ulcer of other part of right lower leg with fat layer exposed: Secondary | ICD-10-CM | POA: Diagnosis not present

## 2022-03-10 DIAGNOSIS — L97812 Non-pressure chronic ulcer of other part of right lower leg with fat layer exposed: Secondary | ICD-10-CM | POA: Diagnosis not present

## 2022-03-10 DIAGNOSIS — L84 Corns and callosities: Secondary | ICD-10-CM | POA: Diagnosis not present

## 2022-03-10 DIAGNOSIS — I890 Lymphedema, not elsewhere classified: Secondary | ICD-10-CM | POA: Diagnosis not present

## 2022-03-10 DIAGNOSIS — I872 Venous insufficiency (chronic) (peripheral): Secondary | ICD-10-CM | POA: Diagnosis not present

## 2022-03-10 DIAGNOSIS — L97322 Non-pressure chronic ulcer of left ankle with fat layer exposed: Secondary | ICD-10-CM | POA: Diagnosis not present

## 2022-03-10 DIAGNOSIS — L97522 Non-pressure chronic ulcer of other part of left foot with fat layer exposed: Secondary | ICD-10-CM | POA: Diagnosis not present

## 2022-03-13 DIAGNOSIS — L97812 Non-pressure chronic ulcer of other part of right lower leg with fat layer exposed: Secondary | ICD-10-CM | POA: Diagnosis not present

## 2022-03-13 DIAGNOSIS — L97522 Non-pressure chronic ulcer of other part of left foot with fat layer exposed: Secondary | ICD-10-CM | POA: Diagnosis not present

## 2022-03-13 DIAGNOSIS — I890 Lymphedema, not elsewhere classified: Secondary | ICD-10-CM | POA: Diagnosis not present

## 2022-03-13 DIAGNOSIS — L84 Corns and callosities: Secondary | ICD-10-CM | POA: Diagnosis not present

## 2022-03-13 DIAGNOSIS — I872 Venous insufficiency (chronic) (peripheral): Secondary | ICD-10-CM | POA: Diagnosis not present

## 2022-03-13 DIAGNOSIS — L97322 Non-pressure chronic ulcer of left ankle with fat layer exposed: Secondary | ICD-10-CM | POA: Diagnosis not present

## 2022-03-15 DIAGNOSIS — I890 Lymphedema, not elsewhere classified: Secondary | ICD-10-CM | POA: Diagnosis not present

## 2022-03-15 DIAGNOSIS — L97812 Non-pressure chronic ulcer of other part of right lower leg with fat layer exposed: Secondary | ICD-10-CM | POA: Diagnosis not present

## 2022-03-15 DIAGNOSIS — L97322 Non-pressure chronic ulcer of left ankle with fat layer exposed: Secondary | ICD-10-CM | POA: Diagnosis not present

## 2022-03-15 DIAGNOSIS — L84 Corns and callosities: Secondary | ICD-10-CM | POA: Diagnosis not present

## 2022-03-15 DIAGNOSIS — I872 Venous insufficiency (chronic) (peripheral): Secondary | ICD-10-CM | POA: Diagnosis not present

## 2022-03-15 DIAGNOSIS — L97522 Non-pressure chronic ulcer of other part of left foot with fat layer exposed: Secondary | ICD-10-CM | POA: Diagnosis not present

## 2022-03-17 DIAGNOSIS — L97812 Non-pressure chronic ulcer of other part of right lower leg with fat layer exposed: Secondary | ICD-10-CM | POA: Diagnosis not present

## 2022-03-17 DIAGNOSIS — L97322 Non-pressure chronic ulcer of left ankle with fat layer exposed: Secondary | ICD-10-CM | POA: Diagnosis not present

## 2022-03-17 DIAGNOSIS — I890 Lymphedema, not elsewhere classified: Secondary | ICD-10-CM | POA: Diagnosis not present

## 2022-03-17 DIAGNOSIS — L84 Corns and callosities: Secondary | ICD-10-CM | POA: Diagnosis not present

## 2022-03-17 DIAGNOSIS — L97522 Non-pressure chronic ulcer of other part of left foot with fat layer exposed: Secondary | ICD-10-CM | POA: Diagnosis not present

## 2022-03-17 DIAGNOSIS — I872 Venous insufficiency (chronic) (peripheral): Secondary | ICD-10-CM | POA: Diagnosis not present

## 2022-03-20 DIAGNOSIS — L97522 Non-pressure chronic ulcer of other part of left foot with fat layer exposed: Secondary | ICD-10-CM | POA: Diagnosis not present

## 2022-03-20 DIAGNOSIS — I890 Lymphedema, not elsewhere classified: Secondary | ICD-10-CM | POA: Diagnosis not present

## 2022-03-20 DIAGNOSIS — L84 Corns and callosities: Secondary | ICD-10-CM | POA: Diagnosis not present

## 2022-03-20 DIAGNOSIS — L97322 Non-pressure chronic ulcer of left ankle with fat layer exposed: Secondary | ICD-10-CM | POA: Diagnosis not present

## 2022-03-20 DIAGNOSIS — I872 Venous insufficiency (chronic) (peripheral): Secondary | ICD-10-CM | POA: Diagnosis not present

## 2022-03-20 DIAGNOSIS — L97812 Non-pressure chronic ulcer of other part of right lower leg with fat layer exposed: Secondary | ICD-10-CM | POA: Diagnosis not present

## 2022-03-22 DIAGNOSIS — L97812 Non-pressure chronic ulcer of other part of right lower leg with fat layer exposed: Secondary | ICD-10-CM | POA: Diagnosis not present

## 2022-03-22 DIAGNOSIS — I890 Lymphedema, not elsewhere classified: Secondary | ICD-10-CM | POA: Diagnosis not present

## 2022-03-22 DIAGNOSIS — L97322 Non-pressure chronic ulcer of left ankle with fat layer exposed: Secondary | ICD-10-CM | POA: Diagnosis not present

## 2022-03-22 DIAGNOSIS — L84 Corns and callosities: Secondary | ICD-10-CM | POA: Diagnosis not present

## 2022-03-22 DIAGNOSIS — I872 Venous insufficiency (chronic) (peripheral): Secondary | ICD-10-CM | POA: Diagnosis not present

## 2022-03-22 DIAGNOSIS — L97522 Non-pressure chronic ulcer of other part of left foot with fat layer exposed: Secondary | ICD-10-CM | POA: Diagnosis not present

## 2022-03-24 DIAGNOSIS — I872 Venous insufficiency (chronic) (peripheral): Secondary | ICD-10-CM | POA: Diagnosis not present

## 2022-03-24 DIAGNOSIS — L97812 Non-pressure chronic ulcer of other part of right lower leg with fat layer exposed: Secondary | ICD-10-CM | POA: Diagnosis not present

## 2022-03-24 DIAGNOSIS — I890 Lymphedema, not elsewhere classified: Secondary | ICD-10-CM | POA: Diagnosis not present

## 2022-03-24 DIAGNOSIS — L97522 Non-pressure chronic ulcer of other part of left foot with fat layer exposed: Secondary | ICD-10-CM | POA: Diagnosis not present

## 2022-03-24 DIAGNOSIS — L97322 Non-pressure chronic ulcer of left ankle with fat layer exposed: Secondary | ICD-10-CM | POA: Diagnosis not present

## 2022-03-24 DIAGNOSIS — L84 Corns and callosities: Secondary | ICD-10-CM | POA: Diagnosis not present

## 2022-03-27 DIAGNOSIS — E785 Hyperlipidemia, unspecified: Secondary | ICD-10-CM | POA: Diagnosis not present

## 2022-03-27 DIAGNOSIS — L97522 Non-pressure chronic ulcer of other part of left foot with fat layer exposed: Secondary | ICD-10-CM | POA: Diagnosis not present

## 2022-03-27 DIAGNOSIS — I872 Venous insufficiency (chronic) (peripheral): Secondary | ICD-10-CM | POA: Diagnosis not present

## 2022-03-27 DIAGNOSIS — K219 Gastro-esophageal reflux disease without esophagitis: Secondary | ICD-10-CM | POA: Diagnosis not present

## 2022-03-27 DIAGNOSIS — M170 Bilateral primary osteoarthritis of knee: Secondary | ICD-10-CM | POA: Diagnosis not present

## 2022-03-27 DIAGNOSIS — M810 Age-related osteoporosis without current pathological fracture: Secondary | ICD-10-CM | POA: Diagnosis not present

## 2022-03-27 DIAGNOSIS — I890 Lymphedema, not elsewhere classified: Secondary | ICD-10-CM | POA: Diagnosis not present

## 2022-03-27 DIAGNOSIS — I7389 Other specified peripheral vascular diseases: Secondary | ICD-10-CM | POA: Diagnosis not present

## 2022-03-27 DIAGNOSIS — Z9181 History of falling: Secondary | ICD-10-CM | POA: Diagnosis not present

## 2022-03-27 DIAGNOSIS — L84 Corns and callosities: Secondary | ICD-10-CM | POA: Diagnosis not present

## 2022-03-27 DIAGNOSIS — L97812 Non-pressure chronic ulcer of other part of right lower leg with fat layer exposed: Secondary | ICD-10-CM | POA: Diagnosis not present

## 2022-03-27 DIAGNOSIS — L97322 Non-pressure chronic ulcer of left ankle with fat layer exposed: Secondary | ICD-10-CM | POA: Diagnosis not present

## 2022-03-29 DIAGNOSIS — I872 Venous insufficiency (chronic) (peripheral): Secondary | ICD-10-CM | POA: Diagnosis not present

## 2022-03-29 DIAGNOSIS — L97522 Non-pressure chronic ulcer of other part of left foot with fat layer exposed: Secondary | ICD-10-CM | POA: Diagnosis not present

## 2022-03-29 DIAGNOSIS — I890 Lymphedema, not elsewhere classified: Secondary | ICD-10-CM | POA: Diagnosis not present

## 2022-03-29 DIAGNOSIS — L97812 Non-pressure chronic ulcer of other part of right lower leg with fat layer exposed: Secondary | ICD-10-CM | POA: Diagnosis not present

## 2022-03-29 DIAGNOSIS — L84 Corns and callosities: Secondary | ICD-10-CM | POA: Diagnosis not present

## 2022-03-29 DIAGNOSIS — L97322 Non-pressure chronic ulcer of left ankle with fat layer exposed: Secondary | ICD-10-CM | POA: Diagnosis not present

## 2022-03-31 DIAGNOSIS — I890 Lymphedema, not elsewhere classified: Secondary | ICD-10-CM | POA: Diagnosis not present

## 2022-03-31 DIAGNOSIS — L97812 Non-pressure chronic ulcer of other part of right lower leg with fat layer exposed: Secondary | ICD-10-CM | POA: Diagnosis not present

## 2022-03-31 DIAGNOSIS — I872 Venous insufficiency (chronic) (peripheral): Secondary | ICD-10-CM | POA: Diagnosis not present

## 2022-03-31 DIAGNOSIS — L97322 Non-pressure chronic ulcer of left ankle with fat layer exposed: Secondary | ICD-10-CM | POA: Diagnosis not present

## 2022-03-31 DIAGNOSIS — L84 Corns and callosities: Secondary | ICD-10-CM | POA: Diagnosis not present

## 2022-03-31 DIAGNOSIS — L97522 Non-pressure chronic ulcer of other part of left foot with fat layer exposed: Secondary | ICD-10-CM | POA: Diagnosis not present

## 2022-04-03 DIAGNOSIS — L97322 Non-pressure chronic ulcer of left ankle with fat layer exposed: Secondary | ICD-10-CM | POA: Diagnosis not present

## 2022-04-03 DIAGNOSIS — I872 Venous insufficiency (chronic) (peripheral): Secondary | ICD-10-CM | POA: Diagnosis not present

## 2022-04-03 DIAGNOSIS — I890 Lymphedema, not elsewhere classified: Secondary | ICD-10-CM | POA: Diagnosis not present

## 2022-04-03 DIAGNOSIS — L97522 Non-pressure chronic ulcer of other part of left foot with fat layer exposed: Secondary | ICD-10-CM | POA: Diagnosis not present

## 2022-04-03 DIAGNOSIS — L84 Corns and callosities: Secondary | ICD-10-CM | POA: Diagnosis not present

## 2022-04-03 DIAGNOSIS — L97812 Non-pressure chronic ulcer of other part of right lower leg with fat layer exposed: Secondary | ICD-10-CM | POA: Diagnosis not present

## 2022-04-04 DIAGNOSIS — I739 Peripheral vascular disease, unspecified: Secondary | ICD-10-CM | POA: Diagnosis not present

## 2022-04-04 DIAGNOSIS — M79675 Pain in left toe(s): Secondary | ICD-10-CM | POA: Diagnosis not present

## 2022-04-04 DIAGNOSIS — M79672 Pain in left foot: Secondary | ICD-10-CM | POA: Diagnosis not present

## 2022-04-04 DIAGNOSIS — M7741 Metatarsalgia, right foot: Secondary | ICD-10-CM | POA: Diagnosis not present

## 2022-04-04 DIAGNOSIS — L84 Corns and callosities: Secondary | ICD-10-CM | POA: Diagnosis not present

## 2022-04-04 DIAGNOSIS — M7742 Metatarsalgia, left foot: Secondary | ICD-10-CM | POA: Diagnosis not present

## 2022-04-04 DIAGNOSIS — M79674 Pain in right toe(s): Secondary | ICD-10-CM | POA: Diagnosis not present

## 2022-04-04 DIAGNOSIS — L600 Ingrowing nail: Secondary | ICD-10-CM | POA: Diagnosis not present

## 2022-04-05 DIAGNOSIS — I872 Venous insufficiency (chronic) (peripheral): Secondary | ICD-10-CM | POA: Diagnosis not present

## 2022-04-05 DIAGNOSIS — L97322 Non-pressure chronic ulcer of left ankle with fat layer exposed: Secondary | ICD-10-CM | POA: Diagnosis not present

## 2022-04-05 DIAGNOSIS — L97812 Non-pressure chronic ulcer of other part of right lower leg with fat layer exposed: Secondary | ICD-10-CM | POA: Diagnosis not present

## 2022-04-05 DIAGNOSIS — L97522 Non-pressure chronic ulcer of other part of left foot with fat layer exposed: Secondary | ICD-10-CM | POA: Diagnosis not present

## 2022-04-05 DIAGNOSIS — L84 Corns and callosities: Secondary | ICD-10-CM | POA: Diagnosis not present

## 2022-04-05 DIAGNOSIS — I890 Lymphedema, not elsewhere classified: Secondary | ICD-10-CM | POA: Diagnosis not present

## 2022-04-07 DIAGNOSIS — L97522 Non-pressure chronic ulcer of other part of left foot with fat layer exposed: Secondary | ICD-10-CM | POA: Diagnosis not present

## 2022-04-07 DIAGNOSIS — L84 Corns and callosities: Secondary | ICD-10-CM | POA: Diagnosis not present

## 2022-04-07 DIAGNOSIS — L97322 Non-pressure chronic ulcer of left ankle with fat layer exposed: Secondary | ICD-10-CM | POA: Diagnosis not present

## 2022-04-07 DIAGNOSIS — L97812 Non-pressure chronic ulcer of other part of right lower leg with fat layer exposed: Secondary | ICD-10-CM | POA: Diagnosis not present

## 2022-04-07 DIAGNOSIS — I890 Lymphedema, not elsewhere classified: Secondary | ICD-10-CM | POA: Diagnosis not present

## 2022-04-07 DIAGNOSIS — I872 Venous insufficiency (chronic) (peripheral): Secondary | ICD-10-CM | POA: Diagnosis not present

## 2022-04-10 DIAGNOSIS — L97322 Non-pressure chronic ulcer of left ankle with fat layer exposed: Secondary | ICD-10-CM | POA: Diagnosis not present

## 2022-04-10 DIAGNOSIS — I872 Venous insufficiency (chronic) (peripheral): Secondary | ICD-10-CM | POA: Diagnosis not present

## 2022-04-10 DIAGNOSIS — L97522 Non-pressure chronic ulcer of other part of left foot with fat layer exposed: Secondary | ICD-10-CM | POA: Diagnosis not present

## 2022-04-10 DIAGNOSIS — L84 Corns and callosities: Secondary | ICD-10-CM | POA: Diagnosis not present

## 2022-04-10 DIAGNOSIS — L97812 Non-pressure chronic ulcer of other part of right lower leg with fat layer exposed: Secondary | ICD-10-CM | POA: Diagnosis not present

## 2022-04-10 DIAGNOSIS — I890 Lymphedema, not elsewhere classified: Secondary | ICD-10-CM | POA: Diagnosis not present

## 2022-04-12 DIAGNOSIS — L97522 Non-pressure chronic ulcer of other part of left foot with fat layer exposed: Secondary | ICD-10-CM | POA: Diagnosis not present

## 2022-04-12 DIAGNOSIS — L97322 Non-pressure chronic ulcer of left ankle with fat layer exposed: Secondary | ICD-10-CM | POA: Diagnosis not present

## 2022-04-12 DIAGNOSIS — L84 Corns and callosities: Secondary | ICD-10-CM | POA: Diagnosis not present

## 2022-04-12 DIAGNOSIS — I890 Lymphedema, not elsewhere classified: Secondary | ICD-10-CM | POA: Diagnosis not present

## 2022-04-12 DIAGNOSIS — I872 Venous insufficiency (chronic) (peripheral): Secondary | ICD-10-CM | POA: Diagnosis not present

## 2022-04-12 DIAGNOSIS — L97812 Non-pressure chronic ulcer of other part of right lower leg with fat layer exposed: Secondary | ICD-10-CM | POA: Diagnosis not present

## 2022-04-14 DIAGNOSIS — L97812 Non-pressure chronic ulcer of other part of right lower leg with fat layer exposed: Secondary | ICD-10-CM | POA: Diagnosis not present

## 2022-04-14 DIAGNOSIS — L84 Corns and callosities: Secondary | ICD-10-CM | POA: Diagnosis not present

## 2022-04-14 DIAGNOSIS — I890 Lymphedema, not elsewhere classified: Secondary | ICD-10-CM | POA: Diagnosis not present

## 2022-04-14 DIAGNOSIS — L97322 Non-pressure chronic ulcer of left ankle with fat layer exposed: Secondary | ICD-10-CM | POA: Diagnosis not present

## 2022-04-14 DIAGNOSIS — L97522 Non-pressure chronic ulcer of other part of left foot with fat layer exposed: Secondary | ICD-10-CM | POA: Diagnosis not present

## 2022-04-14 DIAGNOSIS — I872 Venous insufficiency (chronic) (peripheral): Secondary | ICD-10-CM | POA: Diagnosis not present

## 2022-04-17 DIAGNOSIS — L97522 Non-pressure chronic ulcer of other part of left foot with fat layer exposed: Secondary | ICD-10-CM | POA: Diagnosis not present

## 2022-04-17 DIAGNOSIS — L84 Corns and callosities: Secondary | ICD-10-CM | POA: Diagnosis not present

## 2022-04-17 DIAGNOSIS — I872 Venous insufficiency (chronic) (peripheral): Secondary | ICD-10-CM | POA: Diagnosis not present

## 2022-04-17 DIAGNOSIS — L97812 Non-pressure chronic ulcer of other part of right lower leg with fat layer exposed: Secondary | ICD-10-CM | POA: Diagnosis not present

## 2022-04-17 DIAGNOSIS — L97322 Non-pressure chronic ulcer of left ankle with fat layer exposed: Secondary | ICD-10-CM | POA: Diagnosis not present

## 2022-04-17 DIAGNOSIS — I890 Lymphedema, not elsewhere classified: Secondary | ICD-10-CM | POA: Diagnosis not present

## 2022-04-19 DIAGNOSIS — I890 Lymphedema, not elsewhere classified: Secondary | ICD-10-CM | POA: Diagnosis not present

## 2022-04-19 DIAGNOSIS — L97329 Non-pressure chronic ulcer of left ankle with unspecified severity: Secondary | ICD-10-CM | POA: Diagnosis not present

## 2022-04-19 DIAGNOSIS — G8929 Other chronic pain: Secondary | ICD-10-CM | POA: Diagnosis not present

## 2022-04-19 DIAGNOSIS — L97819 Non-pressure chronic ulcer of other part of right lower leg with unspecified severity: Secondary | ICD-10-CM | POA: Diagnosis not present

## 2022-04-19 DIAGNOSIS — L97529 Non-pressure chronic ulcer of other part of left foot with unspecified severity: Secondary | ICD-10-CM | POA: Diagnosis not present

## 2022-04-19 DIAGNOSIS — L905 Scar conditions and fibrosis of skin: Secondary | ICD-10-CM | POA: Diagnosis not present

## 2022-04-19 DIAGNOSIS — M810 Age-related osteoporosis without current pathological fracture: Secondary | ICD-10-CM | POA: Diagnosis not present

## 2022-04-19 DIAGNOSIS — I10 Essential (primary) hypertension: Secondary | ICD-10-CM | POA: Diagnosis not present

## 2022-04-19 DIAGNOSIS — L97521 Non-pressure chronic ulcer of other part of left foot limited to breakdown of skin: Secondary | ICD-10-CM | POA: Diagnosis not present

## 2022-04-19 DIAGNOSIS — M25569 Pain in unspecified knee: Secondary | ICD-10-CM | POA: Diagnosis not present

## 2022-04-19 DIAGNOSIS — I872 Venous insufficiency (chronic) (peripheral): Secondary | ICD-10-CM | POA: Diagnosis not present

## 2022-04-21 DIAGNOSIS — L97812 Non-pressure chronic ulcer of other part of right lower leg with fat layer exposed: Secondary | ICD-10-CM | POA: Diagnosis not present

## 2022-04-21 DIAGNOSIS — L84 Corns and callosities: Secondary | ICD-10-CM | POA: Diagnosis not present

## 2022-04-21 DIAGNOSIS — L97522 Non-pressure chronic ulcer of other part of left foot with fat layer exposed: Secondary | ICD-10-CM | POA: Diagnosis not present

## 2022-04-21 DIAGNOSIS — I872 Venous insufficiency (chronic) (peripheral): Secondary | ICD-10-CM | POA: Diagnosis not present

## 2022-04-21 DIAGNOSIS — I890 Lymphedema, not elsewhere classified: Secondary | ICD-10-CM | POA: Diagnosis not present

## 2022-04-21 DIAGNOSIS — L97322 Non-pressure chronic ulcer of left ankle with fat layer exposed: Secondary | ICD-10-CM | POA: Diagnosis not present

## 2022-04-24 DIAGNOSIS — L97522 Non-pressure chronic ulcer of other part of left foot with fat layer exposed: Secondary | ICD-10-CM | POA: Diagnosis not present

## 2022-04-24 DIAGNOSIS — L84 Corns and callosities: Secondary | ICD-10-CM | POA: Diagnosis not present

## 2022-04-24 DIAGNOSIS — L97322 Non-pressure chronic ulcer of left ankle with fat layer exposed: Secondary | ICD-10-CM | POA: Diagnosis not present

## 2022-04-24 DIAGNOSIS — L97812 Non-pressure chronic ulcer of other part of right lower leg with fat layer exposed: Secondary | ICD-10-CM | POA: Diagnosis not present

## 2022-04-24 DIAGNOSIS — I872 Venous insufficiency (chronic) (peripheral): Secondary | ICD-10-CM | POA: Diagnosis not present

## 2022-04-24 DIAGNOSIS — I890 Lymphedema, not elsewhere classified: Secondary | ICD-10-CM | POA: Diagnosis not present

## 2022-04-26 DIAGNOSIS — E785 Hyperlipidemia, unspecified: Secondary | ICD-10-CM | POA: Diagnosis not present

## 2022-04-26 DIAGNOSIS — M810 Age-related osteoporosis without current pathological fracture: Secondary | ICD-10-CM | POA: Diagnosis not present

## 2022-04-26 DIAGNOSIS — Z9181 History of falling: Secondary | ICD-10-CM | POA: Diagnosis not present

## 2022-04-26 DIAGNOSIS — L97322 Non-pressure chronic ulcer of left ankle with fat layer exposed: Secondary | ICD-10-CM | POA: Diagnosis not present

## 2022-04-26 DIAGNOSIS — I7389 Other specified peripheral vascular diseases: Secondary | ICD-10-CM | POA: Diagnosis not present

## 2022-04-26 DIAGNOSIS — M170 Bilateral primary osteoarthritis of knee: Secondary | ICD-10-CM | POA: Diagnosis not present

## 2022-04-26 DIAGNOSIS — L97522 Non-pressure chronic ulcer of other part of left foot with fat layer exposed: Secondary | ICD-10-CM | POA: Diagnosis not present

## 2022-04-26 DIAGNOSIS — L97812 Non-pressure chronic ulcer of other part of right lower leg with fat layer exposed: Secondary | ICD-10-CM | POA: Diagnosis not present

## 2022-04-26 DIAGNOSIS — I872 Venous insufficiency (chronic) (peripheral): Secondary | ICD-10-CM | POA: Diagnosis not present

## 2022-04-26 DIAGNOSIS — L84 Corns and callosities: Secondary | ICD-10-CM | POA: Diagnosis not present

## 2022-04-26 DIAGNOSIS — K219 Gastro-esophageal reflux disease without esophagitis: Secondary | ICD-10-CM | POA: Diagnosis not present

## 2022-04-26 DIAGNOSIS — I890 Lymphedema, not elsewhere classified: Secondary | ICD-10-CM | POA: Diagnosis not present

## 2022-04-28 DIAGNOSIS — L97322 Non-pressure chronic ulcer of left ankle with fat layer exposed: Secondary | ICD-10-CM | POA: Diagnosis not present

## 2022-04-28 DIAGNOSIS — L84 Corns and callosities: Secondary | ICD-10-CM | POA: Diagnosis not present

## 2022-04-28 DIAGNOSIS — L97812 Non-pressure chronic ulcer of other part of right lower leg with fat layer exposed: Secondary | ICD-10-CM | POA: Diagnosis not present

## 2022-04-28 DIAGNOSIS — I872 Venous insufficiency (chronic) (peripheral): Secondary | ICD-10-CM | POA: Diagnosis not present

## 2022-04-28 DIAGNOSIS — I890 Lymphedema, not elsewhere classified: Secondary | ICD-10-CM | POA: Diagnosis not present

## 2022-04-28 DIAGNOSIS — L97522 Non-pressure chronic ulcer of other part of left foot with fat layer exposed: Secondary | ICD-10-CM | POA: Diagnosis not present

## 2022-05-01 DIAGNOSIS — L97812 Non-pressure chronic ulcer of other part of right lower leg with fat layer exposed: Secondary | ICD-10-CM | POA: Diagnosis not present

## 2022-05-01 DIAGNOSIS — L97322 Non-pressure chronic ulcer of left ankle with fat layer exposed: Secondary | ICD-10-CM | POA: Diagnosis not present

## 2022-05-01 DIAGNOSIS — I890 Lymphedema, not elsewhere classified: Secondary | ICD-10-CM | POA: Diagnosis not present

## 2022-05-01 DIAGNOSIS — L84 Corns and callosities: Secondary | ICD-10-CM | POA: Diagnosis not present

## 2022-05-01 DIAGNOSIS — L97522 Non-pressure chronic ulcer of other part of left foot with fat layer exposed: Secondary | ICD-10-CM | POA: Diagnosis not present

## 2022-05-01 DIAGNOSIS — I872 Venous insufficiency (chronic) (peripheral): Secondary | ICD-10-CM | POA: Diagnosis not present

## 2022-05-03 DIAGNOSIS — L97812 Non-pressure chronic ulcer of other part of right lower leg with fat layer exposed: Secondary | ICD-10-CM | POA: Diagnosis not present

## 2022-05-03 DIAGNOSIS — I890 Lymphedema, not elsewhere classified: Secondary | ICD-10-CM | POA: Diagnosis not present

## 2022-05-03 DIAGNOSIS — L97322 Non-pressure chronic ulcer of left ankle with fat layer exposed: Secondary | ICD-10-CM | POA: Diagnosis not present

## 2022-05-03 DIAGNOSIS — L84 Corns and callosities: Secondary | ICD-10-CM | POA: Diagnosis not present

## 2022-05-03 DIAGNOSIS — L97522 Non-pressure chronic ulcer of other part of left foot with fat layer exposed: Secondary | ICD-10-CM | POA: Diagnosis not present

## 2022-05-03 DIAGNOSIS — I872 Venous insufficiency (chronic) (peripheral): Secondary | ICD-10-CM | POA: Diagnosis not present

## 2022-05-05 DIAGNOSIS — I890 Lymphedema, not elsewhere classified: Secondary | ICD-10-CM | POA: Diagnosis not present

## 2022-05-05 DIAGNOSIS — I872 Venous insufficiency (chronic) (peripheral): Secondary | ICD-10-CM | POA: Diagnosis not present

## 2022-05-05 DIAGNOSIS — L84 Corns and callosities: Secondary | ICD-10-CM | POA: Diagnosis not present

## 2022-05-05 DIAGNOSIS — L97812 Non-pressure chronic ulcer of other part of right lower leg with fat layer exposed: Secondary | ICD-10-CM | POA: Diagnosis not present

## 2022-05-05 DIAGNOSIS — L97322 Non-pressure chronic ulcer of left ankle with fat layer exposed: Secondary | ICD-10-CM | POA: Diagnosis not present

## 2022-05-05 DIAGNOSIS — L97522 Non-pressure chronic ulcer of other part of left foot with fat layer exposed: Secondary | ICD-10-CM | POA: Diagnosis not present

## 2022-05-08 DIAGNOSIS — L97812 Non-pressure chronic ulcer of other part of right lower leg with fat layer exposed: Secondary | ICD-10-CM | POA: Diagnosis not present

## 2022-05-08 DIAGNOSIS — I890 Lymphedema, not elsewhere classified: Secondary | ICD-10-CM | POA: Diagnosis not present

## 2022-05-08 DIAGNOSIS — I872 Venous insufficiency (chronic) (peripheral): Secondary | ICD-10-CM | POA: Diagnosis not present

## 2022-05-08 DIAGNOSIS — L97322 Non-pressure chronic ulcer of left ankle with fat layer exposed: Secondary | ICD-10-CM | POA: Diagnosis not present

## 2022-05-08 DIAGNOSIS — L84 Corns and callosities: Secondary | ICD-10-CM | POA: Diagnosis not present

## 2022-05-08 DIAGNOSIS — L97522 Non-pressure chronic ulcer of other part of left foot with fat layer exposed: Secondary | ICD-10-CM | POA: Diagnosis not present

## 2022-05-10 DIAGNOSIS — L97322 Non-pressure chronic ulcer of left ankle with fat layer exposed: Secondary | ICD-10-CM | POA: Diagnosis not present

## 2022-05-10 DIAGNOSIS — L84 Corns and callosities: Secondary | ICD-10-CM | POA: Diagnosis not present

## 2022-05-10 DIAGNOSIS — L97812 Non-pressure chronic ulcer of other part of right lower leg with fat layer exposed: Secondary | ICD-10-CM | POA: Diagnosis not present

## 2022-05-10 DIAGNOSIS — I872 Venous insufficiency (chronic) (peripheral): Secondary | ICD-10-CM | POA: Diagnosis not present

## 2022-05-10 DIAGNOSIS — L97522 Non-pressure chronic ulcer of other part of left foot with fat layer exposed: Secondary | ICD-10-CM | POA: Diagnosis not present

## 2022-05-10 DIAGNOSIS — I890 Lymphedema, not elsewhere classified: Secondary | ICD-10-CM | POA: Diagnosis not present

## 2022-05-12 DIAGNOSIS — L97322 Non-pressure chronic ulcer of left ankle with fat layer exposed: Secondary | ICD-10-CM | POA: Diagnosis not present

## 2022-05-12 DIAGNOSIS — I890 Lymphedema, not elsewhere classified: Secondary | ICD-10-CM | POA: Diagnosis not present

## 2022-05-12 DIAGNOSIS — L84 Corns and callosities: Secondary | ICD-10-CM | POA: Diagnosis not present

## 2022-05-12 DIAGNOSIS — I872 Venous insufficiency (chronic) (peripheral): Secondary | ICD-10-CM | POA: Diagnosis not present

## 2022-05-12 DIAGNOSIS — L97522 Non-pressure chronic ulcer of other part of left foot with fat layer exposed: Secondary | ICD-10-CM | POA: Diagnosis not present

## 2022-05-12 DIAGNOSIS — L97812 Non-pressure chronic ulcer of other part of right lower leg with fat layer exposed: Secondary | ICD-10-CM | POA: Diagnosis not present

## 2022-05-15 DIAGNOSIS — L97812 Non-pressure chronic ulcer of other part of right lower leg with fat layer exposed: Secondary | ICD-10-CM | POA: Diagnosis not present

## 2022-05-15 DIAGNOSIS — L84 Corns and callosities: Secondary | ICD-10-CM | POA: Diagnosis not present

## 2022-05-15 DIAGNOSIS — L97322 Non-pressure chronic ulcer of left ankle with fat layer exposed: Secondary | ICD-10-CM | POA: Diagnosis not present

## 2022-05-15 DIAGNOSIS — L97522 Non-pressure chronic ulcer of other part of left foot with fat layer exposed: Secondary | ICD-10-CM | POA: Diagnosis not present

## 2022-05-15 DIAGNOSIS — I890 Lymphedema, not elsewhere classified: Secondary | ICD-10-CM | POA: Diagnosis not present

## 2022-05-15 DIAGNOSIS — I872 Venous insufficiency (chronic) (peripheral): Secondary | ICD-10-CM | POA: Diagnosis not present

## 2022-05-16 DIAGNOSIS — Z1231 Encounter for screening mammogram for malignant neoplasm of breast: Secondary | ICD-10-CM | POA: Diagnosis not present

## 2022-05-17 DIAGNOSIS — L97812 Non-pressure chronic ulcer of other part of right lower leg with fat layer exposed: Secondary | ICD-10-CM | POA: Diagnosis not present

## 2022-05-17 DIAGNOSIS — I872 Venous insufficiency (chronic) (peripheral): Secondary | ICD-10-CM | POA: Diagnosis not present

## 2022-05-17 DIAGNOSIS — L97522 Non-pressure chronic ulcer of other part of left foot with fat layer exposed: Secondary | ICD-10-CM | POA: Diagnosis not present

## 2022-05-17 DIAGNOSIS — I890 Lymphedema, not elsewhere classified: Secondary | ICD-10-CM | POA: Diagnosis not present

## 2022-05-17 DIAGNOSIS — L84 Corns and callosities: Secondary | ICD-10-CM | POA: Diagnosis not present

## 2022-05-17 DIAGNOSIS — L97322 Non-pressure chronic ulcer of left ankle with fat layer exposed: Secondary | ICD-10-CM | POA: Diagnosis not present

## 2022-05-19 DIAGNOSIS — L84 Corns and callosities: Secondary | ICD-10-CM | POA: Diagnosis not present

## 2022-05-19 DIAGNOSIS — I872 Venous insufficiency (chronic) (peripheral): Secondary | ICD-10-CM | POA: Diagnosis not present

## 2022-05-19 DIAGNOSIS — I890 Lymphedema, not elsewhere classified: Secondary | ICD-10-CM | POA: Diagnosis not present

## 2022-05-19 DIAGNOSIS — L97812 Non-pressure chronic ulcer of other part of right lower leg with fat layer exposed: Secondary | ICD-10-CM | POA: Diagnosis not present

## 2022-05-19 DIAGNOSIS — L97322 Non-pressure chronic ulcer of left ankle with fat layer exposed: Secondary | ICD-10-CM | POA: Diagnosis not present

## 2022-05-19 DIAGNOSIS — L97522 Non-pressure chronic ulcer of other part of left foot with fat layer exposed: Secondary | ICD-10-CM | POA: Diagnosis not present

## 2022-05-22 DIAGNOSIS — L97522 Non-pressure chronic ulcer of other part of left foot with fat layer exposed: Secondary | ICD-10-CM | POA: Diagnosis not present

## 2022-05-22 DIAGNOSIS — I872 Venous insufficiency (chronic) (peripheral): Secondary | ICD-10-CM | POA: Diagnosis not present

## 2022-05-22 DIAGNOSIS — L97812 Non-pressure chronic ulcer of other part of right lower leg with fat layer exposed: Secondary | ICD-10-CM | POA: Diagnosis not present

## 2022-05-22 DIAGNOSIS — I890 Lymphedema, not elsewhere classified: Secondary | ICD-10-CM | POA: Diagnosis not present

## 2022-05-22 DIAGNOSIS — L97322 Non-pressure chronic ulcer of left ankle with fat layer exposed: Secondary | ICD-10-CM | POA: Diagnosis not present

## 2022-05-22 DIAGNOSIS — L84 Corns and callosities: Secondary | ICD-10-CM | POA: Diagnosis not present

## 2022-05-24 DIAGNOSIS — L97521 Non-pressure chronic ulcer of other part of left foot limited to breakdown of skin: Secondary | ICD-10-CM | POA: Diagnosis not present

## 2022-05-24 DIAGNOSIS — L97311 Non-pressure chronic ulcer of right ankle limited to breakdown of skin: Secondary | ICD-10-CM | POA: Diagnosis not present

## 2022-05-24 DIAGNOSIS — I872 Venous insufficiency (chronic) (peripheral): Secondary | ICD-10-CM | POA: Diagnosis not present

## 2022-05-24 DIAGNOSIS — I890 Lymphedema, not elsewhere classified: Secondary | ICD-10-CM | POA: Diagnosis not present

## 2022-05-26 DIAGNOSIS — E785 Hyperlipidemia, unspecified: Secondary | ICD-10-CM | POA: Diagnosis not present

## 2022-05-26 DIAGNOSIS — L97522 Non-pressure chronic ulcer of other part of left foot with fat layer exposed: Secondary | ICD-10-CM | POA: Diagnosis not present

## 2022-05-26 DIAGNOSIS — I872 Venous insufficiency (chronic) (peripheral): Secondary | ICD-10-CM | POA: Diagnosis not present

## 2022-05-26 DIAGNOSIS — Z9181 History of falling: Secondary | ICD-10-CM | POA: Diagnosis not present

## 2022-05-26 DIAGNOSIS — L97812 Non-pressure chronic ulcer of other part of right lower leg with fat layer exposed: Secondary | ICD-10-CM | POA: Diagnosis not present

## 2022-05-26 DIAGNOSIS — I7389 Other specified peripheral vascular diseases: Secondary | ICD-10-CM | POA: Diagnosis not present

## 2022-05-26 DIAGNOSIS — M170 Bilateral primary osteoarthritis of knee: Secondary | ICD-10-CM | POA: Diagnosis not present

## 2022-05-26 DIAGNOSIS — L97322 Non-pressure chronic ulcer of left ankle with fat layer exposed: Secondary | ICD-10-CM | POA: Diagnosis not present

## 2022-05-26 DIAGNOSIS — I890 Lymphedema, not elsewhere classified: Secondary | ICD-10-CM | POA: Diagnosis not present

## 2022-05-26 DIAGNOSIS — K219 Gastro-esophageal reflux disease without esophagitis: Secondary | ICD-10-CM | POA: Diagnosis not present

## 2022-05-26 DIAGNOSIS — L84 Corns and callosities: Secondary | ICD-10-CM | POA: Diagnosis not present

## 2022-05-26 DIAGNOSIS — M810 Age-related osteoporosis without current pathological fracture: Secondary | ICD-10-CM | POA: Diagnosis not present

## 2022-05-29 DIAGNOSIS — I872 Venous insufficiency (chronic) (peripheral): Secondary | ICD-10-CM | POA: Diagnosis not present

## 2022-05-29 DIAGNOSIS — I890 Lymphedema, not elsewhere classified: Secondary | ICD-10-CM | POA: Diagnosis not present

## 2022-05-29 DIAGNOSIS — L97522 Non-pressure chronic ulcer of other part of left foot with fat layer exposed: Secondary | ICD-10-CM | POA: Diagnosis not present

## 2022-05-29 DIAGNOSIS — L97322 Non-pressure chronic ulcer of left ankle with fat layer exposed: Secondary | ICD-10-CM | POA: Diagnosis not present

## 2022-05-29 DIAGNOSIS — L84 Corns and callosities: Secondary | ICD-10-CM | POA: Diagnosis not present

## 2022-05-29 DIAGNOSIS — L97812 Non-pressure chronic ulcer of other part of right lower leg with fat layer exposed: Secondary | ICD-10-CM | POA: Diagnosis not present

## 2022-05-31 DIAGNOSIS — M170 Bilateral primary osteoarthritis of knee: Secondary | ICD-10-CM | POA: Diagnosis not present

## 2022-05-31 DIAGNOSIS — I10 Essential (primary) hypertension: Secondary | ICD-10-CM | POA: Diagnosis not present

## 2022-05-31 DIAGNOSIS — Z1331 Encounter for screening for depression: Secondary | ICD-10-CM | POA: Diagnosis not present

## 2022-05-31 DIAGNOSIS — Z0000 Encounter for general adult medical examination without abnormal findings: Secondary | ICD-10-CM | POA: Diagnosis not present

## 2022-06-01 DIAGNOSIS — I890 Lymphedema, not elsewhere classified: Secondary | ICD-10-CM | POA: Diagnosis not present

## 2022-06-01 DIAGNOSIS — L97812 Non-pressure chronic ulcer of other part of right lower leg with fat layer exposed: Secondary | ICD-10-CM | POA: Diagnosis not present

## 2022-06-01 DIAGNOSIS — L97322 Non-pressure chronic ulcer of left ankle with fat layer exposed: Secondary | ICD-10-CM | POA: Diagnosis not present

## 2022-06-01 DIAGNOSIS — L84 Corns and callosities: Secondary | ICD-10-CM | POA: Diagnosis not present

## 2022-06-01 DIAGNOSIS — L97522 Non-pressure chronic ulcer of other part of left foot with fat layer exposed: Secondary | ICD-10-CM | POA: Diagnosis not present

## 2022-06-01 DIAGNOSIS — I872 Venous insufficiency (chronic) (peripheral): Secondary | ICD-10-CM | POA: Diagnosis not present

## 2022-06-05 DIAGNOSIS — L97322 Non-pressure chronic ulcer of left ankle with fat layer exposed: Secondary | ICD-10-CM | POA: Diagnosis not present

## 2022-06-05 DIAGNOSIS — I872 Venous insufficiency (chronic) (peripheral): Secondary | ICD-10-CM | POA: Diagnosis not present

## 2022-06-05 DIAGNOSIS — L97522 Non-pressure chronic ulcer of other part of left foot with fat layer exposed: Secondary | ICD-10-CM | POA: Diagnosis not present

## 2022-06-05 DIAGNOSIS — L97812 Non-pressure chronic ulcer of other part of right lower leg with fat layer exposed: Secondary | ICD-10-CM | POA: Diagnosis not present

## 2022-06-05 DIAGNOSIS — L84 Corns and callosities: Secondary | ICD-10-CM | POA: Diagnosis not present

## 2022-06-05 DIAGNOSIS — I890 Lymphedema, not elsewhere classified: Secondary | ICD-10-CM | POA: Diagnosis not present

## 2022-06-08 DIAGNOSIS — I872 Venous insufficiency (chronic) (peripheral): Secondary | ICD-10-CM | POA: Diagnosis not present

## 2022-06-08 DIAGNOSIS — L84 Corns and callosities: Secondary | ICD-10-CM | POA: Diagnosis not present

## 2022-06-08 DIAGNOSIS — L97522 Non-pressure chronic ulcer of other part of left foot with fat layer exposed: Secondary | ICD-10-CM | POA: Diagnosis not present

## 2022-06-08 DIAGNOSIS — I890 Lymphedema, not elsewhere classified: Secondary | ICD-10-CM | POA: Diagnosis not present

## 2022-06-08 DIAGNOSIS — L97322 Non-pressure chronic ulcer of left ankle with fat layer exposed: Secondary | ICD-10-CM | POA: Diagnosis not present

## 2022-06-08 DIAGNOSIS — L97812 Non-pressure chronic ulcer of other part of right lower leg with fat layer exposed: Secondary | ICD-10-CM | POA: Diagnosis not present

## 2022-06-12 DIAGNOSIS — I872 Venous insufficiency (chronic) (peripheral): Secondary | ICD-10-CM | POA: Diagnosis not present

## 2022-06-12 DIAGNOSIS — I890 Lymphedema, not elsewhere classified: Secondary | ICD-10-CM | POA: Diagnosis not present

## 2022-06-12 DIAGNOSIS — L97812 Non-pressure chronic ulcer of other part of right lower leg with fat layer exposed: Secondary | ICD-10-CM | POA: Diagnosis not present

## 2022-06-12 DIAGNOSIS — L97322 Non-pressure chronic ulcer of left ankle with fat layer exposed: Secondary | ICD-10-CM | POA: Diagnosis not present

## 2022-06-12 DIAGNOSIS — L97522 Non-pressure chronic ulcer of other part of left foot with fat layer exposed: Secondary | ICD-10-CM | POA: Diagnosis not present

## 2022-06-12 DIAGNOSIS — L84 Corns and callosities: Secondary | ICD-10-CM | POA: Diagnosis not present

## 2022-06-15 DIAGNOSIS — L97522 Non-pressure chronic ulcer of other part of left foot with fat layer exposed: Secondary | ICD-10-CM | POA: Diagnosis not present

## 2022-06-15 DIAGNOSIS — L97812 Non-pressure chronic ulcer of other part of right lower leg with fat layer exposed: Secondary | ICD-10-CM | POA: Diagnosis not present

## 2022-06-15 DIAGNOSIS — L97322 Non-pressure chronic ulcer of left ankle with fat layer exposed: Secondary | ICD-10-CM | POA: Diagnosis not present

## 2022-06-15 DIAGNOSIS — I890 Lymphedema, not elsewhere classified: Secondary | ICD-10-CM | POA: Diagnosis not present

## 2022-06-15 DIAGNOSIS — I872 Venous insufficiency (chronic) (peripheral): Secondary | ICD-10-CM | POA: Diagnosis not present

## 2022-06-15 DIAGNOSIS — L84 Corns and callosities: Secondary | ICD-10-CM | POA: Diagnosis not present

## 2022-06-19 DIAGNOSIS — L97812 Non-pressure chronic ulcer of other part of right lower leg with fat layer exposed: Secondary | ICD-10-CM | POA: Diagnosis not present

## 2022-06-19 DIAGNOSIS — I872 Venous insufficiency (chronic) (peripheral): Secondary | ICD-10-CM | POA: Diagnosis not present

## 2022-06-19 DIAGNOSIS — L97522 Non-pressure chronic ulcer of other part of left foot with fat layer exposed: Secondary | ICD-10-CM | POA: Diagnosis not present

## 2022-06-19 DIAGNOSIS — I890 Lymphedema, not elsewhere classified: Secondary | ICD-10-CM | POA: Diagnosis not present

## 2022-06-19 DIAGNOSIS — L97322 Non-pressure chronic ulcer of left ankle with fat layer exposed: Secondary | ICD-10-CM | POA: Diagnosis not present

## 2022-06-19 DIAGNOSIS — L84 Corns and callosities: Secondary | ICD-10-CM | POA: Diagnosis not present

## 2022-06-22 DIAGNOSIS — L97522 Non-pressure chronic ulcer of other part of left foot with fat layer exposed: Secondary | ICD-10-CM | POA: Diagnosis not present

## 2022-06-22 DIAGNOSIS — L84 Corns and callosities: Secondary | ICD-10-CM | POA: Diagnosis not present

## 2022-06-22 DIAGNOSIS — I872 Venous insufficiency (chronic) (peripheral): Secondary | ICD-10-CM | POA: Diagnosis not present

## 2022-06-22 DIAGNOSIS — L97812 Non-pressure chronic ulcer of other part of right lower leg with fat layer exposed: Secondary | ICD-10-CM | POA: Diagnosis not present

## 2022-06-22 DIAGNOSIS — L97322 Non-pressure chronic ulcer of left ankle with fat layer exposed: Secondary | ICD-10-CM | POA: Diagnosis not present

## 2022-06-22 DIAGNOSIS — I890 Lymphedema, not elsewhere classified: Secondary | ICD-10-CM | POA: Diagnosis not present

## 2022-06-25 DIAGNOSIS — L84 Corns and callosities: Secondary | ICD-10-CM | POA: Diagnosis not present

## 2022-06-25 DIAGNOSIS — E785 Hyperlipidemia, unspecified: Secondary | ICD-10-CM | POA: Diagnosis not present

## 2022-06-25 DIAGNOSIS — I872 Venous insufficiency (chronic) (peripheral): Secondary | ICD-10-CM | POA: Diagnosis not present

## 2022-06-25 DIAGNOSIS — M810 Age-related osteoporosis without current pathological fracture: Secondary | ICD-10-CM | POA: Diagnosis not present

## 2022-06-25 DIAGNOSIS — L97522 Non-pressure chronic ulcer of other part of left foot with fat layer exposed: Secondary | ICD-10-CM | POA: Diagnosis not present

## 2022-06-25 DIAGNOSIS — L97812 Non-pressure chronic ulcer of other part of right lower leg with fat layer exposed: Secondary | ICD-10-CM | POA: Diagnosis not present

## 2022-06-25 DIAGNOSIS — I890 Lymphedema, not elsewhere classified: Secondary | ICD-10-CM | POA: Diagnosis not present

## 2022-06-25 DIAGNOSIS — M170 Bilateral primary osteoarthritis of knee: Secondary | ICD-10-CM | POA: Diagnosis not present

## 2022-06-25 DIAGNOSIS — L97322 Non-pressure chronic ulcer of left ankle with fat layer exposed: Secondary | ICD-10-CM | POA: Diagnosis not present

## 2022-06-25 DIAGNOSIS — I7389 Other specified peripheral vascular diseases: Secondary | ICD-10-CM | POA: Diagnosis not present

## 2022-06-25 DIAGNOSIS — Z9181 History of falling: Secondary | ICD-10-CM | POA: Diagnosis not present

## 2022-06-25 DIAGNOSIS — K219 Gastro-esophageal reflux disease without esophagitis: Secondary | ICD-10-CM | POA: Diagnosis not present

## 2022-06-27 DIAGNOSIS — L97522 Non-pressure chronic ulcer of other part of left foot with fat layer exposed: Secondary | ICD-10-CM | POA: Diagnosis not present

## 2022-06-27 DIAGNOSIS — L84 Corns and callosities: Secondary | ICD-10-CM | POA: Diagnosis not present

## 2022-06-27 DIAGNOSIS — L97322 Non-pressure chronic ulcer of left ankle with fat layer exposed: Secondary | ICD-10-CM | POA: Diagnosis not present

## 2022-06-27 DIAGNOSIS — I890 Lymphedema, not elsewhere classified: Secondary | ICD-10-CM | POA: Diagnosis not present

## 2022-06-27 DIAGNOSIS — L97812 Non-pressure chronic ulcer of other part of right lower leg with fat layer exposed: Secondary | ICD-10-CM | POA: Diagnosis not present

## 2022-06-27 DIAGNOSIS — I872 Venous insufficiency (chronic) (peripheral): Secondary | ICD-10-CM | POA: Diagnosis not present

## 2022-06-30 DIAGNOSIS — L97522 Non-pressure chronic ulcer of other part of left foot with fat layer exposed: Secondary | ICD-10-CM | POA: Diagnosis not present

## 2022-06-30 DIAGNOSIS — I872 Venous insufficiency (chronic) (peripheral): Secondary | ICD-10-CM | POA: Diagnosis not present

## 2022-06-30 DIAGNOSIS — L97322 Non-pressure chronic ulcer of left ankle with fat layer exposed: Secondary | ICD-10-CM | POA: Diagnosis not present

## 2022-06-30 DIAGNOSIS — L97812 Non-pressure chronic ulcer of other part of right lower leg with fat layer exposed: Secondary | ICD-10-CM | POA: Diagnosis not present

## 2022-06-30 DIAGNOSIS — I890 Lymphedema, not elsewhere classified: Secondary | ICD-10-CM | POA: Diagnosis not present

## 2022-06-30 DIAGNOSIS — L84 Corns and callosities: Secondary | ICD-10-CM | POA: Diagnosis not present

## 2022-07-03 DIAGNOSIS — I872 Venous insufficiency (chronic) (peripheral): Secondary | ICD-10-CM | POA: Diagnosis not present

## 2022-07-03 DIAGNOSIS — L97812 Non-pressure chronic ulcer of other part of right lower leg with fat layer exposed: Secondary | ICD-10-CM | POA: Diagnosis not present

## 2022-07-03 DIAGNOSIS — L97322 Non-pressure chronic ulcer of left ankle with fat layer exposed: Secondary | ICD-10-CM | POA: Diagnosis not present

## 2022-07-03 DIAGNOSIS — I890 Lymphedema, not elsewhere classified: Secondary | ICD-10-CM | POA: Diagnosis not present

## 2022-07-03 DIAGNOSIS — L97522 Non-pressure chronic ulcer of other part of left foot with fat layer exposed: Secondary | ICD-10-CM | POA: Diagnosis not present

## 2022-07-03 DIAGNOSIS — L84 Corns and callosities: Secondary | ICD-10-CM | POA: Diagnosis not present

## 2022-07-04 DIAGNOSIS — M7742 Metatarsalgia, left foot: Secondary | ICD-10-CM | POA: Diagnosis not present

## 2022-07-04 DIAGNOSIS — M2042 Other hammer toe(s) (acquired), left foot: Secondary | ICD-10-CM | POA: Diagnosis not present

## 2022-07-04 DIAGNOSIS — M7741 Metatarsalgia, right foot: Secondary | ICD-10-CM | POA: Diagnosis not present

## 2022-07-04 DIAGNOSIS — B351 Tinea unguium: Secondary | ICD-10-CM | POA: Diagnosis not present

## 2022-07-04 DIAGNOSIS — L84 Corns and callosities: Secondary | ICD-10-CM | POA: Diagnosis not present

## 2022-07-04 DIAGNOSIS — M2041 Other hammer toe(s) (acquired), right foot: Secondary | ICD-10-CM | POA: Diagnosis not present

## 2022-07-04 DIAGNOSIS — I739 Peripheral vascular disease, unspecified: Secondary | ICD-10-CM | POA: Diagnosis not present

## 2022-07-04 DIAGNOSIS — L600 Ingrowing nail: Secondary | ICD-10-CM | POA: Diagnosis not present

## 2022-07-05 DIAGNOSIS — M810 Age-related osteoporosis without current pathological fracture: Secondary | ICD-10-CM | POA: Diagnosis not present

## 2022-07-06 DIAGNOSIS — I890 Lymphedema, not elsewhere classified: Secondary | ICD-10-CM | POA: Diagnosis not present

## 2022-07-06 DIAGNOSIS — L97322 Non-pressure chronic ulcer of left ankle with fat layer exposed: Secondary | ICD-10-CM | POA: Diagnosis not present

## 2022-07-06 DIAGNOSIS — I872 Venous insufficiency (chronic) (peripheral): Secondary | ICD-10-CM | POA: Diagnosis not present

## 2022-07-06 DIAGNOSIS — L84 Corns and callosities: Secondary | ICD-10-CM | POA: Diagnosis not present

## 2022-07-06 DIAGNOSIS — L97522 Non-pressure chronic ulcer of other part of left foot with fat layer exposed: Secondary | ICD-10-CM | POA: Diagnosis not present

## 2022-07-06 DIAGNOSIS — L97812 Non-pressure chronic ulcer of other part of right lower leg with fat layer exposed: Secondary | ICD-10-CM | POA: Diagnosis not present

## 2022-07-10 DIAGNOSIS — L84 Corns and callosities: Secondary | ICD-10-CM | POA: Diagnosis not present

## 2022-07-10 DIAGNOSIS — I872 Venous insufficiency (chronic) (peripheral): Secondary | ICD-10-CM | POA: Diagnosis not present

## 2022-07-10 DIAGNOSIS — L97322 Non-pressure chronic ulcer of left ankle with fat layer exposed: Secondary | ICD-10-CM | POA: Diagnosis not present

## 2022-07-10 DIAGNOSIS — L97522 Non-pressure chronic ulcer of other part of left foot with fat layer exposed: Secondary | ICD-10-CM | POA: Diagnosis not present

## 2022-07-10 DIAGNOSIS — L97812 Non-pressure chronic ulcer of other part of right lower leg with fat layer exposed: Secondary | ICD-10-CM | POA: Diagnosis not present

## 2022-07-10 DIAGNOSIS — I890 Lymphedema, not elsewhere classified: Secondary | ICD-10-CM | POA: Diagnosis not present

## 2022-07-13 DIAGNOSIS — L97522 Non-pressure chronic ulcer of other part of left foot with fat layer exposed: Secondary | ICD-10-CM | POA: Diagnosis not present

## 2022-07-13 DIAGNOSIS — I890 Lymphedema, not elsewhere classified: Secondary | ICD-10-CM | POA: Diagnosis not present

## 2022-07-13 DIAGNOSIS — L97322 Non-pressure chronic ulcer of left ankle with fat layer exposed: Secondary | ICD-10-CM | POA: Diagnosis not present

## 2022-07-13 DIAGNOSIS — L97812 Non-pressure chronic ulcer of other part of right lower leg with fat layer exposed: Secondary | ICD-10-CM | POA: Diagnosis not present

## 2022-07-13 DIAGNOSIS — L84 Corns and callosities: Secondary | ICD-10-CM | POA: Diagnosis not present

## 2022-07-13 DIAGNOSIS — I872 Venous insufficiency (chronic) (peripheral): Secondary | ICD-10-CM | POA: Diagnosis not present

## 2022-07-17 DIAGNOSIS — L97812 Non-pressure chronic ulcer of other part of right lower leg with fat layer exposed: Secondary | ICD-10-CM | POA: Diagnosis not present

## 2022-07-17 DIAGNOSIS — L84 Corns and callosities: Secondary | ICD-10-CM | POA: Diagnosis not present

## 2022-07-17 DIAGNOSIS — L97522 Non-pressure chronic ulcer of other part of left foot with fat layer exposed: Secondary | ICD-10-CM | POA: Diagnosis not present

## 2022-07-17 DIAGNOSIS — I890 Lymphedema, not elsewhere classified: Secondary | ICD-10-CM | POA: Diagnosis not present

## 2022-07-17 DIAGNOSIS — I872 Venous insufficiency (chronic) (peripheral): Secondary | ICD-10-CM | POA: Diagnosis not present

## 2022-07-17 DIAGNOSIS — L97322 Non-pressure chronic ulcer of left ankle with fat layer exposed: Secondary | ICD-10-CM | POA: Diagnosis not present

## 2022-07-19 DIAGNOSIS — G8929 Other chronic pain: Secondary | ICD-10-CM | POA: Diagnosis not present

## 2022-07-19 DIAGNOSIS — M25569 Pain in unspecified knee: Secondary | ICD-10-CM | POA: Diagnosis not present

## 2022-07-19 DIAGNOSIS — M810 Age-related osteoporosis without current pathological fracture: Secondary | ICD-10-CM | POA: Diagnosis not present

## 2022-07-19 DIAGNOSIS — L97311 Non-pressure chronic ulcer of right ankle limited to breakdown of skin: Secondary | ICD-10-CM | POA: Diagnosis not present

## 2022-07-19 DIAGNOSIS — I890 Lymphedema, not elsewhere classified: Secondary | ICD-10-CM | POA: Diagnosis not present

## 2022-07-19 DIAGNOSIS — I10 Essential (primary) hypertension: Secondary | ICD-10-CM | POA: Diagnosis not present

## 2022-07-19 DIAGNOSIS — I872 Venous insufficiency (chronic) (peripheral): Secondary | ICD-10-CM | POA: Diagnosis not present

## 2022-07-19 DIAGNOSIS — L97521 Non-pressure chronic ulcer of other part of left foot limited to breakdown of skin: Secondary | ICD-10-CM | POA: Diagnosis not present

## 2022-07-21 DIAGNOSIS — L97329 Non-pressure chronic ulcer of left ankle with unspecified severity: Secondary | ICD-10-CM | POA: Diagnosis not present

## 2022-07-21 DIAGNOSIS — L97311 Non-pressure chronic ulcer of right ankle limited to breakdown of skin: Secondary | ICD-10-CM | POA: Diagnosis not present

## 2022-07-21 DIAGNOSIS — L97529 Non-pressure chronic ulcer of other part of left foot with unspecified severity: Secondary | ICD-10-CM | POA: Diagnosis not present

## 2022-07-21 DIAGNOSIS — M25572 Pain in left ankle and joints of left foot: Secondary | ICD-10-CM | POA: Diagnosis not present

## 2022-07-21 DIAGNOSIS — L97319 Non-pressure chronic ulcer of right ankle with unspecified severity: Secondary | ICD-10-CM | POA: Diagnosis not present

## 2022-07-21 DIAGNOSIS — I890 Lymphedema, not elsewhere classified: Secondary | ICD-10-CM | POA: Diagnosis not present

## 2022-07-21 DIAGNOSIS — L97521 Non-pressure chronic ulcer of other part of left foot limited to breakdown of skin: Secondary | ICD-10-CM | POA: Diagnosis not present

## 2022-07-24 DIAGNOSIS — I872 Venous insufficiency (chronic) (peripheral): Secondary | ICD-10-CM | POA: Diagnosis not present

## 2022-07-24 DIAGNOSIS — L84 Corns and callosities: Secondary | ICD-10-CM | POA: Diagnosis not present

## 2022-07-24 DIAGNOSIS — L97322 Non-pressure chronic ulcer of left ankle with fat layer exposed: Secondary | ICD-10-CM | POA: Diagnosis not present

## 2022-07-24 DIAGNOSIS — I890 Lymphedema, not elsewhere classified: Secondary | ICD-10-CM | POA: Diagnosis not present

## 2022-07-24 DIAGNOSIS — L97812 Non-pressure chronic ulcer of other part of right lower leg with fat layer exposed: Secondary | ICD-10-CM | POA: Diagnosis not present

## 2022-07-24 DIAGNOSIS — L97522 Non-pressure chronic ulcer of other part of left foot with fat layer exposed: Secondary | ICD-10-CM | POA: Diagnosis not present

## 2022-07-25 DIAGNOSIS — M810 Age-related osteoporosis without current pathological fracture: Secondary | ICD-10-CM | POA: Diagnosis not present

## 2022-07-25 DIAGNOSIS — L97322 Non-pressure chronic ulcer of left ankle with fat layer exposed: Secondary | ICD-10-CM | POA: Diagnosis not present

## 2022-07-25 DIAGNOSIS — E785 Hyperlipidemia, unspecified: Secondary | ICD-10-CM | POA: Diagnosis not present

## 2022-07-25 DIAGNOSIS — K219 Gastro-esophageal reflux disease without esophagitis: Secondary | ICD-10-CM | POA: Diagnosis not present

## 2022-07-25 DIAGNOSIS — Z9181 History of falling: Secondary | ICD-10-CM | POA: Diagnosis not present

## 2022-07-25 DIAGNOSIS — L97522 Non-pressure chronic ulcer of other part of left foot with fat layer exposed: Secondary | ICD-10-CM | POA: Diagnosis not present

## 2022-07-25 DIAGNOSIS — M170 Bilateral primary osteoarthritis of knee: Secondary | ICD-10-CM | POA: Diagnosis not present

## 2022-07-25 DIAGNOSIS — I7389 Other specified peripheral vascular diseases: Secondary | ICD-10-CM | POA: Diagnosis not present

## 2022-07-25 DIAGNOSIS — I872 Venous insufficiency (chronic) (peripheral): Secondary | ICD-10-CM | POA: Diagnosis not present

## 2022-07-25 DIAGNOSIS — I890 Lymphedema, not elsewhere classified: Secondary | ICD-10-CM | POA: Diagnosis not present

## 2022-07-25 DIAGNOSIS — L97812 Non-pressure chronic ulcer of other part of right lower leg with fat layer exposed: Secondary | ICD-10-CM | POA: Diagnosis not present

## 2022-07-25 DIAGNOSIS — L84 Corns and callosities: Secondary | ICD-10-CM | POA: Diagnosis not present

## 2022-07-27 DIAGNOSIS — L84 Corns and callosities: Secondary | ICD-10-CM | POA: Diagnosis not present

## 2022-07-27 DIAGNOSIS — L97812 Non-pressure chronic ulcer of other part of right lower leg with fat layer exposed: Secondary | ICD-10-CM | POA: Diagnosis not present

## 2022-07-27 DIAGNOSIS — I872 Venous insufficiency (chronic) (peripheral): Secondary | ICD-10-CM | POA: Diagnosis not present

## 2022-07-27 DIAGNOSIS — I890 Lymphedema, not elsewhere classified: Secondary | ICD-10-CM | POA: Diagnosis not present

## 2022-07-27 DIAGNOSIS — L97522 Non-pressure chronic ulcer of other part of left foot with fat layer exposed: Secondary | ICD-10-CM | POA: Diagnosis not present

## 2022-07-27 DIAGNOSIS — L97322 Non-pressure chronic ulcer of left ankle with fat layer exposed: Secondary | ICD-10-CM | POA: Diagnosis not present

## 2022-07-31 DIAGNOSIS — L84 Corns and callosities: Secondary | ICD-10-CM | POA: Diagnosis not present

## 2022-07-31 DIAGNOSIS — L97522 Non-pressure chronic ulcer of other part of left foot with fat layer exposed: Secondary | ICD-10-CM | POA: Diagnosis not present

## 2022-07-31 DIAGNOSIS — I872 Venous insufficiency (chronic) (peripheral): Secondary | ICD-10-CM | POA: Diagnosis not present

## 2022-07-31 DIAGNOSIS — L97812 Non-pressure chronic ulcer of other part of right lower leg with fat layer exposed: Secondary | ICD-10-CM | POA: Diagnosis not present

## 2022-07-31 DIAGNOSIS — I890 Lymphedema, not elsewhere classified: Secondary | ICD-10-CM | POA: Diagnosis not present

## 2022-07-31 DIAGNOSIS — L97322 Non-pressure chronic ulcer of left ankle with fat layer exposed: Secondary | ICD-10-CM | POA: Diagnosis not present

## 2022-08-01 DIAGNOSIS — M170 Bilateral primary osteoarthritis of knee: Secondary | ICD-10-CM | POA: Diagnosis not present

## 2022-08-01 DIAGNOSIS — I10 Essential (primary) hypertension: Secondary | ICD-10-CM | POA: Diagnosis not present

## 2022-08-03 DIAGNOSIS — L84 Corns and callosities: Secondary | ICD-10-CM | POA: Diagnosis not present

## 2022-08-03 DIAGNOSIS — I890 Lymphedema, not elsewhere classified: Secondary | ICD-10-CM | POA: Diagnosis not present

## 2022-08-03 DIAGNOSIS — L97322 Non-pressure chronic ulcer of left ankle with fat layer exposed: Secondary | ICD-10-CM | POA: Diagnosis not present

## 2022-08-03 DIAGNOSIS — L97812 Non-pressure chronic ulcer of other part of right lower leg with fat layer exposed: Secondary | ICD-10-CM | POA: Diagnosis not present

## 2022-08-03 DIAGNOSIS — L97522 Non-pressure chronic ulcer of other part of left foot with fat layer exposed: Secondary | ICD-10-CM | POA: Diagnosis not present

## 2022-08-03 DIAGNOSIS — I872 Venous insufficiency (chronic) (peripheral): Secondary | ICD-10-CM | POA: Diagnosis not present

## 2022-08-07 DIAGNOSIS — I872 Venous insufficiency (chronic) (peripheral): Secondary | ICD-10-CM | POA: Diagnosis not present

## 2022-08-07 DIAGNOSIS — L97522 Non-pressure chronic ulcer of other part of left foot with fat layer exposed: Secondary | ICD-10-CM | POA: Diagnosis not present

## 2022-08-07 DIAGNOSIS — I890 Lymphedema, not elsewhere classified: Secondary | ICD-10-CM | POA: Diagnosis not present

## 2022-08-07 DIAGNOSIS — L97812 Non-pressure chronic ulcer of other part of right lower leg with fat layer exposed: Secondary | ICD-10-CM | POA: Diagnosis not present

## 2022-08-07 DIAGNOSIS — L84 Corns and callosities: Secondary | ICD-10-CM | POA: Diagnosis not present

## 2022-08-07 DIAGNOSIS — L97322 Non-pressure chronic ulcer of left ankle with fat layer exposed: Secondary | ICD-10-CM | POA: Diagnosis not present

## 2022-08-10 DIAGNOSIS — L84 Corns and callosities: Secondary | ICD-10-CM | POA: Diagnosis not present

## 2022-08-10 DIAGNOSIS — L97522 Non-pressure chronic ulcer of other part of left foot with fat layer exposed: Secondary | ICD-10-CM | POA: Diagnosis not present

## 2022-08-10 DIAGNOSIS — L97812 Non-pressure chronic ulcer of other part of right lower leg with fat layer exposed: Secondary | ICD-10-CM | POA: Diagnosis not present

## 2022-08-10 DIAGNOSIS — I890 Lymphedema, not elsewhere classified: Secondary | ICD-10-CM | POA: Diagnosis not present

## 2022-08-10 DIAGNOSIS — L97322 Non-pressure chronic ulcer of left ankle with fat layer exposed: Secondary | ICD-10-CM | POA: Diagnosis not present

## 2022-08-10 DIAGNOSIS — I872 Venous insufficiency (chronic) (peripheral): Secondary | ICD-10-CM | POA: Diagnosis not present

## 2022-08-14 DIAGNOSIS — I872 Venous insufficiency (chronic) (peripheral): Secondary | ICD-10-CM | POA: Diagnosis not present

## 2022-08-14 DIAGNOSIS — L97522 Non-pressure chronic ulcer of other part of left foot with fat layer exposed: Secondary | ICD-10-CM | POA: Diagnosis not present

## 2022-08-14 DIAGNOSIS — L84 Corns and callosities: Secondary | ICD-10-CM | POA: Diagnosis not present

## 2022-08-14 DIAGNOSIS — L97322 Non-pressure chronic ulcer of left ankle with fat layer exposed: Secondary | ICD-10-CM | POA: Diagnosis not present

## 2022-08-14 DIAGNOSIS — I890 Lymphedema, not elsewhere classified: Secondary | ICD-10-CM | POA: Diagnosis not present

## 2022-08-14 DIAGNOSIS — L97812 Non-pressure chronic ulcer of other part of right lower leg with fat layer exposed: Secondary | ICD-10-CM | POA: Diagnosis not present

## 2022-08-16 DIAGNOSIS — L97329 Non-pressure chronic ulcer of left ankle with unspecified severity: Secondary | ICD-10-CM | POA: Diagnosis not present

## 2022-08-16 DIAGNOSIS — L97311 Non-pressure chronic ulcer of right ankle limited to breakdown of skin: Secondary | ICD-10-CM | POA: Diagnosis not present

## 2022-08-16 DIAGNOSIS — T148XXA Other injury of unspecified body region, initial encounter: Secondary | ICD-10-CM | POA: Diagnosis not present

## 2022-08-16 DIAGNOSIS — I890 Lymphedema, not elsewhere classified: Secondary | ICD-10-CM | POA: Diagnosis not present

## 2022-08-16 DIAGNOSIS — I83023 Varicose veins of left lower extremity with ulcer of ankle: Secondary | ICD-10-CM | POA: Diagnosis not present

## 2022-08-16 DIAGNOSIS — M810 Age-related osteoporosis without current pathological fracture: Secondary | ICD-10-CM | POA: Diagnosis not present

## 2022-08-16 DIAGNOSIS — L97521 Non-pressure chronic ulcer of other part of left foot limited to breakdown of skin: Secondary | ICD-10-CM | POA: Diagnosis not present

## 2022-08-16 DIAGNOSIS — I83013 Varicose veins of right lower extremity with ulcer of ankle: Secondary | ICD-10-CM | POA: Diagnosis not present

## 2022-08-16 DIAGNOSIS — I10 Essential (primary) hypertension: Secondary | ICD-10-CM | POA: Diagnosis not present

## 2022-08-18 DIAGNOSIS — R937 Abnormal findings on diagnostic imaging of other parts of musculoskeletal system: Secondary | ICD-10-CM | POA: Diagnosis not present

## 2022-08-18 DIAGNOSIS — S91001D Unspecified open wound, right ankle, subsequent encounter: Secondary | ICD-10-CM | POA: Diagnosis not present

## 2022-08-18 DIAGNOSIS — L97522 Non-pressure chronic ulcer of other part of left foot with fat layer exposed: Secondary | ICD-10-CM | POA: Diagnosis not present

## 2022-08-18 DIAGNOSIS — I890 Lymphedema, not elsewhere classified: Secondary | ICD-10-CM | POA: Diagnosis not present

## 2022-08-18 DIAGNOSIS — L97311 Non-pressure chronic ulcer of right ankle limited to breakdown of skin: Secondary | ICD-10-CM | POA: Diagnosis not present

## 2022-08-18 DIAGNOSIS — L97529 Non-pressure chronic ulcer of other part of left foot with unspecified severity: Secondary | ICD-10-CM | POA: Diagnosis not present

## 2022-08-18 DIAGNOSIS — L97322 Non-pressure chronic ulcer of left ankle with fat layer exposed: Secondary | ICD-10-CM | POA: Diagnosis not present

## 2022-08-18 DIAGNOSIS — L84 Corns and callosities: Secondary | ICD-10-CM | POA: Diagnosis not present

## 2022-08-18 DIAGNOSIS — L97812 Non-pressure chronic ulcer of other part of right lower leg with fat layer exposed: Secondary | ICD-10-CM | POA: Diagnosis not present

## 2022-08-18 DIAGNOSIS — M19071 Primary osteoarthritis, right ankle and foot: Secondary | ICD-10-CM | POA: Diagnosis not present

## 2022-08-18 DIAGNOSIS — I872 Venous insufficiency (chronic) (peripheral): Secondary | ICD-10-CM | POA: Diagnosis not present

## 2022-08-18 DIAGNOSIS — M7989 Other specified soft tissue disorders: Secondary | ICD-10-CM | POA: Diagnosis not present

## 2022-08-18 DIAGNOSIS — L97521 Non-pressure chronic ulcer of other part of left foot limited to breakdown of skin: Secondary | ICD-10-CM | POA: Diagnosis not present

## 2022-08-18 DIAGNOSIS — M659 Synovitis and tenosynovitis, unspecified: Secondary | ICD-10-CM | POA: Diagnosis not present

## 2022-08-21 DIAGNOSIS — L97322 Non-pressure chronic ulcer of left ankle with fat layer exposed: Secondary | ICD-10-CM | POA: Diagnosis not present

## 2022-08-21 DIAGNOSIS — L84 Corns and callosities: Secondary | ICD-10-CM | POA: Diagnosis not present

## 2022-08-21 DIAGNOSIS — I890 Lymphedema, not elsewhere classified: Secondary | ICD-10-CM | POA: Diagnosis not present

## 2022-08-21 DIAGNOSIS — L97522 Non-pressure chronic ulcer of other part of left foot with fat layer exposed: Secondary | ICD-10-CM | POA: Diagnosis not present

## 2022-08-21 DIAGNOSIS — I872 Venous insufficiency (chronic) (peripheral): Secondary | ICD-10-CM | POA: Diagnosis not present

## 2022-08-21 DIAGNOSIS — L97812 Non-pressure chronic ulcer of other part of right lower leg with fat layer exposed: Secondary | ICD-10-CM | POA: Diagnosis not present

## 2022-08-24 DIAGNOSIS — L97812 Non-pressure chronic ulcer of other part of right lower leg with fat layer exposed: Secondary | ICD-10-CM | POA: Diagnosis not present

## 2022-08-24 DIAGNOSIS — L97522 Non-pressure chronic ulcer of other part of left foot with fat layer exposed: Secondary | ICD-10-CM | POA: Diagnosis not present

## 2022-08-24 DIAGNOSIS — I7389 Other specified peripheral vascular diseases: Secondary | ICD-10-CM | POA: Diagnosis not present

## 2022-08-24 DIAGNOSIS — E785 Hyperlipidemia, unspecified: Secondary | ICD-10-CM | POA: Diagnosis not present

## 2022-08-24 DIAGNOSIS — Z9181 History of falling: Secondary | ICD-10-CM | POA: Diagnosis not present

## 2022-08-24 DIAGNOSIS — I872 Venous insufficiency (chronic) (peripheral): Secondary | ICD-10-CM | POA: Diagnosis not present

## 2022-08-24 DIAGNOSIS — I890 Lymphedema, not elsewhere classified: Secondary | ICD-10-CM | POA: Diagnosis not present

## 2022-08-24 DIAGNOSIS — M810 Age-related osteoporosis without current pathological fracture: Secondary | ICD-10-CM | POA: Diagnosis not present

## 2022-08-24 DIAGNOSIS — K219 Gastro-esophageal reflux disease without esophagitis: Secondary | ICD-10-CM | POA: Diagnosis not present

## 2022-08-24 DIAGNOSIS — L97322 Non-pressure chronic ulcer of left ankle with fat layer exposed: Secondary | ICD-10-CM | POA: Diagnosis not present

## 2022-08-24 DIAGNOSIS — M170 Bilateral primary osteoarthritis of knee: Secondary | ICD-10-CM | POA: Diagnosis not present

## 2022-08-24 DIAGNOSIS — L84 Corns and callosities: Secondary | ICD-10-CM | POA: Diagnosis not present

## 2022-08-28 DIAGNOSIS — L97322 Non-pressure chronic ulcer of left ankle with fat layer exposed: Secondary | ICD-10-CM | POA: Diagnosis not present

## 2022-08-28 DIAGNOSIS — I872 Venous insufficiency (chronic) (peripheral): Secondary | ICD-10-CM | POA: Diagnosis not present

## 2022-08-28 DIAGNOSIS — L97812 Non-pressure chronic ulcer of other part of right lower leg with fat layer exposed: Secondary | ICD-10-CM | POA: Diagnosis not present

## 2022-08-28 DIAGNOSIS — L97522 Non-pressure chronic ulcer of other part of left foot with fat layer exposed: Secondary | ICD-10-CM | POA: Diagnosis not present

## 2022-08-28 DIAGNOSIS — L84 Corns and callosities: Secondary | ICD-10-CM | POA: Diagnosis not present

## 2022-08-28 DIAGNOSIS — I890 Lymphedema, not elsewhere classified: Secondary | ICD-10-CM | POA: Diagnosis not present

## 2022-08-31 DIAGNOSIS — L97812 Non-pressure chronic ulcer of other part of right lower leg with fat layer exposed: Secondary | ICD-10-CM | POA: Diagnosis not present

## 2022-08-31 DIAGNOSIS — I890 Lymphedema, not elsewhere classified: Secondary | ICD-10-CM | POA: Diagnosis not present

## 2022-08-31 DIAGNOSIS — L84 Corns and callosities: Secondary | ICD-10-CM | POA: Diagnosis not present

## 2022-08-31 DIAGNOSIS — I872 Venous insufficiency (chronic) (peripheral): Secondary | ICD-10-CM | POA: Diagnosis not present

## 2022-08-31 DIAGNOSIS — L97522 Non-pressure chronic ulcer of other part of left foot with fat layer exposed: Secondary | ICD-10-CM | POA: Diagnosis not present

## 2022-08-31 DIAGNOSIS — L97322 Non-pressure chronic ulcer of left ankle with fat layer exposed: Secondary | ICD-10-CM | POA: Diagnosis not present

## 2022-09-04 DIAGNOSIS — I872 Venous insufficiency (chronic) (peripheral): Secondary | ICD-10-CM | POA: Diagnosis not present

## 2022-09-04 DIAGNOSIS — L97522 Non-pressure chronic ulcer of other part of left foot with fat layer exposed: Secondary | ICD-10-CM | POA: Diagnosis not present

## 2022-09-04 DIAGNOSIS — L97812 Non-pressure chronic ulcer of other part of right lower leg with fat layer exposed: Secondary | ICD-10-CM | POA: Diagnosis not present

## 2022-09-04 DIAGNOSIS — L97322 Non-pressure chronic ulcer of left ankle with fat layer exposed: Secondary | ICD-10-CM | POA: Diagnosis not present

## 2022-09-04 DIAGNOSIS — L84 Corns and callosities: Secondary | ICD-10-CM | POA: Diagnosis not present

## 2022-09-04 DIAGNOSIS — I890 Lymphedema, not elsewhere classified: Secondary | ICD-10-CM | POA: Diagnosis not present

## 2022-09-06 DIAGNOSIS — Z87891 Personal history of nicotine dependence: Secondary | ICD-10-CM | POA: Diagnosis not present

## 2022-09-06 DIAGNOSIS — L97911 Non-pressure chronic ulcer of unspecified part of right lower leg limited to breakdown of skin: Secondary | ICD-10-CM | POA: Diagnosis not present

## 2022-09-06 DIAGNOSIS — I890 Lymphedema, not elsewhere classified: Secondary | ICD-10-CM | POA: Diagnosis not present

## 2022-09-06 DIAGNOSIS — L97921 Non-pressure chronic ulcer of unspecified part of left lower leg limited to breakdown of skin: Secondary | ICD-10-CM | POA: Diagnosis not present

## 2022-09-07 DIAGNOSIS — L97522 Non-pressure chronic ulcer of other part of left foot with fat layer exposed: Secondary | ICD-10-CM | POA: Diagnosis not present

## 2022-09-07 DIAGNOSIS — L97322 Non-pressure chronic ulcer of left ankle with fat layer exposed: Secondary | ICD-10-CM | POA: Diagnosis not present

## 2022-09-07 DIAGNOSIS — I890 Lymphedema, not elsewhere classified: Secondary | ICD-10-CM | POA: Diagnosis not present

## 2022-09-07 DIAGNOSIS — I872 Venous insufficiency (chronic) (peripheral): Secondary | ICD-10-CM | POA: Diagnosis not present

## 2022-09-07 DIAGNOSIS — L97812 Non-pressure chronic ulcer of other part of right lower leg with fat layer exposed: Secondary | ICD-10-CM | POA: Diagnosis not present

## 2022-09-07 DIAGNOSIS — L84 Corns and callosities: Secondary | ICD-10-CM | POA: Diagnosis not present

## 2022-09-11 DIAGNOSIS — L97522 Non-pressure chronic ulcer of other part of left foot with fat layer exposed: Secondary | ICD-10-CM | POA: Diagnosis not present

## 2022-09-11 DIAGNOSIS — L97322 Non-pressure chronic ulcer of left ankle with fat layer exposed: Secondary | ICD-10-CM | POA: Diagnosis not present

## 2022-09-11 DIAGNOSIS — L97812 Non-pressure chronic ulcer of other part of right lower leg with fat layer exposed: Secondary | ICD-10-CM | POA: Diagnosis not present

## 2022-09-11 DIAGNOSIS — I872 Venous insufficiency (chronic) (peripheral): Secondary | ICD-10-CM | POA: Diagnosis not present

## 2022-09-11 DIAGNOSIS — L84 Corns and callosities: Secondary | ICD-10-CM | POA: Diagnosis not present

## 2022-09-11 DIAGNOSIS — I890 Lymphedema, not elsewhere classified: Secondary | ICD-10-CM | POA: Diagnosis not present

## 2022-09-14 DIAGNOSIS — I872 Venous insufficiency (chronic) (peripheral): Secondary | ICD-10-CM | POA: Diagnosis not present

## 2022-09-14 DIAGNOSIS — I890 Lymphedema, not elsewhere classified: Secondary | ICD-10-CM | POA: Diagnosis not present

## 2022-09-14 DIAGNOSIS — L97812 Non-pressure chronic ulcer of other part of right lower leg with fat layer exposed: Secondary | ICD-10-CM | POA: Diagnosis not present

## 2022-09-14 DIAGNOSIS — L97322 Non-pressure chronic ulcer of left ankle with fat layer exposed: Secondary | ICD-10-CM | POA: Diagnosis not present

## 2022-09-14 DIAGNOSIS — L97522 Non-pressure chronic ulcer of other part of left foot with fat layer exposed: Secondary | ICD-10-CM | POA: Diagnosis not present

## 2022-09-14 DIAGNOSIS — L84 Corns and callosities: Secondary | ICD-10-CM | POA: Diagnosis not present

## 2022-09-18 DIAGNOSIS — I872 Venous insufficiency (chronic) (peripheral): Secondary | ICD-10-CM | POA: Diagnosis not present

## 2022-09-18 DIAGNOSIS — L97322 Non-pressure chronic ulcer of left ankle with fat layer exposed: Secondary | ICD-10-CM | POA: Diagnosis not present

## 2022-09-18 DIAGNOSIS — L84 Corns and callosities: Secondary | ICD-10-CM | POA: Diagnosis not present

## 2022-09-18 DIAGNOSIS — L97812 Non-pressure chronic ulcer of other part of right lower leg with fat layer exposed: Secondary | ICD-10-CM | POA: Diagnosis not present

## 2022-09-18 DIAGNOSIS — L97522 Non-pressure chronic ulcer of other part of left foot with fat layer exposed: Secondary | ICD-10-CM | POA: Diagnosis not present

## 2022-09-18 DIAGNOSIS — I890 Lymphedema, not elsewhere classified: Secondary | ICD-10-CM | POA: Diagnosis not present

## 2022-09-20 DIAGNOSIS — M1990 Unspecified osteoarthritis, unspecified site: Secondary | ICD-10-CM | POA: Diagnosis not present

## 2022-09-20 DIAGNOSIS — I872 Venous insufficiency (chronic) (peripheral): Secondary | ICD-10-CM | POA: Diagnosis not present

## 2022-09-20 DIAGNOSIS — L97911 Non-pressure chronic ulcer of unspecified part of right lower leg limited to breakdown of skin: Secondary | ICD-10-CM | POA: Diagnosis not present

## 2022-09-20 DIAGNOSIS — L97921 Non-pressure chronic ulcer of unspecified part of left lower leg limited to breakdown of skin: Secondary | ICD-10-CM | POA: Diagnosis not present

## 2022-09-20 DIAGNOSIS — I10 Essential (primary) hypertension: Secondary | ICD-10-CM | POA: Diagnosis not present

## 2022-09-20 IMAGING — MR MR FOOT*L* WO/W CM
8 series · 40 of 40 positions shown · IV contrast (7 ml Gadavist)
Comparison: Left foot radiograph 09/22/2020

CLINICAL DATA: Foot swelling, nondiabetic, osteomyelitis suspected

EXAM:
MRI OF THE LEFT FOREFOOT WITHOUT AND WITH CONTRAST
TECHNIQUE: Multiplanar, multisequence MR imaging of the left foot was performed
both before and after administration of intravenous contrast.
CONTRAST:  7mL GADAVIST GADOBUTROL 1 MMOL/ML IV SOLN

[Series 3: T1 · coronal · left · 3.0mm · 0.38mm/px · 6 of 48 slices shown (1 of 2)]
[im 1/48]
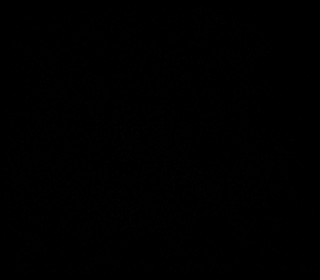
[im 10/48]
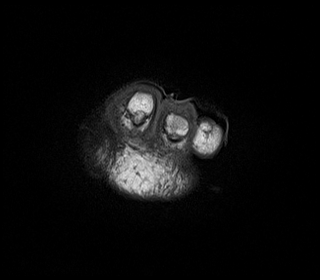
[im 19/48]
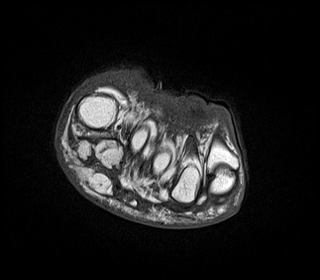
[im 29/48]
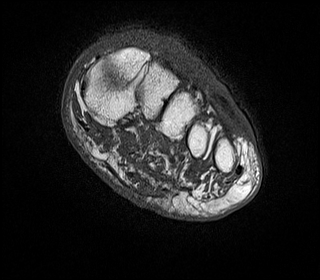
[im 38/48]
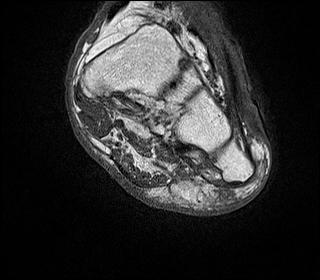
[im 48/48]
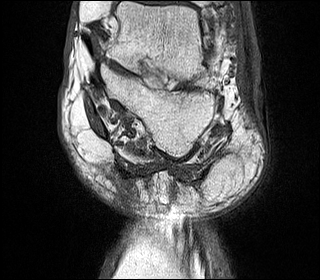

[Series 4: T2 fat-sat · coronal · left · 3.0mm · 0.38mm/px · 7 of 48 slices shown (1 of 2)]
[im 1/48]
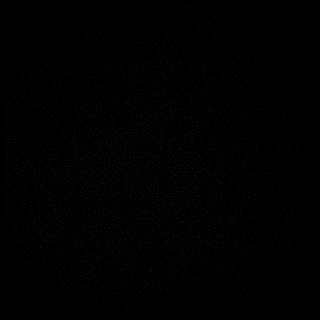
[im 8/48]
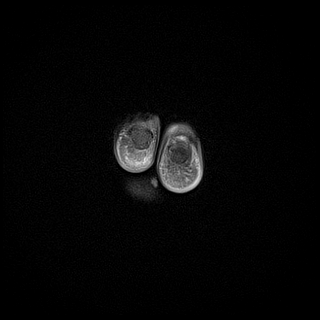
[im 16/48]
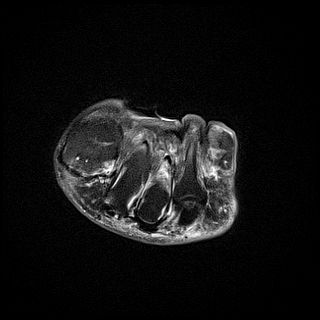
[im 24/48]
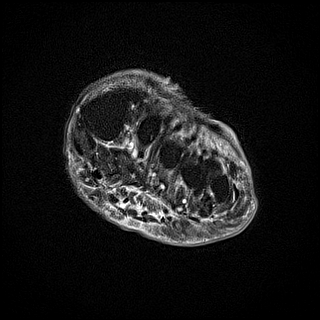
[im 32/48]
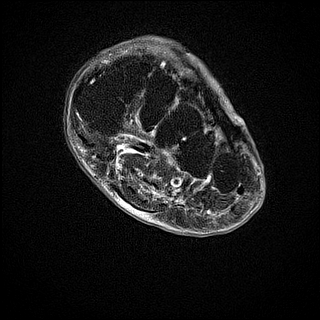
[im 40/48]
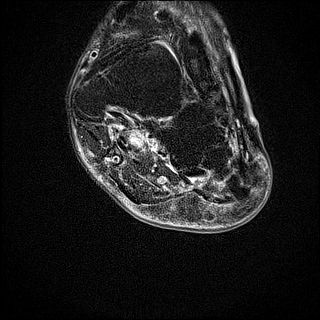
[im 48/48]
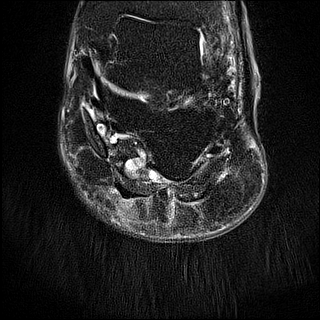

[Series 5: T1 · axial · left · 3.0mm · 0.70mm/px · z∈[-50,+22]mm · 3 of 20 slices shown (2 of 2)]
[im 1/20]
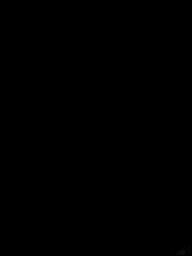
[im 10/20]
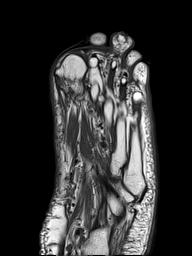
[im 20/20]
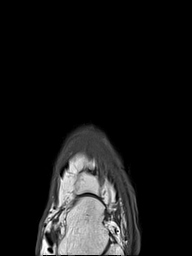

[Series 6: T2 fat-sat · axial · left · 3.0mm · 0.70mm/px · z∈[-48,+25]mm · 3 of 20 slices shown (2 of 2)]
[im 1/20]
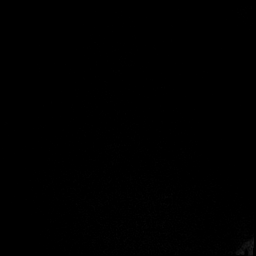
[im 10/20]
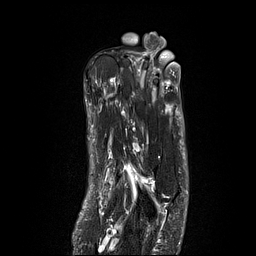
[im 20/20]
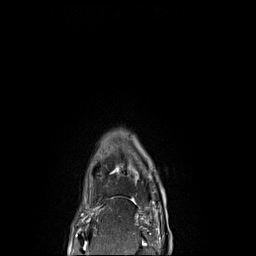

[Series 7: STIR · sagittal · left · 3.0mm · 0.70mm/px · 3 of 25 slices shown]
[im 1/25]
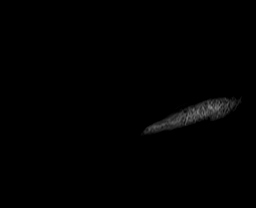
[im 13/25]
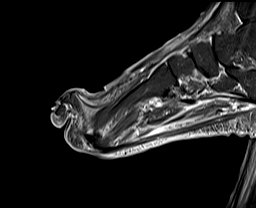
[im 25/25]
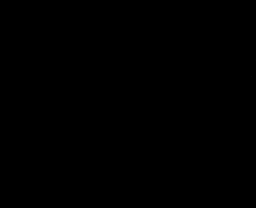

[Series 8: T1 fat-sat · coronal · non-contrast · left · 3.0mm · 0.59mm/px · 7 of 48 slices shown]
[im 1/48]
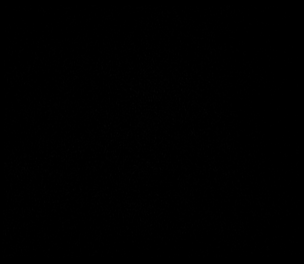
[im 8/48]
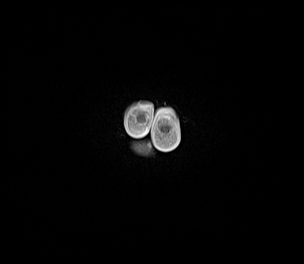
[im 16/48]
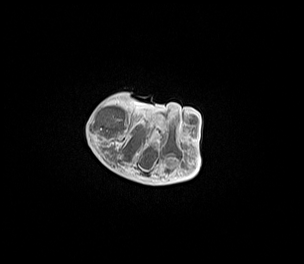
[im 24/48]
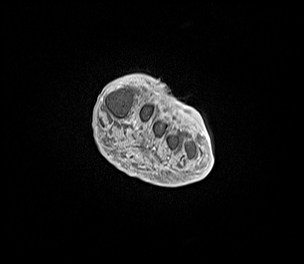
[im 32/48]
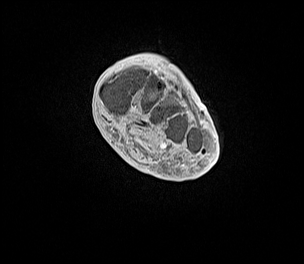
[im 40/48]
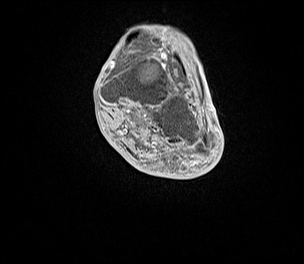
[im 48/48]
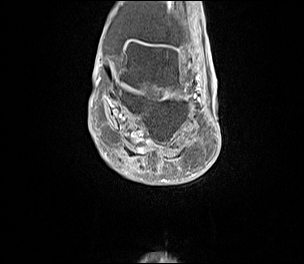

[Series 9: T1 fat-sat post-contrast · coronal · left · 3.0mm · 0.59mm/px · 7 of 48 slices shown (1 of 2)]
[im 1/48]
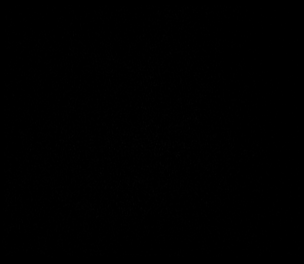
[im 8/48]
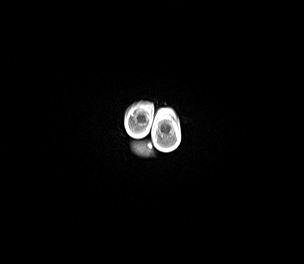
[im 16/48]
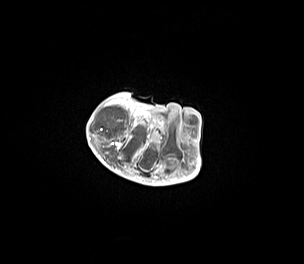
[im 24/48]
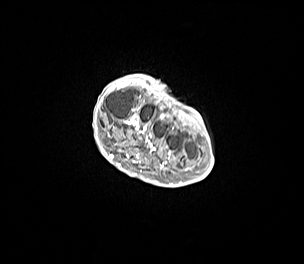
[im 32/48]
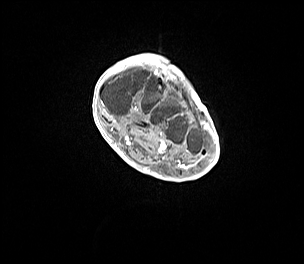
[im 40/48]
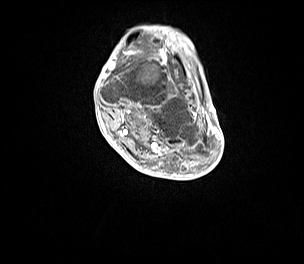
[im 48/48]
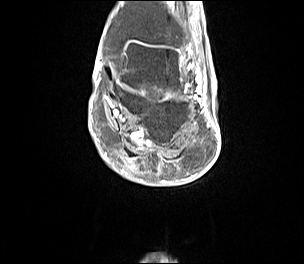

[Series 10: T1 fat-sat post-contrast · sagittal · left · 3.0mm · 0.70mm/px · 4 of 32 slices shown (2 of 2)]
[im 1/32]
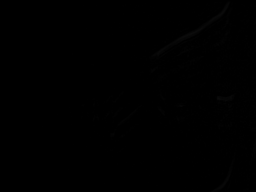
[im 11/32]
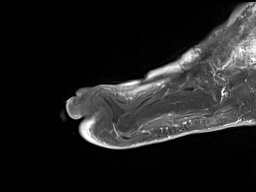
[im 21/32]
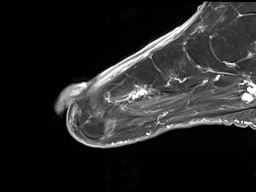
[im 32/32]
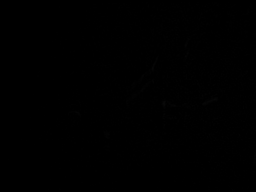

[40 of 40 positions shown; findings below may reference images not displayed]

FINDINGS: Bones/Joint/Cartilage

The cortex is intact. There is no significant marrow signal
alteration. Prior great toe amputation. There is dorsiflexion at the
metatarsophalangeal joints of the lesser digits and flexion at the
proximal interphalangeal joints.

Ligaments

The Lisfranc ligament is intact.

Muscles and Tendons

Mild muscle atrophy in the foot with intramuscular edema as can be
seen in diabetics. No acute tendon tear.

Soft tissues

Diffuse soft tissue swelling. There is soft tissue thickening and
ulceration along the dorsal forefoot.
IMPRESSION: Soft tissue swelling with ulceration along the dorsal foot. No
evidence of osteomyelitis or soft tissue abscess.

## 2022-09-22 DIAGNOSIS — L84 Corns and callosities: Secondary | ICD-10-CM | POA: Diagnosis not present

## 2022-09-22 DIAGNOSIS — L97522 Non-pressure chronic ulcer of other part of left foot with fat layer exposed: Secondary | ICD-10-CM | POA: Diagnosis not present

## 2022-09-22 DIAGNOSIS — I872 Venous insufficiency (chronic) (peripheral): Secondary | ICD-10-CM | POA: Diagnosis not present

## 2022-09-22 DIAGNOSIS — I890 Lymphedema, not elsewhere classified: Secondary | ICD-10-CM | POA: Diagnosis not present

## 2022-09-22 DIAGNOSIS — L97322 Non-pressure chronic ulcer of left ankle with fat layer exposed: Secondary | ICD-10-CM | POA: Diagnosis not present

## 2022-09-22 DIAGNOSIS — L97812 Non-pressure chronic ulcer of other part of right lower leg with fat layer exposed: Secondary | ICD-10-CM | POA: Diagnosis not present

## 2022-09-23 DIAGNOSIS — Z9181 History of falling: Secondary | ICD-10-CM | POA: Diagnosis not present

## 2022-09-23 DIAGNOSIS — L97522 Non-pressure chronic ulcer of other part of left foot with fat layer exposed: Secondary | ICD-10-CM | POA: Diagnosis not present

## 2022-09-23 DIAGNOSIS — M810 Age-related osteoporosis without current pathological fracture: Secondary | ICD-10-CM | POA: Diagnosis not present

## 2022-09-23 DIAGNOSIS — L84 Corns and callosities: Secondary | ICD-10-CM | POA: Diagnosis not present

## 2022-09-23 DIAGNOSIS — L97812 Non-pressure chronic ulcer of other part of right lower leg with fat layer exposed: Secondary | ICD-10-CM | POA: Diagnosis not present

## 2022-09-23 DIAGNOSIS — K219 Gastro-esophageal reflux disease without esophagitis: Secondary | ICD-10-CM | POA: Diagnosis not present

## 2022-09-23 DIAGNOSIS — I7389 Other specified peripheral vascular diseases: Secondary | ICD-10-CM | POA: Diagnosis not present

## 2022-09-23 DIAGNOSIS — E785 Hyperlipidemia, unspecified: Secondary | ICD-10-CM | POA: Diagnosis not present

## 2022-09-23 DIAGNOSIS — M170 Bilateral primary osteoarthritis of knee: Secondary | ICD-10-CM | POA: Diagnosis not present

## 2022-09-23 DIAGNOSIS — L97322 Non-pressure chronic ulcer of left ankle with fat layer exposed: Secondary | ICD-10-CM | POA: Diagnosis not present

## 2022-09-23 DIAGNOSIS — I890 Lymphedema, not elsewhere classified: Secondary | ICD-10-CM | POA: Diagnosis not present

## 2022-09-23 DIAGNOSIS — I872 Venous insufficiency (chronic) (peripheral): Secondary | ICD-10-CM | POA: Diagnosis not present

## 2022-09-25 DIAGNOSIS — L84 Corns and callosities: Secondary | ICD-10-CM | POA: Diagnosis not present

## 2022-09-25 DIAGNOSIS — I872 Venous insufficiency (chronic) (peripheral): Secondary | ICD-10-CM | POA: Diagnosis not present

## 2022-09-25 DIAGNOSIS — L97522 Non-pressure chronic ulcer of other part of left foot with fat layer exposed: Secondary | ICD-10-CM | POA: Diagnosis not present

## 2022-09-25 DIAGNOSIS — L97812 Non-pressure chronic ulcer of other part of right lower leg with fat layer exposed: Secondary | ICD-10-CM | POA: Diagnosis not present

## 2022-09-25 DIAGNOSIS — I890 Lymphedema, not elsewhere classified: Secondary | ICD-10-CM | POA: Diagnosis not present

## 2022-09-25 DIAGNOSIS — L97322 Non-pressure chronic ulcer of left ankle with fat layer exposed: Secondary | ICD-10-CM | POA: Diagnosis not present

## 2022-09-27 DIAGNOSIS — I890 Lymphedema, not elsewhere classified: Secondary | ICD-10-CM | POA: Diagnosis not present

## 2022-09-27 DIAGNOSIS — L97812 Non-pressure chronic ulcer of other part of right lower leg with fat layer exposed: Secondary | ICD-10-CM | POA: Diagnosis not present

## 2022-09-27 DIAGNOSIS — L97522 Non-pressure chronic ulcer of other part of left foot with fat layer exposed: Secondary | ICD-10-CM | POA: Diagnosis not present

## 2022-09-27 DIAGNOSIS — L97322 Non-pressure chronic ulcer of left ankle with fat layer exposed: Secondary | ICD-10-CM | POA: Diagnosis not present

## 2022-09-27 DIAGNOSIS — I872 Venous insufficiency (chronic) (peripheral): Secondary | ICD-10-CM | POA: Diagnosis not present

## 2022-09-27 DIAGNOSIS — L84 Corns and callosities: Secondary | ICD-10-CM | POA: Diagnosis not present

## 2022-09-29 DIAGNOSIS — L97812 Non-pressure chronic ulcer of other part of right lower leg with fat layer exposed: Secondary | ICD-10-CM | POA: Diagnosis not present

## 2022-09-29 DIAGNOSIS — I890 Lymphedema, not elsewhere classified: Secondary | ICD-10-CM | POA: Diagnosis not present

## 2022-09-29 DIAGNOSIS — L84 Corns and callosities: Secondary | ICD-10-CM | POA: Diagnosis not present

## 2022-09-29 DIAGNOSIS — L97522 Non-pressure chronic ulcer of other part of left foot with fat layer exposed: Secondary | ICD-10-CM | POA: Diagnosis not present

## 2022-09-29 DIAGNOSIS — L97322 Non-pressure chronic ulcer of left ankle with fat layer exposed: Secondary | ICD-10-CM | POA: Diagnosis not present

## 2022-09-29 DIAGNOSIS — I872 Venous insufficiency (chronic) (peripheral): Secondary | ICD-10-CM | POA: Diagnosis not present

## 2022-10-02 DIAGNOSIS — L84 Corns and callosities: Secondary | ICD-10-CM | POA: Diagnosis not present

## 2022-10-02 DIAGNOSIS — L97322 Non-pressure chronic ulcer of left ankle with fat layer exposed: Secondary | ICD-10-CM | POA: Diagnosis not present

## 2022-10-02 DIAGNOSIS — M170 Bilateral primary osteoarthritis of knee: Secondary | ICD-10-CM | POA: Diagnosis not present

## 2022-10-02 DIAGNOSIS — I878 Other specified disorders of veins: Secondary | ICD-10-CM | POA: Diagnosis not present

## 2022-10-02 DIAGNOSIS — I890 Lymphedema, not elsewhere classified: Secondary | ICD-10-CM | POA: Diagnosis not present

## 2022-10-02 DIAGNOSIS — I10 Essential (primary) hypertension: Secondary | ICD-10-CM | POA: Diagnosis not present

## 2022-10-02 DIAGNOSIS — L97812 Non-pressure chronic ulcer of other part of right lower leg with fat layer exposed: Secondary | ICD-10-CM | POA: Diagnosis not present

## 2022-10-02 DIAGNOSIS — L97522 Non-pressure chronic ulcer of other part of left foot with fat layer exposed: Secondary | ICD-10-CM | POA: Diagnosis not present

## 2022-10-02 DIAGNOSIS — I872 Venous insufficiency (chronic) (peripheral): Secondary | ICD-10-CM | POA: Diagnosis not present

## 2022-10-04 DIAGNOSIS — L84 Corns and callosities: Secondary | ICD-10-CM | POA: Diagnosis not present

## 2022-10-04 DIAGNOSIS — I890 Lymphedema, not elsewhere classified: Secondary | ICD-10-CM | POA: Diagnosis not present

## 2022-10-04 DIAGNOSIS — L97812 Non-pressure chronic ulcer of other part of right lower leg with fat layer exposed: Secondary | ICD-10-CM | POA: Diagnosis not present

## 2022-10-04 DIAGNOSIS — I872 Venous insufficiency (chronic) (peripheral): Secondary | ICD-10-CM | POA: Diagnosis not present

## 2022-10-04 DIAGNOSIS — L97322 Non-pressure chronic ulcer of left ankle with fat layer exposed: Secondary | ICD-10-CM | POA: Diagnosis not present

## 2022-10-04 DIAGNOSIS — L97522 Non-pressure chronic ulcer of other part of left foot with fat layer exposed: Secondary | ICD-10-CM | POA: Diagnosis not present

## 2022-10-06 DIAGNOSIS — I872 Venous insufficiency (chronic) (peripheral): Secondary | ICD-10-CM | POA: Diagnosis not present

## 2022-10-06 DIAGNOSIS — L97812 Non-pressure chronic ulcer of other part of right lower leg with fat layer exposed: Secondary | ICD-10-CM | POA: Diagnosis not present

## 2022-10-06 DIAGNOSIS — L97522 Non-pressure chronic ulcer of other part of left foot with fat layer exposed: Secondary | ICD-10-CM | POA: Diagnosis not present

## 2022-10-06 DIAGNOSIS — L84 Corns and callosities: Secondary | ICD-10-CM | POA: Diagnosis not present

## 2022-10-06 DIAGNOSIS — I890 Lymphedema, not elsewhere classified: Secondary | ICD-10-CM | POA: Diagnosis not present

## 2022-10-06 DIAGNOSIS — L97322 Non-pressure chronic ulcer of left ankle with fat layer exposed: Secondary | ICD-10-CM | POA: Diagnosis not present

## 2022-10-09 DIAGNOSIS — L84 Corns and callosities: Secondary | ICD-10-CM | POA: Diagnosis not present

## 2022-10-09 DIAGNOSIS — L97812 Non-pressure chronic ulcer of other part of right lower leg with fat layer exposed: Secondary | ICD-10-CM | POA: Diagnosis not present

## 2022-10-09 DIAGNOSIS — L97322 Non-pressure chronic ulcer of left ankle with fat layer exposed: Secondary | ICD-10-CM | POA: Diagnosis not present

## 2022-10-09 DIAGNOSIS — L97522 Non-pressure chronic ulcer of other part of left foot with fat layer exposed: Secondary | ICD-10-CM | POA: Diagnosis not present

## 2022-10-09 DIAGNOSIS — I890 Lymphedema, not elsewhere classified: Secondary | ICD-10-CM | POA: Diagnosis not present

## 2022-10-09 DIAGNOSIS — I872 Venous insufficiency (chronic) (peripheral): Secondary | ICD-10-CM | POA: Diagnosis not present

## 2022-10-10 DIAGNOSIS — I739 Peripheral vascular disease, unspecified: Secondary | ICD-10-CM | POA: Diagnosis not present

## 2022-10-10 DIAGNOSIS — L600 Ingrowing nail: Secondary | ICD-10-CM | POA: Diagnosis not present

## 2022-10-10 DIAGNOSIS — B351 Tinea unguium: Secondary | ICD-10-CM | POA: Diagnosis not present

## 2022-10-10 DIAGNOSIS — M2041 Other hammer toe(s) (acquired), right foot: Secondary | ICD-10-CM | POA: Diagnosis not present

## 2022-10-10 DIAGNOSIS — M2042 Other hammer toe(s) (acquired), left foot: Secondary | ICD-10-CM | POA: Diagnosis not present

## 2022-10-10 DIAGNOSIS — L84 Corns and callosities: Secondary | ICD-10-CM | POA: Diagnosis not present

## 2022-10-10 DIAGNOSIS — L602 Onychogryphosis: Secondary | ICD-10-CM | POA: Diagnosis not present

## 2022-10-11 DIAGNOSIS — I872 Venous insufficiency (chronic) (peripheral): Secondary | ICD-10-CM | POA: Diagnosis not present

## 2022-10-11 DIAGNOSIS — L97322 Non-pressure chronic ulcer of left ankle with fat layer exposed: Secondary | ICD-10-CM | POA: Diagnosis not present

## 2022-10-11 DIAGNOSIS — L97812 Non-pressure chronic ulcer of other part of right lower leg with fat layer exposed: Secondary | ICD-10-CM | POA: Diagnosis not present

## 2022-10-11 DIAGNOSIS — L97522 Non-pressure chronic ulcer of other part of left foot with fat layer exposed: Secondary | ICD-10-CM | POA: Diagnosis not present

## 2022-10-11 DIAGNOSIS — I890 Lymphedema, not elsewhere classified: Secondary | ICD-10-CM | POA: Diagnosis not present

## 2022-10-11 DIAGNOSIS — L84 Corns and callosities: Secondary | ICD-10-CM | POA: Diagnosis not present

## 2022-10-13 DIAGNOSIS — L84 Corns and callosities: Secondary | ICD-10-CM | POA: Diagnosis not present

## 2022-10-13 DIAGNOSIS — I872 Venous insufficiency (chronic) (peripheral): Secondary | ICD-10-CM | POA: Diagnosis not present

## 2022-10-13 DIAGNOSIS — L97322 Non-pressure chronic ulcer of left ankle with fat layer exposed: Secondary | ICD-10-CM | POA: Diagnosis not present

## 2022-10-13 DIAGNOSIS — L97522 Non-pressure chronic ulcer of other part of left foot with fat layer exposed: Secondary | ICD-10-CM | POA: Diagnosis not present

## 2022-10-13 DIAGNOSIS — L97812 Non-pressure chronic ulcer of other part of right lower leg with fat layer exposed: Secondary | ICD-10-CM | POA: Diagnosis not present

## 2022-10-13 DIAGNOSIS — I890 Lymphedema, not elsewhere classified: Secondary | ICD-10-CM | POA: Diagnosis not present

## 2022-10-16 DIAGNOSIS — L84 Corns and callosities: Secondary | ICD-10-CM | POA: Diagnosis not present

## 2022-10-16 DIAGNOSIS — L97322 Non-pressure chronic ulcer of left ankle with fat layer exposed: Secondary | ICD-10-CM | POA: Diagnosis not present

## 2022-10-16 DIAGNOSIS — I890 Lymphedema, not elsewhere classified: Secondary | ICD-10-CM | POA: Diagnosis not present

## 2022-10-16 DIAGNOSIS — L97812 Non-pressure chronic ulcer of other part of right lower leg with fat layer exposed: Secondary | ICD-10-CM | POA: Diagnosis not present

## 2022-10-16 DIAGNOSIS — L97522 Non-pressure chronic ulcer of other part of left foot with fat layer exposed: Secondary | ICD-10-CM | POA: Diagnosis not present

## 2022-10-16 DIAGNOSIS — I872 Venous insufficiency (chronic) (peripheral): Secondary | ICD-10-CM | POA: Diagnosis not present

## 2022-10-18 DIAGNOSIS — L97322 Non-pressure chronic ulcer of left ankle with fat layer exposed: Secondary | ICD-10-CM | POA: Diagnosis not present

## 2022-10-18 DIAGNOSIS — L84 Corns and callosities: Secondary | ICD-10-CM | POA: Diagnosis not present

## 2022-10-18 DIAGNOSIS — L97522 Non-pressure chronic ulcer of other part of left foot with fat layer exposed: Secondary | ICD-10-CM | POA: Diagnosis not present

## 2022-10-18 DIAGNOSIS — L97812 Non-pressure chronic ulcer of other part of right lower leg with fat layer exposed: Secondary | ICD-10-CM | POA: Diagnosis not present

## 2022-10-18 DIAGNOSIS — I890 Lymphedema, not elsewhere classified: Secondary | ICD-10-CM | POA: Diagnosis not present

## 2022-10-18 DIAGNOSIS — I872 Venous insufficiency (chronic) (peripheral): Secondary | ICD-10-CM | POA: Diagnosis not present

## 2022-10-20 DIAGNOSIS — L97322 Non-pressure chronic ulcer of left ankle with fat layer exposed: Secondary | ICD-10-CM | POA: Diagnosis not present

## 2022-10-20 DIAGNOSIS — I872 Venous insufficiency (chronic) (peripheral): Secondary | ICD-10-CM | POA: Diagnosis not present

## 2022-10-20 DIAGNOSIS — L97522 Non-pressure chronic ulcer of other part of left foot with fat layer exposed: Secondary | ICD-10-CM | POA: Diagnosis not present

## 2022-10-20 DIAGNOSIS — I890 Lymphedema, not elsewhere classified: Secondary | ICD-10-CM | POA: Diagnosis not present

## 2022-10-20 DIAGNOSIS — L84 Corns and callosities: Secondary | ICD-10-CM | POA: Diagnosis not present

## 2022-10-20 DIAGNOSIS — L97812 Non-pressure chronic ulcer of other part of right lower leg with fat layer exposed: Secondary | ICD-10-CM | POA: Diagnosis not present

## 2022-10-23 DIAGNOSIS — M810 Age-related osteoporosis without current pathological fracture: Secondary | ICD-10-CM | POA: Diagnosis not present

## 2022-10-23 DIAGNOSIS — I890 Lymphedema, not elsewhere classified: Secondary | ICD-10-CM | POA: Diagnosis not present

## 2022-10-23 DIAGNOSIS — L97812 Non-pressure chronic ulcer of other part of right lower leg with fat layer exposed: Secondary | ICD-10-CM | POA: Diagnosis not present

## 2022-10-23 DIAGNOSIS — I872 Venous insufficiency (chronic) (peripheral): Secondary | ICD-10-CM | POA: Diagnosis not present

## 2022-10-23 DIAGNOSIS — K219 Gastro-esophageal reflux disease without esophagitis: Secondary | ICD-10-CM | POA: Diagnosis not present

## 2022-10-23 DIAGNOSIS — L97322 Non-pressure chronic ulcer of left ankle with fat layer exposed: Secondary | ICD-10-CM | POA: Diagnosis not present

## 2022-10-23 DIAGNOSIS — Z9181 History of falling: Secondary | ICD-10-CM | POA: Diagnosis not present

## 2022-10-23 DIAGNOSIS — L84 Corns and callosities: Secondary | ICD-10-CM | POA: Diagnosis not present

## 2022-10-23 DIAGNOSIS — L97522 Non-pressure chronic ulcer of other part of left foot with fat layer exposed: Secondary | ICD-10-CM | POA: Diagnosis not present

## 2022-10-23 DIAGNOSIS — M170 Bilateral primary osteoarthritis of knee: Secondary | ICD-10-CM | POA: Diagnosis not present

## 2022-10-23 DIAGNOSIS — E785 Hyperlipidemia, unspecified: Secondary | ICD-10-CM | POA: Diagnosis not present

## 2022-10-23 DIAGNOSIS — I7389 Other specified peripheral vascular diseases: Secondary | ICD-10-CM | POA: Diagnosis not present

## 2022-10-25 DIAGNOSIS — I10 Essential (primary) hypertension: Secondary | ICD-10-CM | POA: Diagnosis not present

## 2022-10-25 DIAGNOSIS — L97321 Non-pressure chronic ulcer of left ankle limited to breakdown of skin: Secondary | ICD-10-CM | POA: Diagnosis not present

## 2022-10-25 DIAGNOSIS — M1990 Unspecified osteoarthritis, unspecified site: Secondary | ICD-10-CM | POA: Diagnosis not present

## 2022-10-25 DIAGNOSIS — I83018 Varicose veins of right lower extremity with ulcer other part of lower leg: Secondary | ICD-10-CM | POA: Diagnosis not present

## 2022-10-25 DIAGNOSIS — L97811 Non-pressure chronic ulcer of other part of right lower leg limited to breakdown of skin: Secondary | ICD-10-CM | POA: Diagnosis not present

## 2022-10-25 DIAGNOSIS — M810 Age-related osteoporosis without current pathological fracture: Secondary | ICD-10-CM | POA: Diagnosis not present

## 2022-10-25 DIAGNOSIS — L97521 Non-pressure chronic ulcer of other part of left foot limited to breakdown of skin: Secondary | ICD-10-CM | POA: Diagnosis not present

## 2022-10-25 DIAGNOSIS — L84 Corns and callosities: Secondary | ICD-10-CM | POA: Diagnosis not present

## 2022-10-25 DIAGNOSIS — L97911 Non-pressure chronic ulcer of unspecified part of right lower leg limited to breakdown of skin: Secondary | ICD-10-CM | POA: Diagnosis not present

## 2022-10-25 DIAGNOSIS — M25569 Pain in unspecified knee: Secondary | ICD-10-CM | POA: Diagnosis not present

## 2022-10-25 DIAGNOSIS — G8929 Other chronic pain: Secondary | ICD-10-CM | POA: Diagnosis not present

## 2022-10-25 DIAGNOSIS — I83028 Varicose veins of left lower extremity with ulcer other part of lower leg: Secondary | ICD-10-CM | POA: Diagnosis not present

## 2022-10-25 DIAGNOSIS — I872 Venous insufficiency (chronic) (peripheral): Secondary | ICD-10-CM | POA: Diagnosis not present

## 2022-10-25 DIAGNOSIS — I890 Lymphedema, not elsewhere classified: Secondary | ICD-10-CM | POA: Diagnosis not present

## 2022-10-27 DIAGNOSIS — L97322 Non-pressure chronic ulcer of left ankle with fat layer exposed: Secondary | ICD-10-CM | POA: Diagnosis not present

## 2022-10-27 DIAGNOSIS — L97522 Non-pressure chronic ulcer of other part of left foot with fat layer exposed: Secondary | ICD-10-CM | POA: Diagnosis not present

## 2022-10-27 DIAGNOSIS — L97812 Non-pressure chronic ulcer of other part of right lower leg with fat layer exposed: Secondary | ICD-10-CM | POA: Diagnosis not present

## 2022-10-27 DIAGNOSIS — I890 Lymphedema, not elsewhere classified: Secondary | ICD-10-CM | POA: Diagnosis not present

## 2022-10-27 DIAGNOSIS — L84 Corns and callosities: Secondary | ICD-10-CM | POA: Diagnosis not present

## 2022-10-27 DIAGNOSIS — I872 Venous insufficiency (chronic) (peripheral): Secondary | ICD-10-CM | POA: Diagnosis not present

## 2022-10-28 DIAGNOSIS — Z23 Encounter for immunization: Secondary | ICD-10-CM | POA: Diagnosis not present

## 2022-10-28 DIAGNOSIS — Z7185 Encounter for immunization safety counseling: Secondary | ICD-10-CM | POA: Diagnosis not present

## 2022-10-30 DIAGNOSIS — L97812 Non-pressure chronic ulcer of other part of right lower leg with fat layer exposed: Secondary | ICD-10-CM | POA: Diagnosis not present

## 2022-10-30 DIAGNOSIS — L84 Corns and callosities: Secondary | ICD-10-CM | POA: Diagnosis not present

## 2022-10-30 DIAGNOSIS — I890 Lymphedema, not elsewhere classified: Secondary | ICD-10-CM | POA: Diagnosis not present

## 2022-10-30 DIAGNOSIS — L97522 Non-pressure chronic ulcer of other part of left foot with fat layer exposed: Secondary | ICD-10-CM | POA: Diagnosis not present

## 2022-10-30 DIAGNOSIS — L97322 Non-pressure chronic ulcer of left ankle with fat layer exposed: Secondary | ICD-10-CM | POA: Diagnosis not present

## 2022-10-30 DIAGNOSIS — I872 Venous insufficiency (chronic) (peripheral): Secondary | ICD-10-CM | POA: Diagnosis not present

## 2022-11-01 DIAGNOSIS — I890 Lymphedema, not elsewhere classified: Secondary | ICD-10-CM | POA: Diagnosis not present

## 2022-11-01 DIAGNOSIS — L97322 Non-pressure chronic ulcer of left ankle with fat layer exposed: Secondary | ICD-10-CM | POA: Diagnosis not present

## 2022-11-01 DIAGNOSIS — L97812 Non-pressure chronic ulcer of other part of right lower leg with fat layer exposed: Secondary | ICD-10-CM | POA: Diagnosis not present

## 2022-11-01 DIAGNOSIS — L97522 Non-pressure chronic ulcer of other part of left foot with fat layer exposed: Secondary | ICD-10-CM | POA: Diagnosis not present

## 2022-11-01 DIAGNOSIS — L84 Corns and callosities: Secondary | ICD-10-CM | POA: Diagnosis not present

## 2022-11-01 DIAGNOSIS — I872 Venous insufficiency (chronic) (peripheral): Secondary | ICD-10-CM | POA: Diagnosis not present

## 2022-11-03 DIAGNOSIS — L84 Corns and callosities: Secondary | ICD-10-CM | POA: Diagnosis not present

## 2022-11-03 DIAGNOSIS — I872 Venous insufficiency (chronic) (peripheral): Secondary | ICD-10-CM | POA: Diagnosis not present

## 2022-11-03 DIAGNOSIS — L97812 Non-pressure chronic ulcer of other part of right lower leg with fat layer exposed: Secondary | ICD-10-CM | POA: Diagnosis not present

## 2022-11-03 DIAGNOSIS — L97322 Non-pressure chronic ulcer of left ankle with fat layer exposed: Secondary | ICD-10-CM | POA: Diagnosis not present

## 2022-11-03 DIAGNOSIS — I890 Lymphedema, not elsewhere classified: Secondary | ICD-10-CM | POA: Diagnosis not present

## 2022-11-03 DIAGNOSIS — L97522 Non-pressure chronic ulcer of other part of left foot with fat layer exposed: Secondary | ICD-10-CM | POA: Diagnosis not present

## 2022-11-06 DIAGNOSIS — L84 Corns and callosities: Secondary | ICD-10-CM | POA: Diagnosis not present

## 2022-11-06 DIAGNOSIS — L97322 Non-pressure chronic ulcer of left ankle with fat layer exposed: Secondary | ICD-10-CM | POA: Diagnosis not present

## 2022-11-06 DIAGNOSIS — L97812 Non-pressure chronic ulcer of other part of right lower leg with fat layer exposed: Secondary | ICD-10-CM | POA: Diagnosis not present

## 2022-11-06 DIAGNOSIS — I890 Lymphedema, not elsewhere classified: Secondary | ICD-10-CM | POA: Diagnosis not present

## 2022-11-06 DIAGNOSIS — L97522 Non-pressure chronic ulcer of other part of left foot with fat layer exposed: Secondary | ICD-10-CM | POA: Diagnosis not present

## 2022-11-06 DIAGNOSIS — I872 Venous insufficiency (chronic) (peripheral): Secondary | ICD-10-CM | POA: Diagnosis not present

## 2022-11-08 DIAGNOSIS — I872 Venous insufficiency (chronic) (peripheral): Secondary | ICD-10-CM | POA: Diagnosis not present

## 2022-11-08 DIAGNOSIS — L84 Corns and callosities: Secondary | ICD-10-CM | POA: Diagnosis not present

## 2022-11-08 DIAGNOSIS — I890 Lymphedema, not elsewhere classified: Secondary | ICD-10-CM | POA: Diagnosis not present

## 2022-11-08 DIAGNOSIS — L97322 Non-pressure chronic ulcer of left ankle with fat layer exposed: Secondary | ICD-10-CM | POA: Diagnosis not present

## 2022-11-08 DIAGNOSIS — L97522 Non-pressure chronic ulcer of other part of left foot with fat layer exposed: Secondary | ICD-10-CM | POA: Diagnosis not present

## 2022-11-08 DIAGNOSIS — L97812 Non-pressure chronic ulcer of other part of right lower leg with fat layer exposed: Secondary | ICD-10-CM | POA: Diagnosis not present

## 2022-11-10 DIAGNOSIS — L97812 Non-pressure chronic ulcer of other part of right lower leg with fat layer exposed: Secondary | ICD-10-CM | POA: Diagnosis not present

## 2022-11-10 DIAGNOSIS — L84 Corns and callosities: Secondary | ICD-10-CM | POA: Diagnosis not present

## 2022-11-10 DIAGNOSIS — L97522 Non-pressure chronic ulcer of other part of left foot with fat layer exposed: Secondary | ICD-10-CM | POA: Diagnosis not present

## 2022-11-10 DIAGNOSIS — I872 Venous insufficiency (chronic) (peripheral): Secondary | ICD-10-CM | POA: Diagnosis not present

## 2022-11-10 DIAGNOSIS — L97322 Non-pressure chronic ulcer of left ankle with fat layer exposed: Secondary | ICD-10-CM | POA: Diagnosis not present

## 2022-11-10 DIAGNOSIS — I890 Lymphedema, not elsewhere classified: Secondary | ICD-10-CM | POA: Diagnosis not present

## 2022-11-13 DIAGNOSIS — L84 Corns and callosities: Secondary | ICD-10-CM | POA: Diagnosis not present

## 2022-11-13 DIAGNOSIS — L97812 Non-pressure chronic ulcer of other part of right lower leg with fat layer exposed: Secondary | ICD-10-CM | POA: Diagnosis not present

## 2022-11-13 DIAGNOSIS — I890 Lymphedema, not elsewhere classified: Secondary | ICD-10-CM | POA: Diagnosis not present

## 2022-11-13 DIAGNOSIS — L97522 Non-pressure chronic ulcer of other part of left foot with fat layer exposed: Secondary | ICD-10-CM | POA: Diagnosis not present

## 2022-11-13 DIAGNOSIS — I872 Venous insufficiency (chronic) (peripheral): Secondary | ICD-10-CM | POA: Diagnosis not present

## 2022-11-13 DIAGNOSIS — L97322 Non-pressure chronic ulcer of left ankle with fat layer exposed: Secondary | ICD-10-CM | POA: Diagnosis not present

## 2022-11-14 DIAGNOSIS — I119 Hypertensive heart disease without heart failure: Secondary | ICD-10-CM | POA: Diagnosis not present

## 2022-11-14 DIAGNOSIS — I890 Lymphedema, not elsewhere classified: Secondary | ICD-10-CM | POA: Diagnosis not present

## 2022-11-14 DIAGNOSIS — Z882 Allergy status to sulfonamides status: Secondary | ICD-10-CM | POA: Diagnosis not present

## 2022-11-14 DIAGNOSIS — L97919 Non-pressure chronic ulcer of unspecified part of right lower leg with unspecified severity: Secondary | ICD-10-CM | POA: Diagnosis not present

## 2022-11-14 DIAGNOSIS — M25561 Pain in right knee: Secondary | ICD-10-CM | POA: Diagnosis not present

## 2022-11-14 DIAGNOSIS — L089 Local infection of the skin and subcutaneous tissue, unspecified: Secondary | ICD-10-CM | POA: Diagnosis not present

## 2022-11-14 DIAGNOSIS — Z7401 Bed confinement status: Secondary | ICD-10-CM | POA: Diagnosis not present

## 2022-11-14 DIAGNOSIS — I83023 Varicose veins of left lower extremity with ulcer of ankle: Secondary | ICD-10-CM | POA: Diagnosis not present

## 2022-11-14 DIAGNOSIS — I4581 Long QT syndrome: Secondary | ICD-10-CM | POA: Diagnosis not present

## 2022-11-14 DIAGNOSIS — L97819 Non-pressure chronic ulcer of other part of right lower leg with unspecified severity: Secondary | ICD-10-CM | POA: Diagnosis not present

## 2022-11-14 DIAGNOSIS — I87313 Chronic venous hypertension (idiopathic) with ulcer of bilateral lower extremity: Secondary | ICD-10-CM | POA: Diagnosis not present

## 2022-11-14 DIAGNOSIS — M130 Polyarthritis, unspecified: Secondary | ICD-10-CM | POA: Diagnosis not present

## 2022-11-14 DIAGNOSIS — Z880 Allergy status to penicillin: Secondary | ICD-10-CM | POA: Diagnosis not present

## 2022-11-14 DIAGNOSIS — G8929 Other chronic pain: Secondary | ICD-10-CM | POA: Diagnosis not present

## 2022-11-14 DIAGNOSIS — L97529 Non-pressure chronic ulcer of other part of left foot with unspecified severity: Secondary | ICD-10-CM | POA: Diagnosis not present

## 2022-11-14 DIAGNOSIS — G894 Chronic pain syndrome: Secondary | ICD-10-CM | POA: Diagnosis not present

## 2022-11-14 DIAGNOSIS — B965 Pseudomonas (aeruginosa) (mallei) (pseudomallei) as the cause of diseases classified elsewhere: Secondary | ICD-10-CM | POA: Diagnosis not present

## 2022-11-14 DIAGNOSIS — M6250 Muscle wasting and atrophy, not elsewhere classified, unspecified site: Secondary | ICD-10-CM | POA: Diagnosis not present

## 2022-11-14 DIAGNOSIS — Z91018 Allergy to other foods: Secondary | ICD-10-CM | POA: Diagnosis not present

## 2022-11-14 DIAGNOSIS — I10 Essential (primary) hypertension: Secondary | ICD-10-CM | POA: Diagnosis not present

## 2022-11-14 DIAGNOSIS — I872 Venous insufficiency (chronic) (peripheral): Secondary | ICD-10-CM | POA: Diagnosis not present

## 2022-11-14 DIAGNOSIS — S81801A Unspecified open wound, right lower leg, initial encounter: Secondary | ICD-10-CM | POA: Diagnosis not present

## 2022-11-14 DIAGNOSIS — Z87891 Personal history of nicotine dependence: Secondary | ICD-10-CM | POA: Diagnosis not present

## 2022-11-14 DIAGNOSIS — I96 Gangrene, not elsewhere classified: Secondary | ICD-10-CM | POA: Diagnosis not present

## 2022-11-14 DIAGNOSIS — L97909 Non-pressure chronic ulcer of unspecified part of unspecified lower leg with unspecified severity: Secondary | ICD-10-CM | POA: Diagnosis not present

## 2022-11-14 DIAGNOSIS — S91302A Unspecified open wound, left foot, initial encounter: Secondary | ICD-10-CM | POA: Diagnosis not present

## 2022-11-14 DIAGNOSIS — M810 Age-related osteoporosis without current pathological fracture: Secondary | ICD-10-CM | POA: Diagnosis not present

## 2022-11-14 DIAGNOSIS — R531 Weakness: Secondary | ICD-10-CM | POA: Diagnosis not present

## 2022-11-14 DIAGNOSIS — M1990 Unspecified osteoarthritis, unspecified site: Secondary | ICD-10-CM | POA: Diagnosis not present

## 2022-11-14 DIAGNOSIS — B9562 Methicillin resistant Staphylococcus aureus infection as the cause of diseases classified elsewhere: Secondary | ICD-10-CM | POA: Diagnosis not present

## 2022-11-14 DIAGNOSIS — Z48817 Encounter for surgical aftercare following surgery on the skin and subcutaneous tissue: Secondary | ICD-10-CM | POA: Diagnosis not present

## 2022-11-14 DIAGNOSIS — L97829 Non-pressure chronic ulcer of other part of left lower leg with unspecified severity: Secondary | ICD-10-CM | POA: Diagnosis not present

## 2022-11-14 DIAGNOSIS — L97329 Non-pressure chronic ulcer of left ankle with unspecified severity: Secondary | ICD-10-CM | POA: Diagnosis not present

## 2022-11-21 DIAGNOSIS — L97829 Non-pressure chronic ulcer of other part of left lower leg with unspecified severity: Secondary | ICD-10-CM | POA: Diagnosis not present

## 2022-11-21 DIAGNOSIS — I890 Lymphedema, not elsewhere classified: Secondary | ICD-10-CM | POA: Diagnosis not present

## 2022-11-21 DIAGNOSIS — I872 Venous insufficiency (chronic) (peripheral): Secondary | ICD-10-CM | POA: Diagnosis not present

## 2022-11-21 DIAGNOSIS — L97819 Non-pressure chronic ulcer of other part of right lower leg with unspecified severity: Secondary | ICD-10-CM | POA: Diagnosis not present

## 2022-11-21 DIAGNOSIS — M6250 Muscle wasting and atrophy, not elsewhere classified, unspecified site: Secondary | ICD-10-CM | POA: Diagnosis not present

## 2022-11-21 DIAGNOSIS — L97312 Non-pressure chronic ulcer of right ankle with fat layer exposed: Secondary | ICD-10-CM | POA: Diagnosis not present

## 2022-11-21 DIAGNOSIS — M810 Age-related osteoporosis without current pathological fracture: Secondary | ICD-10-CM | POA: Diagnosis not present

## 2022-11-21 DIAGNOSIS — I87311 Chronic venous hypertension (idiopathic) with ulcer of right lower extremity: Secondary | ICD-10-CM | POA: Diagnosis not present

## 2022-11-21 DIAGNOSIS — M1990 Unspecified osteoarthritis, unspecified site: Secondary | ICD-10-CM | POA: Diagnosis not present

## 2022-11-21 DIAGNOSIS — Z48817 Encounter for surgical aftercare following surgery on the skin and subcutaneous tissue: Secondary | ICD-10-CM | POA: Diagnosis not present

## 2022-11-21 DIAGNOSIS — Z7401 Bed confinement status: Secondary | ICD-10-CM | POA: Diagnosis not present

## 2022-11-21 DIAGNOSIS — I119 Hypertensive heart disease without heart failure: Secondary | ICD-10-CM | POA: Diagnosis not present

## 2022-11-21 DIAGNOSIS — R531 Weakness: Secondary | ICD-10-CM | POA: Diagnosis not present

## 2022-11-21 DIAGNOSIS — M25561 Pain in right knee: Secondary | ICD-10-CM | POA: Diagnosis not present

## 2022-11-21 DIAGNOSIS — I87313 Chronic venous hypertension (idiopathic) with ulcer of bilateral lower extremity: Secondary | ICD-10-CM | POA: Diagnosis not present

## 2022-11-21 DIAGNOSIS — M130 Polyarthritis, unspecified: Secondary | ICD-10-CM | POA: Diagnosis not present

## 2022-11-21 DIAGNOSIS — R5381 Other malaise: Secondary | ICD-10-CM | POA: Diagnosis not present

## 2022-11-21 DIAGNOSIS — I10 Essential (primary) hypertension: Secondary | ICD-10-CM | POA: Diagnosis not present

## 2022-11-21 DIAGNOSIS — G894 Chronic pain syndrome: Secondary | ICD-10-CM | POA: Diagnosis not present

## 2022-11-21 DIAGNOSIS — I87312 Chronic venous hypertension (idiopathic) with ulcer of left lower extremity: Secondary | ICD-10-CM | POA: Diagnosis not present

## 2022-11-21 DIAGNOSIS — L97822 Non-pressure chronic ulcer of other part of left lower leg with fat layer exposed: Secondary | ICD-10-CM | POA: Diagnosis not present

## 2022-11-23 DIAGNOSIS — R5381 Other malaise: Secondary | ICD-10-CM | POA: Diagnosis not present

## 2022-11-23 DIAGNOSIS — I890 Lymphedema, not elsewhere classified: Secondary | ICD-10-CM | POA: Diagnosis not present

## 2022-11-23 DIAGNOSIS — I10 Essential (primary) hypertension: Secondary | ICD-10-CM | POA: Diagnosis not present

## 2022-11-23 DIAGNOSIS — M1990 Unspecified osteoarthritis, unspecified site: Secondary | ICD-10-CM | POA: Diagnosis not present

## 2022-11-23 DIAGNOSIS — I87313 Chronic venous hypertension (idiopathic) with ulcer of bilateral lower extremity: Secondary | ICD-10-CM | POA: Diagnosis not present

## 2022-11-29 DIAGNOSIS — I890 Lymphedema, not elsewhere classified: Secondary | ICD-10-CM | POA: Diagnosis not present

## 2022-11-29 DIAGNOSIS — L97822 Non-pressure chronic ulcer of other part of left lower leg with fat layer exposed: Secondary | ICD-10-CM | POA: Diagnosis not present

## 2022-11-29 DIAGNOSIS — I87312 Chronic venous hypertension (idiopathic) with ulcer of left lower extremity: Secondary | ICD-10-CM | POA: Diagnosis not present

## 2022-12-01 DIAGNOSIS — R5381 Other malaise: Secondary | ICD-10-CM | POA: Diagnosis not present

## 2022-12-01 DIAGNOSIS — I890 Lymphedema, not elsewhere classified: Secondary | ICD-10-CM | POA: Diagnosis not present

## 2022-12-01 DIAGNOSIS — I10 Essential (primary) hypertension: Secondary | ICD-10-CM | POA: Diagnosis not present

## 2022-12-01 DIAGNOSIS — I87313 Chronic venous hypertension (idiopathic) with ulcer of bilateral lower extremity: Secondary | ICD-10-CM | POA: Diagnosis not present

## 2022-12-01 DIAGNOSIS — M1990 Unspecified osteoarthritis, unspecified site: Secondary | ICD-10-CM | POA: Diagnosis not present

## 2022-12-06 DIAGNOSIS — I87312 Chronic venous hypertension (idiopathic) with ulcer of left lower extremity: Secondary | ICD-10-CM | POA: Diagnosis not present

## 2022-12-06 DIAGNOSIS — I890 Lymphedema, not elsewhere classified: Secondary | ICD-10-CM | POA: Diagnosis not present

## 2022-12-06 DIAGNOSIS — L97822 Non-pressure chronic ulcer of other part of left lower leg with fat layer exposed: Secondary | ICD-10-CM | POA: Diagnosis not present

## 2022-12-13 DIAGNOSIS — I890 Lymphedema, not elsewhere classified: Secondary | ICD-10-CM | POA: Diagnosis not present

## 2022-12-13 DIAGNOSIS — I87312 Chronic venous hypertension (idiopathic) with ulcer of left lower extremity: Secondary | ICD-10-CM | POA: Diagnosis not present

## 2022-12-13 DIAGNOSIS — L97822 Non-pressure chronic ulcer of other part of left lower leg with fat layer exposed: Secondary | ICD-10-CM | POA: Diagnosis not present

## 2022-12-18 DIAGNOSIS — I87313 Chronic venous hypertension (idiopathic) with ulcer of bilateral lower extremity: Secondary | ICD-10-CM | POA: Diagnosis not present

## 2022-12-18 DIAGNOSIS — R5381 Other malaise: Secondary | ICD-10-CM | POA: Diagnosis not present

## 2022-12-18 DIAGNOSIS — I10 Essential (primary) hypertension: Secondary | ICD-10-CM | POA: Diagnosis not present

## 2022-12-18 DIAGNOSIS — M1990 Unspecified osteoarthritis, unspecified site: Secondary | ICD-10-CM | POA: Diagnosis not present

## 2022-12-18 DIAGNOSIS — I890 Lymphedema, not elsewhere classified: Secondary | ICD-10-CM | POA: Diagnosis not present

## 2022-12-20 DIAGNOSIS — L97822 Non-pressure chronic ulcer of other part of left lower leg with fat layer exposed: Secondary | ICD-10-CM | POA: Diagnosis not present

## 2022-12-20 DIAGNOSIS — I890 Lymphedema, not elsewhere classified: Secondary | ICD-10-CM | POA: Diagnosis not present

## 2022-12-20 DIAGNOSIS — I87312 Chronic venous hypertension (idiopathic) with ulcer of left lower extremity: Secondary | ICD-10-CM | POA: Diagnosis not present

## 2022-12-27 DIAGNOSIS — I87311 Chronic venous hypertension (idiopathic) with ulcer of right lower extremity: Secondary | ICD-10-CM | POA: Diagnosis not present

## 2022-12-27 DIAGNOSIS — L97822 Non-pressure chronic ulcer of other part of left lower leg with fat layer exposed: Secondary | ICD-10-CM | POA: Diagnosis not present

## 2022-12-27 DIAGNOSIS — I87312 Chronic venous hypertension (idiopathic) with ulcer of left lower extremity: Secondary | ICD-10-CM | POA: Diagnosis not present

## 2022-12-27 DIAGNOSIS — L97312 Non-pressure chronic ulcer of right ankle with fat layer exposed: Secondary | ICD-10-CM | POA: Diagnosis not present

## 2023-01-03 DIAGNOSIS — L97312 Non-pressure chronic ulcer of right ankle with fat layer exposed: Secondary | ICD-10-CM | POA: Diagnosis not present

## 2023-01-03 DIAGNOSIS — L97822 Non-pressure chronic ulcer of other part of left lower leg with fat layer exposed: Secondary | ICD-10-CM | POA: Diagnosis not present

## 2023-01-03 DIAGNOSIS — I890 Lymphedema, not elsewhere classified: Secondary | ICD-10-CM | POA: Diagnosis not present

## 2023-01-03 DIAGNOSIS — I87311 Chronic venous hypertension (idiopathic) with ulcer of right lower extremity: Secondary | ICD-10-CM | POA: Diagnosis not present

## 2023-01-03 DIAGNOSIS — I87312 Chronic venous hypertension (idiopathic) with ulcer of left lower extremity: Secondary | ICD-10-CM | POA: Diagnosis not present

## 2023-01-05 DIAGNOSIS — M1990 Unspecified osteoarthritis, unspecified site: Secondary | ICD-10-CM | POA: Diagnosis not present

## 2023-01-05 DIAGNOSIS — I87313 Chronic venous hypertension (idiopathic) with ulcer of bilateral lower extremity: Secondary | ICD-10-CM | POA: Diagnosis not present

## 2023-01-05 DIAGNOSIS — I10 Essential (primary) hypertension: Secondary | ICD-10-CM | POA: Diagnosis not present

## 2023-01-05 DIAGNOSIS — R5381 Other malaise: Secondary | ICD-10-CM | POA: Diagnosis not present

## 2023-01-05 DIAGNOSIS — I890 Lymphedema, not elsewhere classified: Secondary | ICD-10-CM | POA: Diagnosis not present

## 2023-01-11 DIAGNOSIS — I872 Venous insufficiency (chronic) (peripheral): Secondary | ICD-10-CM | POA: Diagnosis not present

## 2023-01-11 DIAGNOSIS — I10 Essential (primary) hypertension: Secondary | ICD-10-CM | POA: Diagnosis not present

## 2023-01-11 DIAGNOSIS — M170 Bilateral primary osteoarthritis of knee: Secondary | ICD-10-CM | POA: Diagnosis not present

## 2023-01-11 DIAGNOSIS — I878 Other specified disorders of veins: Secondary | ICD-10-CM | POA: Diagnosis not present

## 2023-01-16 DIAGNOSIS — I890 Lymphedema, not elsewhere classified: Secondary | ICD-10-CM | POA: Diagnosis not present

## 2023-01-16 DIAGNOSIS — I87313 Chronic venous hypertension (idiopathic) with ulcer of bilateral lower extremity: Secondary | ICD-10-CM | POA: Diagnosis not present

## 2023-01-16 DIAGNOSIS — L97822 Non-pressure chronic ulcer of other part of left lower leg with fat layer exposed: Secondary | ICD-10-CM | POA: Diagnosis not present

## 2023-01-16 DIAGNOSIS — Z556 Problems related to health literacy: Secondary | ICD-10-CM | POA: Diagnosis not present

## 2023-01-16 DIAGNOSIS — M810 Age-related osteoporosis without current pathological fracture: Secondary | ICD-10-CM | POA: Diagnosis not present

## 2023-01-16 DIAGNOSIS — Z48817 Encounter for surgical aftercare following surgery on the skin and subcutaneous tissue: Secondary | ICD-10-CM | POA: Diagnosis not present

## 2023-01-16 DIAGNOSIS — L97812 Non-pressure chronic ulcer of other part of right lower leg with fat layer exposed: Secondary | ICD-10-CM | POA: Diagnosis not present

## 2023-01-18 DIAGNOSIS — I87313 Chronic venous hypertension (idiopathic) with ulcer of bilateral lower extremity: Secondary | ICD-10-CM | POA: Diagnosis not present

## 2023-01-18 DIAGNOSIS — Z48817 Encounter for surgical aftercare following surgery on the skin and subcutaneous tissue: Secondary | ICD-10-CM | POA: Diagnosis not present

## 2023-01-18 DIAGNOSIS — L97822 Non-pressure chronic ulcer of other part of left lower leg with fat layer exposed: Secondary | ICD-10-CM | POA: Diagnosis not present

## 2023-01-18 DIAGNOSIS — I890 Lymphedema, not elsewhere classified: Secondary | ICD-10-CM | POA: Diagnosis not present

## 2023-01-18 DIAGNOSIS — M810 Age-related osteoporosis without current pathological fracture: Secondary | ICD-10-CM | POA: Diagnosis not present

## 2023-01-18 DIAGNOSIS — L97812 Non-pressure chronic ulcer of other part of right lower leg with fat layer exposed: Secondary | ICD-10-CM | POA: Diagnosis not present

## 2023-01-20 DIAGNOSIS — L97812 Non-pressure chronic ulcer of other part of right lower leg with fat layer exposed: Secondary | ICD-10-CM | POA: Diagnosis not present

## 2023-01-20 DIAGNOSIS — I890 Lymphedema, not elsewhere classified: Secondary | ICD-10-CM | POA: Diagnosis not present

## 2023-01-20 DIAGNOSIS — I87313 Chronic venous hypertension (idiopathic) with ulcer of bilateral lower extremity: Secondary | ICD-10-CM | POA: Diagnosis not present

## 2023-01-20 DIAGNOSIS — M810 Age-related osteoporosis without current pathological fracture: Secondary | ICD-10-CM | POA: Diagnosis not present

## 2023-01-20 DIAGNOSIS — Z48817 Encounter for surgical aftercare following surgery on the skin and subcutaneous tissue: Secondary | ICD-10-CM | POA: Diagnosis not present

## 2023-01-20 DIAGNOSIS — L97822 Non-pressure chronic ulcer of other part of left lower leg with fat layer exposed: Secondary | ICD-10-CM | POA: Diagnosis not present

## 2023-01-22 DIAGNOSIS — L97822 Non-pressure chronic ulcer of other part of left lower leg with fat layer exposed: Secondary | ICD-10-CM | POA: Diagnosis not present

## 2023-01-22 DIAGNOSIS — Z48817 Encounter for surgical aftercare following surgery on the skin and subcutaneous tissue: Secondary | ICD-10-CM | POA: Diagnosis not present

## 2023-01-22 DIAGNOSIS — M810 Age-related osteoporosis without current pathological fracture: Secondary | ICD-10-CM | POA: Diagnosis not present

## 2023-01-22 DIAGNOSIS — L97812 Non-pressure chronic ulcer of other part of right lower leg with fat layer exposed: Secondary | ICD-10-CM | POA: Diagnosis not present

## 2023-01-22 DIAGNOSIS — I890 Lymphedema, not elsewhere classified: Secondary | ICD-10-CM | POA: Diagnosis not present

## 2023-01-22 DIAGNOSIS — I87313 Chronic venous hypertension (idiopathic) with ulcer of bilateral lower extremity: Secondary | ICD-10-CM | POA: Diagnosis not present

## 2023-01-23 DIAGNOSIS — L97822 Non-pressure chronic ulcer of other part of left lower leg with fat layer exposed: Secondary | ICD-10-CM | POA: Diagnosis not present

## 2023-01-23 DIAGNOSIS — I87313 Chronic venous hypertension (idiopathic) with ulcer of bilateral lower extremity: Secondary | ICD-10-CM | POA: Diagnosis not present

## 2023-01-23 DIAGNOSIS — M810 Age-related osteoporosis without current pathological fracture: Secondary | ICD-10-CM | POA: Diagnosis not present

## 2023-01-23 DIAGNOSIS — L97812 Non-pressure chronic ulcer of other part of right lower leg with fat layer exposed: Secondary | ICD-10-CM | POA: Diagnosis not present

## 2023-01-23 DIAGNOSIS — Z48817 Encounter for surgical aftercare following surgery on the skin and subcutaneous tissue: Secondary | ICD-10-CM | POA: Diagnosis not present

## 2023-01-23 DIAGNOSIS — I890 Lymphedema, not elsewhere classified: Secondary | ICD-10-CM | POA: Diagnosis not present

## 2023-01-26 DIAGNOSIS — L97812 Non-pressure chronic ulcer of other part of right lower leg with fat layer exposed: Secondary | ICD-10-CM | POA: Diagnosis not present

## 2023-01-26 DIAGNOSIS — Z48817 Encounter for surgical aftercare following surgery on the skin and subcutaneous tissue: Secondary | ICD-10-CM | POA: Diagnosis not present

## 2023-01-26 DIAGNOSIS — I890 Lymphedema, not elsewhere classified: Secondary | ICD-10-CM | POA: Diagnosis not present

## 2023-01-26 DIAGNOSIS — L97822 Non-pressure chronic ulcer of other part of left lower leg with fat layer exposed: Secondary | ICD-10-CM | POA: Diagnosis not present

## 2023-01-26 DIAGNOSIS — I87313 Chronic venous hypertension (idiopathic) with ulcer of bilateral lower extremity: Secondary | ICD-10-CM | POA: Diagnosis not present

## 2023-01-26 DIAGNOSIS — M810 Age-related osteoporosis without current pathological fracture: Secondary | ICD-10-CM | POA: Diagnosis not present

## 2023-01-31 DIAGNOSIS — I87313 Chronic venous hypertension (idiopathic) with ulcer of bilateral lower extremity: Secondary | ICD-10-CM | POA: Diagnosis not present

## 2023-01-31 DIAGNOSIS — M810 Age-related osteoporosis without current pathological fracture: Secondary | ICD-10-CM | POA: Diagnosis not present

## 2023-01-31 DIAGNOSIS — I890 Lymphedema, not elsewhere classified: Secondary | ICD-10-CM | POA: Diagnosis not present

## 2023-01-31 DIAGNOSIS — Z48817 Encounter for surgical aftercare following surgery on the skin and subcutaneous tissue: Secondary | ICD-10-CM | POA: Diagnosis not present

## 2023-01-31 DIAGNOSIS — L97812 Non-pressure chronic ulcer of other part of right lower leg with fat layer exposed: Secondary | ICD-10-CM | POA: Diagnosis not present

## 2023-01-31 DIAGNOSIS — L97822 Non-pressure chronic ulcer of other part of left lower leg with fat layer exposed: Secondary | ICD-10-CM | POA: Diagnosis not present

## 2023-02-02 DIAGNOSIS — L97812 Non-pressure chronic ulcer of other part of right lower leg with fat layer exposed: Secondary | ICD-10-CM | POA: Diagnosis not present

## 2023-02-02 DIAGNOSIS — M810 Age-related osteoporosis without current pathological fracture: Secondary | ICD-10-CM | POA: Diagnosis not present

## 2023-02-02 DIAGNOSIS — L97822 Non-pressure chronic ulcer of other part of left lower leg with fat layer exposed: Secondary | ICD-10-CM | POA: Diagnosis not present

## 2023-02-02 DIAGNOSIS — I87313 Chronic venous hypertension (idiopathic) with ulcer of bilateral lower extremity: Secondary | ICD-10-CM | POA: Diagnosis not present

## 2023-02-02 DIAGNOSIS — I890 Lymphedema, not elsewhere classified: Secondary | ICD-10-CM | POA: Diagnosis not present

## 2023-02-02 DIAGNOSIS — Z48817 Encounter for surgical aftercare following surgery on the skin and subcutaneous tissue: Secondary | ICD-10-CM | POA: Diagnosis not present

## 2023-02-05 DIAGNOSIS — M810 Age-related osteoporosis without current pathological fracture: Secondary | ICD-10-CM | POA: Diagnosis not present

## 2023-02-05 DIAGNOSIS — I87313 Chronic venous hypertension (idiopathic) with ulcer of bilateral lower extremity: Secondary | ICD-10-CM | POA: Diagnosis not present

## 2023-02-05 DIAGNOSIS — L97812 Non-pressure chronic ulcer of other part of right lower leg with fat layer exposed: Secondary | ICD-10-CM | POA: Diagnosis not present

## 2023-02-05 DIAGNOSIS — Z48817 Encounter for surgical aftercare following surgery on the skin and subcutaneous tissue: Secondary | ICD-10-CM | POA: Diagnosis not present

## 2023-02-05 DIAGNOSIS — I890 Lymphedema, not elsewhere classified: Secondary | ICD-10-CM | POA: Diagnosis not present

## 2023-02-05 DIAGNOSIS — L97822 Non-pressure chronic ulcer of other part of left lower leg with fat layer exposed: Secondary | ICD-10-CM | POA: Diagnosis not present

## 2023-02-07 DIAGNOSIS — I87313 Chronic venous hypertension (idiopathic) with ulcer of bilateral lower extremity: Secondary | ICD-10-CM | POA: Diagnosis not present

## 2023-02-07 DIAGNOSIS — L97812 Non-pressure chronic ulcer of other part of right lower leg with fat layer exposed: Secondary | ICD-10-CM | POA: Diagnosis not present

## 2023-02-07 DIAGNOSIS — Z48817 Encounter for surgical aftercare following surgery on the skin and subcutaneous tissue: Secondary | ICD-10-CM | POA: Diagnosis not present

## 2023-02-07 DIAGNOSIS — L97822 Non-pressure chronic ulcer of other part of left lower leg with fat layer exposed: Secondary | ICD-10-CM | POA: Diagnosis not present

## 2023-02-07 DIAGNOSIS — M810 Age-related osteoporosis without current pathological fracture: Secondary | ICD-10-CM | POA: Diagnosis not present

## 2023-02-07 DIAGNOSIS — I890 Lymphedema, not elsewhere classified: Secondary | ICD-10-CM | POA: Diagnosis not present

## 2023-02-09 DIAGNOSIS — M810 Age-related osteoporosis without current pathological fracture: Secondary | ICD-10-CM | POA: Diagnosis not present

## 2023-02-09 DIAGNOSIS — Z48817 Encounter for surgical aftercare following surgery on the skin and subcutaneous tissue: Secondary | ICD-10-CM | POA: Diagnosis not present

## 2023-02-09 DIAGNOSIS — I890 Lymphedema, not elsewhere classified: Secondary | ICD-10-CM | POA: Diagnosis not present

## 2023-02-09 DIAGNOSIS — L97812 Non-pressure chronic ulcer of other part of right lower leg with fat layer exposed: Secondary | ICD-10-CM | POA: Diagnosis not present

## 2023-02-09 DIAGNOSIS — I87313 Chronic venous hypertension (idiopathic) with ulcer of bilateral lower extremity: Secondary | ICD-10-CM | POA: Diagnosis not present

## 2023-02-09 DIAGNOSIS — L97822 Non-pressure chronic ulcer of other part of left lower leg with fat layer exposed: Secondary | ICD-10-CM | POA: Diagnosis not present

## 2023-02-12 DIAGNOSIS — I890 Lymphedema, not elsewhere classified: Secondary | ICD-10-CM | POA: Diagnosis not present

## 2023-02-12 DIAGNOSIS — L97822 Non-pressure chronic ulcer of other part of left lower leg with fat layer exposed: Secondary | ICD-10-CM | POA: Diagnosis not present

## 2023-02-12 DIAGNOSIS — M810 Age-related osteoporosis without current pathological fracture: Secondary | ICD-10-CM | POA: Diagnosis not present

## 2023-02-12 DIAGNOSIS — Z48817 Encounter for surgical aftercare following surgery on the skin and subcutaneous tissue: Secondary | ICD-10-CM | POA: Diagnosis not present

## 2023-02-12 DIAGNOSIS — I87313 Chronic venous hypertension (idiopathic) with ulcer of bilateral lower extremity: Secondary | ICD-10-CM | POA: Diagnosis not present

## 2023-02-12 DIAGNOSIS — L97812 Non-pressure chronic ulcer of other part of right lower leg with fat layer exposed: Secondary | ICD-10-CM | POA: Diagnosis not present

## 2023-02-14 DIAGNOSIS — I890 Lymphedema, not elsewhere classified: Secondary | ICD-10-CM | POA: Diagnosis not present

## 2023-02-14 DIAGNOSIS — L97822 Non-pressure chronic ulcer of other part of left lower leg with fat layer exposed: Secondary | ICD-10-CM | POA: Diagnosis not present

## 2023-02-14 DIAGNOSIS — I87313 Chronic venous hypertension (idiopathic) with ulcer of bilateral lower extremity: Secondary | ICD-10-CM | POA: Diagnosis not present

## 2023-02-14 DIAGNOSIS — M810 Age-related osteoporosis without current pathological fracture: Secondary | ICD-10-CM | POA: Diagnosis not present

## 2023-02-14 DIAGNOSIS — Z48817 Encounter for surgical aftercare following surgery on the skin and subcutaneous tissue: Secondary | ICD-10-CM | POA: Diagnosis not present

## 2023-02-14 DIAGNOSIS — L97812 Non-pressure chronic ulcer of other part of right lower leg with fat layer exposed: Secondary | ICD-10-CM | POA: Diagnosis not present

## 2023-02-15 DIAGNOSIS — Z48817 Encounter for surgical aftercare following surgery on the skin and subcutaneous tissue: Secondary | ICD-10-CM | POA: Diagnosis not present

## 2023-02-15 DIAGNOSIS — I890 Lymphedema, not elsewhere classified: Secondary | ICD-10-CM | POA: Diagnosis not present

## 2023-02-15 DIAGNOSIS — L97812 Non-pressure chronic ulcer of other part of right lower leg with fat layer exposed: Secondary | ICD-10-CM | POA: Diagnosis not present

## 2023-02-15 DIAGNOSIS — Z556 Problems related to health literacy: Secondary | ICD-10-CM | POA: Diagnosis not present

## 2023-02-15 DIAGNOSIS — M810 Age-related osteoporosis without current pathological fracture: Secondary | ICD-10-CM | POA: Diagnosis not present

## 2023-02-15 DIAGNOSIS — I87313 Chronic venous hypertension (idiopathic) with ulcer of bilateral lower extremity: Secondary | ICD-10-CM | POA: Diagnosis not present

## 2023-02-15 DIAGNOSIS — L97822 Non-pressure chronic ulcer of other part of left lower leg with fat layer exposed: Secondary | ICD-10-CM | POA: Diagnosis not present

## 2023-02-16 DIAGNOSIS — M810 Age-related osteoporosis without current pathological fracture: Secondary | ICD-10-CM | POA: Diagnosis not present

## 2023-02-16 DIAGNOSIS — I890 Lymphedema, not elsewhere classified: Secondary | ICD-10-CM | POA: Diagnosis not present

## 2023-02-16 DIAGNOSIS — L97822 Non-pressure chronic ulcer of other part of left lower leg with fat layer exposed: Secondary | ICD-10-CM | POA: Diagnosis not present

## 2023-02-16 DIAGNOSIS — I87313 Chronic venous hypertension (idiopathic) with ulcer of bilateral lower extremity: Secondary | ICD-10-CM | POA: Diagnosis not present

## 2023-02-16 DIAGNOSIS — L97812 Non-pressure chronic ulcer of other part of right lower leg with fat layer exposed: Secondary | ICD-10-CM | POA: Diagnosis not present

## 2023-02-16 DIAGNOSIS — Z48817 Encounter for surgical aftercare following surgery on the skin and subcutaneous tissue: Secondary | ICD-10-CM | POA: Diagnosis not present

## 2023-02-19 DIAGNOSIS — L97822 Non-pressure chronic ulcer of other part of left lower leg with fat layer exposed: Secondary | ICD-10-CM | POA: Diagnosis not present

## 2023-02-19 DIAGNOSIS — M810 Age-related osteoporosis without current pathological fracture: Secondary | ICD-10-CM | POA: Diagnosis not present

## 2023-02-19 DIAGNOSIS — L97812 Non-pressure chronic ulcer of other part of right lower leg with fat layer exposed: Secondary | ICD-10-CM | POA: Diagnosis not present

## 2023-02-19 DIAGNOSIS — Z48817 Encounter for surgical aftercare following surgery on the skin and subcutaneous tissue: Secondary | ICD-10-CM | POA: Diagnosis not present

## 2023-02-19 DIAGNOSIS — I87313 Chronic venous hypertension (idiopathic) with ulcer of bilateral lower extremity: Secondary | ICD-10-CM | POA: Diagnosis not present

## 2023-02-19 DIAGNOSIS — I890 Lymphedema, not elsewhere classified: Secondary | ICD-10-CM | POA: Diagnosis not present

## 2023-02-20 DIAGNOSIS — L97812 Non-pressure chronic ulcer of other part of right lower leg with fat layer exposed: Secondary | ICD-10-CM | POA: Diagnosis not present

## 2023-02-20 DIAGNOSIS — Z48817 Encounter for surgical aftercare following surgery on the skin and subcutaneous tissue: Secondary | ICD-10-CM | POA: Diagnosis not present

## 2023-02-20 DIAGNOSIS — I87313 Chronic venous hypertension (idiopathic) with ulcer of bilateral lower extremity: Secondary | ICD-10-CM | POA: Diagnosis not present

## 2023-02-20 DIAGNOSIS — M810 Age-related osteoporosis without current pathological fracture: Secondary | ICD-10-CM | POA: Diagnosis not present

## 2023-02-20 DIAGNOSIS — I890 Lymphedema, not elsewhere classified: Secondary | ICD-10-CM | POA: Diagnosis not present

## 2023-02-20 DIAGNOSIS — L97822 Non-pressure chronic ulcer of other part of left lower leg with fat layer exposed: Secondary | ICD-10-CM | POA: Diagnosis not present

## 2023-02-21 DIAGNOSIS — L84 Corns and callosities: Secondary | ICD-10-CM | POA: Diagnosis not present

## 2023-02-21 DIAGNOSIS — G8929 Other chronic pain: Secondary | ICD-10-CM | POA: Diagnosis not present

## 2023-02-21 DIAGNOSIS — L97329 Non-pressure chronic ulcer of left ankle with unspecified severity: Secondary | ICD-10-CM | POA: Diagnosis not present

## 2023-02-21 DIAGNOSIS — L97321 Non-pressure chronic ulcer of left ankle limited to breakdown of skin: Secondary | ICD-10-CM | POA: Diagnosis not present

## 2023-02-21 DIAGNOSIS — Z48817 Encounter for surgical aftercare following surgery on the skin and subcutaneous tissue: Secondary | ICD-10-CM | POA: Diagnosis not present

## 2023-02-21 DIAGNOSIS — S91001D Unspecified open wound, right ankle, subsequent encounter: Secondary | ICD-10-CM | POA: Diagnosis not present

## 2023-02-21 DIAGNOSIS — I872 Venous insufficiency (chronic) (peripheral): Secondary | ICD-10-CM | POA: Diagnosis not present

## 2023-02-21 DIAGNOSIS — M25569 Pain in unspecified knee: Secondary | ICD-10-CM | POA: Diagnosis not present

## 2023-02-21 DIAGNOSIS — I96 Gangrene, not elsewhere classified: Secondary | ICD-10-CM | POA: Diagnosis not present

## 2023-02-21 DIAGNOSIS — I10 Essential (primary) hypertension: Secondary | ICD-10-CM | POA: Diagnosis not present

## 2023-02-21 DIAGNOSIS — M810 Age-related osteoporosis without current pathological fracture: Secondary | ICD-10-CM | POA: Diagnosis not present

## 2023-02-21 DIAGNOSIS — I890 Lymphedema, not elsewhere classified: Secondary | ICD-10-CM | POA: Diagnosis not present

## 2023-02-21 DIAGNOSIS — S81801A Unspecified open wound, right lower leg, initial encounter: Secondary | ICD-10-CM | POA: Diagnosis not present

## 2023-02-21 DIAGNOSIS — I878 Other specified disorders of veins: Secondary | ICD-10-CM | POA: Diagnosis not present

## 2023-02-23 DIAGNOSIS — I87313 Chronic venous hypertension (idiopathic) with ulcer of bilateral lower extremity: Secondary | ICD-10-CM | POA: Diagnosis not present

## 2023-02-23 DIAGNOSIS — L97812 Non-pressure chronic ulcer of other part of right lower leg with fat layer exposed: Secondary | ICD-10-CM | POA: Diagnosis not present

## 2023-02-23 DIAGNOSIS — I890 Lymphedema, not elsewhere classified: Secondary | ICD-10-CM | POA: Diagnosis not present

## 2023-02-23 DIAGNOSIS — L97822 Non-pressure chronic ulcer of other part of left lower leg with fat layer exposed: Secondary | ICD-10-CM | POA: Diagnosis not present

## 2023-02-23 DIAGNOSIS — Z48817 Encounter for surgical aftercare following surgery on the skin and subcutaneous tissue: Secondary | ICD-10-CM | POA: Diagnosis not present

## 2023-02-23 DIAGNOSIS — M810 Age-related osteoporosis without current pathological fracture: Secondary | ICD-10-CM | POA: Diagnosis not present

## 2023-02-26 DIAGNOSIS — L97812 Non-pressure chronic ulcer of other part of right lower leg with fat layer exposed: Secondary | ICD-10-CM | POA: Diagnosis not present

## 2023-02-26 DIAGNOSIS — I890 Lymphedema, not elsewhere classified: Secondary | ICD-10-CM | POA: Diagnosis not present

## 2023-02-26 DIAGNOSIS — L97822 Non-pressure chronic ulcer of other part of left lower leg with fat layer exposed: Secondary | ICD-10-CM | POA: Diagnosis not present

## 2023-02-26 DIAGNOSIS — M810 Age-related osteoporosis without current pathological fracture: Secondary | ICD-10-CM | POA: Diagnosis not present

## 2023-02-26 DIAGNOSIS — Z48817 Encounter for surgical aftercare following surgery on the skin and subcutaneous tissue: Secondary | ICD-10-CM | POA: Diagnosis not present

## 2023-02-26 DIAGNOSIS — I87313 Chronic venous hypertension (idiopathic) with ulcer of bilateral lower extremity: Secondary | ICD-10-CM | POA: Diagnosis not present

## 2023-02-28 DIAGNOSIS — L97812 Non-pressure chronic ulcer of other part of right lower leg with fat layer exposed: Secondary | ICD-10-CM | POA: Diagnosis not present

## 2023-02-28 DIAGNOSIS — L97822 Non-pressure chronic ulcer of other part of left lower leg with fat layer exposed: Secondary | ICD-10-CM | POA: Diagnosis not present

## 2023-02-28 DIAGNOSIS — I87313 Chronic venous hypertension (idiopathic) with ulcer of bilateral lower extremity: Secondary | ICD-10-CM | POA: Diagnosis not present

## 2023-02-28 DIAGNOSIS — Z48817 Encounter for surgical aftercare following surgery on the skin and subcutaneous tissue: Secondary | ICD-10-CM | POA: Diagnosis not present

## 2023-02-28 DIAGNOSIS — M810 Age-related osteoporosis without current pathological fracture: Secondary | ICD-10-CM | POA: Diagnosis not present

## 2023-02-28 DIAGNOSIS — I890 Lymphedema, not elsewhere classified: Secondary | ICD-10-CM | POA: Diagnosis not present

## 2023-03-02 DIAGNOSIS — I87313 Chronic venous hypertension (idiopathic) with ulcer of bilateral lower extremity: Secondary | ICD-10-CM | POA: Diagnosis not present

## 2023-03-02 DIAGNOSIS — L97822 Non-pressure chronic ulcer of other part of left lower leg with fat layer exposed: Secondary | ICD-10-CM | POA: Diagnosis not present

## 2023-03-02 DIAGNOSIS — M810 Age-related osteoporosis without current pathological fracture: Secondary | ICD-10-CM | POA: Diagnosis not present

## 2023-03-02 DIAGNOSIS — I890 Lymphedema, not elsewhere classified: Secondary | ICD-10-CM | POA: Diagnosis not present

## 2023-03-02 DIAGNOSIS — L97812 Non-pressure chronic ulcer of other part of right lower leg with fat layer exposed: Secondary | ICD-10-CM | POA: Diagnosis not present

## 2023-03-02 DIAGNOSIS — Z48817 Encounter for surgical aftercare following surgery on the skin and subcutaneous tissue: Secondary | ICD-10-CM | POA: Diagnosis not present

## 2023-03-05 DIAGNOSIS — I87313 Chronic venous hypertension (idiopathic) with ulcer of bilateral lower extremity: Secondary | ICD-10-CM | POA: Diagnosis not present

## 2023-03-05 DIAGNOSIS — L97812 Non-pressure chronic ulcer of other part of right lower leg with fat layer exposed: Secondary | ICD-10-CM | POA: Diagnosis not present

## 2023-03-05 DIAGNOSIS — M810 Age-related osteoporosis without current pathological fracture: Secondary | ICD-10-CM | POA: Diagnosis not present

## 2023-03-05 DIAGNOSIS — L97822 Non-pressure chronic ulcer of other part of left lower leg with fat layer exposed: Secondary | ICD-10-CM | POA: Diagnosis not present

## 2023-03-05 DIAGNOSIS — I890 Lymphedema, not elsewhere classified: Secondary | ICD-10-CM | POA: Diagnosis not present

## 2023-03-05 DIAGNOSIS — Z48817 Encounter for surgical aftercare following surgery on the skin and subcutaneous tissue: Secondary | ICD-10-CM | POA: Diagnosis not present

## 2023-03-07 DIAGNOSIS — M810 Age-related osteoporosis without current pathological fracture: Secondary | ICD-10-CM | POA: Diagnosis not present

## 2023-03-07 DIAGNOSIS — L97822 Non-pressure chronic ulcer of other part of left lower leg with fat layer exposed: Secondary | ICD-10-CM | POA: Diagnosis not present

## 2023-03-07 DIAGNOSIS — L97812 Non-pressure chronic ulcer of other part of right lower leg with fat layer exposed: Secondary | ICD-10-CM | POA: Diagnosis not present

## 2023-03-07 DIAGNOSIS — I890 Lymphedema, not elsewhere classified: Secondary | ICD-10-CM | POA: Diagnosis not present

## 2023-03-07 DIAGNOSIS — Z48817 Encounter for surgical aftercare following surgery on the skin and subcutaneous tissue: Secondary | ICD-10-CM | POA: Diagnosis not present

## 2023-03-07 DIAGNOSIS — I87313 Chronic venous hypertension (idiopathic) with ulcer of bilateral lower extremity: Secondary | ICD-10-CM | POA: Diagnosis not present

## 2023-03-09 DIAGNOSIS — I890 Lymphedema, not elsewhere classified: Secondary | ICD-10-CM | POA: Diagnosis not present

## 2023-03-09 DIAGNOSIS — I87313 Chronic venous hypertension (idiopathic) with ulcer of bilateral lower extremity: Secondary | ICD-10-CM | POA: Diagnosis not present

## 2023-03-09 DIAGNOSIS — L97822 Non-pressure chronic ulcer of other part of left lower leg with fat layer exposed: Secondary | ICD-10-CM | POA: Diagnosis not present

## 2023-03-09 DIAGNOSIS — Z48817 Encounter for surgical aftercare following surgery on the skin and subcutaneous tissue: Secondary | ICD-10-CM | POA: Diagnosis not present

## 2023-03-09 DIAGNOSIS — L97812 Non-pressure chronic ulcer of other part of right lower leg with fat layer exposed: Secondary | ICD-10-CM | POA: Diagnosis not present

## 2023-03-09 DIAGNOSIS — M810 Age-related osteoporosis without current pathological fracture: Secondary | ICD-10-CM | POA: Diagnosis not present

## 2023-03-12 DIAGNOSIS — L97812 Non-pressure chronic ulcer of other part of right lower leg with fat layer exposed: Secondary | ICD-10-CM | POA: Diagnosis not present

## 2023-03-12 DIAGNOSIS — Z48817 Encounter for surgical aftercare following surgery on the skin and subcutaneous tissue: Secondary | ICD-10-CM | POA: Diagnosis not present

## 2023-03-12 DIAGNOSIS — L97822 Non-pressure chronic ulcer of other part of left lower leg with fat layer exposed: Secondary | ICD-10-CM | POA: Diagnosis not present

## 2023-03-12 DIAGNOSIS — I890 Lymphedema, not elsewhere classified: Secondary | ICD-10-CM | POA: Diagnosis not present

## 2023-03-12 DIAGNOSIS — M810 Age-related osteoporosis without current pathological fracture: Secondary | ICD-10-CM | POA: Diagnosis not present

## 2023-03-12 DIAGNOSIS — I87313 Chronic venous hypertension (idiopathic) with ulcer of bilateral lower extremity: Secondary | ICD-10-CM | POA: Diagnosis not present

## 2023-03-13 DIAGNOSIS — L84 Corns and callosities: Secondary | ICD-10-CM | POA: Diagnosis not present

## 2023-03-13 DIAGNOSIS — L600 Ingrowing nail: Secondary | ICD-10-CM | POA: Diagnosis not present

## 2023-03-13 DIAGNOSIS — M7742 Metatarsalgia, left foot: Secondary | ICD-10-CM | POA: Diagnosis not present

## 2023-03-13 DIAGNOSIS — M2041 Other hammer toe(s) (acquired), right foot: Secondary | ICD-10-CM | POA: Diagnosis not present

## 2023-03-13 DIAGNOSIS — M7741 Metatarsalgia, right foot: Secondary | ICD-10-CM | POA: Diagnosis not present

## 2023-03-13 DIAGNOSIS — I739 Peripheral vascular disease, unspecified: Secondary | ICD-10-CM | POA: Diagnosis not present

## 2023-03-13 DIAGNOSIS — B351 Tinea unguium: Secondary | ICD-10-CM | POA: Diagnosis not present

## 2023-03-13 DIAGNOSIS — M2042 Other hammer toe(s) (acquired), left foot: Secondary | ICD-10-CM | POA: Diagnosis not present

## 2023-03-14 DIAGNOSIS — Z48817 Encounter for surgical aftercare following surgery on the skin and subcutaneous tissue: Secondary | ICD-10-CM | POA: Diagnosis not present

## 2023-03-14 DIAGNOSIS — L97822 Non-pressure chronic ulcer of other part of left lower leg with fat layer exposed: Secondary | ICD-10-CM | POA: Diagnosis not present

## 2023-03-14 DIAGNOSIS — I890 Lymphedema, not elsewhere classified: Secondary | ICD-10-CM | POA: Diagnosis not present

## 2023-03-14 DIAGNOSIS — I87313 Chronic venous hypertension (idiopathic) with ulcer of bilateral lower extremity: Secondary | ICD-10-CM | POA: Diagnosis not present

## 2023-03-14 DIAGNOSIS — L97812 Non-pressure chronic ulcer of other part of right lower leg with fat layer exposed: Secondary | ICD-10-CM | POA: Diagnosis not present

## 2023-03-14 DIAGNOSIS — M810 Age-related osteoporosis without current pathological fracture: Secondary | ICD-10-CM | POA: Diagnosis not present

## 2023-03-16 DIAGNOSIS — Z48817 Encounter for surgical aftercare following surgery on the skin and subcutaneous tissue: Secondary | ICD-10-CM | POA: Diagnosis not present

## 2023-03-16 DIAGNOSIS — I87313 Chronic venous hypertension (idiopathic) with ulcer of bilateral lower extremity: Secondary | ICD-10-CM | POA: Diagnosis not present

## 2023-03-16 DIAGNOSIS — I890 Lymphedema, not elsewhere classified: Secondary | ICD-10-CM | POA: Diagnosis not present

## 2023-03-16 DIAGNOSIS — L97812 Non-pressure chronic ulcer of other part of right lower leg with fat layer exposed: Secondary | ICD-10-CM | POA: Diagnosis not present

## 2023-03-16 DIAGNOSIS — L97822 Non-pressure chronic ulcer of other part of left lower leg with fat layer exposed: Secondary | ICD-10-CM | POA: Diagnosis not present

## 2023-03-16 DIAGNOSIS — M810 Age-related osteoporosis without current pathological fracture: Secondary | ICD-10-CM | POA: Diagnosis not present

## 2023-03-17 DIAGNOSIS — Z556 Problems related to health literacy: Secondary | ICD-10-CM | POA: Diagnosis not present

## 2023-03-17 DIAGNOSIS — Z48817 Encounter for surgical aftercare following surgery on the skin and subcutaneous tissue: Secondary | ICD-10-CM | POA: Diagnosis not present

## 2023-03-17 DIAGNOSIS — M810 Age-related osteoporosis without current pathological fracture: Secondary | ICD-10-CM | POA: Diagnosis not present

## 2023-03-17 DIAGNOSIS — I87321 Chronic venous hypertension (idiopathic) with inflammation of right lower extremity: Secondary | ICD-10-CM | POA: Diagnosis not present

## 2023-03-17 DIAGNOSIS — I87312 Chronic venous hypertension (idiopathic) with ulcer of left lower extremity: Secondary | ICD-10-CM | POA: Diagnosis not present

## 2023-03-17 DIAGNOSIS — L97822 Non-pressure chronic ulcer of other part of left lower leg with fat layer exposed: Secondary | ICD-10-CM | POA: Diagnosis not present

## 2023-03-17 DIAGNOSIS — I890 Lymphedema, not elsewhere classified: Secondary | ICD-10-CM | POA: Diagnosis not present

## 2023-03-19 DIAGNOSIS — I890 Lymphedema, not elsewhere classified: Secondary | ICD-10-CM | POA: Diagnosis not present

## 2023-03-19 DIAGNOSIS — L97822 Non-pressure chronic ulcer of other part of left lower leg with fat layer exposed: Secondary | ICD-10-CM | POA: Diagnosis not present

## 2023-03-19 DIAGNOSIS — Z48817 Encounter for surgical aftercare following surgery on the skin and subcutaneous tissue: Secondary | ICD-10-CM | POA: Diagnosis not present

## 2023-03-19 DIAGNOSIS — M810 Age-related osteoporosis without current pathological fracture: Secondary | ICD-10-CM | POA: Diagnosis not present

## 2023-03-19 DIAGNOSIS — I87312 Chronic venous hypertension (idiopathic) with ulcer of left lower extremity: Secondary | ICD-10-CM | POA: Diagnosis not present

## 2023-03-19 DIAGNOSIS — I87321 Chronic venous hypertension (idiopathic) with inflammation of right lower extremity: Secondary | ICD-10-CM | POA: Diagnosis not present

## 2023-03-21 DIAGNOSIS — Z48817 Encounter for surgical aftercare following surgery on the skin and subcutaneous tissue: Secondary | ICD-10-CM | POA: Diagnosis not present

## 2023-03-21 DIAGNOSIS — I87321 Chronic venous hypertension (idiopathic) with inflammation of right lower extremity: Secondary | ICD-10-CM | POA: Diagnosis not present

## 2023-03-21 DIAGNOSIS — M810 Age-related osteoporosis without current pathological fracture: Secondary | ICD-10-CM | POA: Diagnosis not present

## 2023-03-21 DIAGNOSIS — L97822 Non-pressure chronic ulcer of other part of left lower leg with fat layer exposed: Secondary | ICD-10-CM | POA: Diagnosis not present

## 2023-03-21 DIAGNOSIS — I87312 Chronic venous hypertension (idiopathic) with ulcer of left lower extremity: Secondary | ICD-10-CM | POA: Diagnosis not present

## 2023-03-21 DIAGNOSIS — I890 Lymphedema, not elsewhere classified: Secondary | ICD-10-CM | POA: Diagnosis not present

## 2023-03-23 DIAGNOSIS — I87312 Chronic venous hypertension (idiopathic) with ulcer of left lower extremity: Secondary | ICD-10-CM | POA: Diagnosis not present

## 2023-03-23 DIAGNOSIS — I890 Lymphedema, not elsewhere classified: Secondary | ICD-10-CM | POA: Diagnosis not present

## 2023-03-23 DIAGNOSIS — I87321 Chronic venous hypertension (idiopathic) with inflammation of right lower extremity: Secondary | ICD-10-CM | POA: Diagnosis not present

## 2023-03-23 DIAGNOSIS — Z48817 Encounter for surgical aftercare following surgery on the skin and subcutaneous tissue: Secondary | ICD-10-CM | POA: Diagnosis not present

## 2023-03-23 DIAGNOSIS — L97822 Non-pressure chronic ulcer of other part of left lower leg with fat layer exposed: Secondary | ICD-10-CM | POA: Diagnosis not present

## 2023-03-23 DIAGNOSIS — M810 Age-related osteoporosis without current pathological fracture: Secondary | ICD-10-CM | POA: Diagnosis not present

## 2023-03-26 DIAGNOSIS — I87312 Chronic venous hypertension (idiopathic) with ulcer of left lower extremity: Secondary | ICD-10-CM | POA: Diagnosis not present

## 2023-03-26 DIAGNOSIS — M810 Age-related osteoporosis without current pathological fracture: Secondary | ICD-10-CM | POA: Diagnosis not present

## 2023-03-26 DIAGNOSIS — L97822 Non-pressure chronic ulcer of other part of left lower leg with fat layer exposed: Secondary | ICD-10-CM | POA: Diagnosis not present

## 2023-03-26 DIAGNOSIS — I890 Lymphedema, not elsewhere classified: Secondary | ICD-10-CM | POA: Diagnosis not present

## 2023-03-26 DIAGNOSIS — I87321 Chronic venous hypertension (idiopathic) with inflammation of right lower extremity: Secondary | ICD-10-CM | POA: Diagnosis not present

## 2023-03-26 DIAGNOSIS — Z48817 Encounter for surgical aftercare following surgery on the skin and subcutaneous tissue: Secondary | ICD-10-CM | POA: Diagnosis not present

## 2023-03-28 DIAGNOSIS — Z48817 Encounter for surgical aftercare following surgery on the skin and subcutaneous tissue: Secondary | ICD-10-CM | POA: Diagnosis not present

## 2023-03-28 DIAGNOSIS — I890 Lymphedema, not elsewhere classified: Secondary | ICD-10-CM | POA: Diagnosis not present

## 2023-03-28 DIAGNOSIS — I87312 Chronic venous hypertension (idiopathic) with ulcer of left lower extremity: Secondary | ICD-10-CM | POA: Diagnosis not present

## 2023-03-28 DIAGNOSIS — I87321 Chronic venous hypertension (idiopathic) with inflammation of right lower extremity: Secondary | ICD-10-CM | POA: Diagnosis not present

## 2023-03-28 DIAGNOSIS — L97822 Non-pressure chronic ulcer of other part of left lower leg with fat layer exposed: Secondary | ICD-10-CM | POA: Diagnosis not present

## 2023-03-28 DIAGNOSIS — M810 Age-related osteoporosis without current pathological fracture: Secondary | ICD-10-CM | POA: Diagnosis not present

## 2023-03-30 DIAGNOSIS — M810 Age-related osteoporosis without current pathological fracture: Secondary | ICD-10-CM | POA: Diagnosis not present

## 2023-03-30 DIAGNOSIS — Z48817 Encounter for surgical aftercare following surgery on the skin and subcutaneous tissue: Secondary | ICD-10-CM | POA: Diagnosis not present

## 2023-03-30 DIAGNOSIS — I890 Lymphedema, not elsewhere classified: Secondary | ICD-10-CM | POA: Diagnosis not present

## 2023-03-30 DIAGNOSIS — I87312 Chronic venous hypertension (idiopathic) with ulcer of left lower extremity: Secondary | ICD-10-CM | POA: Diagnosis not present

## 2023-03-30 DIAGNOSIS — L97822 Non-pressure chronic ulcer of other part of left lower leg with fat layer exposed: Secondary | ICD-10-CM | POA: Diagnosis not present

## 2023-03-30 DIAGNOSIS — I87321 Chronic venous hypertension (idiopathic) with inflammation of right lower extremity: Secondary | ICD-10-CM | POA: Diagnosis not present

## 2023-04-02 DIAGNOSIS — I890 Lymphedema, not elsewhere classified: Secondary | ICD-10-CM | POA: Diagnosis not present

## 2023-04-02 DIAGNOSIS — M810 Age-related osteoporosis without current pathological fracture: Secondary | ICD-10-CM | POA: Diagnosis not present

## 2023-04-02 DIAGNOSIS — L97822 Non-pressure chronic ulcer of other part of left lower leg with fat layer exposed: Secondary | ICD-10-CM | POA: Diagnosis not present

## 2023-04-02 DIAGNOSIS — I87312 Chronic venous hypertension (idiopathic) with ulcer of left lower extremity: Secondary | ICD-10-CM | POA: Diagnosis not present

## 2023-04-02 DIAGNOSIS — I87321 Chronic venous hypertension (idiopathic) with inflammation of right lower extremity: Secondary | ICD-10-CM | POA: Diagnosis not present

## 2023-04-02 DIAGNOSIS — Z48817 Encounter for surgical aftercare following surgery on the skin and subcutaneous tissue: Secondary | ICD-10-CM | POA: Diagnosis not present

## 2023-04-04 DIAGNOSIS — Z48817 Encounter for surgical aftercare following surgery on the skin and subcutaneous tissue: Secondary | ICD-10-CM | POA: Diagnosis not present

## 2023-04-04 DIAGNOSIS — I87321 Chronic venous hypertension (idiopathic) with inflammation of right lower extremity: Secondary | ICD-10-CM | POA: Diagnosis not present

## 2023-04-04 DIAGNOSIS — I87312 Chronic venous hypertension (idiopathic) with ulcer of left lower extremity: Secondary | ICD-10-CM | POA: Diagnosis not present

## 2023-04-04 DIAGNOSIS — I890 Lymphedema, not elsewhere classified: Secondary | ICD-10-CM | POA: Diagnosis not present

## 2023-04-04 DIAGNOSIS — L97822 Non-pressure chronic ulcer of other part of left lower leg with fat layer exposed: Secondary | ICD-10-CM | POA: Diagnosis not present

## 2023-04-04 DIAGNOSIS — M810 Age-related osteoporosis without current pathological fracture: Secondary | ICD-10-CM | POA: Diagnosis not present

## 2023-04-06 DIAGNOSIS — L97822 Non-pressure chronic ulcer of other part of left lower leg with fat layer exposed: Secondary | ICD-10-CM | POA: Diagnosis not present

## 2023-04-06 DIAGNOSIS — I87321 Chronic venous hypertension (idiopathic) with inflammation of right lower extremity: Secondary | ICD-10-CM | POA: Diagnosis not present

## 2023-04-06 DIAGNOSIS — Z48817 Encounter for surgical aftercare following surgery on the skin and subcutaneous tissue: Secondary | ICD-10-CM | POA: Diagnosis not present

## 2023-04-06 DIAGNOSIS — I87312 Chronic venous hypertension (idiopathic) with ulcer of left lower extremity: Secondary | ICD-10-CM | POA: Diagnosis not present

## 2023-04-06 DIAGNOSIS — M810 Age-related osteoporosis without current pathological fracture: Secondary | ICD-10-CM | POA: Diagnosis not present

## 2023-04-06 DIAGNOSIS — I890 Lymphedema, not elsewhere classified: Secondary | ICD-10-CM | POA: Diagnosis not present

## 2023-04-09 DIAGNOSIS — L84 Corns and callosities: Secondary | ICD-10-CM | POA: Diagnosis not present

## 2023-04-09 DIAGNOSIS — M216X1 Other acquired deformities of right foot: Secondary | ICD-10-CM | POA: Diagnosis not present

## 2023-04-09 DIAGNOSIS — L97319 Non-pressure chronic ulcer of right ankle with unspecified severity: Secondary | ICD-10-CM | POA: Diagnosis not present

## 2023-04-09 DIAGNOSIS — I10 Essential (primary) hypertension: Secondary | ICD-10-CM | POA: Diagnosis not present

## 2023-04-09 DIAGNOSIS — L97322 Non-pressure chronic ulcer of left ankle with fat layer exposed: Secondary | ICD-10-CM | POA: Diagnosis not present

## 2023-04-09 DIAGNOSIS — L97529 Non-pressure chronic ulcer of other part of left foot with unspecified severity: Secondary | ICD-10-CM | POA: Diagnosis not present

## 2023-04-09 DIAGNOSIS — M21371 Foot drop, right foot: Secondary | ICD-10-CM | POA: Diagnosis not present

## 2023-04-09 DIAGNOSIS — R531 Weakness: Secondary | ICD-10-CM | POA: Diagnosis not present

## 2023-04-09 DIAGNOSIS — Z89422 Acquired absence of other left toe(s): Secondary | ICD-10-CM | POA: Diagnosis not present

## 2023-04-09 DIAGNOSIS — I890 Lymphedema, not elsewhere classified: Secondary | ICD-10-CM | POA: Diagnosis not present

## 2023-04-09 DIAGNOSIS — M65972 Unspecified synovitis and tenosynovitis, left ankle and foot: Secondary | ICD-10-CM | POA: Diagnosis not present

## 2023-04-09 DIAGNOSIS — L97329 Non-pressure chronic ulcer of left ankle with unspecified severity: Secondary | ICD-10-CM | POA: Diagnosis not present

## 2023-04-09 DIAGNOSIS — I83023 Varicose veins of left lower extremity with ulcer of ankle: Secondary | ICD-10-CM | POA: Diagnosis not present

## 2023-04-09 DIAGNOSIS — Z89412 Acquired absence of left great toe: Secondary | ICD-10-CM | POA: Diagnosis not present

## 2023-04-09 DIAGNOSIS — I872 Venous insufficiency (chronic) (peripheral): Secondary | ICD-10-CM | POA: Diagnosis not present

## 2023-04-12 DIAGNOSIS — M810 Age-related osteoporosis without current pathological fracture: Secondary | ICD-10-CM | POA: Diagnosis not present

## 2023-04-12 DIAGNOSIS — I87312 Chronic venous hypertension (idiopathic) with ulcer of left lower extremity: Secondary | ICD-10-CM | POA: Diagnosis not present

## 2023-04-12 DIAGNOSIS — Z48817 Encounter for surgical aftercare following surgery on the skin and subcutaneous tissue: Secondary | ICD-10-CM | POA: Diagnosis not present

## 2023-04-12 DIAGNOSIS — I87321 Chronic venous hypertension (idiopathic) with inflammation of right lower extremity: Secondary | ICD-10-CM | POA: Diagnosis not present

## 2023-04-12 DIAGNOSIS — I890 Lymphedema, not elsewhere classified: Secondary | ICD-10-CM | POA: Diagnosis not present

## 2023-04-12 DIAGNOSIS — L97822 Non-pressure chronic ulcer of other part of left lower leg with fat layer exposed: Secondary | ICD-10-CM | POA: Diagnosis not present

## 2023-04-13 DIAGNOSIS — L97822 Non-pressure chronic ulcer of other part of left lower leg with fat layer exposed: Secondary | ICD-10-CM | POA: Diagnosis not present

## 2023-04-13 DIAGNOSIS — Z48817 Encounter for surgical aftercare following surgery on the skin and subcutaneous tissue: Secondary | ICD-10-CM | POA: Diagnosis not present

## 2023-04-13 DIAGNOSIS — I890 Lymphedema, not elsewhere classified: Secondary | ICD-10-CM | POA: Diagnosis not present

## 2023-04-13 DIAGNOSIS — M810 Age-related osteoporosis without current pathological fracture: Secondary | ICD-10-CM | POA: Diagnosis not present

## 2023-04-13 DIAGNOSIS — I87321 Chronic venous hypertension (idiopathic) with inflammation of right lower extremity: Secondary | ICD-10-CM | POA: Diagnosis not present

## 2023-04-13 DIAGNOSIS — I87312 Chronic venous hypertension (idiopathic) with ulcer of left lower extremity: Secondary | ICD-10-CM | POA: Diagnosis not present

## 2023-04-16 DIAGNOSIS — L97822 Non-pressure chronic ulcer of other part of left lower leg with fat layer exposed: Secondary | ICD-10-CM | POA: Diagnosis not present

## 2023-04-16 DIAGNOSIS — I87321 Chronic venous hypertension (idiopathic) with inflammation of right lower extremity: Secondary | ICD-10-CM | POA: Diagnosis not present

## 2023-04-16 DIAGNOSIS — I87312 Chronic venous hypertension (idiopathic) with ulcer of left lower extremity: Secondary | ICD-10-CM | POA: Diagnosis not present

## 2023-04-16 DIAGNOSIS — M810 Age-related osteoporosis without current pathological fracture: Secondary | ICD-10-CM | POA: Diagnosis not present

## 2023-04-16 DIAGNOSIS — Z556 Problems related to health literacy: Secondary | ICD-10-CM | POA: Diagnosis not present

## 2023-04-16 DIAGNOSIS — Z48817 Encounter for surgical aftercare following surgery on the skin and subcutaneous tissue: Secondary | ICD-10-CM | POA: Diagnosis not present

## 2023-04-16 DIAGNOSIS — I890 Lymphedema, not elsewhere classified: Secondary | ICD-10-CM | POA: Diagnosis not present

## 2023-04-17 DIAGNOSIS — Z961 Presence of intraocular lens: Secondary | ICD-10-CM | POA: Diagnosis not present

## 2023-04-17 DIAGNOSIS — H524 Presbyopia: Secondary | ICD-10-CM | POA: Diagnosis not present

## 2023-04-17 DIAGNOSIS — H43813 Vitreous degeneration, bilateral: Secondary | ICD-10-CM | POA: Diagnosis not present

## 2023-04-17 DIAGNOSIS — H26491 Other secondary cataract, right eye: Secondary | ICD-10-CM | POA: Diagnosis not present

## 2023-04-18 DIAGNOSIS — I87312 Chronic venous hypertension (idiopathic) with ulcer of left lower extremity: Secondary | ICD-10-CM | POA: Diagnosis not present

## 2023-04-18 DIAGNOSIS — I890 Lymphedema, not elsewhere classified: Secondary | ICD-10-CM | POA: Diagnosis not present

## 2023-04-18 DIAGNOSIS — I87321 Chronic venous hypertension (idiopathic) with inflammation of right lower extremity: Secondary | ICD-10-CM | POA: Diagnosis not present

## 2023-04-18 DIAGNOSIS — Z48817 Encounter for surgical aftercare following surgery on the skin and subcutaneous tissue: Secondary | ICD-10-CM | POA: Diagnosis not present

## 2023-04-18 DIAGNOSIS — L97822 Non-pressure chronic ulcer of other part of left lower leg with fat layer exposed: Secondary | ICD-10-CM | POA: Diagnosis not present

## 2023-04-18 DIAGNOSIS — M810 Age-related osteoporosis without current pathological fracture: Secondary | ICD-10-CM | POA: Diagnosis not present

## 2023-04-20 DIAGNOSIS — Z48817 Encounter for surgical aftercare following surgery on the skin and subcutaneous tissue: Secondary | ICD-10-CM | POA: Diagnosis not present

## 2023-04-20 DIAGNOSIS — I87312 Chronic venous hypertension (idiopathic) with ulcer of left lower extremity: Secondary | ICD-10-CM | POA: Diagnosis not present

## 2023-04-20 DIAGNOSIS — I87321 Chronic venous hypertension (idiopathic) with inflammation of right lower extremity: Secondary | ICD-10-CM | POA: Diagnosis not present

## 2023-04-20 DIAGNOSIS — L97822 Non-pressure chronic ulcer of other part of left lower leg with fat layer exposed: Secondary | ICD-10-CM | POA: Diagnosis not present

## 2023-04-20 DIAGNOSIS — I890 Lymphedema, not elsewhere classified: Secondary | ICD-10-CM | POA: Diagnosis not present

## 2023-04-20 DIAGNOSIS — M810 Age-related osteoporosis without current pathological fracture: Secondary | ICD-10-CM | POA: Diagnosis not present

## 2023-04-23 DIAGNOSIS — I87312 Chronic venous hypertension (idiopathic) with ulcer of left lower extremity: Secondary | ICD-10-CM | POA: Diagnosis not present

## 2023-04-23 DIAGNOSIS — L97822 Non-pressure chronic ulcer of other part of left lower leg with fat layer exposed: Secondary | ICD-10-CM | POA: Diagnosis not present

## 2023-04-23 DIAGNOSIS — M810 Age-related osteoporosis without current pathological fracture: Secondary | ICD-10-CM | POA: Diagnosis not present

## 2023-04-23 DIAGNOSIS — Z48817 Encounter for surgical aftercare following surgery on the skin and subcutaneous tissue: Secondary | ICD-10-CM | POA: Diagnosis not present

## 2023-04-23 DIAGNOSIS — I87321 Chronic venous hypertension (idiopathic) with inflammation of right lower extremity: Secondary | ICD-10-CM | POA: Diagnosis not present

## 2023-04-23 DIAGNOSIS — I890 Lymphedema, not elsewhere classified: Secondary | ICD-10-CM | POA: Diagnosis not present

## 2023-04-24 DIAGNOSIS — I872 Venous insufficiency (chronic) (peripheral): Secondary | ICD-10-CM | POA: Diagnosis not present

## 2023-04-24 DIAGNOSIS — N3941 Urge incontinence: Secondary | ICD-10-CM | POA: Diagnosis not present

## 2023-04-24 DIAGNOSIS — Z6830 Body mass index (BMI) 30.0-30.9, adult: Secondary | ICD-10-CM | POA: Diagnosis not present

## 2023-04-24 DIAGNOSIS — F5101 Primary insomnia: Secondary | ICD-10-CM | POA: Diagnosis not present

## 2023-04-24 DIAGNOSIS — M170 Bilateral primary osteoarthritis of knee: Secondary | ICD-10-CM | POA: Diagnosis not present

## 2023-04-24 DIAGNOSIS — I10 Essential (primary) hypertension: Secondary | ICD-10-CM | POA: Diagnosis not present

## 2023-04-24 DIAGNOSIS — I878 Other specified disorders of veins: Secondary | ICD-10-CM | POA: Diagnosis not present

## 2023-04-25 DIAGNOSIS — M810 Age-related osteoporosis without current pathological fracture: Secondary | ICD-10-CM | POA: Diagnosis not present

## 2023-04-25 DIAGNOSIS — I87321 Chronic venous hypertension (idiopathic) with inflammation of right lower extremity: Secondary | ICD-10-CM | POA: Diagnosis not present

## 2023-04-25 DIAGNOSIS — I890 Lymphedema, not elsewhere classified: Secondary | ICD-10-CM | POA: Diagnosis not present

## 2023-04-25 DIAGNOSIS — I87312 Chronic venous hypertension (idiopathic) with ulcer of left lower extremity: Secondary | ICD-10-CM | POA: Diagnosis not present

## 2023-04-25 DIAGNOSIS — L97822 Non-pressure chronic ulcer of other part of left lower leg with fat layer exposed: Secondary | ICD-10-CM | POA: Diagnosis not present

## 2023-04-25 DIAGNOSIS — Z48817 Encounter for surgical aftercare following surgery on the skin and subcutaneous tissue: Secondary | ICD-10-CM | POA: Diagnosis not present

## 2023-04-27 DIAGNOSIS — Z48817 Encounter for surgical aftercare following surgery on the skin and subcutaneous tissue: Secondary | ICD-10-CM | POA: Diagnosis not present

## 2023-04-27 DIAGNOSIS — I87321 Chronic venous hypertension (idiopathic) with inflammation of right lower extremity: Secondary | ICD-10-CM | POA: Diagnosis not present

## 2023-04-27 DIAGNOSIS — M810 Age-related osteoporosis without current pathological fracture: Secondary | ICD-10-CM | POA: Diagnosis not present

## 2023-04-27 DIAGNOSIS — I890 Lymphedema, not elsewhere classified: Secondary | ICD-10-CM | POA: Diagnosis not present

## 2023-04-27 DIAGNOSIS — I87312 Chronic venous hypertension (idiopathic) with ulcer of left lower extremity: Secondary | ICD-10-CM | POA: Diagnosis not present

## 2023-04-27 DIAGNOSIS — L97822 Non-pressure chronic ulcer of other part of left lower leg with fat layer exposed: Secondary | ICD-10-CM | POA: Diagnosis not present

## 2023-04-30 DIAGNOSIS — M810 Age-related osteoporosis without current pathological fracture: Secondary | ICD-10-CM | POA: Diagnosis not present

## 2023-04-30 DIAGNOSIS — L97822 Non-pressure chronic ulcer of other part of left lower leg with fat layer exposed: Secondary | ICD-10-CM | POA: Diagnosis not present

## 2023-04-30 DIAGNOSIS — I890 Lymphedema, not elsewhere classified: Secondary | ICD-10-CM | POA: Diagnosis not present

## 2023-04-30 DIAGNOSIS — I87321 Chronic venous hypertension (idiopathic) with inflammation of right lower extremity: Secondary | ICD-10-CM | POA: Diagnosis not present

## 2023-04-30 DIAGNOSIS — I87312 Chronic venous hypertension (idiopathic) with ulcer of left lower extremity: Secondary | ICD-10-CM | POA: Diagnosis not present

## 2023-04-30 DIAGNOSIS — Z48817 Encounter for surgical aftercare following surgery on the skin and subcutaneous tissue: Secondary | ICD-10-CM | POA: Diagnosis not present

## 2023-05-02 DIAGNOSIS — I87312 Chronic venous hypertension (idiopathic) with ulcer of left lower extremity: Secondary | ICD-10-CM | POA: Diagnosis not present

## 2023-05-02 DIAGNOSIS — I87321 Chronic venous hypertension (idiopathic) with inflammation of right lower extremity: Secondary | ICD-10-CM | POA: Diagnosis not present

## 2023-05-02 DIAGNOSIS — M810 Age-related osteoporosis without current pathological fracture: Secondary | ICD-10-CM | POA: Diagnosis not present

## 2023-05-02 DIAGNOSIS — I890 Lymphedema, not elsewhere classified: Secondary | ICD-10-CM | POA: Diagnosis not present

## 2023-05-02 DIAGNOSIS — Z48817 Encounter for surgical aftercare following surgery on the skin and subcutaneous tissue: Secondary | ICD-10-CM | POA: Diagnosis not present

## 2023-05-02 DIAGNOSIS — L97822 Non-pressure chronic ulcer of other part of left lower leg with fat layer exposed: Secondary | ICD-10-CM | POA: Diagnosis not present

## 2023-05-04 DIAGNOSIS — L97822 Non-pressure chronic ulcer of other part of left lower leg with fat layer exposed: Secondary | ICD-10-CM | POA: Diagnosis not present

## 2023-05-04 DIAGNOSIS — Z48817 Encounter for surgical aftercare following surgery on the skin and subcutaneous tissue: Secondary | ICD-10-CM | POA: Diagnosis not present

## 2023-05-04 DIAGNOSIS — M810 Age-related osteoporosis without current pathological fracture: Secondary | ICD-10-CM | POA: Diagnosis not present

## 2023-05-04 DIAGNOSIS — I87321 Chronic venous hypertension (idiopathic) with inflammation of right lower extremity: Secondary | ICD-10-CM | POA: Diagnosis not present

## 2023-05-04 DIAGNOSIS — I87312 Chronic venous hypertension (idiopathic) with ulcer of left lower extremity: Secondary | ICD-10-CM | POA: Diagnosis not present

## 2023-05-04 DIAGNOSIS — I890 Lymphedema, not elsewhere classified: Secondary | ICD-10-CM | POA: Diagnosis not present

## 2023-05-07 DIAGNOSIS — L97822 Non-pressure chronic ulcer of other part of left lower leg with fat layer exposed: Secondary | ICD-10-CM | POA: Diagnosis not present

## 2023-05-07 DIAGNOSIS — I890 Lymphedema, not elsewhere classified: Secondary | ICD-10-CM | POA: Diagnosis not present

## 2023-05-07 DIAGNOSIS — M810 Age-related osteoporosis without current pathological fracture: Secondary | ICD-10-CM | POA: Diagnosis not present

## 2023-05-07 DIAGNOSIS — Z48817 Encounter for surgical aftercare following surgery on the skin and subcutaneous tissue: Secondary | ICD-10-CM | POA: Diagnosis not present

## 2023-05-07 DIAGNOSIS — I87321 Chronic venous hypertension (idiopathic) with inflammation of right lower extremity: Secondary | ICD-10-CM | POA: Diagnosis not present

## 2023-05-07 DIAGNOSIS — I87312 Chronic venous hypertension (idiopathic) with ulcer of left lower extremity: Secondary | ICD-10-CM | POA: Diagnosis not present

## 2023-05-09 DIAGNOSIS — M810 Age-related osteoporosis without current pathological fracture: Secondary | ICD-10-CM | POA: Diagnosis not present

## 2023-05-09 DIAGNOSIS — I87321 Chronic venous hypertension (idiopathic) with inflammation of right lower extremity: Secondary | ICD-10-CM | POA: Diagnosis not present

## 2023-05-09 DIAGNOSIS — I890 Lymphedema, not elsewhere classified: Secondary | ICD-10-CM | POA: Diagnosis not present

## 2023-05-09 DIAGNOSIS — L97822 Non-pressure chronic ulcer of other part of left lower leg with fat layer exposed: Secondary | ICD-10-CM | POA: Diagnosis not present

## 2023-05-09 DIAGNOSIS — Z48817 Encounter for surgical aftercare following surgery on the skin and subcutaneous tissue: Secondary | ICD-10-CM | POA: Diagnosis not present

## 2023-05-09 DIAGNOSIS — I87312 Chronic venous hypertension (idiopathic) with ulcer of left lower extremity: Secondary | ICD-10-CM | POA: Diagnosis not present

## 2023-05-10 DIAGNOSIS — M25562 Pain in left knee: Secondary | ICD-10-CM | POA: Diagnosis not present

## 2023-05-10 DIAGNOSIS — I10 Essential (primary) hypertension: Secondary | ICD-10-CM | POA: Diagnosis not present

## 2023-05-10 DIAGNOSIS — Z89422 Acquired absence of other left toe(s): Secondary | ICD-10-CM | POA: Diagnosis not present

## 2023-05-10 DIAGNOSIS — Z9889 Other specified postprocedural states: Secondary | ICD-10-CM | POA: Diagnosis not present

## 2023-05-10 DIAGNOSIS — M25561 Pain in right knee: Secondary | ICD-10-CM | POA: Diagnosis not present

## 2023-05-10 DIAGNOSIS — G8929 Other chronic pain: Secondary | ICD-10-CM | POA: Diagnosis not present

## 2023-05-10 DIAGNOSIS — M810 Age-related osteoporosis without current pathological fracture: Secondary | ICD-10-CM | POA: Diagnosis not present

## 2023-05-10 DIAGNOSIS — L97329 Non-pressure chronic ulcer of left ankle with unspecified severity: Secondary | ICD-10-CM | POA: Diagnosis not present

## 2023-05-10 DIAGNOSIS — I878 Other specified disorders of veins: Secondary | ICD-10-CM | POA: Diagnosis not present

## 2023-05-10 DIAGNOSIS — L97921 Non-pressure chronic ulcer of unspecified part of left lower leg limited to breakdown of skin: Secondary | ICD-10-CM | POA: Diagnosis not present

## 2023-05-10 DIAGNOSIS — I890 Lymphedema, not elsewhere classified: Secondary | ICD-10-CM | POA: Diagnosis not present

## 2023-05-14 DIAGNOSIS — I87321 Chronic venous hypertension (idiopathic) with inflammation of right lower extremity: Secondary | ICD-10-CM | POA: Diagnosis not present

## 2023-05-14 DIAGNOSIS — Z48817 Encounter for surgical aftercare following surgery on the skin and subcutaneous tissue: Secondary | ICD-10-CM | POA: Diagnosis not present

## 2023-05-14 DIAGNOSIS — L97822 Non-pressure chronic ulcer of other part of left lower leg with fat layer exposed: Secondary | ICD-10-CM | POA: Diagnosis not present

## 2023-05-14 DIAGNOSIS — I890 Lymphedema, not elsewhere classified: Secondary | ICD-10-CM | POA: Diagnosis not present

## 2023-05-14 DIAGNOSIS — M810 Age-related osteoporosis without current pathological fracture: Secondary | ICD-10-CM | POA: Diagnosis not present

## 2023-05-14 DIAGNOSIS — I87312 Chronic venous hypertension (idiopathic) with ulcer of left lower extremity: Secondary | ICD-10-CM | POA: Diagnosis not present

## 2023-05-16 DIAGNOSIS — I890 Lymphedema, not elsewhere classified: Secondary | ICD-10-CM | POA: Diagnosis not present

## 2023-05-16 DIAGNOSIS — I87321 Chronic venous hypertension (idiopathic) with inflammation of right lower extremity: Secondary | ICD-10-CM | POA: Diagnosis not present

## 2023-05-16 DIAGNOSIS — Z556 Problems related to health literacy: Secondary | ICD-10-CM | POA: Diagnosis not present

## 2023-05-16 DIAGNOSIS — M810 Age-related osteoporosis without current pathological fracture: Secondary | ICD-10-CM | POA: Diagnosis not present

## 2023-05-16 DIAGNOSIS — I87312 Chronic venous hypertension (idiopathic) with ulcer of left lower extremity: Secondary | ICD-10-CM | POA: Diagnosis not present

## 2023-05-16 DIAGNOSIS — Z48817 Encounter for surgical aftercare following surgery on the skin and subcutaneous tissue: Secondary | ICD-10-CM | POA: Diagnosis not present

## 2023-05-16 DIAGNOSIS — L97822 Non-pressure chronic ulcer of other part of left lower leg with fat layer exposed: Secondary | ICD-10-CM | POA: Diagnosis not present

## 2023-05-16 DIAGNOSIS — Z79899 Other long term (current) drug therapy: Secondary | ICD-10-CM | POA: Diagnosis not present

## 2023-05-18 DIAGNOSIS — I87321 Chronic venous hypertension (idiopathic) with inflammation of right lower extremity: Secondary | ICD-10-CM | POA: Diagnosis not present

## 2023-05-18 DIAGNOSIS — M810 Age-related osteoporosis without current pathological fracture: Secondary | ICD-10-CM | POA: Diagnosis not present

## 2023-05-18 DIAGNOSIS — I87312 Chronic venous hypertension (idiopathic) with ulcer of left lower extremity: Secondary | ICD-10-CM | POA: Diagnosis not present

## 2023-05-18 DIAGNOSIS — L97822 Non-pressure chronic ulcer of other part of left lower leg with fat layer exposed: Secondary | ICD-10-CM | POA: Diagnosis not present

## 2023-05-18 DIAGNOSIS — Z48817 Encounter for surgical aftercare following surgery on the skin and subcutaneous tissue: Secondary | ICD-10-CM | POA: Diagnosis not present

## 2023-05-18 DIAGNOSIS — I890 Lymphedema, not elsewhere classified: Secondary | ICD-10-CM | POA: Diagnosis not present

## 2023-05-21 DIAGNOSIS — Z48817 Encounter for surgical aftercare following surgery on the skin and subcutaneous tissue: Secondary | ICD-10-CM | POA: Diagnosis not present

## 2023-05-21 DIAGNOSIS — I87312 Chronic venous hypertension (idiopathic) with ulcer of left lower extremity: Secondary | ICD-10-CM | POA: Diagnosis not present

## 2023-05-21 DIAGNOSIS — L97822 Non-pressure chronic ulcer of other part of left lower leg with fat layer exposed: Secondary | ICD-10-CM | POA: Diagnosis not present

## 2023-05-21 DIAGNOSIS — I87321 Chronic venous hypertension (idiopathic) with inflammation of right lower extremity: Secondary | ICD-10-CM | POA: Diagnosis not present

## 2023-05-21 DIAGNOSIS — M810 Age-related osteoporosis without current pathological fracture: Secondary | ICD-10-CM | POA: Diagnosis not present

## 2023-05-21 DIAGNOSIS — I890 Lymphedema, not elsewhere classified: Secondary | ICD-10-CM | POA: Diagnosis not present

## 2023-05-23 DIAGNOSIS — L97822 Non-pressure chronic ulcer of other part of left lower leg with fat layer exposed: Secondary | ICD-10-CM | POA: Diagnosis not present

## 2023-05-23 DIAGNOSIS — Z48817 Encounter for surgical aftercare following surgery on the skin and subcutaneous tissue: Secondary | ICD-10-CM | POA: Diagnosis not present

## 2023-05-23 DIAGNOSIS — I87312 Chronic venous hypertension (idiopathic) with ulcer of left lower extremity: Secondary | ICD-10-CM | POA: Diagnosis not present

## 2023-05-23 DIAGNOSIS — I890 Lymphedema, not elsewhere classified: Secondary | ICD-10-CM | POA: Diagnosis not present

## 2023-05-23 DIAGNOSIS — M810 Age-related osteoporosis without current pathological fracture: Secondary | ICD-10-CM | POA: Diagnosis not present

## 2023-05-23 DIAGNOSIS — I87321 Chronic venous hypertension (idiopathic) with inflammation of right lower extremity: Secondary | ICD-10-CM | POA: Diagnosis not present

## 2023-05-24 DIAGNOSIS — Z1231 Encounter for screening mammogram for malignant neoplasm of breast: Secondary | ICD-10-CM | POA: Diagnosis not present

## 2023-05-25 DIAGNOSIS — I890 Lymphedema, not elsewhere classified: Secondary | ICD-10-CM | POA: Diagnosis not present

## 2023-05-25 DIAGNOSIS — M810 Age-related osteoporosis without current pathological fracture: Secondary | ICD-10-CM | POA: Diagnosis not present

## 2023-05-25 DIAGNOSIS — I87321 Chronic venous hypertension (idiopathic) with inflammation of right lower extremity: Secondary | ICD-10-CM | POA: Diagnosis not present

## 2023-05-25 DIAGNOSIS — L97822 Non-pressure chronic ulcer of other part of left lower leg with fat layer exposed: Secondary | ICD-10-CM | POA: Diagnosis not present

## 2023-05-25 DIAGNOSIS — I87312 Chronic venous hypertension (idiopathic) with ulcer of left lower extremity: Secondary | ICD-10-CM | POA: Diagnosis not present

## 2023-05-25 DIAGNOSIS — Z48817 Encounter for surgical aftercare following surgery on the skin and subcutaneous tissue: Secondary | ICD-10-CM | POA: Diagnosis not present

## 2023-05-28 DIAGNOSIS — I87312 Chronic venous hypertension (idiopathic) with ulcer of left lower extremity: Secondary | ICD-10-CM | POA: Diagnosis not present

## 2023-05-28 DIAGNOSIS — I87321 Chronic venous hypertension (idiopathic) with inflammation of right lower extremity: Secondary | ICD-10-CM | POA: Diagnosis not present

## 2023-05-28 DIAGNOSIS — Z48817 Encounter for surgical aftercare following surgery on the skin and subcutaneous tissue: Secondary | ICD-10-CM | POA: Diagnosis not present

## 2023-05-28 DIAGNOSIS — I890 Lymphedema, not elsewhere classified: Secondary | ICD-10-CM | POA: Diagnosis not present

## 2023-05-28 DIAGNOSIS — L97822 Non-pressure chronic ulcer of other part of left lower leg with fat layer exposed: Secondary | ICD-10-CM | POA: Diagnosis not present

## 2023-05-28 DIAGNOSIS — M810 Age-related osteoporosis without current pathological fracture: Secondary | ICD-10-CM | POA: Diagnosis not present

## 2023-05-30 DIAGNOSIS — I87321 Chronic venous hypertension (idiopathic) with inflammation of right lower extremity: Secondary | ICD-10-CM | POA: Diagnosis not present

## 2023-05-30 DIAGNOSIS — M810 Age-related osteoporosis without current pathological fracture: Secondary | ICD-10-CM | POA: Diagnosis not present

## 2023-05-30 DIAGNOSIS — I890 Lymphedema, not elsewhere classified: Secondary | ICD-10-CM | POA: Diagnosis not present

## 2023-05-30 DIAGNOSIS — L97822 Non-pressure chronic ulcer of other part of left lower leg with fat layer exposed: Secondary | ICD-10-CM | POA: Diagnosis not present

## 2023-05-30 DIAGNOSIS — Z48817 Encounter for surgical aftercare following surgery on the skin and subcutaneous tissue: Secondary | ICD-10-CM | POA: Diagnosis not present

## 2023-05-30 DIAGNOSIS — I87312 Chronic venous hypertension (idiopathic) with ulcer of left lower extremity: Secondary | ICD-10-CM | POA: Diagnosis not present

## 2023-05-31 DIAGNOSIS — I739 Peripheral vascular disease, unspecified: Secondary | ICD-10-CM | POA: Diagnosis not present

## 2023-05-31 DIAGNOSIS — B351 Tinea unguium: Secondary | ICD-10-CM | POA: Diagnosis not present

## 2023-05-31 DIAGNOSIS — M2042 Other hammer toe(s) (acquired), left foot: Secondary | ICD-10-CM | POA: Diagnosis not present

## 2023-05-31 DIAGNOSIS — M2041 Other hammer toe(s) (acquired), right foot: Secondary | ICD-10-CM | POA: Diagnosis not present

## 2023-05-31 DIAGNOSIS — L600 Ingrowing nail: Secondary | ICD-10-CM | POA: Diagnosis not present

## 2023-05-31 DIAGNOSIS — L84 Corns and callosities: Secondary | ICD-10-CM | POA: Diagnosis not present

## 2023-05-31 DIAGNOSIS — M79671 Pain in right foot: Secondary | ICD-10-CM | POA: Diagnosis not present

## 2023-05-31 DIAGNOSIS — M79672 Pain in left foot: Secondary | ICD-10-CM | POA: Diagnosis not present

## 2023-06-01 DIAGNOSIS — I87312 Chronic venous hypertension (idiopathic) with ulcer of left lower extremity: Secondary | ICD-10-CM | POA: Diagnosis not present

## 2023-06-01 DIAGNOSIS — M810 Age-related osteoporosis without current pathological fracture: Secondary | ICD-10-CM | POA: Diagnosis not present

## 2023-06-01 DIAGNOSIS — I890 Lymphedema, not elsewhere classified: Secondary | ICD-10-CM | POA: Diagnosis not present

## 2023-06-01 DIAGNOSIS — Z48817 Encounter for surgical aftercare following surgery on the skin and subcutaneous tissue: Secondary | ICD-10-CM | POA: Diagnosis not present

## 2023-06-01 DIAGNOSIS — L97822 Non-pressure chronic ulcer of other part of left lower leg with fat layer exposed: Secondary | ICD-10-CM | POA: Diagnosis not present

## 2023-06-01 DIAGNOSIS — I87321 Chronic venous hypertension (idiopathic) with inflammation of right lower extremity: Secondary | ICD-10-CM | POA: Diagnosis not present

## 2023-06-04 DIAGNOSIS — L97822 Non-pressure chronic ulcer of other part of left lower leg with fat layer exposed: Secondary | ICD-10-CM | POA: Diagnosis not present

## 2023-06-04 DIAGNOSIS — Z48817 Encounter for surgical aftercare following surgery on the skin and subcutaneous tissue: Secondary | ICD-10-CM | POA: Diagnosis not present

## 2023-06-04 DIAGNOSIS — M810 Age-related osteoporosis without current pathological fracture: Secondary | ICD-10-CM | POA: Diagnosis not present

## 2023-06-04 DIAGNOSIS — I890 Lymphedema, not elsewhere classified: Secondary | ICD-10-CM | POA: Diagnosis not present

## 2023-06-04 DIAGNOSIS — I87312 Chronic venous hypertension (idiopathic) with ulcer of left lower extremity: Secondary | ICD-10-CM | POA: Diagnosis not present

## 2023-06-04 DIAGNOSIS — I87321 Chronic venous hypertension (idiopathic) with inflammation of right lower extremity: Secondary | ICD-10-CM | POA: Diagnosis not present

## 2023-06-06 DIAGNOSIS — Z48817 Encounter for surgical aftercare following surgery on the skin and subcutaneous tissue: Secondary | ICD-10-CM | POA: Diagnosis not present

## 2023-06-06 DIAGNOSIS — M810 Age-related osteoporosis without current pathological fracture: Secondary | ICD-10-CM | POA: Diagnosis not present

## 2023-06-06 DIAGNOSIS — I890 Lymphedema, not elsewhere classified: Secondary | ICD-10-CM | POA: Diagnosis not present

## 2023-06-06 DIAGNOSIS — L97822 Non-pressure chronic ulcer of other part of left lower leg with fat layer exposed: Secondary | ICD-10-CM | POA: Diagnosis not present

## 2023-06-06 DIAGNOSIS — I87321 Chronic venous hypertension (idiopathic) with inflammation of right lower extremity: Secondary | ICD-10-CM | POA: Diagnosis not present

## 2023-06-06 DIAGNOSIS — I87312 Chronic venous hypertension (idiopathic) with ulcer of left lower extremity: Secondary | ICD-10-CM | POA: Diagnosis not present

## 2023-06-08 DIAGNOSIS — I87321 Chronic venous hypertension (idiopathic) with inflammation of right lower extremity: Secondary | ICD-10-CM | POA: Diagnosis not present

## 2023-06-08 DIAGNOSIS — I890 Lymphedema, not elsewhere classified: Secondary | ICD-10-CM | POA: Diagnosis not present

## 2023-06-08 DIAGNOSIS — I87312 Chronic venous hypertension (idiopathic) with ulcer of left lower extremity: Secondary | ICD-10-CM | POA: Diagnosis not present

## 2023-06-08 DIAGNOSIS — Z48817 Encounter for surgical aftercare following surgery on the skin and subcutaneous tissue: Secondary | ICD-10-CM | POA: Diagnosis not present

## 2023-06-08 DIAGNOSIS — M810 Age-related osteoporosis without current pathological fracture: Secondary | ICD-10-CM | POA: Diagnosis not present

## 2023-06-08 DIAGNOSIS — L97822 Non-pressure chronic ulcer of other part of left lower leg with fat layer exposed: Secondary | ICD-10-CM | POA: Diagnosis not present

## 2023-06-11 DIAGNOSIS — L97822 Non-pressure chronic ulcer of other part of left lower leg with fat layer exposed: Secondary | ICD-10-CM | POA: Diagnosis not present

## 2023-06-11 DIAGNOSIS — I87312 Chronic venous hypertension (idiopathic) with ulcer of left lower extremity: Secondary | ICD-10-CM | POA: Diagnosis not present

## 2023-06-11 DIAGNOSIS — Z48817 Encounter for surgical aftercare following surgery on the skin and subcutaneous tissue: Secondary | ICD-10-CM | POA: Diagnosis not present

## 2023-06-11 DIAGNOSIS — I890 Lymphedema, not elsewhere classified: Secondary | ICD-10-CM | POA: Diagnosis not present

## 2023-06-11 DIAGNOSIS — I87321 Chronic venous hypertension (idiopathic) with inflammation of right lower extremity: Secondary | ICD-10-CM | POA: Diagnosis not present

## 2023-06-11 DIAGNOSIS — M810 Age-related osteoporosis without current pathological fracture: Secondary | ICD-10-CM | POA: Diagnosis not present

## 2023-06-13 DIAGNOSIS — M810 Age-related osteoporosis without current pathological fracture: Secondary | ICD-10-CM | POA: Diagnosis not present

## 2023-06-13 DIAGNOSIS — L97822 Non-pressure chronic ulcer of other part of left lower leg with fat layer exposed: Secondary | ICD-10-CM | POA: Diagnosis not present

## 2023-06-13 DIAGNOSIS — Z48817 Encounter for surgical aftercare following surgery on the skin and subcutaneous tissue: Secondary | ICD-10-CM | POA: Diagnosis not present

## 2023-06-13 DIAGNOSIS — I87312 Chronic venous hypertension (idiopathic) with ulcer of left lower extremity: Secondary | ICD-10-CM | POA: Diagnosis not present

## 2023-06-13 DIAGNOSIS — I890 Lymphedema, not elsewhere classified: Secondary | ICD-10-CM | POA: Diagnosis not present

## 2023-06-13 DIAGNOSIS — I87321 Chronic venous hypertension (idiopathic) with inflammation of right lower extremity: Secondary | ICD-10-CM | POA: Diagnosis not present

## 2023-06-15 DIAGNOSIS — I87321 Chronic venous hypertension (idiopathic) with inflammation of right lower extremity: Secondary | ICD-10-CM | POA: Diagnosis not present

## 2023-06-15 DIAGNOSIS — Z48817 Encounter for surgical aftercare following surgery on the skin and subcutaneous tissue: Secondary | ICD-10-CM | POA: Diagnosis not present

## 2023-06-15 DIAGNOSIS — I87312 Chronic venous hypertension (idiopathic) with ulcer of left lower extremity: Secondary | ICD-10-CM | POA: Diagnosis not present

## 2023-06-15 DIAGNOSIS — I890 Lymphedema, not elsewhere classified: Secondary | ICD-10-CM | POA: Diagnosis not present

## 2023-06-15 DIAGNOSIS — M810 Age-related osteoporosis without current pathological fracture: Secondary | ICD-10-CM | POA: Diagnosis not present

## 2023-06-15 DIAGNOSIS — Z79899 Other long term (current) drug therapy: Secondary | ICD-10-CM | POA: Diagnosis not present

## 2023-06-15 DIAGNOSIS — Z556 Problems related to health literacy: Secondary | ICD-10-CM | POA: Diagnosis not present

## 2023-06-15 DIAGNOSIS — L97822 Non-pressure chronic ulcer of other part of left lower leg with fat layer exposed: Secondary | ICD-10-CM | POA: Diagnosis not present

## 2023-06-18 DIAGNOSIS — I87321 Chronic venous hypertension (idiopathic) with inflammation of right lower extremity: Secondary | ICD-10-CM | POA: Diagnosis not present

## 2023-06-18 DIAGNOSIS — Z48817 Encounter for surgical aftercare following surgery on the skin and subcutaneous tissue: Secondary | ICD-10-CM | POA: Diagnosis not present

## 2023-06-18 DIAGNOSIS — I890 Lymphedema, not elsewhere classified: Secondary | ICD-10-CM | POA: Diagnosis not present

## 2023-06-18 DIAGNOSIS — L97822 Non-pressure chronic ulcer of other part of left lower leg with fat layer exposed: Secondary | ICD-10-CM | POA: Diagnosis not present

## 2023-06-18 DIAGNOSIS — I87312 Chronic venous hypertension (idiopathic) with ulcer of left lower extremity: Secondary | ICD-10-CM | POA: Diagnosis not present

## 2023-06-18 DIAGNOSIS — M810 Age-related osteoporosis without current pathological fracture: Secondary | ICD-10-CM | POA: Diagnosis not present

## 2023-06-20 DIAGNOSIS — I87312 Chronic venous hypertension (idiopathic) with ulcer of left lower extremity: Secondary | ICD-10-CM | POA: Diagnosis not present

## 2023-06-20 DIAGNOSIS — I890 Lymphedema, not elsewhere classified: Secondary | ICD-10-CM | POA: Diagnosis not present

## 2023-06-20 DIAGNOSIS — M810 Age-related osteoporosis without current pathological fracture: Secondary | ICD-10-CM | POA: Diagnosis not present

## 2023-06-20 DIAGNOSIS — L97822 Non-pressure chronic ulcer of other part of left lower leg with fat layer exposed: Secondary | ICD-10-CM | POA: Diagnosis not present

## 2023-06-20 DIAGNOSIS — Z48817 Encounter for surgical aftercare following surgery on the skin and subcutaneous tissue: Secondary | ICD-10-CM | POA: Diagnosis not present

## 2023-06-20 DIAGNOSIS — I87321 Chronic venous hypertension (idiopathic) with inflammation of right lower extremity: Secondary | ICD-10-CM | POA: Diagnosis not present

## 2023-06-21 DIAGNOSIS — S91312A Laceration without foreign body, left foot, initial encounter: Secondary | ICD-10-CM | POA: Diagnosis not present

## 2023-06-21 DIAGNOSIS — G8929 Other chronic pain: Secondary | ICD-10-CM | POA: Diagnosis not present

## 2023-06-21 DIAGNOSIS — L97329 Non-pressure chronic ulcer of left ankle with unspecified severity: Secondary | ICD-10-CM | POA: Diagnosis not present

## 2023-06-21 DIAGNOSIS — L97921 Non-pressure chronic ulcer of unspecified part of left lower leg limited to breakdown of skin: Secondary | ICD-10-CM | POA: Diagnosis not present

## 2023-06-21 DIAGNOSIS — I890 Lymphedema, not elsewhere classified: Secondary | ICD-10-CM | POA: Diagnosis not present

## 2023-06-21 DIAGNOSIS — M810 Age-related osteoporosis without current pathological fracture: Secondary | ICD-10-CM | POA: Diagnosis not present

## 2023-06-21 DIAGNOSIS — I10 Essential (primary) hypertension: Secondary | ICD-10-CM | POA: Diagnosis not present

## 2023-06-21 DIAGNOSIS — M25569 Pain in unspecified knee: Secondary | ICD-10-CM | POA: Diagnosis not present

## 2023-06-25 DIAGNOSIS — I87321 Chronic venous hypertension (idiopathic) with inflammation of right lower extremity: Secondary | ICD-10-CM | POA: Diagnosis not present

## 2023-06-25 DIAGNOSIS — I87312 Chronic venous hypertension (idiopathic) with ulcer of left lower extremity: Secondary | ICD-10-CM | POA: Diagnosis not present

## 2023-06-25 DIAGNOSIS — I890 Lymphedema, not elsewhere classified: Secondary | ICD-10-CM | POA: Diagnosis not present

## 2023-06-25 DIAGNOSIS — L97822 Non-pressure chronic ulcer of other part of left lower leg with fat layer exposed: Secondary | ICD-10-CM | POA: Diagnosis not present

## 2023-06-25 DIAGNOSIS — Z48817 Encounter for surgical aftercare following surgery on the skin and subcutaneous tissue: Secondary | ICD-10-CM | POA: Diagnosis not present

## 2023-06-25 DIAGNOSIS — M810 Age-related osteoporosis without current pathological fracture: Secondary | ICD-10-CM | POA: Diagnosis not present

## 2023-06-27 DIAGNOSIS — M810 Age-related osteoporosis without current pathological fracture: Secondary | ICD-10-CM | POA: Diagnosis not present

## 2023-06-27 DIAGNOSIS — I87321 Chronic venous hypertension (idiopathic) with inflammation of right lower extremity: Secondary | ICD-10-CM | POA: Diagnosis not present

## 2023-06-27 DIAGNOSIS — Z48817 Encounter for surgical aftercare following surgery on the skin and subcutaneous tissue: Secondary | ICD-10-CM | POA: Diagnosis not present

## 2023-06-27 DIAGNOSIS — L97822 Non-pressure chronic ulcer of other part of left lower leg with fat layer exposed: Secondary | ICD-10-CM | POA: Diagnosis not present

## 2023-06-27 DIAGNOSIS — I890 Lymphedema, not elsewhere classified: Secondary | ICD-10-CM | POA: Diagnosis not present

## 2023-06-27 DIAGNOSIS — I87312 Chronic venous hypertension (idiopathic) with ulcer of left lower extremity: Secondary | ICD-10-CM | POA: Diagnosis not present

## 2023-06-30 DIAGNOSIS — Z48817 Encounter for surgical aftercare following surgery on the skin and subcutaneous tissue: Secondary | ICD-10-CM | POA: Diagnosis not present

## 2023-06-30 DIAGNOSIS — I890 Lymphedema, not elsewhere classified: Secondary | ICD-10-CM | POA: Diagnosis not present

## 2023-06-30 DIAGNOSIS — I87321 Chronic venous hypertension (idiopathic) with inflammation of right lower extremity: Secondary | ICD-10-CM | POA: Diagnosis not present

## 2023-06-30 DIAGNOSIS — L97822 Non-pressure chronic ulcer of other part of left lower leg with fat layer exposed: Secondary | ICD-10-CM | POA: Diagnosis not present

## 2023-06-30 DIAGNOSIS — I87312 Chronic venous hypertension (idiopathic) with ulcer of left lower extremity: Secondary | ICD-10-CM | POA: Diagnosis not present

## 2023-06-30 DIAGNOSIS — M810 Age-related osteoporosis without current pathological fracture: Secondary | ICD-10-CM | POA: Diagnosis not present

## 2023-07-02 DIAGNOSIS — I890 Lymphedema, not elsewhere classified: Secondary | ICD-10-CM | POA: Diagnosis not present

## 2023-07-02 DIAGNOSIS — I87312 Chronic venous hypertension (idiopathic) with ulcer of left lower extremity: Secondary | ICD-10-CM | POA: Diagnosis not present

## 2023-07-02 DIAGNOSIS — Z48817 Encounter for surgical aftercare following surgery on the skin and subcutaneous tissue: Secondary | ICD-10-CM | POA: Diagnosis not present

## 2023-07-02 DIAGNOSIS — L97822 Non-pressure chronic ulcer of other part of left lower leg with fat layer exposed: Secondary | ICD-10-CM | POA: Diagnosis not present

## 2023-07-02 DIAGNOSIS — M810 Age-related osteoporosis without current pathological fracture: Secondary | ICD-10-CM | POA: Diagnosis not present

## 2023-07-02 DIAGNOSIS — I87321 Chronic venous hypertension (idiopathic) with inflammation of right lower extremity: Secondary | ICD-10-CM | POA: Diagnosis not present

## 2023-07-04 DIAGNOSIS — I87321 Chronic venous hypertension (idiopathic) with inflammation of right lower extremity: Secondary | ICD-10-CM | POA: Diagnosis not present

## 2023-07-04 DIAGNOSIS — M810 Age-related osteoporosis without current pathological fracture: Secondary | ICD-10-CM | POA: Diagnosis not present

## 2023-07-04 DIAGNOSIS — I890 Lymphedema, not elsewhere classified: Secondary | ICD-10-CM | POA: Diagnosis not present

## 2023-07-04 DIAGNOSIS — Z48817 Encounter for surgical aftercare following surgery on the skin and subcutaneous tissue: Secondary | ICD-10-CM | POA: Diagnosis not present

## 2023-07-04 DIAGNOSIS — I87312 Chronic venous hypertension (idiopathic) with ulcer of left lower extremity: Secondary | ICD-10-CM | POA: Diagnosis not present

## 2023-07-04 DIAGNOSIS — L97822 Non-pressure chronic ulcer of other part of left lower leg with fat layer exposed: Secondary | ICD-10-CM | POA: Diagnosis not present

## 2023-07-06 DIAGNOSIS — I87321 Chronic venous hypertension (idiopathic) with inflammation of right lower extremity: Secondary | ICD-10-CM | POA: Diagnosis not present

## 2023-07-06 DIAGNOSIS — Z48817 Encounter for surgical aftercare following surgery on the skin and subcutaneous tissue: Secondary | ICD-10-CM | POA: Diagnosis not present

## 2023-07-06 DIAGNOSIS — M810 Age-related osteoporosis without current pathological fracture: Secondary | ICD-10-CM | POA: Diagnosis not present

## 2023-07-06 DIAGNOSIS — I890 Lymphedema, not elsewhere classified: Secondary | ICD-10-CM | POA: Diagnosis not present

## 2023-07-06 DIAGNOSIS — L97822 Non-pressure chronic ulcer of other part of left lower leg with fat layer exposed: Secondary | ICD-10-CM | POA: Diagnosis not present

## 2023-07-06 DIAGNOSIS — I87312 Chronic venous hypertension (idiopathic) with ulcer of left lower extremity: Secondary | ICD-10-CM | POA: Diagnosis not present

## 2023-07-09 DIAGNOSIS — L97822 Non-pressure chronic ulcer of other part of left lower leg with fat layer exposed: Secondary | ICD-10-CM | POA: Diagnosis not present

## 2023-07-09 DIAGNOSIS — M810 Age-related osteoporosis without current pathological fracture: Secondary | ICD-10-CM | POA: Diagnosis not present

## 2023-07-09 DIAGNOSIS — Z48817 Encounter for surgical aftercare following surgery on the skin and subcutaneous tissue: Secondary | ICD-10-CM | POA: Diagnosis not present

## 2023-07-09 DIAGNOSIS — I890 Lymphedema, not elsewhere classified: Secondary | ICD-10-CM | POA: Diagnosis not present

## 2023-07-09 DIAGNOSIS — I87321 Chronic venous hypertension (idiopathic) with inflammation of right lower extremity: Secondary | ICD-10-CM | POA: Diagnosis not present

## 2023-07-09 DIAGNOSIS — I87312 Chronic venous hypertension (idiopathic) with ulcer of left lower extremity: Secondary | ICD-10-CM | POA: Diagnosis not present

## 2023-07-11 DIAGNOSIS — I10 Essential (primary) hypertension: Secondary | ICD-10-CM | POA: Diagnosis not present

## 2023-07-11 DIAGNOSIS — Z48817 Encounter for surgical aftercare following surgery on the skin and subcutaneous tissue: Secondary | ICD-10-CM | POA: Diagnosis not present

## 2023-07-11 DIAGNOSIS — I87321 Chronic venous hypertension (idiopathic) with inflammation of right lower extremity: Secondary | ICD-10-CM | POA: Diagnosis not present

## 2023-07-11 DIAGNOSIS — M170 Bilateral primary osteoarthritis of knee: Secondary | ICD-10-CM | POA: Diagnosis not present

## 2023-07-11 DIAGNOSIS — I878 Other specified disorders of veins: Secondary | ICD-10-CM | POA: Diagnosis not present

## 2023-07-11 DIAGNOSIS — I872 Venous insufficiency (chronic) (peripheral): Secondary | ICD-10-CM | POA: Diagnosis not present

## 2023-07-11 DIAGNOSIS — N3941 Urge incontinence: Secondary | ICD-10-CM | POA: Diagnosis not present

## 2023-07-11 DIAGNOSIS — Z13220 Encounter for screening for lipoid disorders: Secondary | ICD-10-CM | POA: Diagnosis not present

## 2023-07-11 DIAGNOSIS — I87312 Chronic venous hypertension (idiopathic) with ulcer of left lower extremity: Secondary | ICD-10-CM | POA: Diagnosis not present

## 2023-07-11 DIAGNOSIS — L97822 Non-pressure chronic ulcer of other part of left lower leg with fat layer exposed: Secondary | ICD-10-CM | POA: Diagnosis not present

## 2023-07-11 DIAGNOSIS — I890 Lymphedema, not elsewhere classified: Secondary | ICD-10-CM | POA: Diagnosis not present

## 2023-07-11 DIAGNOSIS — M810 Age-related osteoporosis without current pathological fracture: Secondary | ICD-10-CM | POA: Diagnosis not present

## 2023-07-13 DIAGNOSIS — L97822 Non-pressure chronic ulcer of other part of left lower leg with fat layer exposed: Secondary | ICD-10-CM | POA: Diagnosis not present

## 2023-07-13 DIAGNOSIS — M810 Age-related osteoporosis without current pathological fracture: Secondary | ICD-10-CM | POA: Diagnosis not present

## 2023-07-13 DIAGNOSIS — I87312 Chronic venous hypertension (idiopathic) with ulcer of left lower extremity: Secondary | ICD-10-CM | POA: Diagnosis not present

## 2023-07-13 DIAGNOSIS — Z48817 Encounter for surgical aftercare following surgery on the skin and subcutaneous tissue: Secondary | ICD-10-CM | POA: Diagnosis not present

## 2023-07-13 DIAGNOSIS — I890 Lymphedema, not elsewhere classified: Secondary | ICD-10-CM | POA: Diagnosis not present

## 2023-07-13 DIAGNOSIS — I87321 Chronic venous hypertension (idiopathic) with inflammation of right lower extremity: Secondary | ICD-10-CM | POA: Diagnosis not present

## 2023-07-15 DIAGNOSIS — Z791 Long term (current) use of non-steroidal anti-inflammatories (NSAID): Secondary | ICD-10-CM | POA: Diagnosis not present

## 2023-07-15 DIAGNOSIS — I87312 Chronic venous hypertension (idiopathic) with ulcer of left lower extremity: Secondary | ICD-10-CM | POA: Diagnosis not present

## 2023-07-15 DIAGNOSIS — Z556 Problems related to health literacy: Secondary | ICD-10-CM | POA: Diagnosis not present

## 2023-07-15 DIAGNOSIS — L97822 Non-pressure chronic ulcer of other part of left lower leg with fat layer exposed: Secondary | ICD-10-CM | POA: Diagnosis not present

## 2023-07-15 DIAGNOSIS — Z79899 Other long term (current) drug therapy: Secondary | ICD-10-CM | POA: Diagnosis not present

## 2023-07-15 DIAGNOSIS — M810 Age-related osteoporosis without current pathological fracture: Secondary | ICD-10-CM | POA: Diagnosis not present

## 2023-07-15 DIAGNOSIS — I890 Lymphedema, not elsewhere classified: Secondary | ICD-10-CM | POA: Diagnosis not present

## 2023-07-15 DIAGNOSIS — Z48817 Encounter for surgical aftercare following surgery on the skin and subcutaneous tissue: Secondary | ICD-10-CM | POA: Diagnosis not present

## 2023-07-15 DIAGNOSIS — I87321 Chronic venous hypertension (idiopathic) with inflammation of right lower extremity: Secondary | ICD-10-CM | POA: Diagnosis not present

## 2023-07-16 DIAGNOSIS — I890 Lymphedema, not elsewhere classified: Secondary | ICD-10-CM | POA: Diagnosis not present

## 2023-07-16 DIAGNOSIS — M810 Age-related osteoporosis without current pathological fracture: Secondary | ICD-10-CM | POA: Diagnosis not present

## 2023-07-16 DIAGNOSIS — I87312 Chronic venous hypertension (idiopathic) with ulcer of left lower extremity: Secondary | ICD-10-CM | POA: Diagnosis not present

## 2023-07-16 DIAGNOSIS — I87321 Chronic venous hypertension (idiopathic) with inflammation of right lower extremity: Secondary | ICD-10-CM | POA: Diagnosis not present

## 2023-07-16 DIAGNOSIS — Z48817 Encounter for surgical aftercare following surgery on the skin and subcutaneous tissue: Secondary | ICD-10-CM | POA: Diagnosis not present

## 2023-07-16 DIAGNOSIS — L97822 Non-pressure chronic ulcer of other part of left lower leg with fat layer exposed: Secondary | ICD-10-CM | POA: Diagnosis not present

## 2023-07-18 DIAGNOSIS — I87312 Chronic venous hypertension (idiopathic) with ulcer of left lower extremity: Secondary | ICD-10-CM | POA: Diagnosis not present

## 2023-07-18 DIAGNOSIS — M810 Age-related osteoporosis without current pathological fracture: Secondary | ICD-10-CM | POA: Diagnosis not present

## 2023-07-18 DIAGNOSIS — I87321 Chronic venous hypertension (idiopathic) with inflammation of right lower extremity: Secondary | ICD-10-CM | POA: Diagnosis not present

## 2023-07-18 DIAGNOSIS — Z48817 Encounter for surgical aftercare following surgery on the skin and subcutaneous tissue: Secondary | ICD-10-CM | POA: Diagnosis not present

## 2023-07-18 DIAGNOSIS — I890 Lymphedema, not elsewhere classified: Secondary | ICD-10-CM | POA: Diagnosis not present

## 2023-07-18 DIAGNOSIS — L97822 Non-pressure chronic ulcer of other part of left lower leg with fat layer exposed: Secondary | ICD-10-CM | POA: Diagnosis not present

## 2023-07-20 DIAGNOSIS — I87321 Chronic venous hypertension (idiopathic) with inflammation of right lower extremity: Secondary | ICD-10-CM | POA: Diagnosis not present

## 2023-07-20 DIAGNOSIS — L97822 Non-pressure chronic ulcer of other part of left lower leg with fat layer exposed: Secondary | ICD-10-CM | POA: Diagnosis not present

## 2023-07-20 DIAGNOSIS — I890 Lymphedema, not elsewhere classified: Secondary | ICD-10-CM | POA: Diagnosis not present

## 2023-07-20 DIAGNOSIS — Z48817 Encounter for surgical aftercare following surgery on the skin and subcutaneous tissue: Secondary | ICD-10-CM | POA: Diagnosis not present

## 2023-07-20 DIAGNOSIS — I87312 Chronic venous hypertension (idiopathic) with ulcer of left lower extremity: Secondary | ICD-10-CM | POA: Diagnosis not present

## 2023-07-20 DIAGNOSIS — M810 Age-related osteoporosis without current pathological fracture: Secondary | ICD-10-CM | POA: Diagnosis not present

## 2023-07-23 DIAGNOSIS — I87321 Chronic venous hypertension (idiopathic) with inflammation of right lower extremity: Secondary | ICD-10-CM | POA: Diagnosis not present

## 2023-07-23 DIAGNOSIS — Z48817 Encounter for surgical aftercare following surgery on the skin and subcutaneous tissue: Secondary | ICD-10-CM | POA: Diagnosis not present

## 2023-07-23 DIAGNOSIS — L97822 Non-pressure chronic ulcer of other part of left lower leg with fat layer exposed: Secondary | ICD-10-CM | POA: Diagnosis not present

## 2023-07-23 DIAGNOSIS — I87312 Chronic venous hypertension (idiopathic) with ulcer of left lower extremity: Secondary | ICD-10-CM | POA: Diagnosis not present

## 2023-07-23 DIAGNOSIS — M810 Age-related osteoporosis without current pathological fracture: Secondary | ICD-10-CM | POA: Diagnosis not present

## 2023-07-23 DIAGNOSIS — I890 Lymphedema, not elsewhere classified: Secondary | ICD-10-CM | POA: Diagnosis not present

## 2023-07-25 DIAGNOSIS — L97822 Non-pressure chronic ulcer of other part of left lower leg with fat layer exposed: Secondary | ICD-10-CM | POA: Diagnosis not present

## 2023-07-25 DIAGNOSIS — I87321 Chronic venous hypertension (idiopathic) with inflammation of right lower extremity: Secondary | ICD-10-CM | POA: Diagnosis not present

## 2023-07-25 DIAGNOSIS — I87312 Chronic venous hypertension (idiopathic) with ulcer of left lower extremity: Secondary | ICD-10-CM | POA: Diagnosis not present

## 2023-07-25 DIAGNOSIS — Z48817 Encounter for surgical aftercare following surgery on the skin and subcutaneous tissue: Secondary | ICD-10-CM | POA: Diagnosis not present

## 2023-07-25 DIAGNOSIS — I890 Lymphedema, not elsewhere classified: Secondary | ICD-10-CM | POA: Diagnosis not present

## 2023-07-25 DIAGNOSIS — M810 Age-related osteoporosis without current pathological fracture: Secondary | ICD-10-CM | POA: Diagnosis not present

## 2023-07-27 DIAGNOSIS — I890 Lymphedema, not elsewhere classified: Secondary | ICD-10-CM | POA: Diagnosis not present

## 2023-07-27 DIAGNOSIS — I87321 Chronic venous hypertension (idiopathic) with inflammation of right lower extremity: Secondary | ICD-10-CM | POA: Diagnosis not present

## 2023-07-27 DIAGNOSIS — L97822 Non-pressure chronic ulcer of other part of left lower leg with fat layer exposed: Secondary | ICD-10-CM | POA: Diagnosis not present

## 2023-07-27 DIAGNOSIS — I87312 Chronic venous hypertension (idiopathic) with ulcer of left lower extremity: Secondary | ICD-10-CM | POA: Diagnosis not present

## 2023-07-27 DIAGNOSIS — Z48817 Encounter for surgical aftercare following surgery on the skin and subcutaneous tissue: Secondary | ICD-10-CM | POA: Diagnosis not present

## 2023-07-27 DIAGNOSIS — M810 Age-related osteoporosis without current pathological fracture: Secondary | ICD-10-CM | POA: Diagnosis not present

## 2023-07-30 DIAGNOSIS — I87312 Chronic venous hypertension (idiopathic) with ulcer of left lower extremity: Secondary | ICD-10-CM | POA: Diagnosis not present

## 2023-07-30 DIAGNOSIS — M810 Age-related osteoporosis without current pathological fracture: Secondary | ICD-10-CM | POA: Diagnosis not present

## 2023-07-30 DIAGNOSIS — Z48817 Encounter for surgical aftercare following surgery on the skin and subcutaneous tissue: Secondary | ICD-10-CM | POA: Diagnosis not present

## 2023-07-30 DIAGNOSIS — L97822 Non-pressure chronic ulcer of other part of left lower leg with fat layer exposed: Secondary | ICD-10-CM | POA: Diagnosis not present

## 2023-07-30 DIAGNOSIS — I87321 Chronic venous hypertension (idiopathic) with inflammation of right lower extremity: Secondary | ICD-10-CM | POA: Diagnosis not present

## 2023-07-30 DIAGNOSIS — I890 Lymphedema, not elsewhere classified: Secondary | ICD-10-CM | POA: Diagnosis not present

## 2023-08-01 DIAGNOSIS — I87321 Chronic venous hypertension (idiopathic) with inflammation of right lower extremity: Secondary | ICD-10-CM | POA: Diagnosis not present

## 2023-08-01 DIAGNOSIS — I87312 Chronic venous hypertension (idiopathic) with ulcer of left lower extremity: Secondary | ICD-10-CM | POA: Diagnosis not present

## 2023-08-01 DIAGNOSIS — M810 Age-related osteoporosis without current pathological fracture: Secondary | ICD-10-CM | POA: Diagnosis not present

## 2023-08-01 DIAGNOSIS — I890 Lymphedema, not elsewhere classified: Secondary | ICD-10-CM | POA: Diagnosis not present

## 2023-08-01 DIAGNOSIS — Z48817 Encounter for surgical aftercare following surgery on the skin and subcutaneous tissue: Secondary | ICD-10-CM | POA: Diagnosis not present

## 2023-08-01 DIAGNOSIS — L97822 Non-pressure chronic ulcer of other part of left lower leg with fat layer exposed: Secondary | ICD-10-CM | POA: Diagnosis not present

## 2023-08-02 DIAGNOSIS — B351 Tinea unguium: Secondary | ICD-10-CM | POA: Diagnosis not present

## 2023-08-02 DIAGNOSIS — L84 Corns and callosities: Secondary | ICD-10-CM | POA: Diagnosis not present

## 2023-08-02 DIAGNOSIS — L600 Ingrowing nail: Secondary | ICD-10-CM | POA: Diagnosis not present

## 2023-08-02 DIAGNOSIS — I739 Peripheral vascular disease, unspecified: Secondary | ICD-10-CM | POA: Diagnosis not present

## 2023-08-02 DIAGNOSIS — M2041 Other hammer toe(s) (acquired), right foot: Secondary | ICD-10-CM | POA: Diagnosis not present

## 2023-08-02 DIAGNOSIS — M79672 Pain in left foot: Secondary | ICD-10-CM | POA: Diagnosis not present

## 2023-08-02 DIAGNOSIS — M79671 Pain in right foot: Secondary | ICD-10-CM | POA: Diagnosis not present

## 2023-08-02 DIAGNOSIS — M2042 Other hammer toe(s) (acquired), left foot: Secondary | ICD-10-CM | POA: Diagnosis not present

## 2023-08-03 DIAGNOSIS — L97521 Non-pressure chronic ulcer of other part of left foot limited to breakdown of skin: Secondary | ICD-10-CM | POA: Diagnosis not present

## 2023-08-03 DIAGNOSIS — A498 Other bacterial infections of unspecified site: Secondary | ICD-10-CM | POA: Diagnosis not present

## 2023-08-06 DIAGNOSIS — M810 Age-related osteoporosis without current pathological fracture: Secondary | ICD-10-CM | POA: Diagnosis not present

## 2023-08-06 DIAGNOSIS — Z48817 Encounter for surgical aftercare following surgery on the skin and subcutaneous tissue: Secondary | ICD-10-CM | POA: Diagnosis not present

## 2023-08-06 DIAGNOSIS — L97822 Non-pressure chronic ulcer of other part of left lower leg with fat layer exposed: Secondary | ICD-10-CM | POA: Diagnosis not present

## 2023-08-06 DIAGNOSIS — I890 Lymphedema, not elsewhere classified: Secondary | ICD-10-CM | POA: Diagnosis not present

## 2023-08-06 DIAGNOSIS — I87312 Chronic venous hypertension (idiopathic) with ulcer of left lower extremity: Secondary | ICD-10-CM | POA: Diagnosis not present

## 2023-08-06 DIAGNOSIS — I87321 Chronic venous hypertension (idiopathic) with inflammation of right lower extremity: Secondary | ICD-10-CM | POA: Diagnosis not present

## 2023-08-08 DIAGNOSIS — Z48817 Encounter for surgical aftercare following surgery on the skin and subcutaneous tissue: Secondary | ICD-10-CM | POA: Diagnosis not present

## 2023-08-08 DIAGNOSIS — I890 Lymphedema, not elsewhere classified: Secondary | ICD-10-CM | POA: Diagnosis not present

## 2023-08-08 DIAGNOSIS — I87321 Chronic venous hypertension (idiopathic) with inflammation of right lower extremity: Secondary | ICD-10-CM | POA: Diagnosis not present

## 2023-08-08 DIAGNOSIS — M810 Age-related osteoporosis without current pathological fracture: Secondary | ICD-10-CM | POA: Diagnosis not present

## 2023-08-08 DIAGNOSIS — I87312 Chronic venous hypertension (idiopathic) with ulcer of left lower extremity: Secondary | ICD-10-CM | POA: Diagnosis not present

## 2023-08-08 DIAGNOSIS — L97822 Non-pressure chronic ulcer of other part of left lower leg with fat layer exposed: Secondary | ICD-10-CM | POA: Diagnosis not present

## 2023-08-10 DIAGNOSIS — I890 Lymphedema, not elsewhere classified: Secondary | ICD-10-CM | POA: Diagnosis not present

## 2023-08-10 DIAGNOSIS — M810 Age-related osteoporosis without current pathological fracture: Secondary | ICD-10-CM | POA: Diagnosis not present

## 2023-08-10 DIAGNOSIS — I87321 Chronic venous hypertension (idiopathic) with inflammation of right lower extremity: Secondary | ICD-10-CM | POA: Diagnosis not present

## 2023-08-10 DIAGNOSIS — Z48817 Encounter for surgical aftercare following surgery on the skin and subcutaneous tissue: Secondary | ICD-10-CM | POA: Diagnosis not present

## 2023-08-10 DIAGNOSIS — I87312 Chronic venous hypertension (idiopathic) with ulcer of left lower extremity: Secondary | ICD-10-CM | POA: Diagnosis not present

## 2023-08-10 DIAGNOSIS — L97822 Non-pressure chronic ulcer of other part of left lower leg with fat layer exposed: Secondary | ICD-10-CM | POA: Diagnosis not present

## 2023-08-13 DIAGNOSIS — I87321 Chronic venous hypertension (idiopathic) with inflammation of right lower extremity: Secondary | ICD-10-CM | POA: Diagnosis not present

## 2023-08-13 DIAGNOSIS — L97822 Non-pressure chronic ulcer of other part of left lower leg with fat layer exposed: Secondary | ICD-10-CM | POA: Diagnosis not present

## 2023-08-13 DIAGNOSIS — I87312 Chronic venous hypertension (idiopathic) with ulcer of left lower extremity: Secondary | ICD-10-CM | POA: Diagnosis not present

## 2023-08-13 DIAGNOSIS — Z48817 Encounter for surgical aftercare following surgery on the skin and subcutaneous tissue: Secondary | ICD-10-CM | POA: Diagnosis not present

## 2023-08-13 DIAGNOSIS — I890 Lymphedema, not elsewhere classified: Secondary | ICD-10-CM | POA: Diagnosis not present

## 2023-08-13 DIAGNOSIS — M810 Age-related osteoporosis without current pathological fracture: Secondary | ICD-10-CM | POA: Diagnosis not present

## 2023-08-14 DIAGNOSIS — I87312 Chronic venous hypertension (idiopathic) with ulcer of left lower extremity: Secondary | ICD-10-CM | POA: Diagnosis not present

## 2023-08-14 DIAGNOSIS — Z556 Problems related to health literacy: Secondary | ICD-10-CM | POA: Diagnosis not present

## 2023-08-14 DIAGNOSIS — L97822 Non-pressure chronic ulcer of other part of left lower leg with fat layer exposed: Secondary | ICD-10-CM | POA: Diagnosis not present

## 2023-08-14 DIAGNOSIS — Z79899 Other long term (current) drug therapy: Secondary | ICD-10-CM | POA: Diagnosis not present

## 2023-08-14 DIAGNOSIS — Z48817 Encounter for surgical aftercare following surgery on the skin and subcutaneous tissue: Secondary | ICD-10-CM | POA: Diagnosis not present

## 2023-08-14 DIAGNOSIS — Z791 Long term (current) use of non-steroidal anti-inflammatories (NSAID): Secondary | ICD-10-CM | POA: Diagnosis not present

## 2023-08-14 DIAGNOSIS — M810 Age-related osteoporosis without current pathological fracture: Secondary | ICD-10-CM | POA: Diagnosis not present

## 2023-08-14 DIAGNOSIS — I890 Lymphedema, not elsewhere classified: Secondary | ICD-10-CM | POA: Diagnosis not present

## 2023-08-14 DIAGNOSIS — I87321 Chronic venous hypertension (idiopathic) with inflammation of right lower extremity: Secondary | ICD-10-CM | POA: Diagnosis not present

## 2023-08-15 DIAGNOSIS — I890 Lymphedema, not elsewhere classified: Secondary | ICD-10-CM | POA: Diagnosis not present

## 2023-08-15 DIAGNOSIS — I87312 Chronic venous hypertension (idiopathic) with ulcer of left lower extremity: Secondary | ICD-10-CM | POA: Diagnosis not present

## 2023-08-15 DIAGNOSIS — I87321 Chronic venous hypertension (idiopathic) with inflammation of right lower extremity: Secondary | ICD-10-CM | POA: Diagnosis not present

## 2023-08-15 DIAGNOSIS — L97822 Non-pressure chronic ulcer of other part of left lower leg with fat layer exposed: Secondary | ICD-10-CM | POA: Diagnosis not present

## 2023-08-15 DIAGNOSIS — M810 Age-related osteoporosis without current pathological fracture: Secondary | ICD-10-CM | POA: Diagnosis not present

## 2023-08-15 DIAGNOSIS — Z48817 Encounter for surgical aftercare following surgery on the skin and subcutaneous tissue: Secondary | ICD-10-CM | POA: Diagnosis not present

## 2023-08-17 DIAGNOSIS — I890 Lymphedema, not elsewhere classified: Secondary | ICD-10-CM | POA: Diagnosis not present

## 2023-08-17 DIAGNOSIS — I87321 Chronic venous hypertension (idiopathic) with inflammation of right lower extremity: Secondary | ICD-10-CM | POA: Diagnosis not present

## 2023-08-17 DIAGNOSIS — M810 Age-related osteoporosis without current pathological fracture: Secondary | ICD-10-CM | POA: Diagnosis not present

## 2023-08-17 DIAGNOSIS — Z48817 Encounter for surgical aftercare following surgery on the skin and subcutaneous tissue: Secondary | ICD-10-CM | POA: Diagnosis not present

## 2023-08-17 DIAGNOSIS — I87312 Chronic venous hypertension (idiopathic) with ulcer of left lower extremity: Secondary | ICD-10-CM | POA: Diagnosis not present

## 2023-08-17 DIAGNOSIS — L97822 Non-pressure chronic ulcer of other part of left lower leg with fat layer exposed: Secondary | ICD-10-CM | POA: Diagnosis not present

## 2023-08-20 DIAGNOSIS — I87312 Chronic venous hypertension (idiopathic) with ulcer of left lower extremity: Secondary | ICD-10-CM | POA: Diagnosis not present

## 2023-08-20 DIAGNOSIS — L97822 Non-pressure chronic ulcer of other part of left lower leg with fat layer exposed: Secondary | ICD-10-CM | POA: Diagnosis not present

## 2023-08-20 DIAGNOSIS — Z48817 Encounter for surgical aftercare following surgery on the skin and subcutaneous tissue: Secondary | ICD-10-CM | POA: Diagnosis not present

## 2023-08-20 DIAGNOSIS — I87321 Chronic venous hypertension (idiopathic) with inflammation of right lower extremity: Secondary | ICD-10-CM | POA: Diagnosis not present

## 2023-08-20 DIAGNOSIS — I890 Lymphedema, not elsewhere classified: Secondary | ICD-10-CM | POA: Diagnosis not present

## 2023-08-20 DIAGNOSIS — M810 Age-related osteoporosis without current pathological fracture: Secondary | ICD-10-CM | POA: Diagnosis not present

## 2023-08-22 DIAGNOSIS — M810 Age-related osteoporosis without current pathological fracture: Secondary | ICD-10-CM | POA: Diagnosis not present

## 2023-08-22 DIAGNOSIS — I87312 Chronic venous hypertension (idiopathic) with ulcer of left lower extremity: Secondary | ICD-10-CM | POA: Diagnosis not present

## 2023-08-22 DIAGNOSIS — I890 Lymphedema, not elsewhere classified: Secondary | ICD-10-CM | POA: Diagnosis not present

## 2023-08-22 DIAGNOSIS — L97822 Non-pressure chronic ulcer of other part of left lower leg with fat layer exposed: Secondary | ICD-10-CM | POA: Diagnosis not present

## 2023-08-22 DIAGNOSIS — Z48817 Encounter for surgical aftercare following surgery on the skin and subcutaneous tissue: Secondary | ICD-10-CM | POA: Diagnosis not present

## 2023-08-22 DIAGNOSIS — I87321 Chronic venous hypertension (idiopathic) with inflammation of right lower extremity: Secondary | ICD-10-CM | POA: Diagnosis not present

## 2023-08-24 DIAGNOSIS — I890 Lymphedema, not elsewhere classified: Secondary | ICD-10-CM | POA: Diagnosis not present

## 2023-08-24 DIAGNOSIS — L97822 Non-pressure chronic ulcer of other part of left lower leg with fat layer exposed: Secondary | ICD-10-CM | POA: Diagnosis not present

## 2023-08-24 DIAGNOSIS — I87312 Chronic venous hypertension (idiopathic) with ulcer of left lower extremity: Secondary | ICD-10-CM | POA: Diagnosis not present

## 2023-08-24 DIAGNOSIS — I87321 Chronic venous hypertension (idiopathic) with inflammation of right lower extremity: Secondary | ICD-10-CM | POA: Diagnosis not present

## 2023-08-24 DIAGNOSIS — M810 Age-related osteoporosis without current pathological fracture: Secondary | ICD-10-CM | POA: Diagnosis not present

## 2023-08-24 DIAGNOSIS — Z48817 Encounter for surgical aftercare following surgery on the skin and subcutaneous tissue: Secondary | ICD-10-CM | POA: Diagnosis not present

## 2023-08-27 DIAGNOSIS — M810 Age-related osteoporosis without current pathological fracture: Secondary | ICD-10-CM | POA: Diagnosis not present

## 2023-08-27 DIAGNOSIS — I890 Lymphedema, not elsewhere classified: Secondary | ICD-10-CM | POA: Diagnosis not present

## 2023-08-27 DIAGNOSIS — I87321 Chronic venous hypertension (idiopathic) with inflammation of right lower extremity: Secondary | ICD-10-CM | POA: Diagnosis not present

## 2023-08-27 DIAGNOSIS — L97822 Non-pressure chronic ulcer of other part of left lower leg with fat layer exposed: Secondary | ICD-10-CM | POA: Diagnosis not present

## 2023-08-27 DIAGNOSIS — Z48817 Encounter for surgical aftercare following surgery on the skin and subcutaneous tissue: Secondary | ICD-10-CM | POA: Diagnosis not present

## 2023-08-27 DIAGNOSIS — I87312 Chronic venous hypertension (idiopathic) with ulcer of left lower extremity: Secondary | ICD-10-CM | POA: Diagnosis not present

## 2023-08-29 DIAGNOSIS — L97822 Non-pressure chronic ulcer of other part of left lower leg with fat layer exposed: Secondary | ICD-10-CM | POA: Diagnosis not present

## 2023-08-29 DIAGNOSIS — M810 Age-related osteoporosis without current pathological fracture: Secondary | ICD-10-CM | POA: Diagnosis not present

## 2023-08-29 DIAGNOSIS — I890 Lymphedema, not elsewhere classified: Secondary | ICD-10-CM | POA: Diagnosis not present

## 2023-08-29 DIAGNOSIS — I87321 Chronic venous hypertension (idiopathic) with inflammation of right lower extremity: Secondary | ICD-10-CM | POA: Diagnosis not present

## 2023-08-29 DIAGNOSIS — I87312 Chronic venous hypertension (idiopathic) with ulcer of left lower extremity: Secondary | ICD-10-CM | POA: Diagnosis not present

## 2023-08-29 DIAGNOSIS — Z48817 Encounter for surgical aftercare following surgery on the skin and subcutaneous tissue: Secondary | ICD-10-CM | POA: Diagnosis not present

## 2023-08-31 DIAGNOSIS — Z48817 Encounter for surgical aftercare following surgery on the skin and subcutaneous tissue: Secondary | ICD-10-CM | POA: Diagnosis not present

## 2023-08-31 DIAGNOSIS — I890 Lymphedema, not elsewhere classified: Secondary | ICD-10-CM | POA: Diagnosis not present

## 2023-08-31 DIAGNOSIS — L97822 Non-pressure chronic ulcer of other part of left lower leg with fat layer exposed: Secondary | ICD-10-CM | POA: Diagnosis not present

## 2023-08-31 DIAGNOSIS — I87312 Chronic venous hypertension (idiopathic) with ulcer of left lower extremity: Secondary | ICD-10-CM | POA: Diagnosis not present

## 2023-08-31 DIAGNOSIS — M810 Age-related osteoporosis without current pathological fracture: Secondary | ICD-10-CM | POA: Diagnosis not present

## 2023-08-31 DIAGNOSIS — I87321 Chronic venous hypertension (idiopathic) with inflammation of right lower extremity: Secondary | ICD-10-CM | POA: Diagnosis not present

## 2023-09-03 DIAGNOSIS — I87321 Chronic venous hypertension (idiopathic) with inflammation of right lower extremity: Secondary | ICD-10-CM | POA: Diagnosis not present

## 2023-09-03 DIAGNOSIS — I87312 Chronic venous hypertension (idiopathic) with ulcer of left lower extremity: Secondary | ICD-10-CM | POA: Diagnosis not present

## 2023-09-03 DIAGNOSIS — L97822 Non-pressure chronic ulcer of other part of left lower leg with fat layer exposed: Secondary | ICD-10-CM | POA: Diagnosis not present

## 2023-09-03 DIAGNOSIS — Z48817 Encounter for surgical aftercare following surgery on the skin and subcutaneous tissue: Secondary | ICD-10-CM | POA: Diagnosis not present

## 2023-09-03 DIAGNOSIS — M810 Age-related osteoporosis without current pathological fracture: Secondary | ICD-10-CM | POA: Diagnosis not present

## 2023-09-03 DIAGNOSIS — I890 Lymphedema, not elsewhere classified: Secondary | ICD-10-CM | POA: Diagnosis not present

## 2023-09-05 DIAGNOSIS — M810 Age-related osteoporosis without current pathological fracture: Secondary | ICD-10-CM | POA: Diagnosis not present

## 2023-09-05 DIAGNOSIS — L97822 Non-pressure chronic ulcer of other part of left lower leg with fat layer exposed: Secondary | ICD-10-CM | POA: Diagnosis not present

## 2023-09-05 DIAGNOSIS — I890 Lymphedema, not elsewhere classified: Secondary | ICD-10-CM | POA: Diagnosis not present

## 2023-09-05 DIAGNOSIS — Z48817 Encounter for surgical aftercare following surgery on the skin and subcutaneous tissue: Secondary | ICD-10-CM | POA: Diagnosis not present

## 2023-09-05 DIAGNOSIS — I87312 Chronic venous hypertension (idiopathic) with ulcer of left lower extremity: Secondary | ICD-10-CM | POA: Diagnosis not present

## 2023-09-05 DIAGNOSIS — I87321 Chronic venous hypertension (idiopathic) with inflammation of right lower extremity: Secondary | ICD-10-CM | POA: Diagnosis not present

## 2023-09-07 DIAGNOSIS — I87321 Chronic venous hypertension (idiopathic) with inflammation of right lower extremity: Secondary | ICD-10-CM | POA: Diagnosis not present

## 2023-09-07 DIAGNOSIS — M810 Age-related osteoporosis without current pathological fracture: Secondary | ICD-10-CM | POA: Diagnosis not present

## 2023-09-07 DIAGNOSIS — L97822 Non-pressure chronic ulcer of other part of left lower leg with fat layer exposed: Secondary | ICD-10-CM | POA: Diagnosis not present

## 2023-09-07 DIAGNOSIS — I890 Lymphedema, not elsewhere classified: Secondary | ICD-10-CM | POA: Diagnosis not present

## 2023-09-07 DIAGNOSIS — Z48817 Encounter for surgical aftercare following surgery on the skin and subcutaneous tissue: Secondary | ICD-10-CM | POA: Diagnosis not present

## 2023-09-07 DIAGNOSIS — I87312 Chronic venous hypertension (idiopathic) with ulcer of left lower extremity: Secondary | ICD-10-CM | POA: Diagnosis not present

## 2023-09-10 DIAGNOSIS — I87312 Chronic venous hypertension (idiopathic) with ulcer of left lower extremity: Secondary | ICD-10-CM | POA: Diagnosis not present

## 2023-09-10 DIAGNOSIS — Z48817 Encounter for surgical aftercare following surgery on the skin and subcutaneous tissue: Secondary | ICD-10-CM | POA: Diagnosis not present

## 2023-09-10 DIAGNOSIS — I890 Lymphedema, not elsewhere classified: Secondary | ICD-10-CM | POA: Diagnosis not present

## 2023-09-10 DIAGNOSIS — I87321 Chronic venous hypertension (idiopathic) with inflammation of right lower extremity: Secondary | ICD-10-CM | POA: Diagnosis not present

## 2023-09-10 DIAGNOSIS — L97822 Non-pressure chronic ulcer of other part of left lower leg with fat layer exposed: Secondary | ICD-10-CM | POA: Diagnosis not present

## 2023-09-10 DIAGNOSIS — M810 Age-related osteoporosis without current pathological fracture: Secondary | ICD-10-CM | POA: Diagnosis not present

## 2023-09-12 DIAGNOSIS — I890 Lymphedema, not elsewhere classified: Secondary | ICD-10-CM | POA: Diagnosis not present

## 2023-09-12 DIAGNOSIS — Z48817 Encounter for surgical aftercare following surgery on the skin and subcutaneous tissue: Secondary | ICD-10-CM | POA: Diagnosis not present

## 2023-09-12 DIAGNOSIS — I87321 Chronic venous hypertension (idiopathic) with inflammation of right lower extremity: Secondary | ICD-10-CM | POA: Diagnosis not present

## 2023-09-12 DIAGNOSIS — L97822 Non-pressure chronic ulcer of other part of left lower leg with fat layer exposed: Secondary | ICD-10-CM | POA: Diagnosis not present

## 2023-09-12 DIAGNOSIS — I87312 Chronic venous hypertension (idiopathic) with ulcer of left lower extremity: Secondary | ICD-10-CM | POA: Diagnosis not present

## 2023-09-12 DIAGNOSIS — M810 Age-related osteoporosis without current pathological fracture: Secondary | ICD-10-CM | POA: Diagnosis not present

## 2023-09-13 DIAGNOSIS — Z556 Problems related to health literacy: Secondary | ICD-10-CM | POA: Diagnosis not present

## 2023-09-13 DIAGNOSIS — I890 Lymphedema, not elsewhere classified: Secondary | ICD-10-CM | POA: Diagnosis not present

## 2023-09-13 DIAGNOSIS — Z48817 Encounter for surgical aftercare following surgery on the skin and subcutaneous tissue: Secondary | ICD-10-CM | POA: Diagnosis not present

## 2023-09-13 DIAGNOSIS — I87312 Chronic venous hypertension (idiopathic) with ulcer of left lower extremity: Secondary | ICD-10-CM | POA: Diagnosis not present

## 2023-09-13 DIAGNOSIS — L97822 Non-pressure chronic ulcer of other part of left lower leg with fat layer exposed: Secondary | ICD-10-CM | POA: Diagnosis not present

## 2023-09-13 DIAGNOSIS — Z791 Long term (current) use of non-steroidal anti-inflammatories (NSAID): Secondary | ICD-10-CM | POA: Diagnosis not present

## 2023-09-13 DIAGNOSIS — Z79899 Other long term (current) drug therapy: Secondary | ICD-10-CM | POA: Diagnosis not present

## 2023-09-13 DIAGNOSIS — I87321 Chronic venous hypertension (idiopathic) with inflammation of right lower extremity: Secondary | ICD-10-CM | POA: Diagnosis not present

## 2023-09-13 DIAGNOSIS — M810 Age-related osteoporosis without current pathological fracture: Secondary | ICD-10-CM | POA: Diagnosis not present

## 2023-09-14 DIAGNOSIS — M810 Age-related osteoporosis without current pathological fracture: Secondary | ICD-10-CM | POA: Diagnosis not present

## 2023-09-14 DIAGNOSIS — I890 Lymphedema, not elsewhere classified: Secondary | ICD-10-CM | POA: Diagnosis not present

## 2023-09-14 DIAGNOSIS — I87312 Chronic venous hypertension (idiopathic) with ulcer of left lower extremity: Secondary | ICD-10-CM | POA: Diagnosis not present

## 2023-09-14 DIAGNOSIS — I87321 Chronic venous hypertension (idiopathic) with inflammation of right lower extremity: Secondary | ICD-10-CM | POA: Diagnosis not present

## 2023-09-14 DIAGNOSIS — Z48817 Encounter for surgical aftercare following surgery on the skin and subcutaneous tissue: Secondary | ICD-10-CM | POA: Diagnosis not present

## 2023-09-14 DIAGNOSIS — L97822 Non-pressure chronic ulcer of other part of left lower leg with fat layer exposed: Secondary | ICD-10-CM | POA: Diagnosis not present

## 2023-09-17 DIAGNOSIS — I890 Lymphedema, not elsewhere classified: Secondary | ICD-10-CM | POA: Diagnosis not present

## 2023-09-17 DIAGNOSIS — L97822 Non-pressure chronic ulcer of other part of left lower leg with fat layer exposed: Secondary | ICD-10-CM | POA: Diagnosis not present

## 2023-09-17 DIAGNOSIS — I87321 Chronic venous hypertension (idiopathic) with inflammation of right lower extremity: Secondary | ICD-10-CM | POA: Diagnosis not present

## 2023-09-17 DIAGNOSIS — M810 Age-related osteoporosis without current pathological fracture: Secondary | ICD-10-CM | POA: Diagnosis not present

## 2023-09-17 DIAGNOSIS — I87312 Chronic venous hypertension (idiopathic) with ulcer of left lower extremity: Secondary | ICD-10-CM | POA: Diagnosis not present

## 2023-09-17 DIAGNOSIS — Z48817 Encounter for surgical aftercare following surgery on the skin and subcutaneous tissue: Secondary | ICD-10-CM | POA: Diagnosis not present

## 2023-09-19 DIAGNOSIS — L97822 Non-pressure chronic ulcer of other part of left lower leg with fat layer exposed: Secondary | ICD-10-CM | POA: Diagnosis not present

## 2023-09-19 DIAGNOSIS — M810 Age-related osteoporosis without current pathological fracture: Secondary | ICD-10-CM | POA: Diagnosis not present

## 2023-09-19 DIAGNOSIS — I87321 Chronic venous hypertension (idiopathic) with inflammation of right lower extremity: Secondary | ICD-10-CM | POA: Diagnosis not present

## 2023-09-19 DIAGNOSIS — I87312 Chronic venous hypertension (idiopathic) with ulcer of left lower extremity: Secondary | ICD-10-CM | POA: Diagnosis not present

## 2023-09-19 DIAGNOSIS — Z48817 Encounter for surgical aftercare following surgery on the skin and subcutaneous tissue: Secondary | ICD-10-CM | POA: Diagnosis not present

## 2023-09-19 DIAGNOSIS — I890 Lymphedema, not elsewhere classified: Secondary | ICD-10-CM | POA: Diagnosis not present

## 2023-09-21 DIAGNOSIS — Z48817 Encounter for surgical aftercare following surgery on the skin and subcutaneous tissue: Secondary | ICD-10-CM | POA: Diagnosis not present

## 2023-09-21 DIAGNOSIS — I87321 Chronic venous hypertension (idiopathic) with inflammation of right lower extremity: Secondary | ICD-10-CM | POA: Diagnosis not present

## 2023-09-21 DIAGNOSIS — M810 Age-related osteoporosis without current pathological fracture: Secondary | ICD-10-CM | POA: Diagnosis not present

## 2023-09-21 DIAGNOSIS — I87312 Chronic venous hypertension (idiopathic) with ulcer of left lower extremity: Secondary | ICD-10-CM | POA: Diagnosis not present

## 2023-09-21 DIAGNOSIS — I890 Lymphedema, not elsewhere classified: Secondary | ICD-10-CM | POA: Diagnosis not present

## 2023-09-21 DIAGNOSIS — L97822 Non-pressure chronic ulcer of other part of left lower leg with fat layer exposed: Secondary | ICD-10-CM | POA: Diagnosis not present

## 2023-09-24 DIAGNOSIS — I890 Lymphedema, not elsewhere classified: Secondary | ICD-10-CM | POA: Diagnosis not present

## 2023-09-24 DIAGNOSIS — L97822 Non-pressure chronic ulcer of other part of left lower leg with fat layer exposed: Secondary | ICD-10-CM | POA: Diagnosis not present

## 2023-09-24 DIAGNOSIS — I87321 Chronic venous hypertension (idiopathic) with inflammation of right lower extremity: Secondary | ICD-10-CM | POA: Diagnosis not present

## 2023-09-24 DIAGNOSIS — Z48817 Encounter for surgical aftercare following surgery on the skin and subcutaneous tissue: Secondary | ICD-10-CM | POA: Diagnosis not present

## 2023-09-24 DIAGNOSIS — I87312 Chronic venous hypertension (idiopathic) with ulcer of left lower extremity: Secondary | ICD-10-CM | POA: Diagnosis not present

## 2023-09-24 DIAGNOSIS — M810 Age-related osteoporosis without current pathological fracture: Secondary | ICD-10-CM | POA: Diagnosis not present

## 2023-09-26 DIAGNOSIS — I10 Essential (primary) hypertension: Secondary | ICD-10-CM | POA: Diagnosis not present

## 2023-09-26 DIAGNOSIS — I87321 Chronic venous hypertension (idiopathic) with inflammation of right lower extremity: Secondary | ICD-10-CM | POA: Diagnosis not present

## 2023-09-26 DIAGNOSIS — Z48817 Encounter for surgical aftercare following surgery on the skin and subcutaneous tissue: Secondary | ICD-10-CM | POA: Diagnosis not present

## 2023-09-26 DIAGNOSIS — M170 Bilateral primary osteoarthritis of knee: Secondary | ICD-10-CM | POA: Diagnosis not present

## 2023-09-26 DIAGNOSIS — L97822 Non-pressure chronic ulcer of other part of left lower leg with fat layer exposed: Secondary | ICD-10-CM | POA: Diagnosis not present

## 2023-09-26 DIAGNOSIS — I87312 Chronic venous hypertension (idiopathic) with ulcer of left lower extremity: Secondary | ICD-10-CM | POA: Diagnosis not present

## 2023-09-26 DIAGNOSIS — I890 Lymphedema, not elsewhere classified: Secondary | ICD-10-CM | POA: Diagnosis not present

## 2023-09-26 DIAGNOSIS — M810 Age-related osteoporosis without current pathological fracture: Secondary | ICD-10-CM | POA: Diagnosis not present

## 2023-09-28 DIAGNOSIS — I890 Lymphedema, not elsewhere classified: Secondary | ICD-10-CM | POA: Diagnosis not present

## 2023-09-28 DIAGNOSIS — Z48817 Encounter for surgical aftercare following surgery on the skin and subcutaneous tissue: Secondary | ICD-10-CM | POA: Diagnosis not present

## 2023-09-28 DIAGNOSIS — M810 Age-related osteoporosis without current pathological fracture: Secondary | ICD-10-CM | POA: Diagnosis not present

## 2023-09-28 DIAGNOSIS — I87312 Chronic venous hypertension (idiopathic) with ulcer of left lower extremity: Secondary | ICD-10-CM | POA: Diagnosis not present

## 2023-09-28 DIAGNOSIS — L97822 Non-pressure chronic ulcer of other part of left lower leg with fat layer exposed: Secondary | ICD-10-CM | POA: Diagnosis not present

## 2023-09-28 DIAGNOSIS — I87321 Chronic venous hypertension (idiopathic) with inflammation of right lower extremity: Secondary | ICD-10-CM | POA: Diagnosis not present

## 2023-10-01 DIAGNOSIS — I87312 Chronic venous hypertension (idiopathic) with ulcer of left lower extremity: Secondary | ICD-10-CM | POA: Diagnosis not present

## 2023-10-01 DIAGNOSIS — I890 Lymphedema, not elsewhere classified: Secondary | ICD-10-CM | POA: Diagnosis not present

## 2023-10-01 DIAGNOSIS — I87321 Chronic venous hypertension (idiopathic) with inflammation of right lower extremity: Secondary | ICD-10-CM | POA: Diagnosis not present

## 2023-10-01 DIAGNOSIS — L97822 Non-pressure chronic ulcer of other part of left lower leg with fat layer exposed: Secondary | ICD-10-CM | POA: Diagnosis not present

## 2023-10-01 DIAGNOSIS — Z48817 Encounter for surgical aftercare following surgery on the skin and subcutaneous tissue: Secondary | ICD-10-CM | POA: Diagnosis not present

## 2023-10-01 DIAGNOSIS — M810 Age-related osteoporosis without current pathological fracture: Secondary | ICD-10-CM | POA: Diagnosis not present

## 2023-10-03 DIAGNOSIS — I87312 Chronic venous hypertension (idiopathic) with ulcer of left lower extremity: Secondary | ICD-10-CM | POA: Diagnosis not present

## 2023-10-03 DIAGNOSIS — L97822 Non-pressure chronic ulcer of other part of left lower leg with fat layer exposed: Secondary | ICD-10-CM | POA: Diagnosis not present

## 2023-10-03 DIAGNOSIS — Z48817 Encounter for surgical aftercare following surgery on the skin and subcutaneous tissue: Secondary | ICD-10-CM | POA: Diagnosis not present

## 2023-10-03 DIAGNOSIS — M810 Age-related osteoporosis without current pathological fracture: Secondary | ICD-10-CM | POA: Diagnosis not present

## 2023-10-03 DIAGNOSIS — I87321 Chronic venous hypertension (idiopathic) with inflammation of right lower extremity: Secondary | ICD-10-CM | POA: Diagnosis not present

## 2023-10-03 DIAGNOSIS — I890 Lymphedema, not elsewhere classified: Secondary | ICD-10-CM | POA: Diagnosis not present

## 2023-10-05 DIAGNOSIS — Z48817 Encounter for surgical aftercare following surgery on the skin and subcutaneous tissue: Secondary | ICD-10-CM | POA: Diagnosis not present

## 2023-10-05 DIAGNOSIS — M810 Age-related osteoporosis without current pathological fracture: Secondary | ICD-10-CM | POA: Diagnosis not present

## 2023-10-05 DIAGNOSIS — I890 Lymphedema, not elsewhere classified: Secondary | ICD-10-CM | POA: Diagnosis not present

## 2023-10-05 DIAGNOSIS — L97822 Non-pressure chronic ulcer of other part of left lower leg with fat layer exposed: Secondary | ICD-10-CM | POA: Diagnosis not present

## 2023-10-05 DIAGNOSIS — I87312 Chronic venous hypertension (idiopathic) with ulcer of left lower extremity: Secondary | ICD-10-CM | POA: Diagnosis not present

## 2023-10-05 DIAGNOSIS — I87321 Chronic venous hypertension (idiopathic) with inflammation of right lower extremity: Secondary | ICD-10-CM | POA: Diagnosis not present

## 2023-10-08 DIAGNOSIS — M810 Age-related osteoporosis without current pathological fracture: Secondary | ICD-10-CM | POA: Diagnosis not present

## 2023-10-08 DIAGNOSIS — I87321 Chronic venous hypertension (idiopathic) with inflammation of right lower extremity: Secondary | ICD-10-CM | POA: Diagnosis not present

## 2023-10-08 DIAGNOSIS — I890 Lymphedema, not elsewhere classified: Secondary | ICD-10-CM | POA: Diagnosis not present

## 2023-10-08 DIAGNOSIS — L97822 Non-pressure chronic ulcer of other part of left lower leg with fat layer exposed: Secondary | ICD-10-CM | POA: Diagnosis not present

## 2023-10-08 DIAGNOSIS — Z48817 Encounter for surgical aftercare following surgery on the skin and subcutaneous tissue: Secondary | ICD-10-CM | POA: Diagnosis not present

## 2023-10-08 DIAGNOSIS — I87312 Chronic venous hypertension (idiopathic) with ulcer of left lower extremity: Secondary | ICD-10-CM | POA: Diagnosis not present

## 2023-10-10 DIAGNOSIS — M810 Age-related osteoporosis without current pathological fracture: Secondary | ICD-10-CM | POA: Diagnosis not present

## 2023-10-10 DIAGNOSIS — G8929 Other chronic pain: Secondary | ICD-10-CM | POA: Diagnosis not present

## 2023-10-10 DIAGNOSIS — L97921 Non-pressure chronic ulcer of unspecified part of left lower leg limited to breakdown of skin: Secondary | ICD-10-CM | POA: Diagnosis not present

## 2023-10-10 DIAGNOSIS — M19072 Primary osteoarthritis, left ankle and foot: Secondary | ICD-10-CM | POA: Diagnosis not present

## 2023-10-10 DIAGNOSIS — I878 Other specified disorders of veins: Secondary | ICD-10-CM | POA: Diagnosis not present

## 2023-10-10 DIAGNOSIS — Z8614 Personal history of Methicillin resistant Staphylococcus aureus infection: Secondary | ICD-10-CM | POA: Diagnosis not present

## 2023-10-10 DIAGNOSIS — I10 Essential (primary) hypertension: Secondary | ICD-10-CM | POA: Diagnosis not present

## 2023-10-10 DIAGNOSIS — M25562 Pain in left knee: Secondary | ICD-10-CM | POA: Diagnosis not present

## 2023-10-10 DIAGNOSIS — L97328 Non-pressure chronic ulcer of left ankle with other specified severity: Secondary | ICD-10-CM | POA: Diagnosis not present

## 2023-10-10 DIAGNOSIS — M25561 Pain in right knee: Secondary | ICD-10-CM | POA: Diagnosis not present

## 2023-10-10 DIAGNOSIS — Z8619 Personal history of other infectious and parasitic diseases: Secondary | ICD-10-CM | POA: Diagnosis not present

## 2023-10-10 DIAGNOSIS — I890 Lymphedema, not elsewhere classified: Secondary | ICD-10-CM | POA: Diagnosis not present

## 2023-10-12 DIAGNOSIS — L97822 Non-pressure chronic ulcer of other part of left lower leg with fat layer exposed: Secondary | ICD-10-CM | POA: Diagnosis not present

## 2023-10-12 DIAGNOSIS — I87321 Chronic venous hypertension (idiopathic) with inflammation of right lower extremity: Secondary | ICD-10-CM | POA: Diagnosis not present

## 2023-10-12 DIAGNOSIS — Z48817 Encounter for surgical aftercare following surgery on the skin and subcutaneous tissue: Secondary | ICD-10-CM | POA: Diagnosis not present

## 2023-10-12 DIAGNOSIS — I890 Lymphedema, not elsewhere classified: Secondary | ICD-10-CM | POA: Diagnosis not present

## 2023-10-12 DIAGNOSIS — M810 Age-related osteoporosis without current pathological fracture: Secondary | ICD-10-CM | POA: Diagnosis not present

## 2023-10-12 DIAGNOSIS — I87312 Chronic venous hypertension (idiopathic) with ulcer of left lower extremity: Secondary | ICD-10-CM | POA: Diagnosis not present

## 2023-10-13 DIAGNOSIS — Z79899 Other long term (current) drug therapy: Secondary | ICD-10-CM | POA: Diagnosis not present

## 2023-10-13 DIAGNOSIS — M810 Age-related osteoporosis without current pathological fracture: Secondary | ICD-10-CM | POA: Diagnosis not present

## 2023-10-13 DIAGNOSIS — L97822 Non-pressure chronic ulcer of other part of left lower leg with fat layer exposed: Secondary | ICD-10-CM | POA: Diagnosis not present

## 2023-10-13 DIAGNOSIS — Z791 Long term (current) use of non-steroidal anti-inflammatories (NSAID): Secondary | ICD-10-CM | POA: Diagnosis not present

## 2023-10-13 DIAGNOSIS — I87312 Chronic venous hypertension (idiopathic) with ulcer of left lower extremity: Secondary | ICD-10-CM | POA: Diagnosis not present

## 2023-10-13 DIAGNOSIS — Z48817 Encounter for surgical aftercare following surgery on the skin and subcutaneous tissue: Secondary | ICD-10-CM | POA: Diagnosis not present

## 2023-10-13 DIAGNOSIS — Z556 Problems related to health literacy: Secondary | ICD-10-CM | POA: Diagnosis not present

## 2023-10-13 DIAGNOSIS — I890 Lymphedema, not elsewhere classified: Secondary | ICD-10-CM | POA: Diagnosis not present

## 2023-10-13 DIAGNOSIS — I87321 Chronic venous hypertension (idiopathic) with inflammation of right lower extremity: Secondary | ICD-10-CM | POA: Diagnosis not present

## 2023-10-15 DIAGNOSIS — Z48817 Encounter for surgical aftercare following surgery on the skin and subcutaneous tissue: Secondary | ICD-10-CM | POA: Diagnosis not present

## 2023-10-15 DIAGNOSIS — I87321 Chronic venous hypertension (idiopathic) with inflammation of right lower extremity: Secondary | ICD-10-CM | POA: Diagnosis not present

## 2023-10-15 DIAGNOSIS — M810 Age-related osteoporosis without current pathological fracture: Secondary | ICD-10-CM | POA: Diagnosis not present

## 2023-10-15 DIAGNOSIS — L97822 Non-pressure chronic ulcer of other part of left lower leg with fat layer exposed: Secondary | ICD-10-CM | POA: Diagnosis not present

## 2023-10-15 DIAGNOSIS — I890 Lymphedema, not elsewhere classified: Secondary | ICD-10-CM | POA: Diagnosis not present

## 2023-10-15 DIAGNOSIS — I87312 Chronic venous hypertension (idiopathic) with ulcer of left lower extremity: Secondary | ICD-10-CM | POA: Diagnosis not present

## 2023-10-17 DIAGNOSIS — M810 Age-related osteoporosis without current pathological fracture: Secondary | ICD-10-CM | POA: Diagnosis not present

## 2023-10-17 DIAGNOSIS — I87312 Chronic venous hypertension (idiopathic) with ulcer of left lower extremity: Secondary | ICD-10-CM | POA: Diagnosis not present

## 2023-10-17 DIAGNOSIS — I87321 Chronic venous hypertension (idiopathic) with inflammation of right lower extremity: Secondary | ICD-10-CM | POA: Diagnosis not present

## 2023-10-17 DIAGNOSIS — L97822 Non-pressure chronic ulcer of other part of left lower leg with fat layer exposed: Secondary | ICD-10-CM | POA: Diagnosis not present

## 2023-10-17 DIAGNOSIS — Z48817 Encounter for surgical aftercare following surgery on the skin and subcutaneous tissue: Secondary | ICD-10-CM | POA: Diagnosis not present

## 2023-10-17 DIAGNOSIS — I890 Lymphedema, not elsewhere classified: Secondary | ICD-10-CM | POA: Diagnosis not present

## 2023-10-19 DIAGNOSIS — M810 Age-related osteoporosis without current pathological fracture: Secondary | ICD-10-CM | POA: Diagnosis not present

## 2023-10-19 DIAGNOSIS — I87312 Chronic venous hypertension (idiopathic) with ulcer of left lower extremity: Secondary | ICD-10-CM | POA: Diagnosis not present

## 2023-10-19 DIAGNOSIS — I890 Lymphedema, not elsewhere classified: Secondary | ICD-10-CM | POA: Diagnosis not present

## 2023-10-19 DIAGNOSIS — I87321 Chronic venous hypertension (idiopathic) with inflammation of right lower extremity: Secondary | ICD-10-CM | POA: Diagnosis not present

## 2023-10-19 DIAGNOSIS — Z48817 Encounter for surgical aftercare following surgery on the skin and subcutaneous tissue: Secondary | ICD-10-CM | POA: Diagnosis not present

## 2023-10-19 DIAGNOSIS — L97822 Non-pressure chronic ulcer of other part of left lower leg with fat layer exposed: Secondary | ICD-10-CM | POA: Diagnosis not present

## 2023-10-22 DIAGNOSIS — L97822 Non-pressure chronic ulcer of other part of left lower leg with fat layer exposed: Secondary | ICD-10-CM | POA: Diagnosis not present

## 2023-10-22 DIAGNOSIS — M810 Age-related osteoporosis without current pathological fracture: Secondary | ICD-10-CM | POA: Diagnosis not present

## 2023-10-22 DIAGNOSIS — Z48817 Encounter for surgical aftercare following surgery on the skin and subcutaneous tissue: Secondary | ICD-10-CM | POA: Diagnosis not present

## 2023-10-22 DIAGNOSIS — I87312 Chronic venous hypertension (idiopathic) with ulcer of left lower extremity: Secondary | ICD-10-CM | POA: Diagnosis not present

## 2023-10-22 DIAGNOSIS — I87321 Chronic venous hypertension (idiopathic) with inflammation of right lower extremity: Secondary | ICD-10-CM | POA: Diagnosis not present

## 2023-10-22 DIAGNOSIS — I890 Lymphedema, not elsewhere classified: Secondary | ICD-10-CM | POA: Diagnosis not present

## 2023-10-24 DIAGNOSIS — M810 Age-related osteoporosis without current pathological fracture: Secondary | ICD-10-CM | POA: Diagnosis not present

## 2023-10-24 DIAGNOSIS — Z48817 Encounter for surgical aftercare following surgery on the skin and subcutaneous tissue: Secondary | ICD-10-CM | POA: Diagnosis not present

## 2023-10-24 DIAGNOSIS — I87321 Chronic venous hypertension (idiopathic) with inflammation of right lower extremity: Secondary | ICD-10-CM | POA: Diagnosis not present

## 2023-10-24 DIAGNOSIS — I890 Lymphedema, not elsewhere classified: Secondary | ICD-10-CM | POA: Diagnosis not present

## 2023-10-24 DIAGNOSIS — L97822 Non-pressure chronic ulcer of other part of left lower leg with fat layer exposed: Secondary | ICD-10-CM | POA: Diagnosis not present

## 2023-10-24 DIAGNOSIS — I87312 Chronic venous hypertension (idiopathic) with ulcer of left lower extremity: Secondary | ICD-10-CM | POA: Diagnosis not present

## 2023-10-25 DIAGNOSIS — I10 Essential (primary) hypertension: Secondary | ICD-10-CM | POA: Diagnosis not present

## 2023-10-25 DIAGNOSIS — M170 Bilateral primary osteoarthritis of knee: Secondary | ICD-10-CM | POA: Diagnosis not present

## 2023-10-26 DIAGNOSIS — I87312 Chronic venous hypertension (idiopathic) with ulcer of left lower extremity: Secondary | ICD-10-CM | POA: Diagnosis not present

## 2023-10-26 DIAGNOSIS — M810 Age-related osteoporosis without current pathological fracture: Secondary | ICD-10-CM | POA: Diagnosis not present

## 2023-10-26 DIAGNOSIS — L97822 Non-pressure chronic ulcer of other part of left lower leg with fat layer exposed: Secondary | ICD-10-CM | POA: Diagnosis not present

## 2023-10-26 DIAGNOSIS — I87321 Chronic venous hypertension (idiopathic) with inflammation of right lower extremity: Secondary | ICD-10-CM | POA: Diagnosis not present

## 2023-10-26 DIAGNOSIS — I890 Lymphedema, not elsewhere classified: Secondary | ICD-10-CM | POA: Diagnosis not present

## 2023-10-26 DIAGNOSIS — Z48817 Encounter for surgical aftercare following surgery on the skin and subcutaneous tissue: Secondary | ICD-10-CM | POA: Diagnosis not present

## 2023-10-29 DIAGNOSIS — M810 Age-related osteoporosis without current pathological fracture: Secondary | ICD-10-CM | POA: Diagnosis not present

## 2023-10-29 DIAGNOSIS — I87321 Chronic venous hypertension (idiopathic) with inflammation of right lower extremity: Secondary | ICD-10-CM | POA: Diagnosis not present

## 2023-10-29 DIAGNOSIS — I890 Lymphedema, not elsewhere classified: Secondary | ICD-10-CM | POA: Diagnosis not present

## 2023-10-29 DIAGNOSIS — Z23 Encounter for immunization: Secondary | ICD-10-CM | POA: Diagnosis not present

## 2023-10-29 DIAGNOSIS — L97822 Non-pressure chronic ulcer of other part of left lower leg with fat layer exposed: Secondary | ICD-10-CM | POA: Diagnosis not present

## 2023-10-29 DIAGNOSIS — Z48817 Encounter for surgical aftercare following surgery on the skin and subcutaneous tissue: Secondary | ICD-10-CM | POA: Diagnosis not present

## 2023-10-29 DIAGNOSIS — Z7185 Encounter for immunization safety counseling: Secondary | ICD-10-CM | POA: Diagnosis not present

## 2023-10-29 DIAGNOSIS — I87312 Chronic venous hypertension (idiopathic) with ulcer of left lower extremity: Secondary | ICD-10-CM | POA: Diagnosis not present

## 2023-10-30 DIAGNOSIS — M2041 Other hammer toe(s) (acquired), right foot: Secondary | ICD-10-CM | POA: Diagnosis not present

## 2023-10-30 DIAGNOSIS — L600 Ingrowing nail: Secondary | ICD-10-CM | POA: Diagnosis not present

## 2023-10-30 DIAGNOSIS — L84 Corns and callosities: Secondary | ICD-10-CM | POA: Diagnosis not present

## 2023-10-30 DIAGNOSIS — M79671 Pain in right foot: Secondary | ICD-10-CM | POA: Diagnosis not present

## 2023-10-30 DIAGNOSIS — I739 Peripheral vascular disease, unspecified: Secondary | ICD-10-CM | POA: Diagnosis not present

## 2023-10-30 DIAGNOSIS — M2042 Other hammer toe(s) (acquired), left foot: Secondary | ICD-10-CM | POA: Diagnosis not present

## 2023-10-30 DIAGNOSIS — L723 Sebaceous cyst: Secondary | ICD-10-CM | POA: Diagnosis not present

## 2023-10-30 DIAGNOSIS — F32A Depression, unspecified: Secondary | ICD-10-CM | POA: Diagnosis not present

## 2023-10-31 DIAGNOSIS — Z48817 Encounter for surgical aftercare following surgery on the skin and subcutaneous tissue: Secondary | ICD-10-CM | POA: Diagnosis not present

## 2023-10-31 DIAGNOSIS — I87321 Chronic venous hypertension (idiopathic) with inflammation of right lower extremity: Secondary | ICD-10-CM | POA: Diagnosis not present

## 2023-10-31 DIAGNOSIS — M810 Age-related osteoporosis without current pathological fracture: Secondary | ICD-10-CM | POA: Diagnosis not present

## 2023-10-31 DIAGNOSIS — L97822 Non-pressure chronic ulcer of other part of left lower leg with fat layer exposed: Secondary | ICD-10-CM | POA: Diagnosis not present

## 2023-10-31 DIAGNOSIS — I87312 Chronic venous hypertension (idiopathic) with ulcer of left lower extremity: Secondary | ICD-10-CM | POA: Diagnosis not present

## 2023-10-31 DIAGNOSIS — I890 Lymphedema, not elsewhere classified: Secondary | ICD-10-CM | POA: Diagnosis not present

## 2023-11-02 DIAGNOSIS — M810 Age-related osteoporosis without current pathological fracture: Secondary | ICD-10-CM | POA: Diagnosis not present

## 2023-11-02 DIAGNOSIS — I87312 Chronic venous hypertension (idiopathic) with ulcer of left lower extremity: Secondary | ICD-10-CM | POA: Diagnosis not present

## 2023-11-02 DIAGNOSIS — L97822 Non-pressure chronic ulcer of other part of left lower leg with fat layer exposed: Secondary | ICD-10-CM | POA: Diagnosis not present

## 2023-11-02 DIAGNOSIS — Z48817 Encounter for surgical aftercare following surgery on the skin and subcutaneous tissue: Secondary | ICD-10-CM | POA: Diagnosis not present

## 2023-11-02 DIAGNOSIS — I87321 Chronic venous hypertension (idiopathic) with inflammation of right lower extremity: Secondary | ICD-10-CM | POA: Diagnosis not present

## 2023-11-02 DIAGNOSIS — I890 Lymphedema, not elsewhere classified: Secondary | ICD-10-CM | POA: Diagnosis not present

## 2023-11-05 DIAGNOSIS — I890 Lymphedema, not elsewhere classified: Secondary | ICD-10-CM | POA: Diagnosis not present

## 2023-11-05 DIAGNOSIS — I87321 Chronic venous hypertension (idiopathic) with inflammation of right lower extremity: Secondary | ICD-10-CM | POA: Diagnosis not present

## 2023-11-05 DIAGNOSIS — Z48817 Encounter for surgical aftercare following surgery on the skin and subcutaneous tissue: Secondary | ICD-10-CM | POA: Diagnosis not present

## 2023-11-05 DIAGNOSIS — L97822 Non-pressure chronic ulcer of other part of left lower leg with fat layer exposed: Secondary | ICD-10-CM | POA: Diagnosis not present

## 2023-11-05 DIAGNOSIS — M810 Age-related osteoporosis without current pathological fracture: Secondary | ICD-10-CM | POA: Diagnosis not present

## 2023-11-05 DIAGNOSIS — I87312 Chronic venous hypertension (idiopathic) with ulcer of left lower extremity: Secondary | ICD-10-CM | POA: Diagnosis not present

## 2023-11-07 DIAGNOSIS — I87312 Chronic venous hypertension (idiopathic) with ulcer of left lower extremity: Secondary | ICD-10-CM | POA: Diagnosis not present

## 2023-11-07 DIAGNOSIS — I890 Lymphedema, not elsewhere classified: Secondary | ICD-10-CM | POA: Diagnosis not present

## 2023-11-07 DIAGNOSIS — I87321 Chronic venous hypertension (idiopathic) with inflammation of right lower extremity: Secondary | ICD-10-CM | POA: Diagnosis not present

## 2023-11-07 DIAGNOSIS — M810 Age-related osteoporosis without current pathological fracture: Secondary | ICD-10-CM | POA: Diagnosis not present

## 2023-11-07 DIAGNOSIS — L97822 Non-pressure chronic ulcer of other part of left lower leg with fat layer exposed: Secondary | ICD-10-CM | POA: Diagnosis not present

## 2023-11-07 DIAGNOSIS — Z48817 Encounter for surgical aftercare following surgery on the skin and subcutaneous tissue: Secondary | ICD-10-CM | POA: Diagnosis not present

## 2023-11-09 DIAGNOSIS — Z48817 Encounter for surgical aftercare following surgery on the skin and subcutaneous tissue: Secondary | ICD-10-CM | POA: Diagnosis not present

## 2023-11-09 DIAGNOSIS — I87312 Chronic venous hypertension (idiopathic) with ulcer of left lower extremity: Secondary | ICD-10-CM | POA: Diagnosis not present

## 2023-11-09 DIAGNOSIS — I890 Lymphedema, not elsewhere classified: Secondary | ICD-10-CM | POA: Diagnosis not present

## 2023-11-09 DIAGNOSIS — L97822 Non-pressure chronic ulcer of other part of left lower leg with fat layer exposed: Secondary | ICD-10-CM | POA: Diagnosis not present

## 2023-11-09 DIAGNOSIS — I87321 Chronic venous hypertension (idiopathic) with inflammation of right lower extremity: Secondary | ICD-10-CM | POA: Diagnosis not present

## 2023-11-09 DIAGNOSIS — M810 Age-related osteoporosis without current pathological fracture: Secondary | ICD-10-CM | POA: Diagnosis not present

## 2023-11-12 DIAGNOSIS — I890 Lymphedema, not elsewhere classified: Secondary | ICD-10-CM | POA: Diagnosis not present

## 2023-11-12 DIAGNOSIS — Z556 Problems related to health literacy: Secondary | ICD-10-CM | POA: Diagnosis not present

## 2023-11-12 DIAGNOSIS — Z48817 Encounter for surgical aftercare following surgery on the skin and subcutaneous tissue: Secondary | ICD-10-CM | POA: Diagnosis not present

## 2023-11-12 DIAGNOSIS — Z791 Long term (current) use of non-steroidal anti-inflammatories (NSAID): Secondary | ICD-10-CM | POA: Diagnosis not present

## 2023-11-12 DIAGNOSIS — I87321 Chronic venous hypertension (idiopathic) with inflammation of right lower extremity: Secondary | ICD-10-CM | POA: Diagnosis not present

## 2023-11-12 DIAGNOSIS — I87312 Chronic venous hypertension (idiopathic) with ulcer of left lower extremity: Secondary | ICD-10-CM | POA: Diagnosis not present

## 2023-11-12 DIAGNOSIS — L97822 Non-pressure chronic ulcer of other part of left lower leg with fat layer exposed: Secondary | ICD-10-CM | POA: Diagnosis not present

## 2023-11-12 DIAGNOSIS — M810 Age-related osteoporosis without current pathological fracture: Secondary | ICD-10-CM | POA: Diagnosis not present

## 2023-11-14 ENCOUNTER — Emergency Department (HOSPITAL_COMMUNITY)

## 2023-11-14 ENCOUNTER — Emergency Department (HOSPITAL_COMMUNITY): Admission: EM | Admit: 2023-11-14 | Discharge: 2023-11-14 | Disposition: A | Discharge status: Home / Self Care

## 2023-11-14 ENCOUNTER — Encounter (HOSPITAL_COMMUNITY): Payer: Self-pay

## 2023-11-14 ENCOUNTER — Other Ambulatory Visit: Payer: Self-pay

## 2023-11-14 DIAGNOSIS — R110 Nausea: Secondary | ICD-10-CM | POA: Insufficient documentation

## 2023-11-14 DIAGNOSIS — I700 Atherosclerosis of aorta: Secondary | ICD-10-CM | POA: Diagnosis not present

## 2023-11-14 DIAGNOSIS — R0781 Pleurodynia: Secondary | ICD-10-CM | POA: Diagnosis not present

## 2023-11-14 DIAGNOSIS — R0789 Other chest pain: Secondary | ICD-10-CM

## 2023-11-14 MED ORDER — METHOCARBAMOL 500 MG PO TABS: 500.0000 mg | ORAL_TABLET | Freq: Two times a day (BID) | ORAL | 0 refills | Status: AC

## 2023-11-14 MED ORDER — METHOCARBAMOL 500 MG PO TABS
500.0000 mg | ORAL_TABLET | Freq: Once | ORAL | Status: AC
  Administered 2023-11-14: 500 mg via ORAL
  Filled 2023-11-14: qty 1

## 2023-11-14 MED ORDER — ONDANSETRON 4 MG PO TBDP
4.0000 mg | ORAL_TABLET | Freq: Once | ORAL | Status: AC
  Administered 2023-11-14: 4 mg via ORAL
  Filled 2023-11-14: qty 1

## 2023-11-14 MED ORDER — ONDANSETRON HCL 4 MG PO TABS: 4.0000 mg | ORAL_TABLET | Freq: Three times a day (TID) | ORAL | 0 refills | Status: AC | PRN

## 2023-11-14 NOTE — ED Provider Notes (Signed)
 Kayla Reynolds EMERGENCY DEPARTMENT AT [facility] Provider Note   CSN: [id number] Arrival date & time: 11/14/23  1447     Patient presents with: rib cage pain   Kayla Reynolds is a 78 y.o. female.   78 year old female presents for evaluation of rib pain.  States she has had left-sided rib pain for few days.  States she is taking mildly and Tums and that has not helped her symptoms.  She denies any falls or injury.  Denies any rashes.  Denies any other symptoms or concerns at this time.  Denies any shortness of breath, chest pain, diarrhea, or any other symptoms or concerns.  Does admit to occasional nausea.        Prior to Admission medications   Medication Sig Start Date End Date Taking? Authorizing Provider  methocarbamol (ROBAXIN) 500 MG tablet Take 1 tablet (500 mg total) by mouth 2 (two) times daily. 11/14/23  Yes Jazzmine Kleiman L, DO  ondansetron (ZOFRAN) 4 MG tablet Take 1 tablet (4 mg total) by mouth every 8 (eight) hours as needed for up to 4 days for nausea. 11/14/23 11/18/23 Yes Ceasia Elwell L, DO  acetaminophen  (TYLENOL ) 500 MG tablet Take 500 mg by mouth every 6 (six) hours as needed.    [provider]  amLODipine  (NORVASC ) 5 MG tablet Take 5 mg by mouth daily. 07/12/20   [provider]  Cholecalciferol 25 MCG (1000 UT) capsule Take 5,000 Units by mouth daily. 12/07/15   [provider]  diclofenac  (VOLTAREN ) 75 MG EC tablet Take 75 mg by mouth 2 (two) times daily.    [provider]  Emollient (RESTA) CREA Apply 1 application topically daily.    [provider]  furosemide (LASIX) 20 MG tablet as needed.    [provider]  ibandronate (BONIVA) 150 MG tablet Take 150 mg by mouth every 30 (thirty) days. 09/22/20   [provider]  triamcinolone  cream (KENALOG ) 0.1 % Apply 1 application topically daily as needed (itching).    [provider]    Allergies: Asa [aspirin], Penicillins, and  Sulfa antibiotics    Review of Systems  Constitutional:  Negative for chills and fever.  HENT:  Negative for ear pain and sore throat.   Eyes:  Negative for pain and visual disturbance.  Respiratory:  Negative for cough and shortness of breath.   Cardiovascular:  Negative for chest pain and palpitations.  Gastrointestinal:  Positive for nausea. Negative for abdominal pain and vomiting.  Genitourinary:  Negative for dysuria and hematuria.  Musculoskeletal:  Negative for arthralgias and back pain.       Rib pain  Skin:  Negative for color change and rash.  Neurological:  Negative for seizures and syncope.  All other systems reviewed and are negative.   Updated Vital Signs BP 132/60 (BP Location: Left Arm)   Pulse 74   Temp 98.3 F (36.8 C) (Oral)   Resp 18   Ht 5' 3 (1.6 m)   Wt 77 kg   SpO2 96%   BMI 30.07 kg/m   Physical Exam Vitals and nursing note reviewed.  Constitutional:      General: She is not in acute distress.    Appearance: Normal appearance. She is well-developed. She is not ill-appearing.  HENT:     Head: Normocephalic and atraumatic.  Eyes:     Conjunctiva/sclera: Conjunctivae normal.  Cardiovascular:     Rate and Rhythm: Normal rate and regular rhythm.     Heart  sounds: No murmur heard. Pulmonary:     Effort: Pulmonary effort is normal. No respiratory distress.     Breath sounds: Normal breath sounds.  Abdominal:     Palpations: Abdomen is soft.     Tenderness: There is no abdominal tenderness.  Musculoskeletal:        General: No swelling.     Cervical back: Neck supple.     Comments: Tenderness to palpation over the left lower ribs, no abd tenderness   Skin:    General: Skin is warm and dry.     Capillary Refill: Capillary refill takes less than 2 seconds.  Neurological:     Mental Status: She is alert.  Psychiatric:        Mood and Affect: Mood normal.     (all labs ordered are listed, but only abnormal results are displayed) Labs  Reviewed - No data to display  EKG: None  Radiology: DG Ribs Unilateral W/Chest Left Result Date: 11/14/2023 EXAM: AP VIEW(S) XRAY OF THE LEFT RIBS AND CHEST 11/14/2023 03:40:00 PM COMPARISON: None available. CLINICAL HISTORY: pain. Table formatting from the original note was not included.; Images from the original note were not included.; Per triage; Pt arrived via [facility] with family who reports Pt was laying down yesterday when left rib cage pain began. Pt denies injury. Pts family report Pt has been experiencing nausea lately and has limited mobility at home. FINDINGS: BONES: No acute displaced rib fracture. LUNGS AND PLEURA: No consolidation or pulmonary edema. No pleural effusion or pneumothorax. HEART AND MEDIASTINUM: Aortic atherosclerosis. No acute abnormality of the cardiac and mediastinal silhouettes. JOINTS: Severe degenerative changes of bilateral shoulders. DISCS/DEGENERATIVE CHANGES: Thoracic dextrocurvature. IMPRESSION: 1. No acute rib fracture. 2. No acute cardiopulmonary abnormalities. Electronically signed by: Waddell Calk MD 11/14/2023 03:52 PM EDT RP Workstation: [id number]     Procedures   Medications Ordered in the ED  ondansetron (ZOFRAN-ODT) disintegrating tablet 4 mg (4 mg Oral Given 11/14/23 1702)  methocarbamol (ROBAXIN) tablet 500 mg (500 mg Oral Given 11/14/23 1702)                                    Medical Decision Making 78 year old female presents for nontraumatic left rib pain for few days.  She has occasional nausea as well.  She appears well with stable vitals.  Rib x-ray unremarkable.  She has some reproducible rib pain on exam but no abdominal pain.  Will give her some Zofran to use as needed for nausea and start her on Robaxin.  Advised to use Tylenol  as needed for pain and obtain close follow-up with primary care otherwise return to the ER for new or worsening symptoms.  Feels comfortable with plan be discharged home.  Results of care discussed with  patient and family at bedside.  Problems Addressed: Nausea: acute illness or injury Rib pain on left side: acute illness or injury  Amount and/or Complexity of Data Reviewed External Data Reviewed: notes.    Details: Outpatient records reviewed from 2017-2025 patient has chronic diabetic ulcer Radiology: ordered and independent interpretation performed. Decision-making details documented in ED Course.    Details: Ordered and interpreted by me independently of radiology From x-ray: Shows no acute abnormality  Risk OTC drugs. Prescription drug management.     Final diagnoses:  Rib pain on left side  Nausea    ED Discharge Orders  Ordered    ondansetron (ZOFRAN) 4 MG tablet  Every 8 hours PRN        11/14/23 1656    methocarbamol (ROBAXIN) 500 MG tablet  2 times daily        11/14/23 1656               Kehaulani Fruin L, DO 11/14/23 1704

## 2023-11-14 NOTE — ED Triage Notes (Signed)
 Pt arrived via [facility] with family who reports Pt was laying down yesterday when left rib cage pain began. Pt denies injury. Pts family report Pt has been experiencing nausea lately and has limited mobility at home.

## 2023-11-14 NOTE — Discharge Instructions (Addendum)
 You can take Tylenol  as needed till today and take Robaxin twice a day as needed for pain.  Watch out for rash and follow-up with your primary care doctor or return to the ER if you develop 1.  You also use Zofran as needed for nausea and vomiting.  Call your primary care doctor's office and follow-up in 1 to 2 weeks.  Otherwise return to the ER for new or worsening symptoms

## 2023-11-15 DIAGNOSIS — I878 Other specified disorders of veins: Secondary | ICD-10-CM | POA: Diagnosis not present

## 2023-11-15 DIAGNOSIS — I10 Essential (primary) hypertension: Secondary | ICD-10-CM | POA: Diagnosis not present

## 2023-11-15 DIAGNOSIS — N3941 Urge incontinence: Secondary | ICD-10-CM | POA: Diagnosis not present

## 2023-11-15 DIAGNOSIS — M170 Bilateral primary osteoarthritis of knee: Secondary | ICD-10-CM | POA: Diagnosis not present

## 2023-11-15 DIAGNOSIS — I872 Venous insufficiency (chronic) (peripheral): Secondary | ICD-10-CM | POA: Diagnosis not present

## 2023-11-15 DIAGNOSIS — F5101 Primary insomnia: Secondary | ICD-10-CM | POA: Diagnosis not present

## 2023-11-15 DIAGNOSIS — S91302A Unspecified open wound, left foot, initial encounter: Secondary | ICD-10-CM | POA: Diagnosis not present

## 2023-11-16 DIAGNOSIS — Z48817 Encounter for surgical aftercare following surgery on the skin and subcutaneous tissue: Secondary | ICD-10-CM | POA: Diagnosis not present

## 2023-11-16 DIAGNOSIS — L97822 Non-pressure chronic ulcer of other part of left lower leg with fat layer exposed: Secondary | ICD-10-CM | POA: Diagnosis not present

## 2023-11-16 DIAGNOSIS — I87312 Chronic venous hypertension (idiopathic) with ulcer of left lower extremity: Secondary | ICD-10-CM | POA: Diagnosis not present

## 2023-11-16 DIAGNOSIS — M810 Age-related osteoporosis without current pathological fracture: Secondary | ICD-10-CM | POA: Diagnosis not present

## 2023-11-16 DIAGNOSIS — I87321 Chronic venous hypertension (idiopathic) with inflammation of right lower extremity: Secondary | ICD-10-CM | POA: Diagnosis not present

## 2023-11-16 DIAGNOSIS — I890 Lymphedema, not elsewhere classified: Secondary | ICD-10-CM | POA: Diagnosis not present

## 2023-11-19 DIAGNOSIS — Z48817 Encounter for surgical aftercare following surgery on the skin and subcutaneous tissue: Secondary | ICD-10-CM | POA: Diagnosis not present

## 2023-11-19 DIAGNOSIS — I890 Lymphedema, not elsewhere classified: Secondary | ICD-10-CM | POA: Diagnosis not present

## 2023-11-19 DIAGNOSIS — I87312 Chronic venous hypertension (idiopathic) with ulcer of left lower extremity: Secondary | ICD-10-CM | POA: Diagnosis not present

## 2023-11-19 DIAGNOSIS — I87321 Chronic venous hypertension (idiopathic) with inflammation of right lower extremity: Secondary | ICD-10-CM | POA: Diagnosis not present

## 2023-11-19 DIAGNOSIS — M810 Age-related osteoporosis without current pathological fracture: Secondary | ICD-10-CM | POA: Diagnosis not present

## 2023-11-19 DIAGNOSIS — L97822 Non-pressure chronic ulcer of other part of left lower leg with fat layer exposed: Secondary | ICD-10-CM | POA: Diagnosis not present

## 2023-11-21 DIAGNOSIS — I87312 Chronic venous hypertension (idiopathic) with ulcer of left lower extremity: Secondary | ICD-10-CM | POA: Diagnosis not present

## 2023-11-21 DIAGNOSIS — Z48817 Encounter for surgical aftercare following surgery on the skin and subcutaneous tissue: Secondary | ICD-10-CM | POA: Diagnosis not present

## 2023-11-21 DIAGNOSIS — I87321 Chronic venous hypertension (idiopathic) with inflammation of right lower extremity: Secondary | ICD-10-CM | POA: Diagnosis not present

## 2023-11-21 DIAGNOSIS — L97822 Non-pressure chronic ulcer of other part of left lower leg with fat layer exposed: Secondary | ICD-10-CM | POA: Diagnosis not present

## 2023-11-21 DIAGNOSIS — I890 Lymphedema, not elsewhere classified: Secondary | ICD-10-CM | POA: Diagnosis not present

## 2023-11-21 DIAGNOSIS — M810 Age-related osteoporosis without current pathological fracture: Secondary | ICD-10-CM | POA: Diagnosis not present

## 2023-11-22 DIAGNOSIS — Z91018 Allergy to other foods: Secondary | ICD-10-CM | POA: Diagnosis not present

## 2023-11-22 DIAGNOSIS — L97822 Non-pressure chronic ulcer of other part of left lower leg with fat layer exposed: Secondary | ICD-10-CM | POA: Diagnosis not present

## 2023-11-22 DIAGNOSIS — I10 Essential (primary) hypertension: Secondary | ICD-10-CM | POA: Diagnosis not present

## 2023-11-22 DIAGNOSIS — Z886 Allergy status to analgesic agent status: Secondary | ICD-10-CM | POA: Diagnosis not present

## 2023-11-22 DIAGNOSIS — Z87891 Personal history of nicotine dependence: Secondary | ICD-10-CM | POA: Diagnosis not present

## 2023-11-22 DIAGNOSIS — I87312 Chronic venous hypertension (idiopathic) with ulcer of left lower extremity: Secondary | ICD-10-CM | POA: Diagnosis not present

## 2023-11-22 DIAGNOSIS — Z880 Allergy status to penicillin: Secondary | ICD-10-CM | POA: Diagnosis not present

## 2023-11-26 DIAGNOSIS — I87312 Chronic venous hypertension (idiopathic) with ulcer of left lower extremity: Secondary | ICD-10-CM | POA: Diagnosis not present

## 2023-11-26 DIAGNOSIS — M810 Age-related osteoporosis without current pathological fracture: Secondary | ICD-10-CM | POA: Diagnosis not present

## 2023-11-26 DIAGNOSIS — L97822 Non-pressure chronic ulcer of other part of left lower leg with fat layer exposed: Secondary | ICD-10-CM | POA: Diagnosis not present

## 2023-11-26 DIAGNOSIS — Z48817 Encounter for surgical aftercare following surgery on the skin and subcutaneous tissue: Secondary | ICD-10-CM | POA: Diagnosis not present

## 2023-11-26 DIAGNOSIS — I890 Lymphedema, not elsewhere classified: Secondary | ICD-10-CM | POA: Diagnosis not present

## 2023-11-26 DIAGNOSIS — I87321 Chronic venous hypertension (idiopathic) with inflammation of right lower extremity: Secondary | ICD-10-CM | POA: Diagnosis not present

## 2023-11-29 DIAGNOSIS — I87312 Chronic venous hypertension (idiopathic) with ulcer of left lower extremity: Secondary | ICD-10-CM | POA: Diagnosis not present

## 2023-11-29 DIAGNOSIS — Z87891 Personal history of nicotine dependence: Secondary | ICD-10-CM | POA: Diagnosis not present

## 2023-11-29 DIAGNOSIS — Z880 Allergy status to penicillin: Secondary | ICD-10-CM | POA: Diagnosis not present

## 2023-11-29 DIAGNOSIS — L97812 Non-pressure chronic ulcer of other part of right lower leg with fat layer exposed: Secondary | ICD-10-CM | POA: Diagnosis not present

## 2023-11-29 DIAGNOSIS — I10 Essential (primary) hypertension: Secondary | ICD-10-CM | POA: Diagnosis not present

## 2023-11-29 DIAGNOSIS — Z886 Allergy status to analgesic agent status: Secondary | ICD-10-CM | POA: Diagnosis not present

## 2023-11-29 DIAGNOSIS — Z91018 Allergy to other foods: Secondary | ICD-10-CM | POA: Diagnosis not present

## 2023-11-29 DIAGNOSIS — L97822 Non-pressure chronic ulcer of other part of left lower leg with fat layer exposed: Secondary | ICD-10-CM | POA: Diagnosis not present

## 2023-11-30 DIAGNOSIS — I87321 Chronic venous hypertension (idiopathic) with inflammation of right lower extremity: Secondary | ICD-10-CM | POA: Diagnosis not present

## 2023-11-30 DIAGNOSIS — I87312 Chronic venous hypertension (idiopathic) with ulcer of left lower extremity: Secondary | ICD-10-CM | POA: Diagnosis not present

## 2023-11-30 DIAGNOSIS — M810 Age-related osteoporosis without current pathological fracture: Secondary | ICD-10-CM | POA: Diagnosis not present

## 2023-11-30 DIAGNOSIS — I890 Lymphedema, not elsewhere classified: Secondary | ICD-10-CM | POA: Diagnosis not present

## 2023-11-30 DIAGNOSIS — L97822 Non-pressure chronic ulcer of other part of left lower leg with fat layer exposed: Secondary | ICD-10-CM | POA: Diagnosis not present

## 2023-11-30 DIAGNOSIS — Z48817 Encounter for surgical aftercare following surgery on the skin and subcutaneous tissue: Secondary | ICD-10-CM | POA: Diagnosis not present

## 2023-12-03 DIAGNOSIS — L97822 Non-pressure chronic ulcer of other part of left lower leg with fat layer exposed: Secondary | ICD-10-CM | POA: Diagnosis not present

## 2023-12-03 DIAGNOSIS — I87312 Chronic venous hypertension (idiopathic) with ulcer of left lower extremity: Secondary | ICD-10-CM | POA: Diagnosis not present

## 2023-12-03 DIAGNOSIS — I890 Lymphedema, not elsewhere classified: Secondary | ICD-10-CM | POA: Diagnosis not present

## 2023-12-03 DIAGNOSIS — Z48817 Encounter for surgical aftercare following surgery on the skin and subcutaneous tissue: Secondary | ICD-10-CM | POA: Diagnosis not present

## 2023-12-03 DIAGNOSIS — M810 Age-related osteoporosis without current pathological fracture: Secondary | ICD-10-CM | POA: Diagnosis not present

## 2023-12-03 DIAGNOSIS — I87321 Chronic venous hypertension (idiopathic) with inflammation of right lower extremity: Secondary | ICD-10-CM | POA: Diagnosis not present

## 2023-12-04 DIAGNOSIS — M170 Bilateral primary osteoarthritis of knee: Secondary | ICD-10-CM | POA: Diagnosis not present

## 2023-12-04 DIAGNOSIS — I10 Essential (primary) hypertension: Secondary | ICD-10-CM | POA: Diagnosis not present

## 2023-12-05 DIAGNOSIS — I87312 Chronic venous hypertension (idiopathic) with ulcer of left lower extremity: Secondary | ICD-10-CM | POA: Diagnosis not present

## 2023-12-06 DIAGNOSIS — Z886 Allergy status to analgesic agent status: Secondary | ICD-10-CM | POA: Diagnosis not present

## 2023-12-06 DIAGNOSIS — I87312 Chronic venous hypertension (idiopathic) with ulcer of left lower extremity: Secondary | ICD-10-CM | POA: Diagnosis not present

## 2023-12-06 DIAGNOSIS — Z91018 Allergy to other foods: Secondary | ICD-10-CM | POA: Diagnosis not present

## 2023-12-06 DIAGNOSIS — Z880 Allergy status to penicillin: Secondary | ICD-10-CM | POA: Diagnosis not present

## 2023-12-06 DIAGNOSIS — Z87891 Personal history of nicotine dependence: Secondary | ICD-10-CM | POA: Diagnosis not present

## 2023-12-06 DIAGNOSIS — I10 Essential (primary) hypertension: Secondary | ICD-10-CM | POA: Diagnosis not present

## 2023-12-06 DIAGNOSIS — L97822 Non-pressure chronic ulcer of other part of left lower leg with fat layer exposed: Secondary | ICD-10-CM | POA: Diagnosis not present

## 2023-12-07 DIAGNOSIS — I87321 Chronic venous hypertension (idiopathic) with inflammation of right lower extremity: Secondary | ICD-10-CM | POA: Diagnosis not present

## 2023-12-07 DIAGNOSIS — L97822 Non-pressure chronic ulcer of other part of left lower leg with fat layer exposed: Secondary | ICD-10-CM | POA: Diagnosis not present

## 2023-12-07 DIAGNOSIS — M810 Age-related osteoporosis without current pathological fracture: Secondary | ICD-10-CM | POA: Diagnosis not present

## 2023-12-07 DIAGNOSIS — I87312 Chronic venous hypertension (idiopathic) with ulcer of left lower extremity: Secondary | ICD-10-CM | POA: Diagnosis not present

## 2023-12-07 DIAGNOSIS — I890 Lymphedema, not elsewhere classified: Secondary | ICD-10-CM | POA: Diagnosis not present

## 2023-12-07 DIAGNOSIS — Z48817 Encounter for surgical aftercare following surgery on the skin and subcutaneous tissue: Secondary | ICD-10-CM | POA: Diagnosis not present

## 2023-12-10 DIAGNOSIS — I87321 Chronic venous hypertension (idiopathic) with inflammation of right lower extremity: Secondary | ICD-10-CM | POA: Diagnosis not present

## 2023-12-10 DIAGNOSIS — I890 Lymphedema, not elsewhere classified: Secondary | ICD-10-CM | POA: Diagnosis not present

## 2023-12-10 DIAGNOSIS — M810 Age-related osteoporosis without current pathological fracture: Secondary | ICD-10-CM | POA: Diagnosis not present

## 2023-12-10 DIAGNOSIS — L97822 Non-pressure chronic ulcer of other part of left lower leg with fat layer exposed: Secondary | ICD-10-CM | POA: Diagnosis not present

## 2023-12-10 DIAGNOSIS — Z48817 Encounter for surgical aftercare following surgery on the skin and subcutaneous tissue: Secondary | ICD-10-CM | POA: Diagnosis not present

## 2023-12-10 DIAGNOSIS — I87312 Chronic venous hypertension (idiopathic) with ulcer of left lower extremity: Secondary | ICD-10-CM | POA: Diagnosis not present

## 2023-12-13 DIAGNOSIS — I10 Essential (primary) hypertension: Secondary | ICD-10-CM | POA: Diagnosis not present

## 2023-12-13 DIAGNOSIS — Z880 Allergy status to penicillin: Secondary | ICD-10-CM | POA: Diagnosis not present

## 2023-12-13 DIAGNOSIS — Z91018 Allergy to other foods: Secondary | ICD-10-CM | POA: Diagnosis not present

## 2023-12-13 DIAGNOSIS — Z886 Allergy status to analgesic agent status: Secondary | ICD-10-CM | POA: Diagnosis not present

## 2023-12-13 DIAGNOSIS — I87312 Chronic venous hypertension (idiopathic) with ulcer of left lower extremity: Secondary | ICD-10-CM | POA: Diagnosis not present

## 2023-12-13 DIAGNOSIS — Z87891 Personal history of nicotine dependence: Secondary | ICD-10-CM | POA: Diagnosis not present

## 2023-12-13 DIAGNOSIS — L97822 Non-pressure chronic ulcer of other part of left lower leg with fat layer exposed: Secondary | ICD-10-CM | POA: Diagnosis not present

## 2023-12-18 DIAGNOSIS — Z7185 Encounter for immunization safety counseling: Secondary | ICD-10-CM | POA: Diagnosis not present

## 2023-12-18 DIAGNOSIS — Z23 Encounter for immunization: Secondary | ICD-10-CM | POA: Diagnosis not present

## 2023-12-26 DIAGNOSIS — L97822 Non-pressure chronic ulcer of other part of left lower leg with fat layer exposed: Secondary | ICD-10-CM | POA: Diagnosis not present

## 2023-12-26 DIAGNOSIS — Z91018 Allergy to other foods: Secondary | ICD-10-CM | POA: Diagnosis not present

## 2023-12-26 DIAGNOSIS — Z87891 Personal history of nicotine dependence: Secondary | ICD-10-CM | POA: Diagnosis not present

## 2023-12-26 DIAGNOSIS — I10 Essential (primary) hypertension: Secondary | ICD-10-CM | POA: Diagnosis not present

## 2023-12-26 DIAGNOSIS — Z886 Allergy status to analgesic agent status: Secondary | ICD-10-CM | POA: Diagnosis not present

## 2023-12-26 DIAGNOSIS — Z880 Allergy status to penicillin: Secondary | ICD-10-CM | POA: Diagnosis not present

## 2023-12-26 DIAGNOSIS — I87312 Chronic venous hypertension (idiopathic) with ulcer of left lower extremity: Secondary | ICD-10-CM | POA: Diagnosis not present
